# Patient Record
Sex: Female | Born: 1935 | Race: Black or African American | Hispanic: No | Smoking: Never smoker
Health system: Southern US, Community
[De-identification: ages and names within clinical notes are randomized; demographics above are authoritative.]

## PROBLEM LIST (undated history)

## (undated) DIAGNOSIS — M1990 Unspecified osteoarthritis, unspecified site: Secondary | ICD-10-CM

## (undated) DIAGNOSIS — I10 Essential (primary) hypertension: Secondary | ICD-10-CM

## (undated) DIAGNOSIS — I951 Orthostatic hypotension: Secondary | ICD-10-CM

## (undated) DIAGNOSIS — C50912 Malignant neoplasm of unspecified site of left female breast: Secondary | ICD-10-CM

## (undated) DIAGNOSIS — E538 Deficiency of other specified B group vitamins: Secondary | ICD-10-CM

## (undated) DIAGNOSIS — N183 Chronic kidney disease, stage 3 unspecified: Secondary | ICD-10-CM

## (undated) DIAGNOSIS — D510 Vitamin B12 deficiency anemia due to intrinsic factor deficiency: Secondary | ICD-10-CM

## (undated) DIAGNOSIS — K2940 Chronic atrophic gastritis without bleeding: Secondary | ICD-10-CM

## (undated) DIAGNOSIS — K219 Gastro-esophageal reflux disease without esophagitis: Secondary | ICD-10-CM

## (undated) DIAGNOSIS — K5730 Diverticulosis of large intestine without perforation or abscess without bleeding: Secondary | ICD-10-CM

## (undated) DIAGNOSIS — E739 Lactose intolerance, unspecified: Secondary | ICD-10-CM

## (undated) DIAGNOSIS — R51 Headache: Secondary | ICD-10-CM

## (undated) DIAGNOSIS — R519 Headache, unspecified: Secondary | ICD-10-CM

## (undated) HISTORY — DX: Chronic atrophic gastritis without bleeding: K29.40

## (undated) HISTORY — DX: Essential (primary) hypertension: I10

## (undated) HISTORY — DX: Vitamin B12 deficiency anemia due to intrinsic factor deficiency: D51.0

## (undated) HISTORY — DX: Unspecified osteoarthritis, unspecified site: M19.90

## (undated) HISTORY — DX: Lactose intolerance, unspecified: E73.9

## (undated) HISTORY — DX: Diverticulosis of large intestine without perforation or abscess without bleeding: K57.30

## (undated) HISTORY — PX: CATARACT EXTRACTION W/ INTRAOCULAR LENS  IMPLANT, BILATERAL: SHX1307

## (undated) HISTORY — DX: Orthostatic hypotension: I95.1

---

## 1987-06-26 DIAGNOSIS — C50912 Malignant neoplasm of unspecified site of left female breast: Secondary | ICD-10-CM

## 1987-06-26 HISTORY — PX: MASTECTOMY: SHX3

## 1987-06-26 HISTORY — DX: Malignant neoplasm of unspecified site of left female breast: C50.912

## 2004-05-16 ENCOUNTER — Ambulatory Visit: Payer: Self-pay | Admitting: Family Medicine

## 2005-05-24 ENCOUNTER — Ambulatory Visit: Payer: Self-pay | Admitting: Family Medicine

## 2005-07-02 ENCOUNTER — Ambulatory Visit: Payer: Self-pay | Admitting: Cardiology

## 2005-07-03 ENCOUNTER — Inpatient Hospital Stay (HOSPITAL_COMMUNITY): Admission: EM | Admit: 2005-07-03 | Discharge: 2005-07-05 | Payer: Self-pay | Admitting: Emergency Medicine

## 2005-07-03 ENCOUNTER — Ambulatory Visit: Payer: Self-pay | Admitting: Internal Medicine

## 2005-07-04 ENCOUNTER — Encounter: Payer: Self-pay | Admitting: Cardiology

## 2005-07-13 ENCOUNTER — Ambulatory Visit: Payer: Self-pay | Admitting: Family Medicine

## 2005-08-14 ENCOUNTER — Ambulatory Visit: Payer: Self-pay | Admitting: Family Medicine

## 2006-01-08 ENCOUNTER — Ambulatory Visit: Payer: Self-pay | Admitting: Family Medicine

## 2006-02-27 ENCOUNTER — Ambulatory Visit: Payer: Self-pay | Admitting: Family Medicine

## 2006-03-14 ENCOUNTER — Ambulatory Visit: Payer: Self-pay | Admitting: Family Medicine

## 2006-03-26 ENCOUNTER — Ambulatory Visit: Payer: Self-pay | Admitting: Family Medicine

## 2006-05-06 ENCOUNTER — Ambulatory Visit: Payer: Self-pay | Admitting: Family Medicine

## 2006-05-06 LAB — CONVERTED CEMR LAB
ALT: 14 units/L (ref 0–40)
AST: 25 units/L (ref 0–37)
BUN: 21 mg/dL (ref 6–23)
Basophils Absolute: 0 10*3/uL (ref 0.0–0.1)
Basophils Relative: 0.1 % (ref 0.0–1.0)
Creatinine, Ser: 1.2 mg/dL (ref 0.4–1.2)
Eosinophil percent: 0 % (ref 0.0–5.0)
Glucose, Bld: 95 mg/dL (ref 70–99)
HCT: 36.5 % (ref 36.0–46.0)
Hemoglobin: 11.9 g/dL — ABNORMAL LOW (ref 12.0–15.0)
Lymphocytes Relative: 25.8 % (ref 12.0–46.0)
MCHC: 32.6 g/dL (ref 30.0–36.0)
MCV: 96.2 fL (ref 78.0–100.0)
Monocytes Absolute: 0.7 10*3/uL (ref 0.2–0.7)
Monocytes Relative: 11.7 % — ABNORMAL HIGH (ref 3.0–11.0)
Neutro Abs: 3.5 10*3/uL (ref 1.4–7.7)
Neutrophils Relative %: 62.4 % (ref 43.0–77.0)
Platelets: 329 10*3/uL (ref 150–400)
Potassium: 3.8 meq/L (ref 3.5–5.1)
RBC: 3.79 M/uL — ABNORMAL LOW (ref 3.87–5.11)
RDW: 13.2 % (ref 11.5–14.6)
TSH: 4.25 microintl units/mL (ref 0.35–5.50)
WBC: 5.7 10*3/uL (ref 4.5–10.5)

## 2007-02-26 DIAGNOSIS — K5730 Diverticulosis of large intestine without perforation or abscess without bleeding: Secondary | ICD-10-CM | POA: Insufficient documentation

## 2007-03-18 ENCOUNTER — Ambulatory Visit: Payer: Self-pay | Admitting: Family Medicine

## 2007-05-12 ENCOUNTER — Telehealth: Payer: Self-pay | Admitting: Family Medicine

## 2007-05-12 ENCOUNTER — Ambulatory Visit: Payer: Self-pay | Admitting: Family Medicine

## 2007-05-12 DIAGNOSIS — M1990 Unspecified osteoarthritis, unspecified site: Secondary | ICD-10-CM | POA: Insufficient documentation

## 2007-05-12 LAB — CONVERTED CEMR LAB
ALT: 12 units/L (ref 0–35)
AST: 18 units/L (ref 0–37)
Albumin: 3.6 g/dL (ref 3.5–5.2)
Alkaline Phosphatase: 72 units/L (ref 39–117)
BUN: 19 mg/dL (ref 6–23)
Basophils Absolute: 0 10*3/uL (ref 0.0–0.1)
Basophils Relative: 0 % (ref 0.0–1.0)
Bilirubin Urine: NEGATIVE
Bilirubin, Direct: 0.1 mg/dL (ref 0.0–0.3)
CO2: 30 meq/L (ref 19–32)
Calcium: 9.6 mg/dL (ref 8.4–10.5)
Chloride: 106 meq/L (ref 96–112)
Creatinine, Ser: 1 mg/dL (ref 0.4–1.2)
Eosinophils Absolute: 0 10*3/uL (ref 0.0–0.6)
Eosinophils Relative: 0.1 % (ref 0.0–5.0)
GFR calc Af Amer: 68 mL/min
GFR calc non Af Amer: 57 mL/min
Glucose, Bld: 107 mg/dL — ABNORMAL HIGH (ref 70–99)
Glucose, Urine, Semiquant: NEGATIVE
HCT: 33.6 % — ABNORMAL LOW (ref 36.0–46.0)
Hemoglobin: 11.6 g/dL — ABNORMAL LOW (ref 12.0–15.0)
Lymphocytes Relative: 31.2 % (ref 12.0–46.0)
MCHC: 34.6 g/dL (ref 30.0–36.0)
MCV: 95 fL (ref 78.0–100.0)
Monocytes Absolute: 0.6 10*3/uL (ref 0.2–0.7)
Monocytes Relative: 10.1 % (ref 3.0–11.0)
Neutro Abs: 3.4 10*3/uL (ref 1.4–7.7)
Neutrophils Relative %: 58.6 % (ref 43.0–77.0)
Nitrite: POSITIVE
Platelets: 265 10*3/uL (ref 150–400)
Potassium: 4.6 meq/L (ref 3.5–5.1)
RBC: 3.54 M/uL — ABNORMAL LOW (ref 3.87–5.11)
RDW: 13.6 % (ref 11.5–14.6)
Sodium: 142 meq/L (ref 135–145)
Specific Gravity, Urine: 1.025
TSH: 4.3 microintl units/mL (ref 0.35–5.50)
Total Bilirubin: 0.8 mg/dL (ref 0.3–1.2)
Total Protein: 6.6 g/dL (ref 6.0–8.3)
Urobilinogen, UA: 0.2
WBC: 5.8 10*3/uL (ref 4.5–10.5)
pH: 5.5

## 2007-10-10 ENCOUNTER — Ambulatory Visit: Payer: Self-pay | Admitting: Family Medicine

## 2007-10-10 LAB — CONVERTED CEMR LAB
Bilirubin Urine: NEGATIVE
Glucose, Urine, Semiquant: NEGATIVE
Ketones, urine, test strip: NEGATIVE
Nitrite: POSITIVE
Protein, U semiquant: 30
Specific Gravity, Urine: 1.02
Urobilinogen, UA: NEGATIVE
pH: 6

## 2007-10-31 ENCOUNTER — Ambulatory Visit: Payer: Self-pay | Admitting: Family Medicine

## 2007-11-28 ENCOUNTER — Ambulatory Visit: Payer: Self-pay | Admitting: Family Medicine

## 2007-11-28 LAB — CONVERTED CEMR LAB
Blood in Urine, dipstick: NEGATIVE
Ketones, urine, test strip: NEGATIVE
Nitrite: NEGATIVE
Specific Gravity, Urine: 1.015
Urobilinogen, UA: 0.2
WBC Urine, dipstick: NEGATIVE
pH: 6

## 2008-04-22 ENCOUNTER — Encounter: Payer: Self-pay | Admitting: Family Medicine

## 2008-05-10 ENCOUNTER — Encounter: Payer: Self-pay | Admitting: Family Medicine

## 2008-05-11 ENCOUNTER — Ambulatory Visit: Payer: Self-pay | Admitting: Family Medicine

## 2008-05-14 ENCOUNTER — Ambulatory Visit: Payer: Self-pay | Admitting: Family Medicine

## 2008-05-14 LAB — CONVERTED CEMR LAB: Ferritin: 479.8 ng/mL — ABNORMAL HIGH (ref 10.0–291.0)

## 2008-05-15 ENCOUNTER — Encounter: Payer: Self-pay | Admitting: Family Medicine

## 2008-05-17 LAB — CONVERTED CEMR LAB
Saturation Ratios: 21 % (ref 20–55)
TIBC: 236 ug/dL — ABNORMAL LOW (ref 250–470)
UIBC: 187 ug/dL

## 2008-05-18 ENCOUNTER — Ambulatory Visit: Payer: Self-pay | Admitting: Family Medicine

## 2008-05-18 DIAGNOSIS — D518 Other vitamin B12 deficiency anemias: Secondary | ICD-10-CM | POA: Insufficient documentation

## 2008-05-24 ENCOUNTER — Telehealth: Payer: Self-pay | Admitting: *Deleted

## 2008-05-26 ENCOUNTER — Ambulatory Visit: Payer: Self-pay | Admitting: Family Medicine

## 2008-06-02 ENCOUNTER — Ambulatory Visit: Payer: Self-pay | Admitting: Family Medicine

## 2008-07-01 ENCOUNTER — Ambulatory Visit: Payer: Self-pay | Admitting: Family Medicine

## 2008-07-29 ENCOUNTER — Ambulatory Visit: Payer: Self-pay | Admitting: Family Medicine

## 2008-09-08 ENCOUNTER — Ambulatory Visit: Payer: Self-pay | Admitting: Family Medicine

## 2008-10-14 ENCOUNTER — Ambulatory Visit: Payer: Self-pay | Admitting: Family Medicine

## 2008-11-15 ENCOUNTER — Ambulatory Visit: Payer: Self-pay | Admitting: Family Medicine

## 2008-12-20 ENCOUNTER — Ambulatory Visit: Payer: Self-pay | Admitting: Family Medicine

## 2009-01-19 ENCOUNTER — Ambulatory Visit: Payer: Self-pay | Admitting: Family Medicine

## 2009-01-31 ENCOUNTER — Ambulatory Visit: Payer: Self-pay | Admitting: Family Medicine

## 2009-02-16 ENCOUNTER — Ambulatory Visit: Payer: Self-pay | Admitting: Family Medicine

## 2009-02-22 ENCOUNTER — Ambulatory Visit: Payer: Self-pay | Admitting: Family Medicine

## 2009-03-18 ENCOUNTER — Ambulatory Visit: Payer: Self-pay | Admitting: Family Medicine

## 2009-04-25 ENCOUNTER — Ambulatory Visit: Payer: Self-pay | Admitting: Family Medicine

## 2009-05-18 ENCOUNTER — Encounter: Payer: Self-pay | Admitting: *Deleted

## 2009-05-26 ENCOUNTER — Ambulatory Visit: Payer: Self-pay | Admitting: Family Medicine

## 2009-06-14 ENCOUNTER — Ambulatory Visit: Payer: Self-pay | Admitting: Family Medicine

## 2009-06-14 LAB — CONVERTED CEMR LAB
Bilirubin Urine: NEGATIVE
Nitrite: POSITIVE
Specific Gravity, Urine: 1.025

## 2009-06-15 LAB — CONVERTED CEMR LAB
ALT: 14 units/L (ref 0–35)
AST: 21 units/L (ref 0–37)
Alkaline Phosphatase: 64 units/L (ref 39–117)
Basophils Relative: 0.1 % (ref 0.0–3.0)
Bilirubin, Direct: 0.1 mg/dL (ref 0.0–0.3)
Chloride: 106 meq/L (ref 96–112)
Creatinine, Ser: 1.1 mg/dL (ref 0.4–1.2)
Eosinophils Relative: 0 % (ref 0.0–5.0)
GFR calc non Af Amer: 60.85 mL/min (ref >60)
Lymphocytes Relative: 31.7 % (ref 12.0–46.0)
MCV: 93.8 fL (ref 78.0–100.0)
Monocytes Absolute: 0.6 10*3/uL (ref 0.1–1.0)
Monocytes Relative: 10.7 % (ref 3.0–12.0)
Neutrophils Relative %: 57.5 % (ref 43.0–77.0)
Platelets: 231 10*3/uL (ref 150.0–400.0)
RBC: 3.67 M/uL — ABNORMAL LOW (ref 3.87–5.11)
Saturation Ratios: 28 % (ref 20.0–50.0)
Total Bilirubin: 1 mg/dL (ref 0.3–1.2)
Total Protein: 6.8 g/dL (ref 6.0–8.3)
Transferrin: 186.1 mg/dL — ABNORMAL LOW (ref 212.0–360.0)
WBC: 5.9 10*3/uL (ref 4.5–10.5)

## 2009-06-27 ENCOUNTER — Ambulatory Visit: Payer: Self-pay | Admitting: Family Medicine

## 2009-07-28 ENCOUNTER — Ambulatory Visit: Payer: Self-pay | Admitting: Family Medicine

## 2009-08-25 ENCOUNTER — Ambulatory Visit: Payer: Self-pay | Admitting: Family Medicine

## 2009-09-26 ENCOUNTER — Ambulatory Visit: Payer: Self-pay | Admitting: Family Medicine

## 2009-10-18 ENCOUNTER — Ambulatory Visit: Payer: Self-pay | Admitting: Family Medicine

## 2009-10-24 ENCOUNTER — Encounter: Payer: Self-pay | Admitting: Family Medicine

## 2009-10-24 ENCOUNTER — Encounter: Payer: Self-pay | Admitting: *Deleted

## 2009-11-16 ENCOUNTER — Encounter: Payer: Self-pay | Admitting: *Deleted

## 2009-11-16 LAB — HM MAMMOGRAPHY

## 2009-11-18 ENCOUNTER — Ambulatory Visit: Payer: Self-pay | Admitting: Family Medicine

## 2009-12-16 ENCOUNTER — Ambulatory Visit: Payer: Self-pay | Admitting: Family Medicine

## 2010-01-13 ENCOUNTER — Ambulatory Visit: Payer: Self-pay | Admitting: Family Medicine

## 2010-02-09 ENCOUNTER — Ambulatory Visit: Payer: Self-pay | Admitting: Family Medicine

## 2010-03-14 ENCOUNTER — Ambulatory Visit: Payer: Self-pay | Admitting: Family Medicine

## 2010-04-11 ENCOUNTER — Ambulatory Visit: Payer: Self-pay | Admitting: Family Medicine

## 2010-04-11 DIAGNOSIS — L501 Idiopathic urticaria: Secondary | ICD-10-CM | POA: Insufficient documentation

## 2010-04-11 LAB — CONVERTED CEMR LAB
Ketones, urine, test strip: NEGATIVE
Nitrite: POSITIVE
Protein, U semiquant: 30
Specific Gravity, Urine: 1.02
pH: 6

## 2010-05-12 ENCOUNTER — Ambulatory Visit: Payer: Self-pay | Admitting: Family Medicine

## 2010-06-09 ENCOUNTER — Ambulatory Visit: Payer: Self-pay | Admitting: Family Medicine

## 2010-07-11 ENCOUNTER — Encounter: Payer: Self-pay | Admitting: Family Medicine

## 2010-07-11 ENCOUNTER — Ambulatory Visit
Admission: RE | Admit: 2010-07-11 | Discharge: 2010-07-11 | Payer: Self-pay | Source: Home / Self Care | Attending: Family Medicine | Admitting: Family Medicine

## 2010-07-11 ENCOUNTER — Other Ambulatory Visit: Payer: Self-pay | Admitting: Family Medicine

## 2010-07-11 DIAGNOSIS — R1310 Dysphagia, unspecified: Secondary | ICD-10-CM | POA: Insufficient documentation

## 2010-07-11 LAB — CBC WITH DIFFERENTIAL/PLATELET
Basophils Absolute: 0 10*3/uL (ref 0.0–0.1)
Basophils Relative: 0.2 % (ref 0.0–3.0)
Eosinophils Absolute: 0 10*3/uL (ref 0.0–0.7)
Eosinophils Relative: 0 % (ref 0.0–5.0)
HCT: 34.6 % — ABNORMAL LOW (ref 36.0–46.0)
Hemoglobin: 11.9 g/dL — ABNORMAL LOW (ref 12.0–15.0)
Lymphocytes Relative: 30.4 % (ref 12.0–46.0)
Lymphs Abs: 1.5 10*3/uL (ref 0.7–4.0)
MCHC: 34.3 g/dL (ref 30.0–36.0)
MCV: 92 fl (ref 78.0–100.0)
Monocytes Absolute: 0.5 10*3/uL (ref 0.1–1.0)
Monocytes Relative: 10.1 % (ref 3.0–12.0)
Neutro Abs: 2.9 10*3/uL (ref 1.4–7.7)
Neutrophils Relative %: 59.3 % (ref 43.0–77.0)
Platelets: 258 10*3/uL (ref 150.0–400.0)
RBC: 3.76 Mil/uL — ABNORMAL LOW (ref 3.87–5.11)
RDW: 14.5 % (ref 11.5–14.6)
WBC: 4.9 10*3/uL (ref 4.5–10.5)

## 2010-07-11 LAB — BASIC METABOLIC PANEL
BUN: 21 mg/dL (ref 6–23)
CO2: 30 mEq/L (ref 19–32)
Calcium: 9.3 mg/dL (ref 8.4–10.5)
Chloride: 107 mEq/L (ref 96–112)
Creatinine, Ser: 1.2 mg/dL (ref 0.4–1.2)
GFR: 57.66 mL/min — ABNORMAL LOW (ref >60.00)
Glucose, Bld: 78 mg/dL (ref 70–99)
Potassium: 4.1 mEq/L (ref 3.5–5.1)
Sodium: 145 mEq/L (ref 135–145)

## 2010-07-11 LAB — CONVERTED CEMR LAB
Bilirubin Urine: NEGATIVE
Glucose, Urine, Semiquant: NEGATIVE
Urobilinogen, UA: 0.2
pH: 5

## 2010-07-11 LAB — HEPATIC FUNCTION PANEL
ALT: 15 U/L (ref 0–35)
AST: 20 U/L (ref 0–37)
Albumin: 3.6 g/dL (ref 3.5–5.2)
Alkaline Phosphatase: 77 U/L (ref 39–117)
Bilirubin, Direct: 0.1 mg/dL (ref 0.0–0.3)
Total Bilirubin: 0.9 mg/dL (ref 0.3–1.2)
Total Protein: 6.5 g/dL (ref 6.0–8.3)

## 2010-07-11 LAB — B12 AND FOLATE PANEL
Folate: >24.8 ng/mL (ref >5.9)
Vitamin B-12: >1500 pg/mL — ABNORMAL HIGH (ref 211–911)

## 2010-07-11 LAB — TSH: TSH: 4.91 u[IU]/mL (ref 0.35–5.50)

## 2010-07-12 ENCOUNTER — Encounter: Payer: Self-pay | Admitting: Gastroenterology

## 2010-07-12 ENCOUNTER — Telehealth: Payer: Self-pay | Admitting: Family Medicine

## 2010-07-17 ENCOUNTER — Ambulatory Visit
Admission: RE | Admit: 2010-07-17 | Discharge: 2010-07-17 | Payer: Self-pay | Source: Home / Self Care | Attending: Gastroenterology | Admitting: Gastroenterology

## 2010-07-17 ENCOUNTER — Encounter: Payer: Self-pay | Admitting: Gastroenterology

## 2010-07-17 DIAGNOSIS — H409 Unspecified glaucoma: Secondary | ICD-10-CM | POA: Insufficient documentation

## 2010-07-17 DIAGNOSIS — R1319 Other dysphagia: Secondary | ICD-10-CM | POA: Insufficient documentation

## 2010-07-17 DIAGNOSIS — K219 Gastro-esophageal reflux disease without esophagitis: Secondary | ICD-10-CM | POA: Insufficient documentation

## 2010-07-23 LAB — CONVERTED CEMR LAB
ALT: 12 units/L (ref 0–35)
Albumin: 3.5 g/dL (ref 3.5–5.2)
Basophils Absolute: 0 10*3/uL (ref 0.0–0.1)
Basophils Relative: 0.1 % (ref 0.0–3.0)
Bilirubin, Direct: 0.1 mg/dL (ref 0.0–0.3)
Calcium: 8.9 mg/dL (ref 8.4–10.5)
Creatinine, Ser: 1.2 mg/dL (ref 0.4–1.2)
Eosinophils Absolute: 0 10*3/uL (ref 0.0–0.7)
GFR calc Af Amer: 55 mL/min
GFR calc non Af Amer: 46 mL/min
Hemoglobin: 10.7 g/dL — ABNORMAL LOW (ref 12.0–15.0)
MCHC: 33.6 g/dL (ref 30.0–36.0)
MCV: 95.6 fL (ref 78.0–100.0)
Monocytes Absolute: 0.5 10*3/uL (ref 0.1–1.0)
Neutro Abs: 2.8 10*3/uL (ref 1.4–7.7)
Nitrite: NEGATIVE
RBC: 3.32 M/uL — ABNORMAL LOW (ref 3.87–5.11)
RDW: 14.7 % — ABNORMAL HIGH (ref 11.5–14.6)
Sodium: 144 meq/L (ref 135–145)
Specific Gravity, Urine: 1.025
Total Bilirubin: 1 mg/dL (ref 0.3–1.2)
Urobilinogen, UA: 0.2

## 2010-07-26 ENCOUNTER — Encounter (AMBULATORY_SURGERY_CENTER): Payer: Medicare Other | Admitting: Gastroenterology

## 2010-07-26 ENCOUNTER — Encounter: Payer: Self-pay | Admitting: Gastroenterology

## 2010-07-26 DIAGNOSIS — K219 Gastro-esophageal reflux disease without esophagitis: Secondary | ICD-10-CM

## 2010-07-26 DIAGNOSIS — K2940 Chronic atrophic gastritis without bleeding: Secondary | ICD-10-CM

## 2010-07-26 DIAGNOSIS — R1319 Other dysphagia: Secondary | ICD-10-CM

## 2010-07-27 NOTE — Progress Notes (Signed)
Summary: b12 rx  Phone Note Refill Request Message from:  Patient  Refills Requested: Medication #1:  CYANOCOBALAMIN 1000 MCG/ML SOLN 1 cc once a month    Prescriptions: CYANOCOBALAMIN 1000 MCG/ML SOLN (CYANOCOBALAMIN) 1 cc once a month  #30 cc x 3   Entered by:   Kern Reap CMA (AAMA)   Authorized by:   Roderick Pee MD   Signed by:   Kern Reap CMA (AAMA) on 07/12/2010   Method used:   Electronically to        [facility] [phone]* (retail)       [address]       [place], [place]  [address]       Ph: [phone]       Fax: [phone]   RxID:   [id number]

## 2010-07-27 NOTE — Assessment & Plan Note (Signed)
Summary: B-12 INJ/CJR  Nurse Visit   Allergies: No Known Drug Allergies  Medication Administration  Injection # 1:    Medication: Vit B12 1000 mcg    Diagnosis: ANEMIA, B12 DEFICIENCY (ICD-281.1)    Route: IM    Site: RUOQ gluteus    Exp Date: 07/27/2011    Lot #: [id number]    Mfr: American Regent    Patient tolerated injection without complications    Given by: Kern Reap CMA Duncan Dull) (January 13, 2010 12:14 PM)  Orders Added: 1)  Vit B12 1000 mcg [J3420] 2)  Admin of Therapeutic Inj  intramuscular or subcutaneous [[address]]

## 2010-07-27 NOTE — Assessment & Plan Note (Signed)
Summary: B-12INJ/PT WILL COME IN @11 :00AM/RCD  Nurse Visit   Allergies: No Known Drug Allergies  Medication Administration  Injection # 1:    Medication: Vit B12 1000 mcg    Diagnosis: ANEMIA, B12 DEFICIENCY (ICD-281.1)    Route: IM    Site: RUOQ gluteus    Exp Date: 0647    Lot #: 02/24/11    Mfr: American Regent    Patient tolerated injection without complications    Given by: Kern Reap CMA (AAMA) (Nov 18, 2009 12:30 PM)  Orders Added: 1)  Vit B12 1000 mcg [J3420] 2)  Admin of Therapeutic Inj  intramuscular or subcutaneous [[address]]

## 2010-07-27 NOTE — Assessment & Plan Note (Signed)
Summary: B-12//ALP pt will come in around 11am/njr  Nurse Visit   Allergies: No Known Drug Allergies  Medication Administration  Injection # 1:    Medication: Vit B12 1000 mcg    Diagnosis: ANEMIA, B12 DEFICIENCY (ICD-281.1)    Route: IM    Site: RUOQ gluteus    Exp Date: 02/24/2011    Lot #: [id number]    Mfr: American Regent    Patient tolerated injection without complications    Given by: Kern Reap CMA (AAMA) (September 26, 2009 12:05 PM)  Orders Added: 1)  Vit B12 1000 mcg [J3420] 2)  Admin of Therapeutic Inj  intramuscular or subcutaneous [82956]

## 2010-07-27 NOTE — Assessment & Plan Note (Signed)
Summary: EPISODES OF FOOD GETTING STUCK IN ESOPHAGUS/YF   History of Present Illness Visit Type: Initial Consult Primary GI MD: Elie Goody MD [facility] Primary Provider: Kelle Darting, MD Requesting Provider: Suzzanne Cloud, MD Chief Complaint: dysphagia,to solids and liquids x 1 year, more regular in the past 2-3 months History of Present Illness:   This is an 77 year old female here today with her daughter, who relates a one-year history of postprandial regurgitation, and a vague description of solid and liquid dysphasia that is associated with regurgitation. Her symptoms have worsened over the past 2-3 months. She relates she had heartburn in the past, but none recently. She takes ranitidine on an as-needed basis. She notes about 10 pound weight loss over the past year.   GI Review of Systems    Reports acid reflux, belching, dysphagia with liquids, dysphagia with solids, and  weight loss.   Weight loss of 10 pounds   Denies abdominal pain, bloating, chest pain, heartburn, loss of appetite, nausea, vomiting, vomiting blood, and  weight gain.      Reports diverticulosis.     Denies anal fissure, black tarry stools, change in bowel habit, constipation, diarrhea, fecal incontinence, heme positive stool, hemorrhoids, irritable bowel syndrome, jaundice, light color stool, liver problems, rectal bleeding, and  rectal pain.   Current Medications (verified): 1)  Multivitamins   Tabs (Multiple Vitamin) .... Once Daily 2)  Ranitidine Hcl 150 Mg  Caps (Ranitidine Hcl) .... Prn 3)  Travatan Z 0.004 % Soln (Travoprost) .... One Drop in Each Eye Once Daily 4)  Cyanocobalamin 1000 Mcg/ml Soln (Cyanocobalamin) .Marland Kitchen.. 1 Cc Once A Month 5)  Iron 325 (65 Fe) Mg Tabs (Ferrous Sulfate) .... Take One Tab Once Daily 6)  Calcarb 600 1500 Mg Tabs (Calcium Carbonate) .... Take 1 Tablet By Mouth Once Daily  Allergies (verified): No Known Drug Allergies  Past History:  Past Medical History: Diverticulosis,  colon Hypertension breast cancer, left total mastectomy 1989 Osteoarthritis Glaucoma  Past Surgical History: Reviewed history from 02/26/2007 and no changes required. Mastectomy CB x4  Social History: Retired Married Never Smoked Alcohol use-no Drug use-no Daily Caffeine Use  Review of Systems       The patient complains of allergy/sinus, arthritis/joint pain, back pain, sleeping problems, urination - excessive, and urination changes/pain.         The pertinent positives and negatives are noted as above and in the HPI. All other ROS were reviewed and were negative.  Vital Signs:  Patient profile:   77 year old female Height:      60 inches Weight:      120.13 pounds BMI:     23.55 Pulse rate:   68 / minute Pulse rhythm:   regular BP sitting:   160 / 82  (right arm) Cuff size:   regular  Vitals Entered By: June McMurray CMA Duncan Dull) (July 17, 2010 2:36 PM)  Physical Exam  General:  Well developed, well nourished, no acute distress. Head:  Normocephalic and atraumatic. Eyes:  PERRLA, no icterus. Ears:  Normal auditory acuity. Mouth:  No deformity or lesions, dentition normal. Lungs:  Clear throughout to auscultation. Heart:  Regular rate and rhythm; no murmurs, rubs,  or bruits. Abdomen:  Soft, nontender and nondistended. No masses, hepatosplenomegaly or hernias noted. Normal bowel sounds. Pulses:  Normal pulses noted. Extremities:  No clubbing, cyanosis, edema or deformities noted. Neurologic:  Alert and  oriented x4;  grossly normal neurologically. Cervical Nodes:  No significant cervical adenopathy. Inguinal Nodes:  No significant inguinal adenopathy. Psych:  Alert and cooperative. Normal mood and affect.  Impression & Recommendations:  Problem # 1:  GERD (ICD-530.81) GERD with significant regurgitation and weight loss. Rule out an UGI neoplasm, an esophageal motility disorder or an esophageal stricture. The risks, benefits and alternatives to endoscopy  with possible biopsy and possible dilation were discussed with the patient and they consent to proceed. The procedure will be scheduled electively. Begin omeprazole 20 mg q.a.m., along with standard antireflux measures and discontinued ranitidine. Orders: EGD SAV (EGD SAV)  Problem # 2:  OTHER DYSPHAGIA (ICD-787.29) See above. Vague symptoms could be related to regurgitation. Orders: EGD SAV (EGD SAV)  Problem # 3:  ANEMIA, B12 DEFICIENCY (ICD-281.1) Monthly B12 injections. She is also being treated with iron. Further followup with Dr. Tawanna Cooler.   Patient Instructions: 1)  Stop ranitidine and start omeprazole 20mg  one tablet by mouth once daily that has been sent to your pharmacy.  2)  Upper Endoscopy with Dilatation brochure given.  3)  Copy sent to : Kelle Darting, MD 4)  The medication list was reviewed and reconciled.  All changed / newly prescribed medications were explained.  A complete medication list was provided to the patient / caregiver.  Prescriptions: OMEPRAZOLE 20 MG CPDR (OMEPRAZOLE) one tablet by mouth once daily  #30 x 11   Entered by:   Christie Nottingham CMA (AAMA)   Authorized by:   Meryl Dare MD [facility]   Signed by:   Christie Nottingham CMA Duncan Dull) on 07/17/2010   Method used:   Electronically to        [facility] [phone]* (retail)       [address]       [place], [place]  [address]       Ph: [phone]       Fax: [phone]   RxID:   [id number]

## 2010-07-27 NOTE — Assessment & Plan Note (Signed)
Summary: b12//ccm  Nurse Visit   Allergies: No Known Drug Allergies  Medication Administration  Injection # 1:    Medication: Vit B12 1000 mcg    Diagnosis: ANEMIA, B12 DEFICIENCY (ICD-281.1)    Route: IM    Site: RUOQ gluteus    Exp Date: 07/27/2011    Lot #: [id number]    Mfr: American Regent    Patient tolerated injection without complications    Given by: Kern Reap CMA (AAMA) (February 09, 2010 10:21 AM)  Orders Added: 1)  Vit B12 1000 mcg [J3420] 2)  Admin of Therapeutic Inj  intramuscular or subcutaneous [[id number]]

## 2010-07-27 NOTE — Assessment & Plan Note (Signed)
Summary: b12 inj//ccm  Nurse Visit   Allergies: No Known Drug Allergies  Medication Administration  Injection # 1:    Medication: Vit B12 1000 mcg    Diagnosis: ANEMIA, B12 DEFICIENCY (ICD-281.1)    Route: IM    Site: RUOQ gluteus    Exp Date: 12/24/2011    Lot #: [id number]    Mfr: American Regent    Patient tolerated injection without complications    Given by: Kern Reap CMA (AAMA) (June 09, 2010 11:07 AM)  Orders Added: 1)  Vit B12 1000 mcg [J3420] 2)  Admin of Therapeutic Inj  intramuscular or subcutaneous [62130]

## 2010-07-27 NOTE — Assessment & Plan Note (Signed)
Summary: B-12 INJ/CJR  PT COMING IN AT 11:15AM  Nurse Visit   Allergies: No Known Drug Allergies  Medication Administration  Injection # 1:    Medication: Vit B12 1000 mcg    Diagnosis: ANEMIA, B12 DEFICIENCY (ICD-281.1)    Route: IM    Site: R deltoid    Exp Date: 01/24/2011    Lot #: [id number]    Mfr: American Regent    Patient tolerated injection without complications    Given by: Kern Reap CMA (AAMA) (July 28, 2009 11:11 AM)  Orders Added: 1)  Vit B12 1000 mcg [J3420] 2)  Admin of Therapeutic Inj  intramuscular or subcutaneous [46962]

## 2010-07-27 NOTE — Assessment & Plan Note (Signed)
Summary: CPX/PT FASTING/RCD/pt rescd from bump//ccm also b12 inj/njr   Vital Signs:  Patient profile:   77 year old female Height:      60.25 inches Weight:      120 pounds BMI:     23.33 Temp:     98.1 degrees F oral BP sitting:   124 / 80  (left arm) Cuff size:   regular  Vitals Entered By: Kern Reap CMA Duncan Dull) (July 11, 2010 9:40 AM) CC: wellness exam Is Patient Diabetic? No Pain Assessment Patient in pain? no        CC:  wellness exam.  History of Present Illness: Veronica Phillips is an 77 year old, married female, nonsmoker, who comes in today for Medicare wellness examination.  She's always been in excellent, health.  She's had no chronic health problems except for vitamin B12 deficiency, and some glaucoma.  Review of systems negative except for the last, year.  She's had episodes her she said difficulty swallowing food gets stuck in her lower esophagus, and she said her throw up to get rid of it.  Advised a soft diet, and we will get a GI consult.  Tetanus 2009, seasonal flu 2011, Pneumovax 2005 x 2, information given on shingles. Here for Medicare AWV:  1.   Risk factors based on Past M, S, F history:......reviewed no changes except for dysphasia 2.   Physical Activities: sedentary 3.   Depression/mood: good mood.  No depression 4.   Hearing: normal 5.   ADL's: functions independently 6.   Fall Risk: reviewed the none identified 7.   Home Safety: no guns in the house 8.   Height, weight, &visual acuity:height weight, normal.  Vision normal 9.   Counseling: a soft diet.  GI consult 10.   Labs ordered based on risk factors: done today 11.           Referral Coordination...GI consult 12.           Care Plan.........continue current medication 13.            Cognitive Assessment ........daughter has healthcare and financial power-of-attorney  Allergies (verified): No Known Drug Allergies  Past History:  Past medical, surgical, family and social histories  (including risk factors) reviewed, and no changes noted (except as noted below).  Past Medical History: Reviewed history from 05/12/2007 and no changes required. Diverticulosis, colon Hypertension breast cancer, left total mastectomy 1989 Osteoarthritis  Past Surgical History: Reviewed history from 02/26/2007 and no changes required. Mastectomy CB x4  Family History: Reviewed history from 02/26/2007 and no changes required. Family History Hypertension Family History Lung cancer Family History of Stroke M 1st degree relative <50 Family History of Cardiovascular disorder  Social History: Reviewed history from 02/26/2007 and no changes required. Retired Married Never Smoked Alcohol use-no Drug use-no  Review of Systems      See HPI  Physical Exam  General:  Well-developed,well-nourished,in no acute distress; alert,appropriate and cooperative throughout examination Head:  Normocephalic and atraumatic without obvious abnormalities. No apparent alopecia or balding. Eyes:  No corneal or conjunctival inflammation noted. EOMI. Perrla. Funduscopic exam benign, without hemorrhages, exudates or papilledema. Vision grossly normal. Ears:  External ear exam shows no significant lesions or deformities.  Otoscopic examination reveals clear canals, tympanic membranes are intact bilaterally without bulging, retraction, inflammation or discharge. Hearing is grossly normal bilaterally. Nose:  External nasal examination shows no deformity or inflammation. Nasal mucosa are pink and moist without lesions or exudates. Mouth:  Oral mucosa and oropharynx without lesions or  exudates.  Teeth in good repair. Neck:  No deformities, masses, or tenderness noted. Chest Wall:  No deformities, masses, or tenderness noted. Breasts:  right breast normal scar left from a mastectomy.  No recurrence Lungs:  Normal respiratory effort, chest expands symmetrically. Lungs are clear to auscultation, no crackles or  wheezes. Heart:  Normal rate and regular rhythm. S1 and S2 normal without gallop, murmur, click, rub or other extra sounds. Abdomen:  Bowel sounds positive,abdomen soft and non-tender without masses, organomegaly or hernias noted. Msk:  No deformity or scoliosis noted of thoracic or lumbar spine.   Pulses:  R and L carotid,radial,femoral,dorsalis pedis and posterior tibial pulses are full and equal bilaterally Extremities:  No clubbing, cyanosis, edema, or deformity noted with normal full range of motion of all joints.   Neurologic:  No cranial nerve deficits noted. Station and gait are normal. Plantar reflexes are down-going bilaterally. DTRs are symmetrical throughout. Sensory, motor and coordinative functions appear intact. Skin:  vitiligo Cervical Nodes:  No lymphadenopathy noted Axillary Nodes:  No palpable lymphadenopathy Inguinal Nodes:  No significant adenopathy Psych:  Cognition and judgment appear intact. Alert and cooperative with normal attention span and concentration. No apparent delusions, illusions, hallucinations   Impression & Recommendations:  Problem # 1:  ANEMIA, B12 DEFICIENCY (ICD-281.1) Assessment Improved  Her updated medication list for this problem includes:    Cyanocobalamin 1000 Mcg/ml Soln (Cyanocobalamin) .Marland Kitchen... 1 cc once a month    Iron 325 (65 Fe) Mg Tabs (Ferrous sulfate) .Marland Kitchen... Take one tab once daily  Orders: Prescription Created Electronically [phone]) Medicare -1st Annual Wellness Visit [phone]) Urinalysis-dipstick only (Medicare patient) [phone]) EKG w/ Interpretation (93000) Vit B12 1000 mcg (J3420) Admin of Therapeutic Inj  intramuscular or subcutaneous (62130)  Problem # 2:  HEALTH SCREENING (ICD-V70.0) Assessment: Unchanged  Orders: Venipuncture (86578) Prescription Created Electronically [phone]) Medicare -1st Annual Wellness Visit [phone]) Urinalysis-dipstick only (Medicare patient) (13244WN) EKG w/ Interpretation (93000) Specimen  Handling (02725) TLB-BMP (Basic Metabolic Panel-BMET) (80048-METABOL) TLB-CBC Platelet - w/Differential (85025-CBCD) TLB-Hepatic/Liver Function Pnl (80076-HEPATIC) TLB-TSH (Thyroid Stimulating Hormone) (84443-TSH) TLB-B12 + Folate Pnl (36644_03474-Q59/DGL)  Complete Medication List: 1)  Multivitamins Tabs (Multiple vitamin) .... Once daily 2)  Ranitidine Hcl 150 Mg Caps (Ranitidine hcl) .... Prn 3)  Travatan Z 0.004 % Soln (Travoprost) .... One drop in each eye once daily 4)  Cyanocobalamin 1000 Mcg/ml Soln (Cyanocobalamin) .Marland Kitchen.. 1 cc once a month 5)  Iron 325 (65 Fe) Mg Tabs (Ferrous sulfate) .... Take one tab once daily  Other Orders: Gastroenterology Referral (GI)  Patient Instructions: 1)  stay on a soft diet be sure to drink lots of water when you eat.  We will give to set up for a GI consult 2)  Please schedule a follow-up appointment in 1 year. Prescriptions: CYANOCOBALAMIN 1000 MCG/ML SOLN (CYANOCOBALAMIN) 1 cc once a month  #30 cc x 1   Entered and Authorized by:   Roderick Pee MD   Signed by:   Roderick Pee MD on 07/11/2010   Method used:   Electronically to        [facility]  [address] [phone]* (retail)       [address]       [place], [place]  [address]       Ph: [phone]       Fax: [phone]   RxID:   [id number]    Medication Administration  Injection # 1:  Medication: Vit B12 1000 mcg    Diagnosis: ANEMIA, B12 DEFICIENCY (ICD-281.1)    Route: IM    Site: RUOQ gluteus    Exp Date: 03/25/2012    Lot #: [id number]    Mfr: American Regent    Patient tolerated injection without complications    Given by: Kern Reap CMA (AAMA) (July 11, 2010 10:45 AM)  Orders Added: 1)  Venipuncture [04540] 2)  Prescription Created Electronically [G8553] 3)  Medicare -1st Annual Wellness Visit [G0438] 4)  Urinalysis-dipstick only (Medicare patient) [81003QW] 5)  EKG w/ Interpretation [93000] 6)  Vit B12 1000 mcg [J3420] 7)  Admin of  Therapeutic Inj  intramuscular or subcutaneous [96372] 8)  Specimen Handling [99000] 9)  Gastroenterology Referral [GI] 10)  TLB-BMP (Basic Metabolic Panel-BMET) [80048-METABOL] 11)  TLB-CBC Platelet - w/Differential [85025-CBCD] 12)  TLB-Hepatic/Liver Function Pnl [80076-HEPATIC] 13)  TLB-TSH (Thyroid Stimulating Hormone) [84443-TSH] 14)  TLB-B12 + Folate Pnl [82746_82607-B12/FOL]      Laboratory Results   Urine Tests    Routine Urinalysis   Color: yellow Appearance: Clear Glucose: negative   (Normal Range: Negative) Bilirubin: negative   (Normal Range: Negative) Ketone: 1+   (Normal Range: Negative) Spec. Gravity: 1.025   (Normal Range: 1.003-1.035) Blood: 2+   (Normal Range: Negative) pH: 5.0   (Normal Range: 5.0-8.0) Protein: 1+   (Normal Range: Negative) Urobilinogen: 0.2   (Normal Range: 0-1) Nitrite: negative   (Normal Range: Negative) Leukocyte Esterace: 1+   (Normal Range: Negative)    Comments: Rita Ohara  July 11, 2010 12:03 PM

## 2010-07-27 NOTE — Assessment & Plan Note (Signed)
Summary: B12 inj/mm/pt rescd//ccm  Nurse Visit   Review of Systems       Flu Vaccine Consent Questions     Do you have a history of severe allergic reactions to this vaccine? no    Any prior history of allergic reactions to egg and/or gelatin? no    Do you have a sensitivity to the preservative Thimersol? no    Do you have a past history of Guillan-Barre Syndrome? no    Do you currently have an acute febrile illness? no    Have you ever had a severe reaction to latex? no    Vaccine information given and explained to patient? yes    Are you currently pregnant? no    Lot Number:[id number]   Exp Date:12/23/2010   Site Given  Left Deltoid IM    Allergies: No Known Drug Allergies  Medication Administration  Injection # 1:    Medication: Vit B12 1000 mcg    Diagnosis: ANEMIA, B12 DEFICIENCY (ICD-281.1)    Route: IM    Site: RUOQ gluteus    Exp Date: 12/24/2011    Lot #: [id number]    Mfr: American Regent    Patient tolerated injection without complications    Given by: Kern Reap CMA (AAMA) (March 14, 2010 11:13 AM)  Orders Added: 1)  Flu Vaccine 33yrs + MEDICARE PATIENTS [Q2039] 2)  Administration Flu vaccine - MCR [G0008] 3)  Vit B12 1000 mcg [J3420] 4)  Admin of Therapeutic Inj  intramuscular or subcutaneous [[address]]

## 2010-07-27 NOTE — Letter (Signed)
Summary: EGD Instructions  [name]  [address] [place], [place] [address]   Phone: [phone]  Fax: [phone]       Veronica Phillips    16-Jun-1923    MRN: [id number]       Procedure Day Dorna Bloom: Wednesday February 1st, 2012     Arrival Time: 3:00pm     Procedure Time: 4:00pm     Location of Procedure:                    _ x _ [facility] (4th Floor)    PREPARATION FOR ENDOSCOPY   On 07/26/10 THE DAY OF THE PROCEDURE:  1.   No solid foods, milk or milk products are allowed after midnight the night before your procedure.  2.   Do not drink anything colored red or purple.  Avoid juices with pulp.  No orange juice.  3.  You may drink clear liquids until 2:00pm, which is 2 hours before your procedure.                                                                                                CLEAR LIQUIDS INCLUDE: Water Jello Ice Popsicles Tea (sugar ok, no milk/cream) Powdered fruit flavored drinks Coffee (sugar ok, no milk/cream) Gatorade Juice: apple, white grape, white cranberry  Lemonade Clear bullion, consomm, broth Carbonated beverages (any kind) Strained chicken noodle soup Hard Candy   MEDICATION INSTRUCTIONS  Unless otherwise instructed, you should take regular prescription medications with a small sip of water as early as possible the morning of your procedure.  STOP IRON 5 DAYS BEFORE PROCEDURE!       OTHER INSTRUCTIONS  You will need a responsible adult at least 75 years of age to accompany you and drive you home.   This person must remain in the waiting room during your procedure.  Wear loose fitting clothing that is easily removed.  Leave jewelry and other valuables at home.  However, you may wish to bring a book to read or an iPod/MP3 player to listen to music as you wait for your procedure to start.  Remove all body piercing jewelry and leave at home.  Total time from sign-in until discharge is approximately  2-3 hours.  You should go home directly after your procedure and rest.  You can resume normal activities the day after your procedure.  The day of your procedure you should not:   Drive   Make legal decisions   Operate machinery   Drink alcohol   Return to work  You will receive specific instructions about eating, activities and medications before you leave.    The above instructions have been reviewed and explained to me by   _______________________    I fully understand and can verbalize these instructions _____________________________ Date _________

## 2010-07-27 NOTE — Assessment & Plan Note (Signed)
Summary: B12 INJ//SLM  Nurse Visit   Allergies: No Known Drug Allergies  Medication Administration  Injection # 1:    Medication: Vit B12 1000 mcg    Diagnosis: ANEMIA, B12 DEFICIENCY (ICD-281.1)    Route: IM    Site: RUOQ gluteus    Exp Date: 01/24/2011    Lot #: [id number]    Mfr: American Regent    Patient tolerated injection without complications    Given by: Kern Reap CMA (AAMA) (June 27, 2009 11:44 AM)  Orders Added: 1)  Vit B12 1000 mcg [J3420] 2)  Admin of Therapeutic Inj  intramuscular or subcutaneous [84166]

## 2010-07-27 NOTE — Assessment & Plan Note (Signed)
Summary: b12 inj/njr  Nurse Visit   Allergies: No Known Drug Allergies  Medication Administration  Injection # 1:    Medication: Vit B12 1000 mcg    Diagnosis: ANEMIA, B12 DEFICIENCY (ICD-281.1)    Route: IM    Site: LUOQ gluteus    Exp Date: 02/2011    Lot #: [id number]    Mfr: American Regent    Patient tolerated injection without complications    Given by: Kern Reap CMA (AAMA) (August 25, 2009 11:20 AM)  Orders Added: 1)  Vit B12 1000 mcg [J3420] 2)  Admin of Therapeutic Inj  intramuscular or subcutaneous [96295]

## 2010-07-27 NOTE — Miscellaneous (Signed)
Summary: mammogram results  Clinical Lists Changes  Observations: Added new observation of MAMMOGRAM: abnormal right (05/13/2009 17:08)      Preventive Care Screening  Mammogram:    Date:  05/13/2009    Results:  abnormal right

## 2010-07-27 NOTE — Assessment & Plan Note (Signed)
Summary: B-12//ALP  Nurse Visit   Allergies: No Known Drug Allergies  Appended Document: B-12//ALP     Allergies: No Known Drug Allergies   Complete Medication List: 1)  Atarax  .... As needed 2)  Multivitamins Tabs (Multiple vitamin) .... Once daily 3)  Ranitidine Hcl 150 Mg Caps (Ranitidine hcl) .... Prn 4)  Travatan Z 0.004 % Soln (Travoprost) .... One drop in each eye once daily 5)  Cyanocobalamin 1000 Mcg/ml Soln (Cyanocobalamin) .Marland Kitchen.. 1 cc once a month 6)  Iron 325 (65 Fe) Mg Tabs (Ferrous sulfate) .... Take one tab once daily  Other Orders: Admin of Therapeutic Inj  intramuscular or subcutaneous (13244) Vit B12 1000 mcg (J3420)    Medication Administration  Injection # 1:    Medication: Vit B12 1000 mcg    Diagnosis: ANEMIA, B12 DEFICIENCY (ICD-281.1)    Route: IM    Site: LUOQ gluteus    Exp Date: 09/24/2011    Lot #: [id number]    Mfr: app pharm    Patient tolerated injection without complications    Given by: Kern Reap CMA Duncan Dull) (December 16, 2009 5:38 PM)  Orders Added: 1)  Admin of Therapeutic Inj  intramuscular or subcutaneous [96372] 2)  Vit B12 1000 mcg [J3420]

## 2010-07-27 NOTE — Assessment & Plan Note (Signed)
Summary: b-12inj/rcd  Nurse Visit   Allergies: No Known Drug Allergies  Medication Administration  Injection # 1:    Medication: Vit B12 1000 mcg    Diagnosis: ANEMIA, B12 DEFICIENCY (ICD-281.1)    Route: IM    Site: LUOQ gluteus    Exp Date: 12/24/2011    Lot #: [id number]    Mfr: American Regent    Patient tolerated injection without complications    Given by: Kern Reap CMA (AAMA) (May 12, 2010 11:28 AM)  Orders Added: 1)  Vit B12 1000 mcg [J3420] 2)  Admin of Therapeutic Inj  intramuscular or subcutaneous [[address]]

## 2010-07-27 NOTE — Letter (Signed)
Summary: New Patient letter  [facility]  [address] [place], [place] [address]   Phone: [phone]  Fax: [phone]       07/12/2010 MRN: [id number]  Veronica Phillips [address] [place], [place]  [address]  Dear Ms. Hovsepian,  Welcome to the Gastroenterology Division at [facility].    You are scheduled to see Dr.  Russella Dar on 07-17-10 at 2:30pm on the 3rd floor at [facility], [address]  We ask that you try to arrive at our office 15 minutes prior to your appointment time to allow for check-in.  We would like you to complete the enclosed self-administered evaluation form prior to your visit and bring it with you on the day of your appointment.  We will review it with you.  Also, please bring a complete list of all your medications or, if you prefer, bring the medication bottles and we will list them.  Please bring your insurance card so that we may make a copy of it.  If your insurance requires a referral to see a specialist, please bring your referral form from your primary care physician.  Co-payments are due at the time of your visit and may be paid by cash, check or credit card.     Your office visit will consist of a consult with your physician (includes a physical exam), any laboratory testing he/she may order, scheduling of any necessary diagnostic testing (e.g. x-ray, ultrasound, CT-scan), and scheduling of a procedure (e.g. Endoscopy, Colonoscopy) if required.  Please allow enough time on your schedule to allow for any/all of these possibilities.    If you cannot keep your appointment, please call [phone] to cancel or reschedule prior to your appointment date.  This allows [place] the opportunity to schedule an appointment for another patient in need of care.  If you do not cancel or reschedule by 5 p.m. the business day prior to your appointment date, you will be charged a $50.00 late cancellation/no-show fee.    Thank you for choosing [facility] for your medical needs.  We appreciate the opportunity to care for you.  Please visit [place] at our website  to learn more about our practice.                     Sincerely,                                                             The Gastroenterology Division

## 2010-07-27 NOTE — Assessment & Plan Note (Signed)
Summary: ?uti/b-12 inj/cjr   Vital Signs:  Patient profile:   77 year old female Height:      60.5 inches Weight:      119 pounds BMI:     22.94 Temp:     98.5 degrees F oral BP sitting:   102 / 72  (left arm) Cuff size:   regular  Vitals Entered By: Kern Reap CMA Duncan Dull) (April 11, 2010 12:15 PM) CC: uti, b12 injection Is Patient Diabetic? No   CC:  uti and b12 injection.  History of Present Illness: Veronica Phillips is 77 year old female, who comes in today with a 5 day history of frequency and dysuria.  She said no fever, chills, no back pain.  Last UTI was many years ago.  She also needs a refill on her Atarax for which she takes p.r.n. for hives, etiology unknown  Allergies: No Known Drug Allergies PMH-FH-SH reviewed for relevance  Review of Systems      See HPI  Physical Exam  General:  Well-developed,well-nourished,in no acute distress; alert,appropriate and cooperative throughout examination Abdomen:  Bowel sounds positive,abdomen soft and non-tender without masses, organomegaly or hernias noted.   Impression & Recommendations:  Problem # 1:  DYSURIA (ICD-788.1) Assessment New  Orders: Urinalysis-dipstick only (Medicare patient) ([address]UE)  Her updated medication list for this problem includes:    Septra Ds 800-160 Mg Tabs (Sulfamethoxazole-trimethoprim) .Marland Kitchen... 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 Once daily 2)  Ranitidine Hcl 150 Mg Caps (Ranitidine hcl) .... Prn 3)  Travatan Z 0.004 % Soln (Travoprost) .... One drop in each eye once daily 4)  Cyanocobalamin 1000 Mcg/ml Soln (Cyanocobalamin) .Marland Kitchen.. 1 cc once a month 5)  Iron 325 (65 Fe) Mg Tabs (Ferrous sulfate) .... Take one tab once daily 6)  Septra Ds 800-160 Mg Tabs (Sulfamethoxazole-trimethoprim) .... Take 1 tablet by mouth two times a day 7)  Hydroxyzine Hcl 25  Mg/ml Soln (Hydroxyzine hcl) .... Take 1 tablet by mouth three times a day as needed hives  Other Orders: Vit B12 1000 mcg (J3420) Admin of Therapeutic Inj  intramuscular or subcutaneous ([address])  Patient Instructions: 1)  begin Septra, one twice daily  until  bottle empty. 2)  Drink 20 ounces of water a day. 3)  4 ounces of cranberry juice twice daily. 4)  Return p.r.n. Prescriptions: SEPTRA DS 800-160 MG TABS (SULFAMETHOXAZOLE-TRIMETHOPRIM) Take 1 tablet by mouth two times a day  #20 x 1   Entered and Authorized by:   Roderick Pee MD   Signed by:   Roderick Pee MD on 04/11/2010   Method used:   Electronically to        [facility] [phone]* (retail)       [address]       [place], [place]  [address]       Ph: [phone]       Fax: [phone]   RxID:   [id number] HYDROXYZINE HCL 25 MG/ML SOLN (HYDROXYZINE HCL) Take 1 tablet by mouth three times a day as needed hives  #30 x 1   Entered and Authorized by:   Roderick Pee MD   Signed by:   Roderick Pee MD on 04/11/2010   Method used:   Electronically to        [facility] [phone]* (retail)  [address]       [place], [place]  [address]       Ph: [phone]       Fax: [phone]   RxID:   [id number] ATARAX as needed  #30 x 3   Entered and Authorized by:   Roderick Pee MD   Signed by:   Roderick Pee MD on 04/11/2010   Method used:   Print then Give to Patient   RxID:   [id number] SEPTRA DS 800-160 MG TABS (SULFAMETHOXAZOLE-TRIMETHOPRIM) Take 1 tablet by mouth two times a day  #20 x 1   Entered and Authorized by:   Roderick Pee MD   Signed by:   Roderick Pee MD on 04/11/2010   Method used:   Print then Give to Patient   RxID:   [id number]    Medication Administration  Injection # 1:    Medication: Vit B12 1000 mcg    Diagnosis: ANEMIA, B12 DEFICIENCY (ICD-281.1)    Route: IM    Site: RUOQ gluteus    Exp Date: 12/24/2011    Lot #: [id number]    Mfr: American  Regent    Patient tolerated injection without complications    Given by: Kern Reap CMA Duncan Dull) (April 11, 2010 12:27 PM)  Orders Added: 1)  Vit B12 1000 mcg [J3420] 2)  Admin of Therapeutic Inj  intramuscular or subcutaneous [96[address]] 3)  Urinalysis-dipstick only (Medicare patient) [81003QW] 4)  Est. Patient Level III [[address]]     Laboratory Results   Urine Tests  Date/Time Received: April 11, 2010   Routine Urinalysis   Color: yellow Appearance: Cloudy Glucose: negative   (Normal Range: Negative) Bilirubin: negative   (Normal Range: Negative) Ketone: negative   (Normal Range: Negative) Spec. Gravity: 1.020   (Normal Range: 1.003-1.035) Blood: large   (Normal Range: Negative) pH: 6.0   (Normal Range: 5.0-8.0) Protein: 30   (Normal Range: Negative) Urobilinogen: 0.2   (Normal Range: 0-1) Nitrite: positive   (Normal Range: Negative) Leukocyte Esterace: large   (Normal Range: Negative)    Comments: Kern Reap CMA Duncan Dull)  April 11, 2010 12:28 PM

## 2010-07-27 NOTE — Miscellaneous (Signed)
Summary: mammogram update  Clinical Lists Changes  Observations: Added new observation of MAMMO DUE: 11/2010 (11/16/2009 9:37) Added new observation of MAMMOGRAM: normal (11/15/2009 9:37)      Preventive Care Screening  Mammogram:    Date:  11/15/2009    Next Due:  11/2010    Results:  normal

## 2010-07-27 NOTE — Assessment & Plan Note (Signed)
Summary: b12 inj//ccm/RESCH/RCD  Nurse Visit   Allergies: No Known Drug Allergies  Medication Administration  Injection # 1:    Medication: Vit B12 1000 mcg    Diagnosis: ANEMIA, B12 DEFICIENCY (ICD-281.1)    Route: SQ    Site: LUOQ gluteus    Exp Date: 02/24/2011    Lot #: [id number]    Mfr: American Regent    Patient tolerated injection without complications    Given by: Kern Reap CMA Duncan Dull) (October 18, 2009 2:36 PM)  Orders Added: 1)  Vit B12 1000 mcg [J3420] 2)  Admin of Therapeutic Inj  intramuscular or subcutaneous [[address]]

## 2010-08-02 NOTE — Procedures (Signed)
Summary: Upper Endoscopy w/DIL  Patient: [name] Note: All result statuses are Final unless otherwise noted.  Tests: (1) Upper Endoscopy w/DIL (UED)  UED Upper Endoscopy w/DIL                             DONE (C)     [facility]     [address]     [place], [place]  [address]           ENDOSCOPY PROCEDURE REPORT     PATIENT:  Veronica Phillips, Veronica Phillips  MR#:  [id number]     BIRTHDATE:  01-12-1923, 87 yrs. old  GENDER:  female     ENDOSCOPIST:  Judie Petit T. Russella Dar, MD, [facility]           Referred by:  Eugenio Hoes Tawanna Cooler, M.D.     PROCEDURE DATE:  07/26/2010     PROCEDURE:  EGD with dilatation over guidewire     ASA CLASS:  Class II     INDICATIONS:  1) GERD  2) dysphagia     MEDICATIONS:  Fentanyl 50 mcg IV, Versed 5 mg IV     TOPICAL ANESTHETIC:  Exactacain Spray     DESCRIPTION OF PROCEDURE:   After the risks benefits and     alternatives of the procedure were thoroughly explained, informed     consent was obtained.  The [id number] endoscope was     introduced through the mouth and advanced to the second portion of     the duodenum, without limitations.  The instrument was slowly     withdrawn as the mucosa was carefully examined.     <<PROCEDUREIMAGES>>     Mild, diffuse atrophic gastritis was found. The esophagus and     gastroesophageal junction were completely normal in appearance.     Tortuous and spastic esophagus noted. The duodenal bulb was normal     in appearance, as was the postbulbar duodenum. Dilation was then     performed at the total esophagus for dysphagia without a     stricture:           1) Dilator:  Savary over guidewire  Size:  17 mm     Resistance:  none  Heme:  none           COMPLICATIONS:  None           ENDOSCOPIC IMPRESSION:     1) Atrophic gastritis           RECOMMENDATIONS:     1) post dilation instructions     2) suspected motility disorder and possible underlying GERD,     assess response to dilation     3) continue antireflux  measures and a daily acid suppressant           Shafer Swamy T. Russella Dar, MD, [facility]           n.     REVISED:  07/27/2010 09:26 AM     eSIGNED:   Venita Lick. Miesha Bachmann at 07/27/2010 09:26 AM           Tiburcio Bash, [id number]  Note: An exclamation mark (!) indicates a result that was not dispersed into the flowsheet. Document Creation Date: 07/27/2010 9:26 AM _______________________________________________________________________  (1) Order result status: Final Collection or observation date-time: 07/26/2010 14:56 Requested date-time:  Receipt date-time:  Reported date-time:  Referring Physician:   Ordering Physician: Claudette Head [phone]) Specimen Source:  Source:  Launa Grill Order Number: [id number] Lab site:

## 2010-08-11 ENCOUNTER — Ambulatory Visit (INDEPENDENT_AMBULATORY_CARE_PROVIDER_SITE_OTHER): Payer: Medicare Other | Admitting: Family Medicine

## 2010-08-11 DIAGNOSIS — E538 Deficiency of other specified B group vitamins: Secondary | ICD-10-CM

## 2010-08-11 DIAGNOSIS — H6120 Impacted cerumen, unspecified ear: Secondary | ICD-10-CM

## 2010-08-11 MED ORDER — CYANOCOBALAMIN 1000 MCG/ML IJ SOLN
1000.0000 ug | Freq: Once | INTRAMUSCULAR | Status: AC
  Administered 2010-08-11: 1000 ug via INTRAMUSCULAR

## 2010-09-06 ENCOUNTER — Encounter: Payer: Self-pay | Admitting: Family Medicine

## 2010-09-07 ENCOUNTER — Ambulatory Visit (INDEPENDENT_AMBULATORY_CARE_PROVIDER_SITE_OTHER): Payer: Medicare Other | Admitting: Family Medicine

## 2010-09-07 DIAGNOSIS — E538 Deficiency of other specified B group vitamins: Secondary | ICD-10-CM

## 2010-09-07 MED ORDER — CYANOCOBALAMIN 1000 MCG/ML IJ SOLN
1000.0000 ug | Freq: Once | INTRAMUSCULAR | Status: AC
  Administered 2010-09-07: 1000 ug via INTRAMUSCULAR

## 2010-10-05 ENCOUNTER — Ambulatory Visit (INDEPENDENT_AMBULATORY_CARE_PROVIDER_SITE_OTHER): Payer: Medicare Other | Admitting: Family Medicine

## 2010-10-05 DIAGNOSIS — D518 Other vitamin B12 deficiency anemias: Secondary | ICD-10-CM

## 2010-10-05 DIAGNOSIS — D519 Vitamin B12 deficiency anemia, unspecified: Secondary | ICD-10-CM

## 2010-10-05 MED ORDER — CYANOCOBALAMIN 1000 MCG/ML IJ SOLN
1000.0000 ug | Freq: Once | INTRAMUSCULAR | Status: AC
  Administered 2010-10-05: 1000 ug via INTRAMUSCULAR

## 2010-11-01 ENCOUNTER — Encounter: Payer: Self-pay | Admitting: Family Medicine

## 2010-11-02 ENCOUNTER — Ambulatory Visit (INDEPENDENT_AMBULATORY_CARE_PROVIDER_SITE_OTHER): Payer: Medicare Other | Admitting: Family Medicine

## 2010-11-02 DIAGNOSIS — E538 Deficiency of other specified B group vitamins: Secondary | ICD-10-CM

## 2010-11-02 MED ORDER — CYANOCOBALAMIN 1000 MCG/ML IJ SOLN
1000.0000 ug | Freq: Once | INTRAMUSCULAR | Status: AC
  Administered 2010-11-02: 1000 ug via INTRAMUSCULAR

## 2010-11-10 NOTE — H&P (Signed)
Veronica Phillips, Veronica Phillips                ACCOUNT NO.:  [id number]   MEDICAL RECORD NO.:  [id number]          PATIENT TYPE:  EMS   LOCATION:  MAJO                         FACILITY:  [facility]   PHYSICIAN:  Valetta Mole. Swords, M.D. [facility] OF BIRTH:  December 24, 1922   DATE OF ADMISSION:  07/02/2005  DATE OF DISCHARGE:                                HISTORY & PHYSICAL   CHIEF COMPLAINT:  Fast heart beat.   HISTORY OF PRESENT ILLNESS:  [name] is an 77 year old female who was in  her usual state of good health until approximately 10 p.m. yesterday  evening.  At that time she developed a fast heart rate associated with vague  chest tightness.  She describes the heart rate as a pounding in her chest  which was very fast.  It lasted approximately 45 minutes and resolved  spontaneously.  The chest tightness also lasted approximately 45 minutes.  She has had no other associated symptoms.  She knows of no exacerbating or  alleviating factors.  She denies any new medications.  There has been no  associated nausea, vomiting, diaphoresis.   PAST MEDICAL HISTORY:  1.  Breast cancer in 1989, status post chemotherapy.  2.  She recently was diagnosed with hypertension and placed on a blood      pressure medicine.   She takes over-the-counter vitamins.  She takes a blood pressure medicine,  does not know the name.  In the ER history, apparently she started  hydrochlorothiazide 25 mg p.o. daily.   ALLERGIES:  None.   SOCIAL HISTORY:  She lives with her husband.  She is a nonsmoker, rare  alcohol use.   FAMILY HISTORY:  Brother deceased with prostate cancer.  She has two sisters  with breast disease.  There is no early heart disease in her family.   REVIEW OF SYSTEMS:  She denies any rashes, abdominal complaints, fever,  chills, change of bowel movements or urinary habits or any other complaints  on a full review of systems.   PHYSICAL EXAMINATION:  VITAL SIGNS:  Temperature 97, heart rate 76, blood  pressure 167/85, respirations 20.  GENERAL:  She appears as a well-developed, well-nourished albino female in  no acute distress.  HEENT:  Atraumatic, normocephalic.  Extraocular muscles are intact.  NECK:  Supple without lymphadenopathy, thyromegaly, jugular venous  distention or carotid bruits.  CHEST:  Clear to auscultation without increased work of breathing.  CARDIAC:  S1 and S2 normal.  There is no gallop.  She has a 2/6 systolic  ejection murmur.  ABDOMEN:  Active bowel sounds, soft, nontender.  There is no  hepatosplenomegaly, no masses are palpated.  EXTREMITIES:  There is no clubbing, cyanosis, or edema.  NEUROLOGIC:  She is alert and oriented without any motor or sensory  deficits.  VASCULAR:  Peripheral pulses are intact and normal.   EKG demonstrates normal sinus rhythm and is a normal EKG.   LABORATORY DATA:  Sodium 138, potassium 4.2, chloride 104, BUN 18, glucose  105.  Hemoglobin 13.3.  Troponin I 0.09, myoglobin 97.3, CK-MB 1.1.   ASSESSMENT AND PLAN:  1.  Tachyarrhythmia.  2.  Chest discomfort.   I suspect she has an underlying supraventricular tachycardia.  She will be  hospitalized and monitored.  Given her chest tightness, she needs to rule  out for an MI.  Note her first troponin is elevated.  If her troponin  increases, I think she will need a cardiology consult.  If her troponin is  unchanged or decreases, I think she would be okay for an outpatient  Cardiolite.  Given her presumed supraventricular tachycardia, I think she  needs an echocardiogram, and we will check a TSH.      Bruce Rexene Edison Swords, M.D. [facility]  Electronically Signed     BHS/MEDQ  D:  07/03/2005  T:  07/03/2005  Job:  [id number]   cc:   Tinnie Gens A. Tawanna Cooler, M.D. [facility]  [address] [place]  [place] [address]

## 2010-11-10 NOTE — Discharge Summary (Signed)
Veronica Phillips, Veronica Phillips                ACCOUNT NO.:  [id number]   MEDICAL RECORD NO.:  [id number]          PATIENT TYPE:  INP   LOCATION:  4705                         FACILITY:  [facility]   PHYSICIAN:  Rene Paci, M.D. [facility]ATE OF BIRTH:  Feb 14, 1923   DATE OF ADMISSION:  07/02/2005  DATE OF DISCHARGE:  07/05/2005                                 DISCHARGE SUMMARY   DISCHARGE DIAGNOSIS:  1.  Tachycardia, questionable paroxysmal supraventricular tachycardia.  2.  SICU thyroid.  3.  Mild hypotension.  4.  Grade 1 diastolic dysfunction noted on echo, July 04, 2005.   HISTORY OF PRESENT ILLNESS:  The patient is an 77 year old African American  female who was admitted after developing a rapid heart rate associated with  a chest tightness which vague chest tightness which lasted approximately 45  minutes and resolved spontaneously.  The patient was admitted for further  evaluation.   PAST MEDICAL HISTORY:  1.  Breast cancer in 1989 status post chemotherapy.  2.  Recently diagnosed hypertension placed on blood pressure medications.  3.  Vitiligo.   HOSPITAL COURSE:  Problem 1:  Tachy arrhythmia, questionable paroxysmal supraventricular  tachycardia.  The patient was maintained on telemetry during the  hospitalization and was maintained in normal sinus rhythm with no further  symptoms.  Cardiac enzymes were performed which were negative x 3.  Chest x-  ray showed mild pulmonary vasculature and congestion.  A 2D echo was  performed which showed a normal left ventricular ejection fraction of 55-  65%, however, did note a grade 1 diastolic dysfunction as well as some  aneurysmal motion of the intra-atrial septum.  Should the patient have  recurrence of symptoms, consider outpatient Holter monitor.   Problem 2:  Hypotension.  The patient was maintained on her home dosing of  hydrochlorothiazide, beta blocker, and Metoprolol 50 mg p.o. b.i.d. was  initiated.  The patient was noted to have  asymptomatic hypotension with  systolic pressure in the 80s on July 04, 2005.  As a result,  hydrochlorothiazide was discontinued and the Metoprolol was decreased to 25  mg p.o. b.i.d.  The patient's blood pressure on this dose today is 128/70.   Problem 3:  SICU therapy.  The patient was noted to have a mildly elevated  TSH with a value of 6.154.  T4 and T3 were checked which were both within  normal limits.  She will need outpatient follow up.   DISCHARGE MEDICATIONS:  Metoprolol 25 mg p.o. b.i.d., the patient is  instructed to hold hydrochlorothiazide.   DISCHARGE LABORATORY DATA:  Hemoglobin 11.4, hematocrit 33.3, TSH 6.154.   FOLLOW UP:  The patient is instructed to follow up with Dr. Tawanna Cooler in 1-2  weeks and call for an appointment.      Melissa S. Peggyann Juba, NP      Rene Paci, M.D. [facility]  Electronically Signed    MSO/MEDQ  D:  07/05/2005  T:  07/05/2005  Job:  [id number]   cc:   Tinnie Gens A. Tawanna Cooler, M.D. [facility]  [address] [place]  [place] [address]

## 2010-12-11 ENCOUNTER — Ambulatory Visit (INDEPENDENT_AMBULATORY_CARE_PROVIDER_SITE_OTHER): Payer: Medicare Other | Admitting: Family Medicine

## 2010-12-11 DIAGNOSIS — D518 Other vitamin B12 deficiency anemias: Secondary | ICD-10-CM

## 2010-12-11 MED ORDER — CYANOCOBALAMIN 1000 MCG/ML IJ SOLN
1000.0000 ug | Freq: Once | INTRAMUSCULAR | Status: AC
  Administered 2010-12-11: 1000 ug via INTRAMUSCULAR

## 2011-01-03 ENCOUNTER — Encounter: Payer: Self-pay | Admitting: Family Medicine

## 2011-01-11 ENCOUNTER — Ambulatory Visit (INDEPENDENT_AMBULATORY_CARE_PROVIDER_SITE_OTHER): Payer: Medicare Other | Admitting: Family Medicine

## 2011-01-11 DIAGNOSIS — D518 Other vitamin B12 deficiency anemias: Secondary | ICD-10-CM

## 2011-01-11 MED ORDER — CYANOCOBALAMIN 1000 MCG/ML IJ SOLN
1000.0000 ug | Freq: Once | INTRAMUSCULAR | Status: AC
  Administered 2011-01-11: 1000 ug via INTRAMUSCULAR

## 2011-02-08 ENCOUNTER — Encounter: Payer: Self-pay | Admitting: Family Medicine

## 2011-02-08 ENCOUNTER — Ambulatory Visit (INDEPENDENT_AMBULATORY_CARE_PROVIDER_SITE_OTHER): Payer: Medicare Other | Admitting: Family Medicine

## 2011-02-08 DIAGNOSIS — R300 Dysuria: Secondary | ICD-10-CM

## 2011-02-08 DIAGNOSIS — D518 Other vitamin B12 deficiency anemias: Secondary | ICD-10-CM

## 2011-02-08 DIAGNOSIS — N3000 Acute cystitis without hematuria: Secondary | ICD-10-CM

## 2011-02-08 LAB — POCT URINALYSIS DIPSTICK
Ketones, UA: NEGATIVE
Protein, UA: 100
Spec Grav, UA: 1.03

## 2011-02-08 MED ORDER — SULFAMETHOXAZOLE-TRIMETHOPRIM 800-160 MG PO TABS: 1.0000 | ORAL_TABLET | Freq: Two times a day (BID) | ORAL | Status: AC

## 2011-02-08 MED ORDER — CYANOCOBALAMIN 1000 MCG/ML IJ SOLN
1000.0000 ug | Freq: Once | INTRAMUSCULAR | Status: AC
  Administered 2011-08-30: 1000 ug via INTRAMUSCULAR

## 2011-02-08 NOTE — Patient Instructions (Signed)
Take the Septra , one twice daily for 10 days.  Consider taking a cranberry tablet daily to prevent urinary tract infections.  Remember to drink 24 ounces of water daily

## 2011-02-08 NOTE — Progress Notes (Signed)
  Subjective:    Patient ID: Veronica Phillips, female    DOB: Oct 22, 1922, 77 y.o.   MRN: [id number]  HPI Veronica Phillips is a 77 year old, married female, nonsmoker, who comes in with a 4-day history of cystitis.  She started have frequency, burning, and blood in the urine 4 days ago.  No fever no chills.  Last urine tract infection was a year ago, which was treated with Septra, that it worked.  She had no side effects from medication   Review of Systems General and neurologic review of systems otherwise negative    Objective:   Physical Exam  Well-developed well-nourished, female, in no acute distress.  Abdominal exam negative.  Urinalysis shows large amount of blood, moderate white cells, positive nitrate      Assessment & Plan:  Acute cystitis, Septra, x 10 days.  Return p.r.n.

## 2011-02-28 ENCOUNTER — Ambulatory Visit (INDEPENDENT_AMBULATORY_CARE_PROVIDER_SITE_OTHER): Payer: Medicare Other | Admitting: Family Medicine

## 2011-02-28 ENCOUNTER — Encounter: Payer: Self-pay | Admitting: Family Medicine

## 2011-02-28 VITALS — BP 112/70 | Temp 98.3°F | Wt 119.0 lb

## 2011-02-28 DIAGNOSIS — Z888 Allergy status to other drugs, medicaments and biological substances status: Secondary | ICD-10-CM

## 2011-02-28 DIAGNOSIS — T7840XA Allergy, unspecified, initial encounter: Secondary | ICD-10-CM

## 2011-02-28 MED ORDER — METHYLPREDNISOLONE ACETATE 80 MG/ML IJ SUSP
80.0000 mg | Freq: Once | INTRAMUSCULAR | Status: AC
  Administered 2011-02-28: 80 mg via INTRAMUSCULAR

## 2011-02-28 NOTE — Progress Notes (Signed)
  Subjective:    Patient ID: Veronica Phillips, female    DOB: [birth year], 77 y.o.   MRN: [id number]  HPI Probable allergic reaction. Patient started a second course of Septra 2 days ago and yesterday evening noticed hives and some swelling of the upper and lower lids. No trouble breathing and no difficulty swallowing. She did not take any antihistamines. Has not taken any further doses of antibiotic today. No prior history of allergy to sulfa. Denies urinary symptoms at this time. No fever or chills.  Past Medical History  Diagnosis Date  . Hypertension   . Arthritis   . Cancer   . Diverticulosis of colon   . Glaucoma    Past Surgical History  Procedure Date  . Breast surgery 1989    mastectmy    reports that she has never smoked. She has never used smokeless tobacco. She reports that she does not drink alcohol or use illicit drugs. family history includes Heart disease in an unspecified family member; Hypertension in an unspecified family member; Lung cancer in an unspecified family member; and Stroke in an unspecified family member. Allergies  Allergen Reactions  . Septra (Bactrim) Swelling      Review of Systems  Constitutional: Negative for fever, chills and fatigue.  HENT: Negative for sore throat and trouble swallowing.   Respiratory: Negative for cough, choking, chest tightness, shortness of breath, wheezing and stridor.   Skin: Positive for rash.       Objective:   Physical Exam  Constitutional: She appears well-developed and well-nourished.  HENT:  Right Ear: External ear normal.  Left Ear: External ear normal.  Mouth/Throat: Oropharynx is clear and moist.       Patient has some obvious angioedema of the upper and lower lip. Oropharynx is clear. No evidence for any tongue edema  Neck: Neck supple.  Cardiovascular: Normal rate and regular rhythm.   Pulmonary/Chest: Effort normal and breath sounds normal. No respiratory distress. She has no wheezes. She has no  rales.  Musculoskeletal: She exhibits no edema.  Lymphadenopathy:    She has no cervical adenopathy.  Skin:       Patient has some scattered hives on her trunk and extremities          Assessment & Plan:  Probable allergic reaction to Septra. Discontinue Septra. Start Atarax and patient already has prescription. Depo-Medrol 80 mg IM. Consider over-the-counter Zantac or Pepcid and followup if not resolving over the next few days

## 2011-02-28 NOTE — Patient Instructions (Signed)
STOP septra and no more sulfa drugs in future. Start Atarax one every 8 hours for the next few days. Consider Zantac or Pepcid twice daily to help counter allergic reaction. Follow up immediately for any tongue swelling or any trouble breathing.

## 2011-03-12 ENCOUNTER — Ambulatory Visit (INDEPENDENT_AMBULATORY_CARE_PROVIDER_SITE_OTHER): Payer: Medicare Other | Admitting: Family Medicine

## 2011-03-12 DIAGNOSIS — Z23 Encounter for immunization: Secondary | ICD-10-CM

## 2011-03-12 DIAGNOSIS — D518 Other vitamin B12 deficiency anemias: Secondary | ICD-10-CM

## 2011-03-12 MED ORDER — CYANOCOBALAMIN 1000 MCG/ML IJ SOLN
1000.0000 ug | Freq: Once | INTRAMUSCULAR | Status: AC
  Administered 2011-03-12: 1000 ug via INTRAMUSCULAR

## 2011-04-11 ENCOUNTER — Ambulatory Visit (INDEPENDENT_AMBULATORY_CARE_PROVIDER_SITE_OTHER): Payer: Medicare Other | Admitting: Family Medicine

## 2011-04-11 DIAGNOSIS — D518 Other vitamin B12 deficiency anemias: Secondary | ICD-10-CM

## 2011-04-11 MED ORDER — CYANOCOBALAMIN 1000 MCG/ML IJ SOLN
1000.0000 ug | Freq: Once | INTRAMUSCULAR | Status: AC
  Administered 2011-04-11: 1000 ug via INTRAMUSCULAR

## 2011-05-09 ENCOUNTER — Ambulatory Visit: Payer: Medicare Other | Admitting: Family Medicine

## 2011-06-08 ENCOUNTER — Ambulatory Visit: Payer: Medicare Other | Admitting: *Deleted

## 2011-06-08 DIAGNOSIS — E538 Deficiency of other specified B group vitamins: Secondary | ICD-10-CM

## 2011-06-08 MED ORDER — CYANOCOBALAMIN 1000 MCG/ML IJ SOLN
1000.0000 ug | Freq: Once | INTRAMUSCULAR | Status: AC
  Administered 2011-06-08: 1000 ug via INTRAMUSCULAR

## 2011-07-06 ENCOUNTER — Ambulatory Visit (INDEPENDENT_AMBULATORY_CARE_PROVIDER_SITE_OTHER): Payer: Medicare Other | Admitting: *Deleted

## 2011-07-06 DIAGNOSIS — D518 Other vitamin B12 deficiency anemias: Secondary | ICD-10-CM

## 2011-07-06 DIAGNOSIS — D519 Vitamin B12 deficiency anemia, unspecified: Secondary | ICD-10-CM

## 2011-07-06 MED ORDER — CYANOCOBALAMIN 1000 MCG/ML IJ SOLN
1000.0000 ug | Freq: Once | INTRAMUSCULAR | Status: AC
  Administered 2011-07-06: 1000 ug via INTRAMUSCULAR

## 2011-08-03 ENCOUNTER — Telehealth: Payer: Self-pay | Admitting: *Deleted

## 2011-08-03 ENCOUNTER — Ambulatory Visit (INDEPENDENT_AMBULATORY_CARE_PROVIDER_SITE_OTHER): Payer: Medicare Other | Admitting: *Deleted

## 2011-08-03 DIAGNOSIS — E538 Deficiency of other specified B group vitamins: Secondary | ICD-10-CM

## 2011-08-03 MED ORDER — CYANOCOBALAMIN 1000 MCG/ML IJ SOLN
1000.0000 ug | Freq: Once | INTRAMUSCULAR | Status: AC
  Administered 2011-08-03: 1000 ug via INTRAMUSCULAR

## 2011-08-03 NOTE — Telephone Encounter (Signed)
Mr Henney is concerned because her husband is being treated for seizures and they believe he had a stroke.  He will be discharged from the hospital soon and they are not sure if they should continue the Keppra?

## 2011-08-06 NOTE — Telephone Encounter (Signed)
Left message on machine for daughter 

## 2011-08-06 NOTE — Telephone Encounter (Signed)
Fleet Contras please call,,,,,,,,,,, tell them to follow the advice of the neurologist on his medication

## 2011-08-30 ENCOUNTER — Ambulatory Visit (INDEPENDENT_AMBULATORY_CARE_PROVIDER_SITE_OTHER): Payer: Medicare Other | Admitting: Family Medicine

## 2011-08-30 DIAGNOSIS — E538 Deficiency of other specified B group vitamins: Secondary | ICD-10-CM

## 2011-09-03 ENCOUNTER — Ambulatory Visit: Payer: Medicare Other | Admitting: Family Medicine

## 2011-09-27 ENCOUNTER — Ambulatory Visit (INDEPENDENT_AMBULATORY_CARE_PROVIDER_SITE_OTHER): Payer: Medicare Other | Admitting: Family Medicine

## 2011-09-27 DIAGNOSIS — E538 Deficiency of other specified B group vitamins: Secondary | ICD-10-CM

## 2011-09-27 DIAGNOSIS — D518 Other vitamin B12 deficiency anemias: Secondary | ICD-10-CM

## 2011-09-27 MED ORDER — CYANOCOBALAMIN 1000 MCG/ML IJ SOLN
1000.0000 ug | Freq: Once | INTRAMUSCULAR | Status: AC
  Administered 2011-09-27: 1000 ug via INTRAMUSCULAR

## 2011-10-30 ENCOUNTER — Ambulatory Visit (INDEPENDENT_AMBULATORY_CARE_PROVIDER_SITE_OTHER): Payer: Medicare Other | Admitting: Family Medicine

## 2011-10-30 DIAGNOSIS — D518 Other vitamin B12 deficiency anemias: Secondary | ICD-10-CM

## 2011-10-30 MED ORDER — CYANOCOBALAMIN 1000 MCG/ML IJ SOLN
1000.0000 ug | Freq: Once | INTRAMUSCULAR | Status: AC
  Administered 2011-10-30: 1000 ug via INTRAMUSCULAR

## 2011-11-27 ENCOUNTER — Ambulatory Visit (INDEPENDENT_AMBULATORY_CARE_PROVIDER_SITE_OTHER): Payer: Medicare Other | Admitting: Family Medicine

## 2011-11-27 DIAGNOSIS — E538 Deficiency of other specified B group vitamins: Secondary | ICD-10-CM

## 2011-11-27 DIAGNOSIS — D518 Other vitamin B12 deficiency anemias: Secondary | ICD-10-CM

## 2011-11-27 MED ORDER — CYANOCOBALAMIN 1000 MCG/ML IJ SOLN
1000.0000 ug | Freq: Once | INTRAMUSCULAR | Status: AC
  Administered 2011-11-27: 1000 ug via INTRAMUSCULAR

## 2011-12-28 ENCOUNTER — Ambulatory Visit (INDEPENDENT_AMBULATORY_CARE_PROVIDER_SITE_OTHER): Payer: Medicare Other | Admitting: *Deleted

## 2011-12-28 DIAGNOSIS — E538 Deficiency of other specified B group vitamins: Secondary | ICD-10-CM

## 2011-12-28 DIAGNOSIS — D518 Other vitamin B12 deficiency anemias: Secondary | ICD-10-CM

## 2011-12-28 MED ORDER — CYANOCOBALAMIN 1000 MCG/ML IJ SOLN
1000.0000 ug | Freq: Once | INTRAMUSCULAR | Status: AC
  Administered 2011-12-28: 1000 ug via INTRAMUSCULAR

## 2012-01-25 ENCOUNTER — Ambulatory Visit: Payer: Medicare Other | Admitting: *Deleted

## 2012-01-25 DIAGNOSIS — D518 Other vitamin B12 deficiency anemias: Secondary | ICD-10-CM

## 2012-01-25 MED ORDER — CYANOCOBALAMIN 1000 MCG/ML IJ SOLN
1000.0000 ug | Freq: Once | INTRAMUSCULAR | Status: AC
  Administered 2012-01-25: 1000 ug via INTRAMUSCULAR

## 2012-02-26 ENCOUNTER — Ambulatory Visit: Payer: Medicare Other | Admitting: *Deleted

## 2012-02-26 ENCOUNTER — Ambulatory Visit: Payer: Medicare Other | Admitting: Family Medicine

## 2012-03-18 ENCOUNTER — Ambulatory Visit (INDEPENDENT_AMBULATORY_CARE_PROVIDER_SITE_OTHER): Payer: Medicare Other | Admitting: Family Medicine

## 2012-03-18 ENCOUNTER — Ambulatory Visit (INDEPENDENT_AMBULATORY_CARE_PROVIDER_SITE_OTHER): Payer: Medicare Other

## 2012-03-18 DIAGNOSIS — Z23 Encounter for immunization: Secondary | ICD-10-CM

## 2012-03-18 DIAGNOSIS — E538 Deficiency of other specified B group vitamins: Secondary | ICD-10-CM

## 2012-03-18 MED ORDER — CYANOCOBALAMIN 1000 MCG/ML IJ SOLN
1000.0000 ug | Freq: Once | INTRAMUSCULAR | Status: AC
  Administered 2012-03-18: 1000 ug via INTRAMUSCULAR

## 2012-03-27 ENCOUNTER — Ambulatory Visit: Payer: Medicare Other | Admitting: Family Medicine

## 2012-04-18 ENCOUNTER — Ambulatory Visit (INDEPENDENT_AMBULATORY_CARE_PROVIDER_SITE_OTHER): Payer: Medicare Other | Admitting: *Deleted

## 2012-04-18 DIAGNOSIS — E538 Deficiency of other specified B group vitamins: Secondary | ICD-10-CM

## 2012-04-18 MED ORDER — CYANOCOBALAMIN 1000 MCG/ML IJ SOLN: 1000.0000 ug | Freq: Once | INTRAMUSCULAR | Status: DC

## 2012-05-19 ENCOUNTER — Ambulatory Visit: Payer: Medicare Other | Admitting: Family Medicine

## 2012-05-19 ENCOUNTER — Encounter: Payer: Self-pay | Admitting: Family Medicine

## 2012-05-19 ENCOUNTER — Ambulatory Visit: Payer: Medicare Other | Admitting: *Deleted

## 2012-05-19 ENCOUNTER — Ambulatory Visit (INDEPENDENT_AMBULATORY_CARE_PROVIDER_SITE_OTHER): Payer: Medicare Other | Admitting: Family Medicine

## 2012-05-19 DIAGNOSIS — R609 Edema, unspecified: Secondary | ICD-10-CM | POA: Insufficient documentation

## 2012-05-19 DIAGNOSIS — R600 Localized edema: Secondary | ICD-10-CM | POA: Insufficient documentation

## 2012-05-19 DIAGNOSIS — E538 Deficiency of other specified B group vitamins: Secondary | ICD-10-CM

## 2012-05-19 MED ORDER — CYANOCOBALAMIN 1000 MCG/ML IJ SOLN
1000.0000 ug | Freq: Once | INTRAMUSCULAR | Status: AC
  Administered 2012-05-19: 1000 ug via INTRAMUSCULAR

## 2012-05-19 MED ORDER — HYDROCHLOROTHIAZIDE 12.5 MG PO TABS: 12.5000 mg | ORAL_TABLET | Freq: Every day | ORAL | Status: DC

## 2012-05-19 NOTE — Progress Notes (Signed)
  Subjective:    Patient ID: Veronica Phillips, female    DOB: 05/24/1923, 78 y.o.   MRN: [id number]  HPI Veronica Phillips is a 78 year old recently widowed female her husband Dayton Scrape died the 23rd who comes in today for evaluation of bilateral lower extremity edema  She's been eating a lot of food since her husband died which is very softly and is noticed the past week both legs have been a little puffy.  She also had some urinary tract symptoms with dysuria however she drink lots of cranberry juice and that went away. Well-developed   Review of Systems    general and vascular review of systems otherwise negative Objective:   Physical Exam  Well-developed well-nourished female in no acute distress peripheral pulses are normal chest one plus edema bilaterally no calf tenderness      Assessment & Plan:

## 2012-05-19 NOTE — Patient Instructions (Signed)
Take the diuretic one tablet daily in the morning  Avoid sodium  Return when necessary

## 2012-06-20 ENCOUNTER — Ambulatory Visit: Payer: Medicare Other | Admitting: *Deleted

## 2012-07-21 ENCOUNTER — Ambulatory Visit (INDEPENDENT_AMBULATORY_CARE_PROVIDER_SITE_OTHER): Payer: Medicare Other | Admitting: *Deleted

## 2012-07-21 DIAGNOSIS — E538 Deficiency of other specified B group vitamins: Secondary | ICD-10-CM

## 2012-07-21 MED ORDER — CYANOCOBALAMIN 1000 MCG/ML IJ SOLN
1000.0000 ug | Freq: Once | INTRAMUSCULAR | Status: AC
  Administered 2012-07-21: 1000 ug via INTRAMUSCULAR

## 2012-08-18 ENCOUNTER — Ambulatory Visit: Payer: Medicare Other | Admitting: Family Medicine

## 2012-08-18 ENCOUNTER — Ambulatory Visit (INDEPENDENT_AMBULATORY_CARE_PROVIDER_SITE_OTHER): Payer: Medicare Other | Admitting: *Deleted

## 2012-08-18 DIAGNOSIS — E538 Deficiency of other specified B group vitamins: Secondary | ICD-10-CM

## 2012-08-18 MED ORDER — CYANOCOBALAMIN 1000 MCG/ML IJ SOLN
1000.0000 ug | Freq: Once | INTRAMUSCULAR | Status: AC
  Administered 2012-08-18: 1000 ug via INTRAMUSCULAR

## 2012-09-15 ENCOUNTER — Ambulatory Visit (INDEPENDENT_AMBULATORY_CARE_PROVIDER_SITE_OTHER): Payer: Medicare Other | Admitting: *Deleted

## 2012-09-15 DIAGNOSIS — E538 Deficiency of other specified B group vitamins: Secondary | ICD-10-CM

## 2012-09-15 MED ORDER — CYANOCOBALAMIN 1000 MCG/ML IJ SOLN
1000.0000 ug | Freq: Once | INTRAMUSCULAR | Status: AC
  Administered 2012-09-15: 1000 ug via INTRAMUSCULAR

## 2012-09-26 ENCOUNTER — Other Ambulatory Visit: Payer: Self-pay | Admitting: *Deleted

## 2012-09-28 ENCOUNTER — Emergency Department (HOSPITAL_COMMUNITY): Payer: Medicare Other

## 2012-09-28 ENCOUNTER — Encounter (HOSPITAL_COMMUNITY): Payer: Self-pay | Admitting: Emergency Medicine

## 2012-09-28 ENCOUNTER — Observation Stay (HOSPITAL_COMMUNITY)
Admission: EM | Admit: 2012-09-28 | Discharge: 2012-09-29 | Disposition: A | Discharge status: Home / Self Care | Payer: Medicare Other | Attending: Family Medicine | Admitting: Family Medicine

## 2012-09-28 DIAGNOSIS — I10 Essential (primary) hypertension: Principal | ICD-10-CM

## 2012-09-28 DIAGNOSIS — N3000 Acute cystitis without hematuria: Secondary | ICD-10-CM

## 2012-09-28 DIAGNOSIS — L501 Idiopathic urticaria: Secondary | ICD-10-CM

## 2012-09-28 DIAGNOSIS — R079 Chest pain, unspecified: Secondary | ICD-10-CM

## 2012-09-28 DIAGNOSIS — H409 Unspecified glaucoma: Secondary | ICD-10-CM

## 2012-09-28 DIAGNOSIS — R1319 Other dysphagia: Secondary | ICD-10-CM

## 2012-09-28 DIAGNOSIS — I160 Hypertensive urgency: Secondary | ICD-10-CM | POA: Diagnosis present

## 2012-09-28 DIAGNOSIS — R609 Edema, unspecified: Secondary | ICD-10-CM

## 2012-09-28 DIAGNOSIS — R1310 Dysphagia, unspecified: Secondary | ICD-10-CM

## 2012-09-28 DIAGNOSIS — M1990 Unspecified osteoarthritis, unspecified site: Secondary | ICD-10-CM

## 2012-09-28 DIAGNOSIS — K219 Gastro-esophageal reflux disease without esophagitis: Secondary | ICD-10-CM

## 2012-09-28 DIAGNOSIS — K5730 Diverticulosis of large intestine without perforation or abscess without bleeding: Secondary | ICD-10-CM

## 2012-09-28 DIAGNOSIS — D518 Other vitamin B12 deficiency anemias: Secondary | ICD-10-CM

## 2012-09-28 LAB — CBC WITH DIFFERENTIAL/PLATELET
Basophils Relative: 0 % (ref 0–1)
Eosinophils Absolute: 0 10*3/uL (ref 0.0–0.7)
HCT: 33.8 % — ABNORMAL LOW (ref 36.0–46.0)
Hemoglobin: 11.4 g/dL — ABNORMAL LOW (ref 12.0–15.0)
Lymphs Abs: 1.8 10*3/uL (ref 0.7–4.0)
MCH: 29.9 pg (ref 26.0–34.0)
MCHC: 33.7 g/dL (ref 30.0–36.0)
MCV: 88.7 fL (ref 78.0–100.0)
Monocytes Absolute: 0.7 10*3/uL (ref 0.1–1.0)
Monocytes Relative: 14 % — ABNORMAL HIGH (ref 3–12)
RBC: 3.81 MIL/uL — ABNORMAL LOW (ref 3.87–5.11)

## 2012-09-28 LAB — PHOSPHORUS: Phosphorus: 3.4 mg/dL (ref 2.3–4.6)

## 2012-09-28 LAB — POCT I-STAT, CHEM 8
BUN: 17 mg/dL (ref 6–23)
Chloride: 106 mEq/L (ref 96–112)
Potassium: 4.1 mEq/L (ref 3.5–5.1)
Sodium: 145 mEq/L (ref 135–145)

## 2012-09-28 LAB — URINALYSIS, ROUTINE W REFLEX MICROSCOPIC
Glucose, UA: NEGATIVE mg/dL
Ketones, ur: NEGATIVE mg/dL
Specific Gravity, Urine: 1.008 (ref 1.005–1.030)
pH: 7.5 (ref 5.0–8.0)

## 2012-09-28 LAB — URINE MICROSCOPIC-ADD ON

## 2012-09-28 LAB — TROPONIN I: Troponin I: <0.3 ng/mL (ref <0.30)

## 2012-09-28 MED ORDER — PANTOPRAZOLE SODIUM 40 MG PO TBEC
40.0000 mg | DELAYED_RELEASE_TABLET | Freq: Every day | ORAL | Status: DC
  Administered 2012-09-28 – 2012-09-29 (×2): 40 mg via ORAL
  Filled 2012-09-28 (×2): qty 1

## 2012-09-28 MED ORDER — ACETAMINOPHEN 650 MG RE SUPP: 650.0000 mg | Freq: Four times a day (QID) | RECTAL | Status: DC | PRN

## 2012-09-28 MED ORDER — HYDROCHLOROTHIAZIDE 25 MG PO TABS
12.5000 mg | ORAL_TABLET | Freq: Once | ORAL | Status: DC
  Filled 2012-09-28: qty 1

## 2012-09-28 MED ORDER — SODIUM CHLORIDE 0.9 % IJ SOLN: 3.0000 mL | INTRAMUSCULAR | Status: DC | PRN

## 2012-09-28 MED ORDER — ACETAMINOPHEN 325 MG PO TABS: 650.0000 mg | ORAL_TABLET | Freq: Four times a day (QID) | ORAL | Status: DC | PRN

## 2012-09-28 MED ORDER — FERROUS GLUCONATE 324 (38 FE) MG PO TABS
325.0000 mg | ORAL_TABLET | Freq: Every day | ORAL | Status: DC
  Administered 2012-09-29: 324 mg via ORAL
  Filled 2012-09-28 (×2): qty 1

## 2012-09-28 MED ORDER — ONDANSETRON HCL 4 MG PO TABS: 4.0000 mg | ORAL_TABLET | Freq: Four times a day (QID) | ORAL | Status: DC | PRN

## 2012-09-28 MED ORDER — IOHEXOL 300 MG/ML  SOLN
80.0000 mL | Freq: Once | INTRAMUSCULAR | Status: AC | PRN
  Administered 2012-09-28: 80 mL via INTRAVENOUS

## 2012-09-28 MED ORDER — AMLODIPINE BESYLATE 5 MG PO TABS
5.0000 mg | ORAL_TABLET | Freq: Once | ORAL | Status: DC
  Filled 2012-09-28: qty 1

## 2012-09-28 MED ORDER — SODIUM CHLORIDE 0.9 % IV SOLN: 250.0000 mL | INTRAVENOUS | Status: DC | PRN

## 2012-09-28 MED ORDER — SODIUM CHLORIDE 0.9 % IJ SOLN: 3.0000 mL | Freq: Two times a day (BID) | INTRAMUSCULAR | Status: DC

## 2012-09-28 MED ORDER — TRAVOPROST 0.004 % OP SOLN: 1.0000 [drp] | Freq: Every day | OPHTHALMIC | Status: DC

## 2012-09-28 MED ORDER — TRAVOPROST (BAK FREE) 0.004 % OP SOLN
1.0000 [drp] | Freq: Every day | OPHTHALMIC | Status: DC
  Administered 2012-09-28: 1 [drp] via OPHTHALMIC
  Filled 2012-09-28: qty 2.5

## 2012-09-28 MED ORDER — SODIUM CHLORIDE 0.9 % IJ SOLN
3.0000 mL | Freq: Two times a day (BID) | INTRAMUSCULAR | Status: DC
  Administered 2012-09-28 – 2012-09-29 (×3): 3 mL via INTRAVENOUS

## 2012-09-28 MED ORDER — ONDANSETRON HCL 4 MG/2ML IJ SOLN: 4.0000 mg | Freq: Four times a day (QID) | INTRAMUSCULAR | Status: DC | PRN

## 2012-09-28 MED ORDER — LABETALOL HCL 5 MG/ML IV SOLN
20.0000 mg | Freq: Once | INTRAVENOUS | Status: AC
  Administered 2012-09-28: 20 mg via INTRAVENOUS
  Filled 2012-09-28: qty 4

## 2012-09-28 NOTE — ED Notes (Signed)
Informed pt that a urine sample was needed. Pt states went to the bath room just a minute ago.

## 2012-09-28 NOTE — ED Provider Notes (Signed)
History     CSN: [id number]  Arrival date & time 09/28/12  0059   First MD Initiated Contact with Patient 09/28/12 0135      Chief Complaint  Patient presents with  . Hypertension    (Consider location/radiation/quality/duration/timing/severity/associated sxs/prior treatment) HPI Comments: Patient states, that tonight.  She had a slight headache, and noticed, her legs were swollen.  She checked her blood pressure, which she does intermittently and it was 235/110.  In the past.  She has taken hydrochlorothiazide for several weeks, with good resolution of the swelling, and decrease in her blood pressure.  She does have an appointment with her primary care physician on Monday.  She denies any weakness in extremities, visual changes, but does have a dull headache, and very mild pretibial edema for the past several, days that is  improved in the morning.  Denies any chest pain or shortness of breath, but does state that her GERD symptoms have been worse over the last week  Patient is a 79 y.o. female presenting with hypertension. The history is provided by the patient.  Hypertension This is a recurrent problem. The current episode started today. The problem occurs intermittently. Associated symptoms include headaches. Pertinent negatives include no chest pain, chills, coughing, fever, nausea, neck pain, numbness or weakness.    Past Medical History  Diagnosis Date  . Hypertension   . Arthritis   . Cancer   . Diverticulosis of colon   . Glaucoma(365)     Past Surgical History  Procedure Laterality Date  . Breast surgery  1989    mastectmy    Family History  Problem Relation Age of Onset  . Hypertension      family hx  . Lung cancer      family hx  . Stroke      family hx  . Heart disease      family hx    History  Substance Use Topics  . Smoking status: Never Smoker   . Smokeless tobacco: Never Used  . Alcohol Use: No    OB History   Grav Para Term Preterm Abortions  TAB SAB Ect Mult Living                  Review of Systems  Constitutional: Negative for fever and chills.  HENT: Negative for mouth sores and neck pain.   Eyes: Negative for pain and visual disturbance.  Respiratory: Negative for cough and shortness of breath.   Cardiovascular: Positive for leg swelling. Negative for chest pain and palpitations.  Gastrointestinal: Negative for nausea.  Neurological: Positive for headaches. Negative for dizziness, speech difficulty, weakness and numbness.  All other systems reviewed and are negative.    Allergies  Septra  Home Medications   Current Outpatient Rx  Name  Route  Sig  Dispense  Refill  . Calcium Carbonate (CALCARB 600) 1500 MG TABS   Oral   Take by mouth daily.           . cyanocobalamin (,VITAMIN B-12,) 1000 MCG/ML injection   Intramuscular   Inject 1,000 mcg into the muscle every 30 (thirty) days.           . ferrous gluconate (FERGON) 325 MG tablet   Oral   Take 325 mg by mouth daily with breakfast.           . Multiple Vitamin (MULTIVITAMIN) tablet   Oral   Take 1 tablet by mouth daily.           Marland Kitchen  ranitidine (ZANTAC) 150 MG capsule   Oral   Take 150 mg by mouth daily.          . travoprost, benzalkonium, (TRAVATAN) 0.004 % ophthalmic solution   Both Eyes   Place 1 drop into both eyes at bedtime.             BP 180/81  Pulse 81  Temp(Src) 98.2 F (36.8 C) (Oral)  Resp 20  SpO2 100%  Physical Exam  Constitutional: She is oriented to person, place, and time. She appears well-developed and well-nourished.  HENT:  Head: Normocephalic.  Eyes: Conjunctivae are normal. Pupils are equal, round, and reactive to light.  Neck: Normal range of motion.  Cardiovascular: Normal rate and regular rhythm.   Pulmonary/Chest: Effort normal and breath sounds normal. She has no rales. She exhibits no tenderness.  Abdominal: Soft. She exhibits no distension. There is no tenderness.  Musculoskeletal: She exhibits  no edema and no tenderness.  Neurological: She is alert and oriented to person, place, and time.  Skin: Skin is warm. No rash noted. No erythema.    ED Course  Procedures (including critical care time)  Labs Reviewed  CBC WITH DIFFERENTIAL - Abnormal; Notable for the following:    RBC 3.81 (*)    Hemoglobin 11.4 (*)    HCT 33.8 (*)    Monocytes Relative 14 (*)    All other components within normal limits  URINALYSIS, ROUTINE W REFLEX MICROSCOPIC - Abnormal; Notable for the following:    Color, Urine STRAW (*)    Hgb urine dipstick SMALL (*)    Leukocytes, UA LARGE (*)    All other components within normal limits  URINE MICROSCOPIC-ADD ON - Abnormal; Notable for the following:    Squamous Epithelial / LPF FEW (*)    Bacteria, UA FEW (*)    All other components within normal limits  POCT I-STAT, CHEM 8 - Abnormal; Notable for the following:    Creatinine, Ser 1.20 (*)    Hemoglobin 11.9 (*)    HCT 35.0 (*)    All other components within normal limits  URINE CULTURE  TROPONIN I   Dg Chest 2 View  09/28/2012  *RADIOLOGY REPORT*  Clinical Data: Hypertension and headache.  CHEST - 2 VIEW  Comparison: 07/03/2005  Findings: Postoperative change of the left mastectomy and surgical clips in the left axilla.  Since the previous study, there appears to be interval enlargement of the right hilum with volume loss and increased density in the right apex.  Suggest CT examination to exclude metastatic disease.  Mild cardiac enlargement with normal pulmonary vascularity.  No blunting of costophrenic angles.  No pneumothorax.  IMPRESSION: Increased prominence of right hilum with volume loss and increased density in the right apex.  CT recommended to exclude metastasis or obstructing lesion.   Original Report Authenticated By: Burman Nieves, M.D.    Ct Head Wo Contrast  09/28/2012  *RADIOLOGY REPORT*  Clinical Data: Headache and hypertension.  CT HEAD WITHOUT CONTRAST  Technique:  Contiguous axial  images were obtained from the base of the skull through the vertex without contrast.  Comparison: None.  Findings: Diffuse cerebral atrophy.  Mild ventricular dilatation consistent with central atrophy.  Patchy low attenuation changes in the deep white matter consistent with small vessel ischemia.  No mass effect or midline shift.  No abnormal extra-axial fluid collections.  Gray-white matter junctions are distinct.  Basal cisterns are not effaced.  No evidence of acute intracranial hemorrhage.  Vascular  calcifications.  Visualized paranasal sinuses and mastoid air cells are not opacified.  No depressed skull fractures.  IMPRESSION: No acute intracranial abnormalities.  Mild chronic-appearing atrophy and small vessel ischemic changes.   Original Report Authenticated By: Burman Nieves, M.D.    Ct Chest W Contrast  09/28/2012  *RADIOLOGY REPORT*  Clinical Data: Hypertension.  History of breast cancer with mastectomy.  Abnormal chest x-ray.  CT CHEST WITH CONTRAST  Technique:  Multidetector CT imaging of the chest was performed following the standard protocol during bolus administration of intravenous contrast.  Contrast: 80mL OMNIPAQUE IOHEXOL 300 MG/ML  SOLN  Comparison: Chest 09/28/2012  Findings: There is marked distension and dilatation of the esophagus which is filled with fluid, air, and ingested material. This is consistent with achalasia, dysmotility, or outlet obstruction.  The distal esophagus is narrowed and this could represent inflammatory or neoplastic stricture.  The dilated esophagus accounts for abnormalities demonstrated on chest x-ray.  Mild cardiac enlargement.  Normal caliber thoracic aorta with calcification.  Prominent central pulmonary vascularity suggest pulmonary arterial hypertension accounts for hilar changes on the chest radiograph.  No significant lymphadenopathy in the chest. Postoperative changes with left mastectomy and surgical clips in the left axilla.  Scattered fibrosis in the  lungs.  Dependent atelectasis in the bases.  No pleural effusion.  No pneumothorax. No significant airspace disease or consolidation.  Degenerative changes in the thoracic spine.  No vertebral compression deformities.  No destructive bone lesions.  Visualized portions of the upper abdominal organs demonstrate no gross abnormality.  IMPRESSION:  1.  Distended fluid and gas filled esophagus accounts for chest x- ray abnormality on the right.  Distal narrowing.  This could represent  achalasia or distal obstruction. 2.  Pulmonary arterial hypertension accounts for right hilar changes on chest x-ray. 3.  Scattered fibrosis and dependent atelectasis in the lungs.  No evidence of active pulmonary disease.   Original Report Authenticated By: Burman Nieves, M.D.      1. Hypertensive urgency   2. Chest pain     ED ECG REPORT   Date: 09/28/2012  EKG Time: 6:27 AM  Rate: 84  Rhythm: normal sinus rhythm,  ST depression in lateral leads  Axis: normal  Intervals:none  ST&T Change normal  Narrative Interpretation: changed             MDM   Patient's blood pressure decreased slightly with IV labetalol.  She was then switched to by mouth Norvasc and hydrochlorothiazide.  She has been admitted with hypertensive urgency.  EKG changes renal insufficiency        Arman Filter, NP 09/28/12 [phone]

## 2012-09-28 NOTE — ED Notes (Signed)
Pt reports varying BP's. Unsure of what her normal BP is at home, but states that "I normally run low." Pt denies HA at this time, but reports ongoing sinus pressure. Denies palpations, chest pain, weakness, dizziness, blurred vision, N/V.

## 2012-09-28 NOTE — ED Notes (Signed)
Admitting md at bedside, states to cancel bp meds that r ordered

## 2012-09-28 NOTE — H&P (Addendum)
[facility] History and Physical  BETTYE SITTON ZOX:[id number] DOB: [birth year] DOA: 09/28/2012  Referring physician: Dr. Dierdre Highman PCP: Evette Georges, MD  Specialists: none  Chief Complaint:   HPI: Veronica Phillips is a 79 y.o. female  With h/o HTN reportedly off of antihypertensive medication for 1 year, glaucoma, and arthritis. Presented to the ED after she found her blood pressure to be elevated at home per reports at 235/110.  She checked her blood pressure yesterday evening and given elevated BP she came to the ED for further evaluation.  The problem was persistent and nothing patient knew of made it better or worse. She states that she had some mild headache she describes as if it was sinus type discomfort.  She denies any blurred vision or chest discomfort. Otherwise had no other complaints and denied any abdominal pain or focal neurological deficits.  In the ED was given IV labetalol x 1 at ~ 3AM and currently patient is asymptomatic with last recorded blood pressure of 123/70.  We were consulted for admission evaluation and recommendations.  Review of Systems: 10 point review of systems reviewed and negative unless otherwise mentioned above.  Past Medical History  Diagnosis Date  . Hypertension   . Arthritis   . Cancer   . Diverticulosis of colon   . Glaucoma(365)    Past Surgical History  Procedure Laterality Date  . Breast surgery  1989    mastectmy   Social History:  reports that she has never smoked. She has never used smokeless tobacco. She reports that she does not drink alcohol or use illicit drugs.  Can patient participate in ADLs? yes  Allergies  Allergen Reactions  . Septra (Bactrim) Swelling    Family History  Problem Relation Age of Onset  . Hypertension      family hx  . Lung cancer      family hx  . Stroke      family hx  . Heart disease      family hx    Prior to Admission medications   Medication Sig Start Date End Date Taking?  Authorizing Provider  Calcium Carbonate (CALCARB 600) 1500 MG TABS Take by mouth daily.     Yes Historical Provider, MD  cyanocobalamin (,VITAMIN B-12,) 1000 MCG/ML injection Inject 1,000 mcg into the muscle every 30 (thirty) days.     Yes Historical Provider, MD  ferrous gluconate (FERGON) 325 MG tablet Take 325 mg by mouth daily with breakfast.     Yes Historical Provider, MD  Multiple Vitamin (MULTIVITAMIN) tablet Take 1 tablet by mouth daily.     Yes Historical Provider, MD  ranitidine (ZANTAC) 150 MG capsule Take 150 mg by mouth daily.    Yes Historical Provider, MD  travoprost, benzalkonium, (TRAVATAN) 0.004 % ophthalmic solution Place 1 drop into both eyes at bedtime.     Yes Historical Provider, MD   Physical Exam: Filed Vitals:   09/28/12 0515 09/28/12 0645 09/28/12 0700 09/28/12 0730  BP: 146/67 156/69 155/66 123/70  Pulse: 80  75 74  Temp:      TempSrc:      Resp:      SpO2: 97%  96% 96%     General:  Pt in NAD, Alert and Awake  Eyes: EOMI, non icteric  ENT: normal exterior appearance, no masses on visual examination, MMM  Neck: supple, no goiter  Cardiovascular: RRR, No MRG  Respiratory: CTA BL, speaking in full sentences, no wheezes  Abdomen: soft, NT, ND  Skin: warm and dry  Musculoskeletal: no cyanosis or clubbing  Psychiatric: mood and affect appropriate  Neurologic: answers questions appropriately, moves all extremities evenly  Labs on Admission:  Basic Metabolic Panel:  Recent Labs Lab 09/28/12 0203  NA 145  K 4.1  CL 106  GLUCOSE 98  BUN 17  CREATININE 1.20*   Liver Function Tests: No results found for this basename: AST, ALT, ALKPHOS, BILITOT, PROT, ALBUMIN,  in the last 168 hours No results found for this basename: LIPASE, AMYLASE,  in the last 168 hours No results found for this basename: AMMONIA,  in the last 168 hours CBC:  Recent Labs Lab 09/28/12 0203 09/28/12 0206  WBC  --  5.1  NEUTROABS  --  2.6  HGB 11.9* 11.4*  HCT  35.0* 33.8*  MCV  --  88.7  PLT  --  227   Cardiac Enzymes:  Recent Labs Lab 09/28/12 0206  TROPONINI <0.30    BNP (last 3 results) No results found for this basename: PROBNP,  in the last 8760 hours CBG: No results found for this basename: GLUCAP,  in the last 168 hours  Radiological Exams on Admission: Dg Chest 2 View  09/28/2012  *RADIOLOGY REPORT*  Clinical Data: Hypertension and headache.  CHEST - 2 VIEW  Comparison: 07/03/2005  Findings: Postoperative change of the left mastectomy and surgical clips in the left axilla.  Since the previous study, there appears to be interval enlargement of the right hilum with volume loss and increased density in the right apex.  Suggest CT examination to exclude metastatic disease.  Mild cardiac enlargement with normal pulmonary vascularity.  No blunting of costophrenic angles.  No pneumothorax.  IMPRESSION: Increased prominence of right hilum with volume loss and increased density in the right apex.  CT recommended to exclude metastasis or obstructing lesion.   Original Report Authenticated By: Burman Nieves, M.D.    Ct Head Wo Contrast  09/28/2012  *RADIOLOGY REPORT*  Clinical Data: Headache and hypertension.  CT HEAD WITHOUT CONTRAST  Technique:  Contiguous axial images were obtained from the base of the skull through the vertex without contrast.  Comparison: None.  Findings: Diffuse cerebral atrophy.  Mild ventricular dilatation consistent with central atrophy.  Patchy low attenuation changes in the deep white matter consistent with small vessel ischemia.  No mass effect or midline shift.  No abnormal extra-axial fluid collections.  Gray-white matter junctions are distinct.  Basal cisterns are not effaced.  No evidence of acute intracranial hemorrhage.  Vascular calcifications.  Visualized paranasal sinuses and mastoid air cells are not opacified.  No depressed skull fractures.  IMPRESSION: No acute intracranial abnormalities.  Mild chronic-appearing  atrophy and small vessel ischemic changes.   Original Report Authenticated By: Burman Nieves, M.D.    Ct Chest W Contrast  09/28/2012  *RADIOLOGY REPORT*  Clinical Data: Hypertension.  History of breast cancer with mastectomy.  Abnormal chest x-ray.  CT CHEST WITH CONTRAST  Technique:  Multidetector CT imaging of the chest was performed following the standard protocol during bolus administration of intravenous contrast.  Contrast: 80mL OMNIPAQUE IOHEXOL 300 MG/ML  SOLN  Comparison: Chest 09/28/2012  Findings: There is marked distension and dilatation of the esophagus which is filled with fluid, air, and ingested material. This is consistent with achalasia, dysmotility, or outlet obstruction.  The distal esophagus is narrowed and this could represent inflammatory or neoplastic stricture.  The dilated esophagus accounts for abnormalities demonstrated on chest x-ray.  Mild cardiac enlargement.  Normal caliber  thoracic aorta with calcification.  Prominent central pulmonary vascularity suggest pulmonary arterial hypertension accounts for hilar changes on the chest radiograph.  No significant lymphadenopathy in the chest. Postoperative changes with left mastectomy and surgical clips in the left axilla.  Scattered fibrosis in the lungs.  Dependent atelectasis in the bases.  No pleural effusion.  No pneumothorax. No significant airspace disease or consolidation.  Degenerative changes in the thoracic spine.  No vertebral compression deformities.  No destructive bone lesions.  Visualized portions of the upper abdominal organs demonstrate no gross abnormality.  IMPRESSION:  1.  Distended fluid and gas filled esophagus accounts for chest x- ray abnormality on the right.  Distal narrowing.  This could represent  achalasia or distal obstruction. 2.  Pulmonary arterial hypertension accounts for right hilar changes on chest x-ray. 3.  Scattered fibrosis and dependent atelectasis in the lungs.  No evidence of active pulmonary  disease.   Original Report Authenticated By: Burman Nieves, M.D.     EKG: Independently reviewed. Sinus Rhythm non specific ST wave changes  Assessment/Plan Active Problems:  1.   HTN urgency - At this point blood pressure is 123/70 and even though hctz and norvasc were ordered this blood pressure is such after rest and one time dose of labetalol IV.   - I will not continue hctz and norvasc given normal blood pressure and that patient less than 24 hours ago had blood pressure of 216/100 recorded in the ED.  We will monitor and if blood pressure rises above 140/90 will at that point add antihypertensive medication. - EKG now and prn chest pain (no chest pain on admission or even on presentation per patient) - TSH - Telemetry monitoring Addendum: - Patient had CT of head which was interpreted as no acute intracranial abnormalities.  2. GERD - protonix, stable - Please review CT results above.  Will plan on having speech therapy evaluate. Patient reportedly tolerating po intake. As such would plan on patient f/u with GI as outpatient for further evaluation and recommendations.  3. Anemia - stable continue home regimen Iron supplementation - thought to be 2ary to B 12 deficiency which patient is getting B 12 replacement as outpatient  4. Glaucoma - stable, continue home regimen  5. DVT prophylaxis - scd's   Code Status: full Family Communication: no family at bedside.  Disposition Plan: monitor for 24 hours blood pressure and cardiac enzymes. If blood pressures stable and cardiac enzymes negative d/c next am.  Time spent: > 60 minutes  Penny Pia [facility] Pager [phone]  If 7PM-7AM, please contact night-coverage [email or web address] Password [email or web address] 09/28/2012, 8:02 AM

## 2012-09-28 NOTE — ED Provider Notes (Signed)
Medical screening examination/treatment/procedure(s) were conducted as a shared visit with non-physician practitioner(s) and myself.  I personally evaluated the patient during the encounter. Hypertensive urgency with some epigastric discomfort, workup for possible ACS. Heart regular rate and rhythm, lungs clear bilaterally, no focal deficits on neurologic exam speech clear and equal grips. Plan medical admission  Sunnie Nielsen, MD 09/28/12 [phone]

## 2012-09-28 NOTE — ED Notes (Signed)
PT. REPORTS ELEVATE BLOOD PRESSURE AT HOME THIS EVENING 235/110 WITH SLIGHT HEADACHE , PT. STATES SHE IS NOT TAKING ANTIHYPERTENSIVE MEDICATION FOR 1 YEAR.

## 2012-09-28 NOTE — Progress Notes (Signed)
Utilization Review Completed.   Tarik Teixeira, RN, BSN Nurse Case Manager  [phone]  

## 2012-09-29 ENCOUNTER — Ambulatory Visit: Payer: Medicare Other | Admitting: Family Medicine

## 2012-09-29 ENCOUNTER — Telehealth: Payer: Self-pay | Admitting: *Deleted

## 2012-09-29 LAB — BASIC METABOLIC PANEL
BUN: 12 mg/dL (ref 6–23)
CO2: 29 mEq/L (ref 19–32)
Chloride: 106 mEq/L (ref 96–112)
Creatinine, Ser: 0.95 mg/dL (ref 0.50–1.10)
GFR calc Af Amer: 61 mL/min — ABNORMAL LOW (ref >90)
Glucose, Bld: 84 mg/dL (ref 70–99)
Potassium: 4 mEq/L (ref 3.5–5.1)

## 2012-09-29 LAB — CBC
HCT: 31.1 % — ABNORMAL LOW (ref 36.0–46.0)
Hemoglobin: 10.4 g/dL — ABNORMAL LOW (ref 12.0–15.0)
MCV: 88.9 fL (ref 78.0–100.0)
RBC: 3.5 MIL/uL — ABNORMAL LOW (ref 3.87–5.11)
WBC: 4.8 10*3/uL (ref 4.0–10.5)

## 2012-09-29 MED ORDER — AMLODIPINE BESYLATE 5 MG PO TABS
5.0000 mg | ORAL_TABLET | Freq: Every day | ORAL | Status: DC
  Administered 2012-09-29: 5 mg via ORAL
  Filled 2012-09-29: qty 1

## 2012-09-29 MED ORDER — AMLODIPINE BESYLATE 5 MG PO TABS: 5.0000 mg | ORAL_TABLET | Freq: Every day | ORAL | Status: DC

## 2012-09-29 MED ORDER — FERROUS GLUCONATE 324 (38 FE) MG PO TABS
324.0000 mg | ORAL_TABLET | Freq: Every day | ORAL | Status: DC
  Filled 2012-09-29 (×2): qty 1

## 2012-09-29 NOTE — Progress Notes (Signed)
Nutrition Brief Note  Patient identified on the Malnutrition Screening Tool (MST) Report.  Per weight records, patient has not had any recent weight loss; progressive over last several years.  Wt Readings from Last 10 Encounters:  09/29/12 111 lb 11.2 oz (50.667 kg)  02/28/11 119 lb (53.978 kg)  07/17/10 120 lb 2.1 oz (54.491 kg)  07/11/10 120 lb (54.432 kg)  04/11/10 119 lb (53.978 kg)  06/14/09 126 lb (57.153 kg)  02/22/09 124 lb (56.246 kg)  01/31/09 123 lb (55.792 kg)  05/11/08 129 lb (58.514 kg)  11/28/07 130 lb (58.968 kg)    Body mass index is 21.82 kg/(m^2). Patient meets criteria for Normal based on current BMI.   Current diet order is Heart Healthy, patient is consuming approximately 70% of meals at this time. Labs and medications reviewed.   No nutrition interventions warranted at this time. If nutrition issues arise, please consult RD.   Maureen Chatters, RD, LDN Pager #: [phone] After-Hours Pager #: [phone]

## 2012-09-29 NOTE — Evaluation (Signed)
Clinical/Bedside Swallow Evaluation Patient Details  Name: Veronica Phillips MRN: [id number] Date of Birth: [birth year]  Today's Date: 09/29/2012 Time: 1016-1030 SLP Time Calculation (min): 14 min  Past Medical History:  Past Medical History  Diagnosis Date  . Hypertension   . Arthritis   . Cancer   . Diverticulosis of colon   . Glaucoma(365)    Past Surgical History:  Past Surgical History  Procedure Laterality Date  . Breast surgery  1989    mastectmy   HPI:  Pt is an 79 year old female admitted for HTN urgency. She is tolerating PO, but CT of chest showed Distended fluid and gas filled esophagus accounts for chest x- ray abnormality on the right. Distal narrowing. This could represent achalasia or distal obstruction. D/C already planned, MD requested SLP eval and referral to GI. Chest is otherwise clear.     Assessment / Plan / Recommendation Clinical Impression  Discussed CT findings with pt, she acknowledges history of globus, says she saw GI MD 2 years ago who dx "weak esophagus" per pt. She states she continues to have episodes of globus, but drinks warm liquids and moves until sensation passes. She also reports excessive phlegm and sometimes having to regurgitate food. With SLP observation pt able to consume solids and liquids functionally with no evidence of aspiration and no complaint of globus today. SLP offered compensatory strategies for esophageal dysphagia to pt and family. No SLP f/u required. Pt may need f/u with GI if symptoms worsen subjectively.     Aspiration Risk  Mild    Diet Recommendation Regular;Thin liquid   Liquid Administration via: Cup;Straw Medication Administration: Whole meds with liquid Supervision: Patient able to self feed Compensations: Follow solids with liquid Postural Changes and/or Swallow Maneuvers: Seated upright 90 degrees;Upright 30-60 min after meal    Other  Recommendations Recommended Consults: Consider GI evaluation   Follow Up  Recommendations  None    Frequency and Duration        Pertinent Vitals/Pain NA    SLP Swallow Goals     Swallow Study Prior Functional Status       General HPI: Pt is an 79 year old female admitted for HTN urgency. She is tolerating PO, but CT of chest showed Distended fluid and gas filled esophagus accounts for chest x- ray abnormality on the right. Distal narrowing. This could represent achalasia or distal obstruction. D/C already planned, MD requested SLP eval and referral to GI. Chest is otherwise clear.   Type of Study: Bedside swallow evaluation Previous Swallow Assessment: Pt reports assessment with GI, Office visit note states pt scheduled for upper GI in 2012, could not find result in chart.  Diet Prior to this Study: Regular;Thin liquids History of Recent Intubation: No Behavior/Cognition: Alert;Cooperative;Pleasant mood Oral Cavity - Dentition: Adequate natural dentition Self-Feeding Abilities: Able to feed self Patient Positioning: Upright in bed Baseline Vocal Quality: Clear Volitional Cough: Strong Volitional Swallow: Able to elicit    Oral/Motor/Sensory Function Overall Oral Motor/Sensory Function: Appears within functional limits for tasks assessed   Ice Chips     Thin Liquid Thin Liquid: Within functional limits Presentation: Cup;Straw;Self Fed    Nectar Thick     Honey Thick     Puree Puree: Not tested   Solid   GO    Solid: Within functional limits Presentation: Self Fed      Harlon Ditty, MA CCC-SLP [phone]  Claudine Mouton 09/29/2012,11:08 AM

## 2012-09-29 NOTE — Progress Notes (Signed)
Pt provided with dc instructions and education. Pt verbalized understanding. Pt educated on new medication norvasc and has no questions. IV removed with tip intact. Heart monitor cleaned and returned to front. Levonne Spiller, RN

## 2012-09-29 NOTE — Discharge Summary (Signed)
Physician Discharge Summary  Veronica Phillips YNW:[id number] DOB: 22-Oct-1922 DOA: 09/28/2012  PCP: Evette Georges, MD  Admit date: 09/28/2012 Discharge date: 09/29/2012  Time spent: > 35 minutes  Recommendations for Outpatient Follow-up:  1. Please be sure to follow up with blood pressures and adjust medication if warranted. 2. Consider GI follow up given CT results as listed below.  Discharge Diagnoses:  Principal Problem:   Hypertensive urgency Active Problems:   ANEMIA, B12 DEFICIENCY   GLAUCOMA   GERD   Discharge Condition: stable  Diet recommendation: heart healthy  Filed Weights   09/29/12 0700  Weight: 50.667 kg (111 lb 11.2 oz)    History of present illness:  From original HPI: With h/o HTN reportedly off of antihypertensive medication for 1 year, glaucoma, and arthritis. Presented to the ED after she found her blood pressure to be elevated at home per reports at 235/110. She checked her blood pressure yesterday evening and given elevated BP she came to the ED for further evaluation. The problem was persistent and nothing patient knew of made it better or worse. She states that she had some mild headache she describes as if it was sinus type discomfort. She denies any blurred vision or chest discomfort. Otherwise had no other complaints and denied any abdominal pain or focal neurological deficits.   Hospital Course:  1. HTN urgency - blood pressure has ranged from 136/64 to 155/66.  As such will start patient on amlodipine.  Recommend pcp follow up in 1-2 weeks or sooner should any new concerns arise. - EKG on repeat normal sinus rhythm with no ST elevation or depressions. - TSH  WNL - Telemetry monitoring while in house with no reported red flags. - Patient had CT of head which was interpreted as no acute intracranial abnormalities.   2. GERD  - stable continue home regimen - Please review CT results above. Patient reportedly tolerating po intake. As such would plan  on patient f/u with GI as outpatient for further evaluation and recommendations.   3. Anemia  - stable continue home regimen Iron supplementation  - thought to be 2ary to B 12 deficiency which patient is getting B 12 replacement as outpatient   4. Glaucoma  - stable, continue home regimen   Procedures:  CE's x 3 and EKG's  Consultations:  none  Discharge Exam: Filed Vitals:   09/28/12 1334 09/28/12 2100 09/29/12 0500 09/29/12 0700  BP: 151/78 122/78 136/64   Pulse: 75 83 69   Temp: 98.5 F (36.9 C) 98.4 F (36.9 C) 98.6 F (37 C)   TempSrc: Oral Oral Oral   Resp: 20 18 18    Height:    5' (1.524 m)  Weight:    50.667 kg (111 lb 11.2 oz)  SpO2: 98% 99% 97%     General: Pt in NAD, Alert and Awake Cardiovascular: RRR, No MRG Respiratory: CTA BL, no wheezes  Discharge Instructions  Discharge Orders   Future Appointments Provider Department Dept Phone   10/13/2012 4:30 PM Trenton Gammon, CMA [facility] [phone]   Future Orders Complete By Expires     Call MD for:  extreme fatigue  As directed     Call MD for:  severe uncontrolled pain  As directed     Call MD for:  temperature >100.4  As directed     Diet - low sodium heart healthy  As directed     Discharge instructions  As directed     Comments:  Please be sure to follow up with your primary care physician in 1-2 weeks or sooner should any new concerns arise.    Increase activity slowly  As directed         Medication List    TAKE these medications       amLODipine 5 MG tablet  Commonly known as:  NORVASC  Take 1 tablet (5 mg total) by mouth daily.     BABY ASPIRIN PO  Take 81 mg by mouth 1 day or 1 dose.     CALCARB 600 1500 MG Tabs  Generic drug:  Calcium Carbonate  Take by mouth daily.     Cranberry 200 MG Caps  Take 20 mg by mouth 1 day or 1 dose.     cyanocobalamin 1000 MCG/ML injection  Commonly known as:  (VITAMIN B-12)  Inject 1,000 mcg into the muscle every  30 (thirty) days.     ferrous gluconate 325 MG tablet  Commonly known as:  FERGON  Take 325 mg by mouth daily with breakfast.     multivitamin tablet  Take 1 tablet by mouth daily.     ranitidine 150 MG capsule  Commonly known as:  ZANTAC  Take 150 mg by mouth daily.     travoprost (benzalkonium) 0.004 % ophthalmic solution  Commonly known as:  TRAVATAN  Place 1 drop into both eyes at bedtime.     vitamin E 400 UNIT capsule  Take 400 Units by mouth daily.          The results of significant diagnostics from this hospitalization (including imaging, microbiology, ancillary and laboratory) are listed below for reference.    Significant Diagnostic Studies: Dg Chest 2 View  09/28/2012  *RADIOLOGY REPORT*  Clinical Data: Hypertension and headache.  CHEST - 2 VIEW  Comparison: 07/03/2005  Findings: Postoperative change of the left mastectomy and surgical clips in the left axilla.  Since the previous study, there appears to be interval enlargement of the right hilum with volume loss and increased density in the right apex.  Suggest CT examination to exclude metastatic disease.  Mild cardiac enlargement with normal pulmonary vascularity.  No blunting of costophrenic angles.  No pneumothorax.  IMPRESSION: Increased prominence of right hilum with volume loss and increased density in the right apex.  CT recommended to exclude metastasis or obstructing lesion.   Original Report Authenticated By: Burman Nieves, M.D.    Ct Head Wo Contrast  09/28/2012  *RADIOLOGY REPORT*  Clinical Data: Headache and hypertension.  CT HEAD WITHOUT CONTRAST  Technique:  Contiguous axial images were obtained from the base of the skull through the vertex without contrast.  Comparison: None.  Findings: Diffuse cerebral atrophy.  Mild ventricular dilatation consistent with central atrophy.  Patchy low attenuation changes in the deep white matter consistent with small vessel ischemia.  No mass effect or midline shift.  No  abnormal extra-axial fluid collections.  Gray-white matter junctions are distinct.  Basal cisterns are not effaced.  No evidence of acute intracranial hemorrhage.  Vascular calcifications.  Visualized paranasal sinuses and mastoid air cells are not opacified.  No depressed skull fractures.  IMPRESSION: No acute intracranial abnormalities.  Mild chronic-appearing atrophy and small vessel ischemic changes.   Original Report Authenticated By: Burman Nieves, M.D.    Ct Chest W Contrast  09/28/2012  *RADIOLOGY REPORT*  Clinical Data: Hypertension.  History of breast cancer with mastectomy.  Abnormal chest x-ray.  CT CHEST WITH CONTRAST  Technique:  Multidetector CT imaging of  the chest was performed following the standard protocol during bolus administration of intravenous contrast.  Contrast: 80mL OMNIPAQUE IOHEXOL 300 MG/ML  SOLN  Comparison: Chest 09/28/2012  Findings: There is marked distension and dilatation of the esophagus which is filled with fluid, air, and ingested material. This is consistent with achalasia, dysmotility, or outlet obstruction.  The distal esophagus is narrowed and this could represent inflammatory or neoplastic stricture.  The dilated esophagus accounts for abnormalities demonstrated on chest x-ray.  Mild cardiac enlargement.  Normal caliber thoracic aorta with calcification.  Prominent central pulmonary vascularity suggest pulmonary arterial hypertension accounts for hilar changes on the chest radiograph.  No significant lymphadenopathy in the chest. Postoperative changes with left mastectomy and surgical clips in the left axilla.  Scattered fibrosis in the lungs.  Dependent atelectasis in the bases.  No pleural effusion.  No pneumothorax. No significant airspace disease or consolidation.  Degenerative changes in the thoracic spine.  No vertebral compression deformities.  No destructive bone lesions.  Visualized portions of the upper abdominal organs demonstrate no gross abnormality.   IMPRESSION:  1.  Distended fluid and gas filled esophagus accounts for chest x- ray abnormality on the right.  Distal narrowing.  This could represent  achalasia or distal obstruction. 2.  Pulmonary arterial hypertension accounts for right hilar changes on chest x-ray. 3.  Scattered fibrosis and dependent atelectasis in the lungs.  No evidence of active pulmonary disease.   Original Report Authenticated By: Burman Nieves, M.D.     Microbiology: No results found for this or any previous visit (from the past 240 hour(s)).   Labs: Basic Metabolic Panel:  Recent Labs Lab 09/28/12 0203 09/28/12 1357 09/29/12 0555  NA 145  --  142  K 4.1  --  4.0  CL 106  --  106  CO2  --   --  29  GLUCOSE 98  --  84  BUN 17  --  12  CREATININE 1.20*  --  0.95  CALCIUM  --   --  8.7  MG  --  1.8  --   PHOS  --  3.4  --    Liver Function Tests: No results found for this basename: AST, ALT, ALKPHOS, BILITOT, PROT, ALBUMIN,  in the last 168 hours No results found for this basename: LIPASE, AMYLASE,  in the last 168 hours No results found for this basename: AMMONIA,  in the last 168 hours CBC:  Recent Labs Lab 09/28/12 0203 09/28/12 0206 09/29/12 0555  WBC  --  5.1 4.8  NEUTROABS  --  2.6  --   HGB 11.9* 11.4* 10.4*  HCT 35.0* 33.8* 31.1*  MCV  --  88.7 88.9  PLT  --  227 214   Cardiac Enzymes:  Recent Labs Lab 09/28/12 0206 09/28/12 0814 09/28/12 1357 09/28/12 2004  TROPONINI <0.30 <0.30 <0.30 <0.30   BNP: BNP (last 3 results) No results found for this basename: PROBNP,  in the last 8760 hours CBG: No results found for this basename: GLUCAP,  in the last 168 hours     Signed:  Penny Pia  [facility] 09/29/2012, 10:03 AM

## 2012-09-30 LAB — URINE CULTURE

## 2012-09-30 NOTE — Telephone Encounter (Signed)
Transitional care management   Admit date:09/28/2012 Discharge date"09/29/2012  Discharge diagnoses: Principal problem:   HTN urgency Active problems:  anemia,b12 deficiency  glaucoma  gerd  Discharge condition : stable  Talked with pt and she states she is feeling better.  Had been off her hypertensive meds for  about 1 year and found blood pressure to be elevated at home.  States it was 235/110. Went to ED for evaluation. She was started on norvasc 5 mg qd while in hospital.  Had not take bp the day I called her.  Pt is knowledgeable of her condition and was dicussed with pt the importance of taking norvasc daily.  She appears to understand.Marland Kitchen Appetite is good and she is compliant with her medication.    Pt has an f/u appointment with Dr. Tawanna Cooler for 10/06/2012 at 3:30pm

## 2012-09-30 NOTE — Telephone Encounter (Signed)
Pt called and rescheduled appointment for 4-17

## 2012-10-01 NOTE — Progress Notes (Signed)
09/29/12 1000  SLP G-Codes **NOT FOR INPATIENT CLASS**  Functional Assessment Tool Used clinical judgement  Functional Limitations Swallowing  Swallow Current Status (W0981) CI  Swallow Goal Status (X9147) CI  Swallow Discharge Status (W2956) CI  SLP Evaluations  $ SLP Speech Visit 1 Procedure  SLP Evaluations  $BSS Swallow 1 Procedure  $Swallowing Treatment 1 Procedure

## 2012-10-02 ENCOUNTER — Ambulatory Visit: Payer: Medicare Other | Admitting: Family Medicine

## 2012-10-06 ENCOUNTER — Ambulatory Visit: Payer: Medicare Other | Admitting: Family Medicine

## 2012-10-09 ENCOUNTER — Ambulatory Visit (INDEPENDENT_AMBULATORY_CARE_PROVIDER_SITE_OTHER): Payer: Medicare Other | Admitting: Family Medicine

## 2012-10-09 ENCOUNTER — Encounter: Payer: Self-pay | Admitting: Family Medicine

## 2012-10-09 ENCOUNTER — Ambulatory Visit: Payer: Medicare Other | Admitting: Family Medicine

## 2012-10-09 VITALS — BP 112/64 | Temp 97.9°F | Wt 117.0 lb

## 2012-10-09 DIAGNOSIS — E538 Deficiency of other specified B group vitamins: Secondary | ICD-10-CM

## 2012-10-09 DIAGNOSIS — I160 Hypertensive urgency: Secondary | ICD-10-CM

## 2012-10-09 DIAGNOSIS — R1319 Other dysphagia: Secondary | ICD-10-CM

## 2012-10-09 MED ORDER — AMLODIPINE BESYLATE 2.5 MG PO TABS: 2.5000 mg | ORAL_TABLET | Freq: Every day | ORAL | Status: DC

## 2012-10-09 MED ORDER — CYANOCOBALAMIN 1000 MCG/ML IJ SOLN
1000.0000 ug | Freq: Once | INTRAMUSCULAR | Status: AC
  Administered 2012-10-09: 1000 ug via INTRAMUSCULAR

## 2012-10-09 NOTE — Progress Notes (Signed)
  Subjective:    Patient ID: Veronica Phillips, female    DOB: Feb 22, 1923, 79 y.o.   MRN: [id number]  HPI Veronica Phillips is a 79 year old recently widowed female nonsmoker who comes in today accompanied by her family after recent hospitalization for hypertension  We had stopped her antihypertensive medications about a year ago. She's been monitoring her blood pressure at home and they've all been normal. She took an occasional diuretic for some occasional peripheral edema but stopped that in December.  She presented to the emergency room on April 6 because she checked her blood pressure at home and it was 235/110. She was admitted overnight for observation and blood pressure control. Chest x-rays head CT and CT of the chest are within normal limits except for some evidence of distal esophageal narrowing. She is has difficulty swallowing. She's had this problem in the past and has been seen in GI.  Currently she is on Norvasc 5 mg daily however her blood pressures down to 112/64. At age 78 that's too low.  We also discussed long-term issues. The 2 daughters here today are going to see if mom will go back and forth between their homes so she will have somebody with her 24 7   Review of Systems Review of systems otherwise negative    Objective:   Physical Exam  Well-developed thin female no acute distress BP right arm sitting position 112/64 pulse 70 and regular      Assessment & Plan:  Hypertension,,,,,,,, BP too low decrease Norvasc to 2.5 mg daily  Dysphasia with evidence of distal esophageal narrowing soft diet GI consult ASAP

## 2012-10-09 NOTE — Patient Instructions (Addendum)
Decrease the Norvasc to 2.5 mg daily starting Saturday  No blood pressure medication tomorrow  Soft diet  Increase the Zantac to one twice daily  Call GI to set up an evaluation ASAP  BP checked at home daily in the morning  Return to see me in 2 weeks

## 2012-10-13 ENCOUNTER — Ambulatory Visit: Payer: Medicare Other | Admitting: *Deleted

## 2012-10-16 ENCOUNTER — Ambulatory Visit (INDEPENDENT_AMBULATORY_CARE_PROVIDER_SITE_OTHER): Payer: Medicare Other | Admitting: Gastroenterology

## 2012-10-16 ENCOUNTER — Encounter: Payer: Self-pay | Admitting: Gastroenterology

## 2012-10-16 VITALS — BP 90/50 | HR 56 | Ht 61.0 in | Wt 113.0 lb

## 2012-10-16 DIAGNOSIS — R1319 Other dysphagia: Secondary | ICD-10-CM

## 2012-10-16 DIAGNOSIS — R933 Abnormal findings on diagnostic imaging of other parts of digestive tract: Secondary | ICD-10-CM

## 2012-10-16 NOTE — Patient Instructions (Addendum)
You have been scheduled for a Barium Esophogram at [facility] (1st floor of the hospital) on 10/20/12 at 10:30am. Please arrive 15 minutes prior to your appointment for registration. Make certain not to have anything to eat or drink 6 hours prior to your test. If you need to reschedule for any reason, please contact radiology at [phone] to do so. __________________________________________________________________ A barium swallow is an examination that concentrates on views of the esophagus. This tends to be a double contrast exam (barium and two liquids which, when combined, create a gas to distend the wall of the oesophagus) or single contrast (non-ionic iodine based). The study is usually tailored to your symptoms so a good history is essential. Attention is paid during the study to the form, structure and configuration of the esophagus, looking for functional disorders (such as aspiration, dysphagia, achalasia, motility and reflux) EXAMINATION You may be asked to change into a gown, depending on the type of swallow being performed. A radiologist and radiographer will perform the procedure. The radiologist will advise you of the type of contrast selected for your procedure and direct you during the exam. You will be asked to stand, sit or lie in several different positions and to hold a small amount of fluid in your mouth before being asked to swallow while the imaging is performed .In some instances you may be asked to swallow barium coated marshmallows to assess the motility of a solid food bolus. The exam can be recorded as a digital or video fluoroscopy procedure. POST PROCEDURE It will take 1-2 days for the barium to pass through your system. To facilitate this, it is important, unless otherwise directed, to increase your fluids for the next 24-48hrs and to resume your normal diet.  This test typically takes about 30 minutes to  perform. __________________________________________________________________________________   Veronica Phillips have been scheduled for an esophageal manometry at [facility] on 10/27/12 at 8:00am. Please arrive 30 minutes prior to your procedure for registration. You will need to go to outpatient registration (1st floor of the hospital) first. Make certain to bring your insurance cards as well as a complete list of medications.  Please remember the following:  1) Nothing to eat or drink after 12:00 midnight on the night before your test.  2) Hold all diabetic medications/insulin the morning of the test. You may eat and take your medications after the test.  3) For 3 days prior to your test do not take: Dexilant, Prevacid, Nexium, Protonix, Aciphex, Zegerid, Pantoprazole, Prilosec or omeprazole.  4) For 2 days prior to your test, do not take: Reglan, Tagamet, Zantac, Axid or Pepcid.  5) You MAY use an antacid such as Rolaids or Tums up to 12 hours prior to your test.  It will take at least 2 weeks to receive the results of this test from your physician. ------------------------------------------ ABOUT ESOPHAGEAL MANOMETRY Esophageal manometry (muh-NOM-uh-tree) is a test that gauges how well your esophagus works. Your esophagus is the long, muscular tube that connects your throat to your stomach. Esophageal manometry measures the rhythmic muscle contractions (peristalsis) that occur in your esophagus when you swallow. Esophageal manometry also measures the coordination and force exerted by the muscles of your esophagus.  During esophageal manometry, a thin, flexible tube (catheter) that contains sensors is passed through your nose, down your esophagus and into your stomach. Esophageal manometry can be helpful in diagnosing some mostly uncommon disorders that affect your esophagus.  Why it's done Esophageal manometry is used to evaluate  the movement (motility) of food through the esophagus and into  the stomach. The test measures how well the circular bands of muscle (sphincters) at the top and bottom of your esophagus open and close, as well as the pressure, strength and pattern of the wave of esophageal muscle contractions that moves food along.  What you can expect Esophageal manometry is an outpatient procedure done without sedation. Most people tolerate it well. You may be asked to change into a hospital gown before the test starts.  During esophageal manometry  While you are sitting up, a member of your health care team sprays your throat with a numbing medication or puts numbing gel in your nose or both.  A catheter is guided through your nose into your esophagus. The catheter may be sheathed in a water-filled sleeve. It doesn't interfere with your breathing. However, your eyes may water, and you may gag. You may have a slight nosebleed from irritation.  After the catheter is in place, you may be asked to lie on your back on an exam table, or you may be asked to remain seated.  You then swallow small sips of water. As you do, a computer connected to the catheter records the pressure, strength and pattern of your esophageal muscle contractions.  During the test, you'll be asked to breathe slowly and smoothly, remain as still as possible, and swallow only when you're asked to do so.  A member of your health care team may move the catheter down into your stomach while the catheter continues its measurements.  The catheter then is slowly withdrawn. The test usually lasts 20 to 30 minutes.  After esophageal manometry  When your esophageal manometry is complete, you may return to your normal activities  This test typically takes 30-45 minutes to complete. ________________________________________________________________________________  Thank you for choosing me and [facility].  Venita Lick. Pleas Koch., MD., Clementeen Graham

## 2012-10-16 NOTE — Progress Notes (Signed)
History of Present Illness: This is an 79 year old female accompanied by her daughter. She provides a very limited history and few details but it appears she has liquid and solid food dysphagia that is steadily worsening over the past few years. She underwent upper endoscopy in 2012 which showed a tortuous and spastic esophagus. No stricture was noted and an empiric dilation was performed. She notes about a 15-20 pound weight loss over the past several years. A recent CT scan of the chest showed a fluid/gas filled and dilated esophagus with a possible distal esophageal narrowing. The CT scan was performed for abnormal chest x-ray.  Current Medications, Allergies, Past Medical History, Past Surgical History, Family History and Social History were reviewed in [employer] record.  Physical Exam: General: Well developed , well nourished, frail, elderly, female no acute distress Head: Normocephalic and atraumatic Eyes:  sclerae anicteric, EOMI Ears: Normal auditory acuity Mouth: No deformity or lesions Lungs: Clear throughout to auscultation Heart: Regular rate and rhythm; no murmurs, rubs or bruits Abdomen: Soft, non tender and non distended. No masses, hepatosplenomegaly or hernias noted. Normal Bowel sounds Musculoskeletal: Symmetrical with no gross deformities  Pulses:  Normal pulses noted Extremities: No clubbing, cyanosis, edema or deformities noted Neurological: Alert oriented x 4, grossly nonfocal Psychological:  Alert and cooperative. Normal mood and affect  Assessment and Recommendations:  1. Dysphagia. Abnormal CT of the esophagus. Weight loss. Rule out an esophageal motility disorder or an esophageal stricture. Given her endoscopy findings from 2 years and her progressive liquid and solid dysphagia a motility disorder is most likely. R/O achalasia. Schedule a barium esophagram and an esophageal manometry.  2. Hypertension with recent adjustments in  antihypertensive regimen. Blood pressure today is 90/50. Advised her to contact Dr. Nelida Meuse office today for further advice.

## 2012-10-20 ENCOUNTER — Ambulatory Visit (HOSPITAL_COMMUNITY)
Admission: RE | Admit: 2012-10-20 | Discharge: 2012-10-20 | Disposition: A | Discharge status: Home / Self Care | Payer: Medicare Other | Source: Ambulatory Visit | Attending: Gastroenterology | Admitting: Gastroenterology

## 2012-10-20 DIAGNOSIS — R1310 Dysphagia, unspecified: Secondary | ICD-10-CM | POA: Insufficient documentation

## 2012-10-20 DIAGNOSIS — R1319 Other dysphagia: Secondary | ICD-10-CM

## 2012-10-20 DIAGNOSIS — R9389 Abnormal findings on diagnostic imaging of other specified body structures: Secondary | ICD-10-CM | POA: Insufficient documentation

## 2012-10-20 DIAGNOSIS — K224 Dyskinesia of esophagus: Secondary | ICD-10-CM | POA: Insufficient documentation

## 2012-10-20 DIAGNOSIS — K220 Achalasia of cardia: Secondary | ICD-10-CM | POA: Insufficient documentation

## 2012-10-20 DIAGNOSIS — R933 Abnormal findings on diagnostic imaging of other parts of digestive tract: Secondary | ICD-10-CM

## 2012-10-23 ENCOUNTER — Ambulatory Visit: Payer: Medicare Other | Admitting: Family Medicine

## 2012-10-27 ENCOUNTER — Encounter (HOSPITAL_COMMUNITY)
Admission: RE | Disposition: A | Discharge status: Home / Self Care | Payer: Self-pay | Source: Ambulatory Visit | Attending: Gastroenterology

## 2012-10-27 ENCOUNTER — Encounter (HOSPITAL_COMMUNITY): Payer: Self-pay

## 2012-10-27 ENCOUNTER — Ambulatory Visit (HOSPITAL_COMMUNITY)
Admission: RE | Admit: 2012-10-27 | Discharge: 2012-10-27 | Disposition: A | Discharge status: Home / Self Care | Payer: Medicare Other | Source: Ambulatory Visit | Attending: Gastroenterology | Admitting: Gastroenterology

## 2012-10-27 DIAGNOSIS — Z538 Procedure and treatment not carried out for other reasons: Secondary | ICD-10-CM | POA: Insufficient documentation

## 2012-10-27 DIAGNOSIS — R1319 Other dysphagia: Secondary | ICD-10-CM

## 2012-10-27 DIAGNOSIS — K219 Gastro-esophageal reflux disease without esophagitis: Secondary | ICD-10-CM

## 2012-10-27 DIAGNOSIS — R1310 Dysphagia, unspecified: Secondary | ICD-10-CM | POA: Insufficient documentation

## 2012-10-27 DIAGNOSIS — R933 Abnormal findings on diagnostic imaging of other parts of digestive tract: Secondary | ICD-10-CM | POA: Insufficient documentation

## 2012-10-27 HISTORY — PX: ESOPHAGEAL MANOMETRY: SHX5429

## 2012-10-27 SURGERY — MANOMETRY, ESOPHAGUS

## 2012-10-27 MED ORDER — LIDOCAINE VISCOUS 2 % MT SOLN
OROMUCOSAL | Status: AC
  Filled 2012-10-27: qty 15

## 2012-10-28 ENCOUNTER — Encounter (HOSPITAL_COMMUNITY): Payer: Self-pay | Admitting: Gastroenterology

## 2012-10-28 DIAGNOSIS — R1319 Other dysphagia: Secondary | ICD-10-CM

## 2012-10-28 DIAGNOSIS — I10 Essential (primary) hypertension: Secondary | ICD-10-CM

## 2012-11-06 ENCOUNTER — Encounter: Payer: Self-pay | Admitting: Family Medicine

## 2012-11-06 ENCOUNTER — Ambulatory Visit: Payer: Medicare Other | Admitting: *Deleted

## 2012-11-06 ENCOUNTER — Ambulatory Visit (INDEPENDENT_AMBULATORY_CARE_PROVIDER_SITE_OTHER): Payer: Medicare Other | Admitting: Family Medicine

## 2012-11-06 VITALS — BP 104/70 | Temp 97.7°F | Wt 114.0 lb

## 2012-11-06 DIAGNOSIS — R1310 Dysphagia, unspecified: Secondary | ICD-10-CM

## 2012-11-06 DIAGNOSIS — E538 Deficiency of other specified B group vitamins: Secondary | ICD-10-CM

## 2012-11-06 MED ORDER — CYANOCOBALAMIN 1000 MCG/ML IJ SOLN
1000.0000 ug | Freq: Once | INTRAMUSCULAR | Status: AC
  Administered 2012-11-06: 1000 ug via INTRAMUSCULAR

## 2012-11-06 NOTE — Progress Notes (Signed)
  Subjective:    Patient ID: Veronica Phillips, female    DOB: [birth year], 79 y.o.   MRN: [id number]  HPI Veronica Phillips is a 79 year old female widowed nonsmoker who comes in today for followup of hypertension and a motility disorder  We stopped her Norvasc 2 mg because she was having episodes of lightheadedness. Her blood pressure now is in the 104/70 range but she is not lightheaded any more  She had an upper endoscopy this winter by Dr. Judie Petit. He felt like she most likely had a motility disorder. He gave her some recommendations but she's not exactly sure what they were.   Review of Systems    review of systems negative Objective:   Physical Exam  Well-developed and nourished female no acute distress BP right arm sitting position 104/70      Assessment & Plan:  Normal blood pressure without medication  Esophageal dysmotility,,,,,,,,,, outlined treatment program as prescribed by Dr. Russella Dar

## 2012-11-06 NOTE — Patient Instructions (Signed)
Do not restart the Norvasc,,,,,,,,,, your blood pressure is normal without medication  Stop all the over-the-counter medications except a baby aspirin  Continue vitamin B12 shots 1 cc monthly  The entire reflux measures are,,,,,,,,, nothing to eat or drink for 3 hours prior to bedtime, Zantac 150 mg daily, soft diet, drink lots of water

## 2012-12-01 ENCOUNTER — Encounter (HOSPITAL_COMMUNITY)
Admission: RE | Disposition: A | Discharge status: Home / Self Care | Payer: Self-pay | Source: Ambulatory Visit | Attending: Gastroenterology

## 2012-12-01 ENCOUNTER — Ambulatory Visit (HOSPITAL_COMMUNITY)
Admission: RE | Admit: 2012-12-01 | Discharge: 2012-12-01 | Disposition: A | Discharge status: Home / Self Care | Payer: Medicare Other | Source: Ambulatory Visit | Attending: Gastroenterology | Admitting: Gastroenterology

## 2012-12-01 SURGERY — CANCELLED PROCEDURE

## 2012-12-01 MED ORDER — LIDOCAINE VISCOUS 2 % MT SOLN
OROMUCOSAL | Status: AC
  Filled 2012-12-01: qty 15

## 2012-12-08 ENCOUNTER — Ambulatory Visit (INDEPENDENT_AMBULATORY_CARE_PROVIDER_SITE_OTHER): Payer: Medicare Other | Admitting: *Deleted

## 2012-12-08 DIAGNOSIS — E538 Deficiency of other specified B group vitamins: Secondary | ICD-10-CM

## 2012-12-08 MED ORDER — CYANOCOBALAMIN 1000 MCG/ML IJ SOLN
1000.0000 ug | Freq: Once | INTRAMUSCULAR | Status: AC
  Administered 2012-12-08: 1000 ug via INTRAMUSCULAR

## 2012-12-22 ENCOUNTER — Encounter (HOSPITAL_COMMUNITY): Payer: Self-pay

## 2012-12-22 NOTE — Progress Notes (Signed)
Esophageal Manometry attempted on 12/01/12 for the second time. Unable to pass probe through the hiatal hernia. Two RNs attempted. Study terminated and Dr Russella Dar notified of unsuccessful study.  Riki Rusk, RN

## 2013-01-08 ENCOUNTER — Ambulatory Visit (INDEPENDENT_AMBULATORY_CARE_PROVIDER_SITE_OTHER): Payer: Medicare Other | Admitting: *Deleted

## 2013-01-08 DIAGNOSIS — E538 Deficiency of other specified B group vitamins: Secondary | ICD-10-CM

## 2013-01-08 MED ORDER — CYANOCOBALAMIN 1000 MCG/ML IJ SOLN
1000.0000 ug | Freq: Once | INTRAMUSCULAR | Status: AC
  Administered 2013-01-08: 1000 ug via INTRAMUSCULAR

## 2013-01-23 ENCOUNTER — Telehealth: Payer: Self-pay | Admitting: Gastroenterology

## 2013-01-23 NOTE — Telephone Encounter (Signed)
Phone is busy I will continue to try and reach the patient  

## 2013-01-23 NOTE — Telephone Encounter (Signed)
Patient notified that there were no results, as they were not able pass the catheter into her esophagus.

## 2013-02-05 ENCOUNTER — Ambulatory Visit (INDEPENDENT_AMBULATORY_CARE_PROVIDER_SITE_OTHER): Payer: Medicare Other | Admitting: *Deleted

## 2013-02-05 DIAGNOSIS — E538 Deficiency of other specified B group vitamins: Secondary | ICD-10-CM

## 2013-02-05 MED ORDER — CYANOCOBALAMIN 1000 MCG/ML IJ SOLN
1000.0000 ug | Freq: Once | INTRAMUSCULAR | Status: AC
  Administered 2013-02-05: 1000 ug via INTRAMUSCULAR

## 2013-03-12 ENCOUNTER — Ambulatory Visit (INDEPENDENT_AMBULATORY_CARE_PROVIDER_SITE_OTHER): Payer: Medicare Other | Admitting: *Deleted

## 2013-03-12 DIAGNOSIS — Z23 Encounter for immunization: Secondary | ICD-10-CM

## 2013-03-12 DIAGNOSIS — E538 Deficiency of other specified B group vitamins: Secondary | ICD-10-CM

## 2013-03-12 MED ORDER — CYANOCOBALAMIN 1000 MCG/ML IJ SOLN
1000.0000 ug | Freq: Once | INTRAMUSCULAR | Status: AC
  Administered 2013-03-12: 1000 ug via INTRAMUSCULAR

## 2013-03-13 ENCOUNTER — Ambulatory Visit: Payer: Medicare Other | Admitting: *Deleted

## 2013-04-09 ENCOUNTER — Ambulatory Visit (INDEPENDENT_AMBULATORY_CARE_PROVIDER_SITE_OTHER): Payer: Medicare Other | Admitting: *Deleted

## 2013-04-09 DIAGNOSIS — E538 Deficiency of other specified B group vitamins: Secondary | ICD-10-CM

## 2013-04-09 MED ORDER — CYANOCOBALAMIN 1000 MCG/ML IJ SOLN
1000.0000 ug | Freq: Once | INTRAMUSCULAR | Status: AC
  Administered 2013-04-09: 1000 ug via INTRAMUSCULAR

## 2013-04-09 MED ORDER — CYANOCOBALAMIN 1000 MCG/ML IJ SOLN: 1000.0000 ug | INTRAMUSCULAR | Status: DC

## 2013-05-11 ENCOUNTER — Ambulatory Visit (INDEPENDENT_AMBULATORY_CARE_PROVIDER_SITE_OTHER): Payer: Medicare Other | Admitting: *Deleted

## 2013-05-11 DIAGNOSIS — E538 Deficiency of other specified B group vitamins: Secondary | ICD-10-CM

## 2013-05-11 MED ORDER — CYANOCOBALAMIN 1000 MCG/ML IJ SOLN
1000.0000 ug | Freq: Once | INTRAMUSCULAR | Status: AC
  Administered 2013-05-11: 1000 ug via INTRAMUSCULAR

## 2013-06-09 ENCOUNTER — Ambulatory Visit (INDEPENDENT_AMBULATORY_CARE_PROVIDER_SITE_OTHER): Payer: Medicare Other | Admitting: *Deleted

## 2013-06-09 DIAGNOSIS — E538 Deficiency of other specified B group vitamins: Secondary | ICD-10-CM

## 2013-06-09 MED ORDER — CYANOCOBALAMIN 1000 MCG/ML IJ SOLN
1000.0000 ug | Freq: Once | INTRAMUSCULAR | Status: AC
  Administered 2013-06-09: 1000 ug via INTRAMUSCULAR

## 2013-07-06 ENCOUNTER — Telehealth: Payer: Self-pay | Admitting: Gastroenterology

## 2013-07-06 ENCOUNTER — Telehealth: Payer: Self-pay | Admitting: Family Medicine

## 2013-07-06 NOTE — Telephone Encounter (Signed)
Spoke with pharmacy and they do not carry the 10 ml vial of B 12.  If they do get it again they will inform the patient.  Patient is aware

## 2013-07-06 NOTE — Telephone Encounter (Signed)
Pt requesting to speak with Apolonio Schneiders regarding information she was suppose to get regarding B12.

## 2013-07-06 NOTE — Telephone Encounter (Signed)
Patient with a history of ?? Achalasia.  She was not able to tolerate mano earlier this year.  She is having frequent vomiting.  She will come in tomorrow and see Alonza Bogus, PA at 10:30

## 2013-07-07 ENCOUNTER — Ambulatory Visit (INDEPENDENT_AMBULATORY_CARE_PROVIDER_SITE_OTHER): Payer: Medicare Other | Admitting: Gastroenterology

## 2013-07-07 ENCOUNTER — Encounter: Payer: Self-pay | Admitting: Gastroenterology

## 2013-07-07 VITALS — BP 110/68 | HR 68 | Ht 61.0 in | Wt 110.0 lb

## 2013-07-07 DIAGNOSIS — R1319 Other dysphagia: Secondary | ICD-10-CM

## 2013-07-07 DIAGNOSIS — K220 Achalasia of cardia: Secondary | ICD-10-CM | POA: Insufficient documentation

## 2013-07-07 NOTE — Progress Notes (Signed)
07/07/2013 Veronica Phillips [id number] 05/22/1924   History of Present Illness:  This is an 80 year old female accompanied by her daughter.  She is known to Dr. Fuller Plan for previous issues with solid and liquid dysphagia.  She underwent upper endoscopy in 07/2010, which showed a tortuous and spastic esophagus. No stricture was noted and an empiric dilation was performed.  She returned in 09/2012 with similar, but worsening complaints.  A CT scan of the chest around that time showed a fluid/gas filled and dilated esophagus with a possible distal esophageal narrowing.  Dr. Fuller Plan ordered a barium esophagram, showed marked esophageal dysmotility with achalasia and a 12.5 mm barium tablet would not pass through the area of tapering.  She was scheduled for esophageal manometry to make definitive diagnosis, but the patient could not tolerate that exam.  The issue was never followed up or addressed further after that.  She returns today with her daughter with similar complaints, but worsening particularly over the last few days to a week.     Current Medications, Allergies, Past Medical History, Past Surgical History, Family History and Social History were reviewed in [employer] record.   Physical Exam: BP 110/68  Pulse 68  Ht 5\' 1"  (1.549 m)  Wt 110 lb (49.896 kg)  BMI 20.80 kg/m2 General:  Thin elderly female in no acute distress Head: Normocephalic and atraumatic Eyes:  Sclerae anicteric, conjunctiva pink  Ears: Normal auditory acuity Lungs: Clear throughout to auscultation Heart: Regular rate and rhythm Abdomen: Soft, non-distended.  Normal bowel sounds.  Non-tender. Musculoskeletal: Symmetrical with no gross deformities  Extremities: No edema  Neurological: Alert oriented x 4, grossly non-focal Psychological:  Alert and cooperative. Normal mood and affect  Assessment and Recommendations: -Dysphagia:  Symptoms and radiologic findings consistent with achalasia.  Patient  could not tolerate manometry earlier this year and now symptoms worsening.  Will schedule EGD with Botox injection later this week to see if this gives her relief of her symptoms.  The risks, benefits, and alternatives were discussed with the patient and she consents to proceed.  Will drink Ensure drinks with a liquid diet as tolerated in the interim.

## 2013-07-07 NOTE — Patient Instructions (Signed)
We scheduled the Endoscopy with Dr. Fuller Plan at [facility] for Thursday 07-09-2013. Directions provided.  When eating food, eat small bites and chew well before swallowing.

## 2013-07-07 NOTE — Progress Notes (Signed)
Reviewed and agree with management plan.  Malcolm T. Stark, MD FACG 

## 2013-07-09 ENCOUNTER — Encounter (HOSPITAL_COMMUNITY): Payer: Self-pay

## 2013-07-09 ENCOUNTER — Ambulatory Visit (HOSPITAL_COMMUNITY)
Admission: RE | Admit: 2013-07-09 | Discharge: 2013-07-09 | Disposition: A | Discharge status: Home / Self Care | Payer: Medicare Other | Source: Ambulatory Visit | Attending: Gastroenterology | Admitting: Gastroenterology

## 2013-07-09 ENCOUNTER — Ambulatory Visit: Payer: Medicare Other | Admitting: *Deleted

## 2013-07-09 ENCOUNTER — Encounter (HOSPITAL_COMMUNITY)
Admission: RE | Disposition: A | Discharge status: Home / Self Care | Payer: Self-pay | Source: Ambulatory Visit | Attending: Gastroenterology

## 2013-07-09 DIAGNOSIS — K319 Disease of stomach and duodenum, unspecified: Secondary | ICD-10-CM | POA: Insufficient documentation

## 2013-07-09 DIAGNOSIS — Q409 Congenital malformation of upper alimentary tract, unspecified: Secondary | ICD-10-CM | POA: Insufficient documentation

## 2013-07-09 DIAGNOSIS — K2940 Chronic atrophic gastritis without bleeding: Secondary | ICD-10-CM | POA: Insufficient documentation

## 2013-07-09 DIAGNOSIS — R1310 Dysphagia, unspecified: Secondary | ICD-10-CM | POA: Insufficient documentation

## 2013-07-09 DIAGNOSIS — T18108A Unspecified foreign body in esophagus causing other injury, initial encounter: Secondary | ICD-10-CM | POA: Insufficient documentation

## 2013-07-09 DIAGNOSIS — R1319 Other dysphagia: Secondary | ICD-10-CM

## 2013-07-09 DIAGNOSIS — D131 Benign neoplasm of stomach: Secondary | ICD-10-CM

## 2013-07-09 DIAGNOSIS — K220 Achalasia of cardia: Secondary | ICD-10-CM

## 2013-07-09 DIAGNOSIS — K224 Dyskinesia of esophagus: Secondary | ICD-10-CM | POA: Insufficient documentation

## 2013-07-09 DIAGNOSIS — IMO0002 Reserved for concepts with insufficient information to code with codable children: Secondary | ICD-10-CM | POA: Insufficient documentation

## 2013-07-09 HISTORY — PX: ESOPHAGOGASTRODUODENOSCOPY: SHX5428

## 2013-07-09 HISTORY — PX: BOTOX INJECTION: SHX5754

## 2013-07-09 SURGERY — EGD (ESOPHAGOGASTRODUODENOSCOPY)
Anesthesia: Moderate Sedation

## 2013-07-09 MED ORDER — MIDAZOLAM HCL 10 MG/2ML IJ SOLN
INTRAMUSCULAR | Status: DC | PRN
  Administered 2013-07-09: 2 mg via INTRAVENOUS
  Administered 2013-07-09 (×2): 1 mg via INTRAVENOUS

## 2013-07-09 MED ORDER — SODIUM CHLORIDE 0.9 % IJ SOLN
INTRAMUSCULAR | Status: AC
  Filled 2013-07-09: qty 10

## 2013-07-09 MED ORDER — MIDAZOLAM HCL 10 MG/2ML IJ SOLN
INTRAMUSCULAR | Status: AC
  Filled 2013-07-09: qty 4

## 2013-07-09 MED ORDER — SODIUM CHLORIDE 0.9 % IJ SOLN
100.0000 [IU] | Freq: Once | INTRAMUSCULAR | Status: AC
  Administered 2013-07-09: 100 [IU] via SUBMUCOSAL
  Filled 2013-07-09: qty 100

## 2013-07-09 MED ORDER — SODIUM CHLORIDE 0.9 % IV SOLN
INTRAVENOUS | Status: DC
  Administered 2013-07-09: 500 mL via INTRAVENOUS

## 2013-07-09 MED ORDER — FENTANYL CITRATE 0.05 MG/ML IJ SOLN
INTRAMUSCULAR | Status: DC | PRN
  Administered 2013-07-09 (×2): 25 ug via INTRAVENOUS

## 2013-07-09 MED ORDER — FENTANYL CITRATE 0.05 MG/ML IJ SOLN
INTRAMUSCULAR | Status: AC
  Filled 2013-07-09: qty 2

## 2013-07-09 MED ORDER — BUTAMBEN-TETRACAINE-BENZOCAINE 2-2-14 % EX AERO
INHALATION_SPRAY | CUTANEOUS | Status: DC | PRN
  Administered 2013-07-09: 2 via TOPICAL

## 2013-07-09 NOTE — Discharge Instructions (Signed)
Gastrointestinal Endoscopy °Care After °Refer to this sheet in the next few weeks. These instructions provide you with information on caring for yourself after your procedure. Your caregiver may also give you more specific instructions. Your treatment has been planned according to current medical practices, but problems sometimes occur. Call your caregiver if you have any problems or questions after your procedure. °HOME CARE INSTRUCTIONS °· If you were given medicine to help you relax (sedative), do not drive, operate machinery, or sign important documents for 24 hours. °· Avoid alcohol and hot or warm beverages for the first 24 hours after the procedure. °· Only take over-the-counter or prescription medicines for pain, discomfort, or fever as directed by your caregiver. You may resume taking your normal medicines unless your caregiver tells you otherwise. Ask your caregiver when you may resume taking medicines that may cause bleeding, such as aspirin, clopidogrel, or warfarin. °· You may return to your normal diet and activities on the day after your procedure, or as directed by your caregiver. Walking may help to reduce any bloated feeling in your abdomen. °· Drink enough fluids to keep your urine clear or pale yellow. °· You may gargle with salt water if you have a sore throat. °SEEK IMMEDIATE MEDICAL CARE IF: °· You have severe nausea or vomiting. °· You have severe abdominal pain, abdominal cramps that last longer than 6 hours, or abdominal swelling (distention). °· You have severe shoulder or back pain. °· You have trouble swallowing. °· You have shortness of breath, your breathing is shallow, or you are breathing faster than normal. °· You have a fever or a rapid heartbeat. °· You vomit blood or material that looks like coffee grounds. °· You have bloody, black, or tarry stools. °MAKE SURE YOU: °· Understand these instructions. °· Will watch your condition. °· Will get help right away if you are not doing  well or get worse. °Document Released: 01/24/2004 Document Revised: 12/11/2011 Document Reviewed: 09/11/2011 °ExitCare® Patient Information ©2014 [employer]. ° °

## 2013-07-09 NOTE — Interval H&P Note (Signed)
History and Physical Interval Note:  07/09/2013 8:15 AM  Veronica Phillips  has presented today for surgery, with the diagnosis of dysphagia 787.20 achalasia 530.0  The various methods of treatment have been discussed with the patient and family. After consideration of risks, benefits and other options for treatment, the patient has consented to  Procedure(s): ESOPHAGOGASTRODUODENOSCOPY (EGD) (N/A) BOTOX INJECTION (N/A) as a surgical intervention .  The patient's history has been reviewed, patient examined, no change in status, stable for surgery.  I have reviewed the patient's chart and labs.  Questions were answered to the patient's satisfaction.     Pricilla Riffle. Fuller Plan MD

## 2013-07-09 NOTE — Op Note (Signed)
[facility] [place], [address]   ENDOSCOPY PROCEDURE REPORT  PATIENT: Veronica, Phillips  MR#: [id number] BIRTHDATE: August 27, 1922 , 11  yrs. old GENDER: Female ENDOSCOPIST: Ladene Artist, MD, [facility] PROCEDURE DATE:  07/09/2013 PROCEDURE:  EGD w/ directed submucosal injection(s), any substance and EGD w/ biopsy ASA CLASS:     Class III INDICATIONS:  Dysphagia.   Suspected achalasia. MEDICATIONS: medications were titrated to patient response per physician's verbal order, Fentanyl 50 mcg IV, and Versed 4 mg IV TOPICAL ANESTHETIC: Cetacaine Spray DESCRIPTION OF PROCEDURE: After the risks benefits and alternatives of the procedure were thoroughly explained, informed consent was obtained.  The    endoscope was introduced through the mouth and advanced to the second portion of the duodenum. Without limitations.  The instrument was slowly withdrawn as the mucosa was fully examined.  ESOPHAGUS: The esophagus was tortuous and the proximal esophagus was dilated.  Retained solids in esophagus.  Tight LES.  25units of BoTox injected into 4 quadrants of LES without difficulty. STOMACH: Atrophic gastritis  was found in the gastric body and gastric fundus. Retained solids in the stomach obscuring part of the body and fundus.  Small nodule, 4 mm, was found in the cardia. A biopsy was performed.   The stomach otherwise appeared normal. DUODENUM: The duodenal mucosa showed no abnormalities in the bulb and second portion of the duodenum.  Retroflexed views revealed no abnormalities except the noddule.   The scope was then withdrawn from the patient and the procedure completed.  COMPLICATIONS: There were no complications. ENDOSCOPIC IMPRESSION: 1.   Esophagus was tortuous, retained solids in esophagus, tight LES; 25units of BoTox injected into 4 quadrants of LES without difficulty. 2.   Atrophic gastritis in the gastric body and gastric fundus 3.   Nodule was found in  the cardia; biopsy 4.   Retained gastric solids  RECOMMENDATIONS: 1.  Begin full liquids today and advance diet tomorrow as tolerated 2.  Please call my office if your swallowing has not improved in 1 month 3.  Await pathology   eSigned:  Ladene Artist, MD, [facility] 07/09/2013 8:44 AM

## 2013-07-09 NOTE — H&P (View-Only) (Signed)
07/07/2013 Veronica Phillips [id number] 05/22/1924   History of Present Illness:  This is an 80-year-old female accompanied by her daughter.  She is known to Dr. Stark for previous issues with solid and liquid dysphagia.  She underwent upper endoscopy in 07/2010, which showed a tortuous and spastic esophagus. No stricture was noted and an empiric dilation was performed.  She returned in 09/2012 with similar, but worsening complaints.  A CT scan of the chest around that time showed a fluid/gas filled and dilated esophagus with a possible distal esophageal narrowing.  Dr. Stark ordered a barium esophagram, showed marked esophageal dysmotility with achalasia and a 12.5 mm barium tablet would not pass through the area of tapering.  She was scheduled for esophageal manometry to make definitive diagnosis, but the patient could not tolerate that exam.  The issue was never followed up or addressed further after that.  She returns today with her daughter with similar complaints, but worsening particularly over the last few days to a week.     Current Medications, Allergies, Past Medical History, Past Surgical History, Family History and Social History were reviewed in [facility] electronic medical record.   Physical Exam: BP 110/68  Pulse 68  Ht 5' 1" (1.549 m)  Wt 110 lb (49.896 kg)  BMI 20.80 kg/m2 General:  Thin elderly female in no acute distress Head: Normocephalic and atraumatic Eyes:  Sclerae anicteric, conjunctiva pink  Ears: Normal auditory acuity Lungs: Clear throughout to auscultation Heart: Regular rate and rhythm Abdomen: Soft, non-distended.  Normal bowel sounds.  Non-tender. Musculoskeletal: Symmetrical with no gross deformities  Extremities: No edema  Neurological: Alert oriented x 4, grossly non-focal Psychological:  Alert and cooperative. Normal mood and affect  Assessment and Recommendations: -Dysphagia:  Symptoms and radiologic findings consistent with achalasia.  Patient  could not tolerate manometry earlier this year and now symptoms worsening.  Will schedule EGD with Botox injection later this week to see if this gives her relief of her symptoms.  The risks, benefits, and alternatives were discussed with the patient and she consents to proceed.  Will drink Ensure drinks with a liquid diet as tolerated in the interim.     

## 2013-07-10 ENCOUNTER — Encounter (HOSPITAL_COMMUNITY): Payer: Self-pay | Admitting: Gastroenterology

## 2013-07-13 ENCOUNTER — Encounter: Payer: Self-pay | Admitting: Gastroenterology

## 2013-07-16 ENCOUNTER — Ambulatory Visit: Payer: Medicare Other | Admitting: *Deleted

## 2013-07-17 ENCOUNTER — Ambulatory Visit (INDEPENDENT_AMBULATORY_CARE_PROVIDER_SITE_OTHER): Payer: Medicare Other | Admitting: *Deleted

## 2013-07-17 ENCOUNTER — Telehealth: Payer: Self-pay | Admitting: Gastroenterology

## 2013-07-17 DIAGNOSIS — D518 Other vitamin B12 deficiency anemias: Secondary | ICD-10-CM

## 2013-07-17 MED ORDER — CYANOCOBALAMIN 1000 MCG/ML IJ SOLN
1000.0000 ug | Freq: Once | INTRAMUSCULAR | Status: AC
  Administered 2013-07-17: 1000 ug via INTRAMUSCULAR

## 2013-07-17 NOTE — Telephone Encounter (Signed)
All questions answered They will call back to schedule office visit for 2 months

## 2013-08-17 ENCOUNTER — Ambulatory Visit (INDEPENDENT_AMBULATORY_CARE_PROVIDER_SITE_OTHER): Payer: Medicare Other | Admitting: *Deleted

## 2013-08-17 DIAGNOSIS — E538 Deficiency of other specified B group vitamins: Secondary | ICD-10-CM

## 2013-08-17 MED ORDER — CYANOCOBALAMIN 1000 MCG/ML IJ SOLN
1000.0000 ug | Freq: Once | INTRAMUSCULAR | Status: AC
  Administered 2013-08-17: 1000 ug via INTRAMUSCULAR

## 2013-09-14 ENCOUNTER — Ambulatory Visit (INDEPENDENT_AMBULATORY_CARE_PROVIDER_SITE_OTHER): Payer: Medicare Other | Admitting: *Deleted

## 2013-09-14 DIAGNOSIS — E538 Deficiency of other specified B group vitamins: Secondary | ICD-10-CM

## 2013-09-14 MED ORDER — CYANOCOBALAMIN 1000 MCG/ML IJ SOLN
1000.0000 ug | Freq: Once | INTRAMUSCULAR | Status: AC
  Administered 2013-09-14: 1000 ug via INTRAMUSCULAR

## 2013-09-28 ENCOUNTER — Telehealth: Payer: Self-pay | Admitting: Gastroenterology

## 2013-09-28 DIAGNOSIS — R1319 Other dysphagia: Secondary | ICD-10-CM

## 2013-09-28 DIAGNOSIS — K220 Achalasia of cardia: Secondary | ICD-10-CM

## 2013-09-28 NOTE — Telephone Encounter (Signed)
Can set up EGD with BoTox directly as long as she is not on anticoagulants.

## 2013-09-28 NOTE — Telephone Encounter (Signed)
Patient with dysphagia.  She had botox injection in January 2015.  Ok to set up EGD with botox or office visit?

## 2013-09-29 ENCOUNTER — Other Ambulatory Visit: Payer: Self-pay

## 2013-09-29 DIAGNOSIS — R1319 Other dysphagia: Secondary | ICD-10-CM

## 2013-09-29 DIAGNOSIS — K220 Achalasia of cardia: Secondary | ICD-10-CM

## 2013-09-29 NOTE — Telephone Encounter (Signed)
The listed phone number does not accept incoming calls.  No answer on the home number.  Will attempt to reach the patient

## 2013-09-29 NOTE — Telephone Encounter (Signed)
Patient is not on any blood thinners.  She is scheduled for EGD with botox injection for 10/07/13 1045 at [facility].  She is advised to be NPO after midnight and arrive at [facility] admitting at 9:45. She verbalized understanding of all instructions

## 2013-10-07 ENCOUNTER — Ambulatory Visit (HOSPITAL_COMMUNITY)
Admission: RE | Admit: 2013-10-07 | Discharge: 2013-10-07 | Disposition: A | Discharge status: Home / Self Care | Payer: Medicare Other | Source: Ambulatory Visit | Attending: Gastroenterology | Admitting: Gastroenterology

## 2013-10-07 ENCOUNTER — Encounter (HOSPITAL_COMMUNITY): Payer: Self-pay | Admitting: *Deleted

## 2013-10-07 ENCOUNTER — Encounter (HOSPITAL_COMMUNITY)
Admission: RE | Disposition: A | Discharge status: Home / Self Care | Payer: Self-pay | Source: Ambulatory Visit | Attending: Gastroenterology

## 2013-10-07 DIAGNOSIS — R1319 Other dysphagia: Secondary | ICD-10-CM

## 2013-10-07 DIAGNOSIS — K319 Disease of stomach and duodenum, unspecified: Secondary | ICD-10-CM | POA: Insufficient documentation

## 2013-10-07 DIAGNOSIS — D131 Benign neoplasm of stomach: Secondary | ICD-10-CM

## 2013-10-07 DIAGNOSIS — K220 Achalasia of cardia: Secondary | ICD-10-CM

## 2013-10-07 DIAGNOSIS — R1310 Dysphagia, unspecified: Secondary | ICD-10-CM | POA: Insufficient documentation

## 2013-10-07 DIAGNOSIS — K2940 Chronic atrophic gastritis without bleeding: Secondary | ICD-10-CM | POA: Insufficient documentation

## 2013-10-07 HISTORY — DX: Gastro-esophageal reflux disease without esophagitis: K21.9

## 2013-10-07 HISTORY — PX: ESOPHAGOGASTRODUODENOSCOPY: SHX5428

## 2013-10-07 SURGERY — EGD (ESOPHAGOGASTRODUODENOSCOPY)
Anesthesia: Moderate Sedation

## 2013-10-07 MED ORDER — BUTAMBEN-TETRACAINE-BENZOCAINE 2-2-14 % EX AERO
INHALATION_SPRAY | CUTANEOUS | Status: DC | PRN
  Administered 2013-10-07: 2 via TOPICAL

## 2013-10-07 MED ORDER — FENTANYL CITRATE 0.05 MG/ML IJ SOLN
INTRAMUSCULAR | Status: DC | PRN
  Administered 2013-10-07 (×2): 25 ug via INTRAVENOUS

## 2013-10-07 MED ORDER — MIDAZOLAM HCL 10 MG/2ML IJ SOLN
INTRAMUSCULAR | Status: AC
  Filled 2013-10-07: qty 2

## 2013-10-07 MED ORDER — SODIUM CHLORIDE 0.9 % IV SOLN
INTRAVENOUS | Status: DC
Start: 2013-10-07 — End: 2013-10-07
  Administered 2013-10-07: 500 mL via INTRAVENOUS

## 2013-10-07 MED ORDER — SODIUM CHLORIDE 0.9 % IJ SOLN
INTRAMUSCULAR | Status: AC
  Filled 2013-10-07: qty 10

## 2013-10-07 MED ORDER — ONABOTULINUMTOXINA 100 UNITS IJ SOLR
100.0000 [IU] | Freq: Once | INTRAMUSCULAR | Status: AC
  Administered 2013-10-07: 100 [IU] via INTRAMUSCULAR
  Filled 2013-10-07: qty 100

## 2013-10-07 MED ORDER — DIPHENHYDRAMINE HCL 50 MG/ML IJ SOLN
INTRAMUSCULAR | Status: AC
  Filled 2013-10-07: qty 1

## 2013-10-07 MED ORDER — FENTANYL CITRATE 0.05 MG/ML IJ SOLN
INTRAMUSCULAR | Status: AC
  Filled 2013-10-07: qty 2

## 2013-10-07 MED ORDER — MIDAZOLAM HCL 10 MG/2ML IJ SOLN
INTRAMUSCULAR | Status: DC | PRN
  Administered 2013-10-07 (×2): 2 mg via INTRAVENOUS
  Administered 2013-10-07: 1 mg via INTRAVENOUS

## 2013-10-07 NOTE — H&P (Signed)
History of Present Illness: This is an 80 year old female accompanied by her daughter. She is known to have previous issues with solid and liquid dysphagia. She underwent upper endoscopy in 07/2010, which showed a tortuous and spastic esophagus. No stricture was noted and an empiric dilation was performed. She returned in 09/2012 with similar, but worsening complaints. A CT scan of the chest around that time showed a fluid/gas filled and dilated esophagus with a possible distal esophageal narrowing. Barium esophagram, showed marked esophageal dysmotility with achalasia and a 12.5 mm barium tablet would not pass through the area of tapering. She was scheduled for esophageal manometry to make definitive diagnosis, but the patient could not tolerate that exam. She underwent EGD with BoTox in 06/2013 with some improvement in symptoms but has persistent dysphagia.  Current Medications, Allergies, Past Medical History, Past Surgical History, Family History and Social History were reviewed in [employer] record.   Physical Exam:  General: Thin elderly female in no acute distress  Head: Normocephalic and atraumatic  Eyes: Sclerae anicteric, conjunctiva pink  Ears: Normal auditory acuity  Lungs: Clear throughout to auscultation  Heart: Regular rate and rhythm  Abdomen: Soft, non-distended. Normal bowel sounds. Non-tender.  Musculoskeletal: Symmetrical with no gross deformities  Extremities: No edema  Neurological: Alert oriented x 4, grossly non-focal  Psychological: Alert and cooperative. Normal mood and affect   Assessment and Recommendations:  -Dysphagia: Symptoms and radiologic findings consistent with achalasia. Patient could not tolerate manometry earlier this year and now symptoms worsening. EGD with Botox injection with some improvement but symptoms persist. For repeat EGD with BoTox. The risks, benefits, and alternatives were discussed with the patient and she consents to  proceed. Advised Ensure drinks with a liquid diet when she cannot tolerate solids.

## 2013-10-07 NOTE — Interval H&P Note (Signed)
History and Physical Interval Note:  10/07/2013 10:09 AM  Veronica Phillips  has presented today for surgery, with the diagnosis of achalasia and dysphagia  The various methods of treatment have been discussed with the patient and family. After consideration of risks, benefits and other options for treatment, the patient has consented to  Procedure(s) with comments: ESOPHAGOGASTRODUODENOSCOPY (EGD) (N/A) - wtih botox as a surgical intervention .  The patient's history has been reviewed, patient examined, no change in status, stable for surgery.  I have reviewed the patient's chart and labs.  Questions were answered to the patient's satisfaction.     Ladene Artist MD

## 2013-10-07 NOTE — Op Note (Signed)
[facility] [place], [address]   ENDOSCOPY PROCEDURE REPORT PATIENT: Veronica Phillips, Veronica Phillips  MR#: [id number] BIRTHDATE: 25-May-1923 , 35  yrs. old GENDER: Female ENDOSCOPIST: Ladene Artist, MD, [facility] PROCEDURE DATE:  10/07/2013 PROCEDURE:  EGD w/ biopsy and EGD w/ directed submucosal injection of BoTox ASA CLASS:     Class III INDICATIONS:  Dysphagia.   Therapeutic procedure-BoTox injection for achalasia. MEDICATIONS: These medications were titrated to patient response per physician's verbal order, Fentanyl 50 mcg IV, and Versed 5 mg IV TOPICAL ANESTHETIC: Cetacaine Spray DESCRIPTION OF PROCEDURE: After the risks benefits and alternatives of the procedure were thoroughly explained, informed consent was obtained.  The Pentax Gastroscope [id number] endoscope was introduced through the mouth and advanced to the second portion of the duodenum. Limited by Food and fluid retained in esophagus and stomach..  The instrument was slowly withdrawn as the mucosa was fully examined.  Dilated and tortuous proximal and mid esophagus with retained liquids and solids.  The LES appeared unremarkable, not tight. Dilated and tortuous proximal and mid esophagus with retained liquids and solids.  The LES appeared unremarkable, not tight. ESOPHAGUS: An injection was given to deliver drugs. STOMACH: Atrophic gastritis was found in the gastric body, gastric fundus, and cardia. Retained liquids and solids.  Small nodule, 4 mm,  was found in the cardia.  A polypectomy was performed using cold forceps.  The resection was complete and the polyp tissue was completely retrieved. DUODENUM: The duodenal mucosa showed no abnormalities in the bulb and second portion of the duodenum.  Retroflexed views revealed gastric cardia nodule as described, otherwise negative.  The scope was then withdrawn from the patient and the procedure completed.  COMPLICATIONS: There were no  complications. ENDOSCOPIC IMPRESSION: 1.   Dilated and tortuous proximal and mid esophagus with retained liquids and solids. 2.   LES BoTox injection 3.   Atrophic gastritis with retained gastric liquids and solids 4.   Nodule in the cardia; removed  RECOMMENDATIONS: 1.  Await pathology results 2.  Assess response to 2nd BoTox  eSigned:  Ladene Artist, MD, [facility] 10/07/2013 11:50 AM

## 2013-10-08 ENCOUNTER — Encounter (HOSPITAL_COMMUNITY): Payer: Self-pay | Admitting: Gastroenterology

## 2013-10-08 ENCOUNTER — Encounter: Payer: Self-pay | Admitting: Gastroenterology

## 2013-10-15 ENCOUNTER — Ambulatory Visit (INDEPENDENT_AMBULATORY_CARE_PROVIDER_SITE_OTHER): Payer: Medicare Other | Admitting: *Deleted

## 2013-10-15 DIAGNOSIS — E538 Deficiency of other specified B group vitamins: Secondary | ICD-10-CM

## 2013-10-15 MED ORDER — CYANOCOBALAMIN 1000 MCG/ML IJ SOLN
1000.0000 ug | Freq: Once | INTRAMUSCULAR | Status: AC
  Administered 2013-10-15: 1000 ug via INTRAMUSCULAR

## 2013-11-13 ENCOUNTER — Ambulatory Visit (INDEPENDENT_AMBULATORY_CARE_PROVIDER_SITE_OTHER): Payer: Medicare Other | Admitting: *Deleted

## 2013-11-13 DIAGNOSIS — E538 Deficiency of other specified B group vitamins: Secondary | ICD-10-CM

## 2013-11-13 MED ORDER — CYANOCOBALAMIN 1000 MCG/ML IJ SOLN
1000.0000 ug | Freq: Once | INTRAMUSCULAR | Status: AC
  Administered 2013-11-13: 1000 ug via INTRAMUSCULAR

## 2013-12-14 ENCOUNTER — Ambulatory Visit (INDEPENDENT_AMBULATORY_CARE_PROVIDER_SITE_OTHER): Payer: Medicare Other | Admitting: *Deleted

## 2013-12-14 DIAGNOSIS — E538 Deficiency of other specified B group vitamins: Secondary | ICD-10-CM

## 2013-12-14 MED ORDER — CYANOCOBALAMIN 1000 MCG/ML IJ SOLN
1000.0000 ug | Freq: Once | INTRAMUSCULAR | Status: AC
  Administered 2013-12-14: 1000 ug via INTRAMUSCULAR

## 2014-01-12 ENCOUNTER — Ambulatory Visit (INDEPENDENT_AMBULATORY_CARE_PROVIDER_SITE_OTHER): Payer: Medicare Other | Admitting: *Deleted

## 2014-01-12 DIAGNOSIS — D518 Other vitamin B12 deficiency anemias: Secondary | ICD-10-CM

## 2014-01-12 MED ORDER — CYANOCOBALAMIN 1000 MCG/ML IJ SOLN
1000.0000 ug | Freq: Once | INTRAMUSCULAR | Status: AC
  Administered 2014-01-12: 1000 ug via INTRAMUSCULAR

## 2014-02-12 ENCOUNTER — Encounter: Payer: Self-pay | Admitting: Family Medicine

## 2014-02-12 ENCOUNTER — Ambulatory Visit: Payer: Medicare Other | Admitting: *Deleted

## 2014-02-12 ENCOUNTER — Ambulatory Visit (INDEPENDENT_AMBULATORY_CARE_PROVIDER_SITE_OTHER): Payer: Medicare Other | Admitting: Family Medicine

## 2014-02-12 ENCOUNTER — Telehealth: Payer: Self-pay | Admitting: *Deleted

## 2014-02-12 VITALS — BP 118/66 | HR 72 | Wt 98.0 lb

## 2014-02-12 DIAGNOSIS — R42 Dizziness and giddiness: Secondary | ICD-10-CM

## 2014-02-12 DIAGNOSIS — R634 Abnormal weight loss: Secondary | ICD-10-CM

## 2014-02-12 DIAGNOSIS — R630 Anorexia: Secondary | ICD-10-CM

## 2014-02-12 DIAGNOSIS — E538 Deficiency of other specified B group vitamins: Secondary | ICD-10-CM

## 2014-02-12 MED ORDER — CYANOCOBALAMIN 1000 MCG/ML IJ SOLN
1000.0000 ug | Freq: Once | INTRAMUSCULAR | Status: AC
  Administered 2014-02-12: 1000 ug via INTRAMUSCULAR

## 2014-02-12 MED ORDER — MIRTAZAPINE 15 MG PO TBDP: 15.0000 mg | ORAL_TABLET | Freq: Every day | ORAL | Status: DC

## 2014-02-12 NOTE — Progress Notes (Signed)
Subjective:    Patient ID: Veronica Phillips, female    DOB: [birth year], 80 y.o.   MRN: [id number]  Dizziness Associated symptoms include fatigue. Pertinent negatives include no abdominal pain, chest pain, chills, coughing, fever, nausea or vomiting.  Hypertension Pertinent negatives include no chest pain, palpitations or shortness of breath.   Patient seen as a work in with nonspecific symptoms of dizziness, lightheadedness, fatigue, decreased appetite. Poor historian. Apparently symptoms have been for several months. Her weight is down to 98 pounds from 110 pounds last January. Her chronic problems are history of achalasia, B12 deficiency, GERD, glaucoma. She gets Botox injections for her achalasia. She's had some chronic difficulties swallowing for some time.  Previously treated for hypertension with amlodipine but currently not taking any regular prescription medications.  She denies any recent cough, dyspnea, chest pain, abdominal pain, stool changes. Her daughter thinks she has had some gradual weight loss since loss of husband. She denies any active depression symptoms. Does have difficulty sleeping but sometimes takes frequent naps.  She has occasional orthostatic symptoms and no syncope. Good fluid intake.  Past Medical History  Diagnosis Date  . Hypertension   . Arthritis   . Breast cancer     left  . Diverticulosis of colon   . Glaucoma   . Atrophic gastritis   . GERD (gastroesophageal reflux disease)    Past Surgical History  Procedure Laterality Date  . Breast surgery  1989    mastectomy of left breast  . Esophageal manometry N/A 10/27/2012    Procedure: ESOPHAGEAL MANOMETRY (EM);  Surgeon: Ladene Artist, MD;  Location: [facility];  Service: Endoscopy;  Laterality: N/A;  . Esophagogastroduodenoscopy N/A 07/09/2013    Procedure: ESOPHAGOGASTRODUODENOSCOPY (EGD);  Surgeon: Ladene Artist, MD;  Location: [facility];  Service: Endoscopy;  Laterality: N/A;  . Botox  injection N/A 07/09/2013    Procedure: BOTOX INJECTION;  Surgeon: Ladene Artist, MD;  Location: [facility];  Service: Endoscopy;  Laterality: N/A;  . Esophagogastroduodenoscopy N/A 10/07/2013    Procedure: ESOPHAGOGASTRODUODENOSCOPY (EGD);  Surgeon: Ladene Artist, MD;  Location: [facility];  Service: Endoscopy;  Laterality: N/A;  wtih botox    reports that she has never smoked. She has never used smokeless tobacco. She reports that she does not drink alcohol or use illicit drugs. family history includes Heart disease in an other family member; Hypertension in an other family member; Lung cancer in an other family member; Stroke in an other family member. Allergies  Allergen Reactions  . Septra [Bactrim] Swelling      Review of Systems  Constitutional: Positive for appetite change, fatigue and unexpected weight change. Negative for fever and chills.  Respiratory: Negative for cough, shortness of breath and wheezing.   Cardiovascular: Negative for chest pain, palpitations and leg swelling.  Gastrointestinal: Negative for nausea, vomiting, abdominal pain, diarrhea, constipation and blood in stool.  Genitourinary: Negative for dysuria.  Neurological: Positive for dizziness and light-headedness. Negative for syncope.  Psychiatric/Behavioral: Negative for confusion.       Objective:   Physical Exam  Constitutional: She is oriented to person, place, and time. She appears well-developed and well-nourished.  HENT:  Right Ear: External ear normal.  Left Ear: External ear normal.  Mouth/Throat: Oropharynx is clear and moist.  Neck: Neck supple. No thyromegaly present.  Cardiovascular: Normal rate and regular rhythm.   Pulmonary/Chest: Effort normal and breath sounds normal. No respiratory distress. She has no wheezes. She has no rales.  Abdominal: Soft.  Bowel sounds are normal. She exhibits no distension and no mass. There is no tenderness. There is no rebound and no guarding.    Musculoskeletal: She exhibits no edema.  Lymphadenopathy:    She has no cervical adenopathy.  Neurological: She is alert and oriented to person, place, and time. No cranial nerve deficit.          Assessment & Plan:  Patient has some nonspecific symptoms of lightheadedness, fatigue, loss of appetite and some weight loss. She does have very low blood pressure today with sitting 100/60 and standing difficult to auscultate but around 82/60. Obtain labs with CBC, hepatic, basic metabolic panel, TSH, sedimentation rate. Increase fluid intake. She is currently off antihypertensive medications  Start Remeron 15 mg SolTab one each bedtime. Followup with primary in 3 weeks to reassess.

## 2014-02-12 NOTE — Telephone Encounter (Signed)
Patient request referral  

## 2014-02-12 NOTE — Progress Notes (Signed)
Pre visit review using our clinic review tool, if applicable. No additional management support is needed unless otherwise documented below in the visit note. 

## 2014-02-13 LAB — BASIC METABOLIC PANEL
BUN: 20 mg/dL (ref 6–23)
CO2: 26 mEq/L (ref 19–32)
Calcium: 9.2 mg/dL (ref 8.4–10.5)
Chloride: 105 mEq/L (ref 96–112)
Creatinine, Ser: 1.7 mg/dL — ABNORMAL HIGH (ref 0.4–1.2)
GFR: 35.45 mL/min — AB (ref >60.00)
GLUCOSE: 89 mg/dL (ref 70–99)
POTASSIUM: 3.9 meq/L (ref 3.5–5.1)
SODIUM: 141 meq/L (ref 135–145)

## 2014-02-13 LAB — CBC WITH DIFFERENTIAL/PLATELET
Basophils Absolute: 0 10*3/uL (ref 0.0–0.1)
Basophils Relative: 0.2 % (ref 0.0–3.0)
EOS PCT: 0 % (ref 0.0–5.0)
Eosinophils Absolute: 0 10*3/uL (ref 0.0–0.7)
HCT: 32.5 % — ABNORMAL LOW (ref 36.0–46.0)
HEMOGLOBIN: 10.8 g/dL — AB (ref 12.0–15.0)
LYMPHS ABS: 1.5 10*3/uL (ref 0.7–4.0)
Lymphocytes Relative: 28.8 % (ref 12.0–46.0)
MCHC: 33.3 g/dL (ref 30.0–36.0)
MCV: 91.9 fl (ref 78.0–100.0)
MONOS PCT: 9.1 % (ref 3.0–12.0)
Monocytes Absolute: 0.5 10*3/uL (ref 0.1–1.0)
NEUTROS ABS: 3.2 10*3/uL (ref 1.4–7.7)
Neutrophils Relative %: 61.9 % (ref 43.0–77.0)
Platelets: 232 10*3/uL (ref 150.0–400.0)
RBC: 3.54 Mil/uL — ABNORMAL LOW (ref 3.87–5.11)
RDW: 14.6 % (ref 11.5–15.5)
WBC: 5.2 10*3/uL (ref 4.0–10.5)

## 2014-02-13 LAB — HEPATIC FUNCTION PANEL
ALBUMIN: 3.7 g/dL (ref 3.5–5.2)
ALK PHOS: 44 U/L (ref 39–117)
ALT: 9 U/L (ref 0–35)
AST: 28 U/L (ref 0–37)
Bilirubin, Direct: 0.2 mg/dL (ref 0.0–0.3)
Total Bilirubin: 1.1 mg/dL (ref 0.2–1.2)
Total Protein: 6.4 g/dL (ref 6.0–8.3)

## 2014-02-13 LAB — SEDIMENTATION RATE: Sed Rate: 10 mm/hr (ref 0–22)

## 2014-02-13 LAB — TSH: TSH: 2.75 u[IU]/mL (ref 0.35–4.50)

## 2014-02-15 ENCOUNTER — Other Ambulatory Visit: Payer: Self-pay | Admitting: Family Medicine

## 2014-02-15 DIAGNOSIS — R7989 Other specified abnormal findings of blood chemistry: Secondary | ICD-10-CM

## 2014-03-04 ENCOUNTER — Encounter: Payer: Self-pay | Admitting: Family Medicine

## 2014-03-04 ENCOUNTER — Ambulatory Visit (INDEPENDENT_AMBULATORY_CARE_PROVIDER_SITE_OTHER): Payer: Medicare Other | Admitting: Family Medicine

## 2014-03-04 VITALS — BP 120/80 | Temp 98.0°F | Wt 98.8 lb

## 2014-03-04 DIAGNOSIS — K220 Achalasia of cardia: Secondary | ICD-10-CM

## 2014-03-04 DIAGNOSIS — R609 Edema, unspecified: Secondary | ICD-10-CM

## 2014-03-04 DIAGNOSIS — Z23 Encounter for immunization: Secondary | ICD-10-CM

## 2014-03-04 DIAGNOSIS — R600 Localized edema: Secondary | ICD-10-CM

## 2014-03-04 DIAGNOSIS — D518 Other vitamin B12 deficiency anemias: Secondary | ICD-10-CM

## 2014-03-04 MED ORDER — MIRTAZAPINE 15 MG PO TBDP: 15.0000 mg | ORAL_TABLET | Freq: Every day | ORAL | Status: DC

## 2014-03-04 NOTE — Patient Instructions (Signed)
Remeron........ one daily at bedtime  Stop the aspirin  Return in 2 months for followup

## 2014-03-04 NOTE — Progress Notes (Signed)
   Subjective:    Patient ID: Veronica Phillips, female    DOB: [birth year], 80 y.o.   MRN: [id number]  HPI Tamaria is a 80 year old female widowed who comes into the office today accompanied by her daughter  She was here a couple weeks ago because she felt lightheaded. Exam at that time was negative. Her blood pressures were in the 110-21 range systolic. She's off all of her antihypertensive medicines because her blood pressure dropped too low.  She also has difficulty swallowing. She's had multiple treatments to her esophagus because of achalasia. She's been on a soft diet. She is [place] some extra salt. BP today 120/80  Also on Remeron which seems to help stimulate her appetite somewhat   Review of Systems    review of systems otherwise negative Objective:   Physical Exam  Well-developed and nourished female no acute distress vital signs stable she's afebrile BP right arm sitting position 120/80      Assessment & Plan:   Achalasia......... continue soft diet  Lightheaded......... add salt  Decreased appetite....... continue Remeron daily.

## 2014-03-12 ENCOUNTER — Ambulatory Visit (INDEPENDENT_AMBULATORY_CARE_PROVIDER_SITE_OTHER): Payer: Medicare Other | Admitting: *Deleted

## 2014-03-12 DIAGNOSIS — D518 Other vitamin B12 deficiency anemias: Secondary | ICD-10-CM

## 2014-03-12 MED ORDER — CYANOCOBALAMIN 1000 MCG/ML IJ SOLN
1000.0000 ug | Freq: Once | INTRAMUSCULAR | Status: AC
  Administered 2014-03-12: 1000 ug via INTRAMUSCULAR

## 2014-04-12 ENCOUNTER — Ambulatory Visit (INDEPENDENT_AMBULATORY_CARE_PROVIDER_SITE_OTHER): Payer: Medicare Other | Admitting: *Deleted

## 2014-04-12 ENCOUNTER — Other Ambulatory Visit: Payer: Self-pay | Admitting: Family Medicine

## 2014-04-12 DIAGNOSIS — E538 Deficiency of other specified B group vitamins: Secondary | ICD-10-CM

## 2014-04-12 MED ORDER — VITAMIN B-12 100 MCG PO TABS
1000.0000 ug | ORAL_TABLET | Freq: Every day | ORAL | Status: DC
  Administered 2014-04-12: 1000 ug via ORAL

## 2014-04-12 MED ORDER — CYANOCOBALAMIN 1000 MCG/ML IJ SOLN: INTRAMUSCULAR | Status: DC

## 2014-05-04 ENCOUNTER — Encounter: Payer: Self-pay | Admitting: Family Medicine

## 2014-05-04 ENCOUNTER — Ambulatory Visit (INDEPENDENT_AMBULATORY_CARE_PROVIDER_SITE_OTHER): Payer: Medicare Other | Admitting: Family Medicine

## 2014-05-04 VITALS — BP 120/80 | Temp 98.0°F | Wt 102.5 lb

## 2014-05-04 DIAGNOSIS — K220 Achalasia of cardia: Secondary | ICD-10-CM

## 2014-05-04 MED ORDER — MIRTAZAPINE 15 MG PO TBDP: 15.0000 mg | ORAL_TABLET | Freq: Every day | ORAL | Status: DC

## 2014-05-04 NOTE — Patient Instructions (Signed)
Continue your current medication and diet  Call GI for reevaluation when necessary

## 2014-05-04 NOTE — Progress Notes (Signed)
   Subjective:    Patient ID: Veronica Phillips, female    DOB: 02-11-1923, 80 y.o.   MRN: [id number]  HPI Veronica Phillips is a 80 year old female widowed nonsmoker who comes in today accompanied by her daughter from [place] who is retired from [employer] base for evaluation of weight loss She has a history of achalasia and has had Botox 2 by Dr. Fuller Plan. In September she weighed 99 pounds. We altered her diet try to get her to consume more fat. Weight is up to 102-1/2 pounds. BP 120/80 she says she is not lightheaded when she stands up.  We started on Remeron 15 mg and that is seen to help.   Review of Systems    review of systems otherwise negative Objective:   Physical Exam  Well-developed well-nourished female no acute distress vital signs stable she's afebrile      Assessment & Plan:  Achalasia......... Weight improve with change in diet and Remeron 15 mg daily.............. Continue above treatment options.......... GI consult when necessary

## 2014-05-04 NOTE — Progress Notes (Signed)
Pre visit review using our clinic review tool, if applicable. No additional management support is needed unless otherwise documented below in the visit note. 

## 2014-05-13 ENCOUNTER — Ambulatory Visit (INDEPENDENT_AMBULATORY_CARE_PROVIDER_SITE_OTHER): Payer: Medicare Other | Admitting: *Deleted

## 2014-05-13 DIAGNOSIS — D519 Vitamin B12 deficiency anemia, unspecified: Secondary | ICD-10-CM

## 2014-05-13 MED ORDER — CYANOCOBALAMIN 1000 MCG/ML IJ SOLN
1000.0000 ug | Freq: Once | INTRAMUSCULAR | Status: AC
  Administered 2014-05-13: 1000 ug via INTRAMUSCULAR

## 2014-06-11 ENCOUNTER — Ambulatory Visit (INDEPENDENT_AMBULATORY_CARE_PROVIDER_SITE_OTHER): Payer: Medicare Other | Admitting: *Deleted

## 2014-06-11 DIAGNOSIS — E538 Deficiency of other specified B group vitamins: Secondary | ICD-10-CM

## 2014-06-11 MED ORDER — CYANOCOBALAMIN 1000 MCG/ML IJ SOLN
1000.0000 ug | Freq: Once | INTRAMUSCULAR | Status: AC
  Administered 2014-06-11: 1000 ug via INTRAMUSCULAR

## 2014-06-14 ENCOUNTER — Ambulatory Visit (INDEPENDENT_AMBULATORY_CARE_PROVIDER_SITE_OTHER): Payer: Medicare Other | Admitting: Physician Assistant

## 2014-06-14 ENCOUNTER — Telehealth: Payer: Self-pay | Admitting: Gastroenterology

## 2014-06-14 ENCOUNTER — Encounter: Payer: Self-pay | Admitting: Physician Assistant

## 2014-06-14 VITALS — BP 110/60 | HR 76 | Ht 60.25 in | Wt 101.6 lb

## 2014-06-14 DIAGNOSIS — R1314 Dysphagia, pharyngoesophageal phase: Secondary | ICD-10-CM

## 2014-06-14 DIAGNOSIS — K220 Achalasia of cardia: Secondary | ICD-10-CM

## 2014-06-14 MED ORDER — LANSOPRAZOLE 30 MG PO TBDP: 30.0000 mg | ORAL_TABLET | Freq: Every day | ORAL | Status: DC

## 2014-06-14 NOTE — Patient Instructions (Addendum)
You have been scheduled for an endoscopy at the hospital with Dr. Fuller Plan. Please follow written instructions given to you at your visit today. If you use inhalers (even only as needed), please bring them with you on the day of your procedure.  It is important to eat slowly, taking small bites of food, chewing well and consuming adequate amounts of fluid in between bites to avoid food impaction.  We have sent the following medications to your pharmacy for you to pick up at your convenience: Prevacid Solutab 30 mg

## 2014-06-14 NOTE — Progress Notes (Signed)
Patient ID: Veronica Phillips, female   DOB: 12/31/1922, 80 y.o.   MRN: [id number]     History of Present Illness:    Veronica Phillips is a delightful 80 year old African-American female who is here today accompanied by her daughter. She is known to Dr. Fuller Plan for issues with solid and liquid dysphagia. She had an upper endoscopy in February 2012 that showed a torturous and spastic esophagus. No stricture was noted and an empiric dilation was performed. She returned in April 2014 with similar complaints. A CT scan of the chest around that time showed a fluid/gas filled and dilated esophagus with possible distal esophageal narrowing. She then had a barium esophagram which showed marked esophageal dysmotility with achalasia and a 12.5 mm barium tablet would not pass through the area of tapering. She was then scheduled for esophageal manometry to make a definitive diagnosis, but the patient could not tolerate the exam. She underwent an EGD with directed submucosal injection of Botox on 10/07/2013. She was noted to have dilated and tortuous proximal and mid esophagus with retained liquids and solids. She had atrophic gastritis with retained gastric liquids and solids and a nodule in the cardia which was removed she had an LES Botox injection. Pathology revealed atrophic gastritis. She did fairly well for several months but a couple of weeks ago again began to experience difficulty swallowing solids and liquids. Her skin solid dysphagia has progressed where she is sustaining herself on protein shakes that her daughter makes for her. She must drink her liquid shakes very slowly where she will have some regurgitation. She has no chest pain or abdominal pain.   Past Medical History  Diagnosis Date  . Hypertension   . Arthritis   . Breast cancer     left  . Diverticulosis of colon   . Glaucoma   . Atrophic gastritis   . GERD (gastroesophageal reflux disease)     Past Surgical History  Procedure Laterality Date  . Breast  surgery  1989    mastectomy of left breast  . Esophageal manometry N/A 10/27/2012    Procedure: ESOPHAGEAL MANOMETRY (EM);  Surgeon: Ladene Artist, MD;  Location: [facility];  Service: Endoscopy;  Laterality: N/A;  . Esophagogastroduodenoscopy N/A 07/09/2013    Procedure: ESOPHAGOGASTRODUODENOSCOPY (EGD);  Surgeon: Ladene Artist, MD;  Location: [facility];  Service: Endoscopy;  Laterality: N/A;  . Botox injection N/A 07/09/2013    Procedure: BOTOX INJECTION;  Surgeon: Ladene Artist, MD;  Location: [facility];  Service: Endoscopy;  Laterality: N/A;  . Esophagogastroduodenoscopy N/A 10/07/2013    Procedure: ESOPHAGOGASTRODUODENOSCOPY (EGD);  Surgeon: Ladene Artist, MD;  Location: [facility];  Service: Endoscopy;  Laterality: N/A;  wtih botox   Family History  Problem Relation Age of Onset  . Hypertension      family hx  . Lung cancer      family hx  . Stroke      family hx  . Heart disease      family hx   History  Substance Use Topics  . Smoking status: Never Smoker   . Smokeless tobacco: Never Used  . Alcohol Use: No   Current Outpatient Prescriptions  Medication Sig Dispense Refill  . BABY ASPIRIN PO Take 81 mg by mouth 1 day or 1 dose.    . Calcium Carbonate (CALCARB 600) 1500 MG TABS Take by mouth daily.      . Cranberry 200 MG CAPS Take 20 mg by mouth 1 day or 1 dose.    Marland Kitchen  cyanocobalamin (,VITAMIN B-12,) 1000 MCG/ML injection INJECT 1 ML ( 1,000 MCG TOTAL) INTO THE  MUSCLE EVERY 30 DAYS 30 mL 6  . ferrous gluconate (FERGON) 325 MG tablet Take 325 mg by mouth daily with breakfast.      . mirtazapine (REMERON SOLTAB) 15 MG disintegrating tablet Take 1 tablet (15 mg total) by mouth at bedtime. 100 tablet 3  . Multiple Vitamin (MULTIVITAMIN) tablet Take 1 tablet by mouth daily.      . multivitamin-lutein (OCUVITE-LUTEIN) CAPS capsule Take 1 capsule by mouth daily.    . ranitidine (ZANTAC) 150 MG capsule Take 150 mg by mouth daily.     . travoprost, benzalkonium,  (TRAVATAN) 0.004 % ophthalmic solution Place 1 drop into both eyes at bedtime.      . vitamin E 400 UNIT capsule Take 400 Units by mouth daily.    . lansoprazole (PREVACID SOLUTAB) 30 MG disintegrating tablet Take 1 tablet (30 mg total) by mouth daily. 30 tablet 3   No current facility-administered medications for this visit.   Allergies  Allergen Reactions  . Septra [Bactrim] Swelling      Review of Systems: Gen: Denies any fever, chills, sweats, anorexia, fatigue, weakness, malaise, weight loss, and sleep disorder CV: Denies chest pain, angina, palpitations, syncope, orthopnea, PND, peripheral edema, and claudication. Resp: Denies dyspnea at rest, dyspnea with exercise, cough, sputum, wheezing, coughing up blood, and pleurisy. GI: Denies vomiting blood, jaundice, and fecal incontinence.   Has dysphagia to solids and liquids. GU : Denies urinary burning, blood in urine, urinary frequency, urinary hesitancy, nocturnal urination, and urinary incontinence. MS: Denies joint pain, limitation of movement, and swelling, stiffness, low back pain, extremity pain. Denies muscle weakness, cramps, atrophy.  Derm: Denies rash, itching, dry skin, hives, moles, warts, or unhealing ulcers.  Psych: Denies depression, anxiety, memory loss, suicidal ideation, hallucinations, paranoia, and confusion. Heme: Denies bruising, bleeding, and enlarged lymph nodes. Neuro:  Denies any headaches, dizziness, paresthesia Endo:  Denies any problems with DM, thyroid, adrenal   Studies:      Show images for DG Esophagus     Study Result     *RADIOLOGY REPORT*  Clinical Data:Dilated esophagus on a recent chest CT. Dysphagia with a sensation of food getting stuck in the esophagus  ESOPHAGUS/BARIUM SWALLOW/TABLET STUDY  Fluoroscopy Time: 4 minutes and 0 seconds  Comparison: Chest CT dated 09/28/2012.  Findings: The patient swallowed barium without difficulty. Normal primary esophageal motility  with markedly impaired secondary peristalsis. Tertiary peristaltic activity in the distal half of the esophagus. No hypopharyngeal abnormality.  The esophagus is significantly dilated beginning at the level of the cricopharyngeus muscle and extending to the level of the distal esophagus. There is gradual tapering of the esophagus distally. This smoothly tapers to the marked narrowing at the gastroesophageal junction without an obstructing mass or mucosal irregularity. There are tiny esophageal outpouchings in the region of distal tapering.  The patient swallowed a 12.5 mm in diameter barium tablet without difficulty. This passed normally through the esophagus to the level of the marked distal tapering and would not pass through that region with multiple swallows of water and barium. No gastroesophageal reflux was seen during examination. No masses or ulcerations demonstrated.  IMPRESSION: Marked esophageal dysmotility with achalasia. A 12.5 mm in diameter barium tablet would not pass through the area of the distal tapering.   Original Report Authenticated By: Claudie Revering, M.D.   PROCEDURES: EGD 10/07/13: Dilated and tortuous proximal and mid esophagus with retained liquids and  solids. Atrophic gastritis with retained gastric liquids and solids. Nodule in the cardia removed. LES Botox injection. EGD 07/09/13: Esophagus was tortuous, retained solids and esophagus, tight LES; 25 units of Botox injected into 4 quadrants of LES without difficulty. Atrophic gastritis in the gastric body and gastric fundus. Nodules found in the cardia biopsied. Retained gastric solids.   Physical Exam: General: Pleasant, thin, elderly female in no acute distress Head: Normocephalic and atraumatic Eyes:  sclerae anicteric, conjunctiva pink  Ears: Normal auditory acuity Lungs: Clear throughout to auscultation Heart: Regular rate and rhythm Abdomen: Soft, non distended, non-tender. No masses,  no hepatomegaly. Normal bowel sounds Musculoskeletal: Symmetrical with no gross deformities  Extremities: No edema  Neurological: Alert oriented x 4, grossly nonfocal Psychological:  Alert and cooperative. Normal mood and affect  Assessment and Recommendations: 80 year old female with known history of achalasia now with recurrent dysphagia that is progressing in severity. She has been unable to tolerate manometry. Plan is to schedule patient for EGD with Botox injection as this has provided her with relief of her symptoms in the past. The patient has been reviewed with Dr. Ardis Hughs in Dr. Lynne Leader absence.The risks, benefits, and alternatives to endoscopy with possible biopsy and possible dilation were discussed with the patient and they consent to proceed. In the meantime, patient will be given a trial of Prevacid 15 mg solutabs to use once daily.     Hanny Elsberry, Deloris Ping 06/14/2014,

## 2014-06-14 NOTE — Telephone Encounter (Signed)
Patient will come in today at 2:15 to see Cecille Rubin Hvozdovic, PA

## 2014-06-15 ENCOUNTER — Encounter (HOSPITAL_COMMUNITY): Payer: Self-pay | Admitting: *Deleted

## 2014-06-15 NOTE — Progress Notes (Signed)
i agree with the above note, plan.  At her very advanced age the options are limited, this would be her third botox injection (the first two seemed to help)

## 2014-06-28 ENCOUNTER — Ambulatory Visit (HOSPITAL_COMMUNITY): Payer: Medicare Other | Admitting: Anesthesiology

## 2014-06-28 ENCOUNTER — Ambulatory Visit (HOSPITAL_COMMUNITY)
Admission: RE | Admit: 2014-06-28 | Discharge: 2014-06-28 | Disposition: A | Discharge status: Home / Self Care | Payer: Medicare Other | Source: Ambulatory Visit | Attending: Gastroenterology | Admitting: Gastroenterology

## 2014-06-28 ENCOUNTER — Encounter (HOSPITAL_COMMUNITY)
Admission: RE | Disposition: A | Discharge status: Home / Self Care | Payer: Self-pay | Source: Ambulatory Visit | Attending: Gastroenterology

## 2014-06-28 ENCOUNTER — Encounter (HOSPITAL_COMMUNITY): Payer: Self-pay | Admitting: Anesthesiology

## 2014-06-28 DIAGNOSIS — M1990 Unspecified osteoarthritis, unspecified site: Secondary | ICD-10-CM | POA: Insufficient documentation

## 2014-06-28 DIAGNOSIS — I10 Essential (primary) hypertension: Secondary | ICD-10-CM | POA: Insufficient documentation

## 2014-06-28 DIAGNOSIS — K2940 Chronic atrophic gastritis without bleeding: Secondary | ICD-10-CM | POA: Diagnosis not present

## 2014-06-28 DIAGNOSIS — R1314 Dysphagia, pharyngoesophageal phase: Secondary | ICD-10-CM | POA: Diagnosis not present

## 2014-06-28 DIAGNOSIS — K5730 Diverticulosis of large intestine without perforation or abscess without bleeding: Secondary | ICD-10-CM | POA: Diagnosis not present

## 2014-06-28 DIAGNOSIS — Z888 Allergy status to other drugs, medicaments and biological substances status: Secondary | ICD-10-CM | POA: Insufficient documentation

## 2014-06-28 DIAGNOSIS — H409 Unspecified glaucoma: Secondary | ICD-10-CM | POA: Diagnosis not present

## 2014-06-28 DIAGNOSIS — R1310 Dysphagia, unspecified: Secondary | ICD-10-CM | POA: Insufficient documentation

## 2014-06-28 DIAGNOSIS — K219 Gastro-esophageal reflux disease without esophagitis: Secondary | ICD-10-CM | POA: Diagnosis not present

## 2014-06-28 DIAGNOSIS — K220 Achalasia of cardia: Secondary | ICD-10-CM | POA: Diagnosis not present

## 2014-06-28 DIAGNOSIS — D518 Other vitamin B12 deficiency anemias: Secondary | ICD-10-CM | POA: Diagnosis not present

## 2014-06-28 HISTORY — PX: BOTOX INJECTION: SHX5754

## 2014-06-28 HISTORY — PX: BALLOON DILATION: SHX5330

## 2014-06-28 HISTORY — PX: ESOPHAGOGASTRODUODENOSCOPY (EGD) WITH PROPOFOL: SHX5813

## 2014-06-28 SURGERY — ESOPHAGOGASTRODUODENOSCOPY (EGD) WITH PROPOFOL
Anesthesia: Monitor Anesthesia Care

## 2014-06-28 MED ORDER — LACTATED RINGERS IV SOLN
INTRAVENOUS | Status: DC
  Administered 2014-06-28: 1000 mL via INTRAVENOUS

## 2014-06-28 MED ORDER — LIDOCAINE HCL (CARDIAC) 20 MG/ML IV SOLN
INTRAVENOUS | Status: AC
  Filled 2014-06-28: qty 5

## 2014-06-28 MED ORDER — PROPOFOL INFUSION 10 MG/ML OPTIME
INTRAVENOUS | Status: DC | PRN
  Administered 2014-06-28: 120 ug/kg/min via INTRAVENOUS

## 2014-06-28 MED ORDER — PROPOFOL 10 MG/ML IV BOLUS
INTRAVENOUS | Status: AC
  Filled 2014-06-28: qty 20

## 2014-06-28 MED ORDER — SODIUM CHLORIDE 0.9 % IJ SOLN
INTRAMUSCULAR | Status: AC
  Filled 2014-06-28: qty 10

## 2014-06-28 MED ORDER — SODIUM CHLORIDE 0.9 % IV SOLN: INTRAVENOUS | Status: DC

## 2014-06-28 MED ORDER — PROPOFOL 10 MG/ML IV BOLUS
INTRAVENOUS | Status: DC | PRN
  Administered 2014-06-28: 40 mg via INTRAVENOUS

## 2014-06-28 MED ORDER — LIDOCAINE HCL (CARDIAC) 20 MG/ML IV SOLN
INTRAVENOUS | Status: DC | PRN
  Administered 2014-06-28: 50 mg via INTRAVENOUS

## 2014-06-28 MED ORDER — SODIUM CHLORIDE 0.9 % IJ SOLN
100.0000 [IU] | Freq: Once | INTRAMUSCULAR | Status: AC
  Administered 2014-06-28: 100 [IU] via SUBMUCOSAL
  Filled 2014-06-28: qty 100

## 2014-06-28 SURGICAL SUPPLY — 14 items

## 2014-06-28 NOTE — Transfer of Care (Signed)
Immediate Anesthesia Transfer of Care Note  Patient: Veronica Phillips  Procedure(s) Performed: Procedure(s): ESOPHAGOGASTRODUODENOSCOPY (EGD) WITH PROPOFOL (N/A) BOTOX INJECTION (N/A) BALLOON DILATION (N/A)  Patient Location: PACU  Anesthesia Type:MAC  Level of Consciousness: awake, alert  and oriented  Airway & Oxygen Therapy: Patient Spontanous Breathing and Patient connected to nasal cannula oxygen  Post-op Assessment: Report given to PACU RN and Post -op Vital signs reviewed and stable  Post vital signs: Reviewed and stable  Complications: No apparent anesthesia complications

## 2014-06-28 NOTE — Op Note (Signed)
[place], [id number]   ENDOSCOPY PROCEDURE REPORT  PATIENT: Veronica Phillips, Veronica Phillips  MR#: [id number] BIRTHDATE: 1923-02-16 , 16  yrs. old GENDER: female ENDOSCOPIST: Ladene Artist, MD, [facility] REFERRED BY: PROCEDURE DATE:  06/28/2014 PROCEDURE:  EGD w/ directed submucosal injection(s), any substance  ASA CLASS:     Class III INDICATIONS:  dysphagia and achalasia. MEDICATIONS: Monitored anesthesia care and Per Anesthesia TOPICAL ANESTHETIC: none DESCRIPTION OF PROCEDURE: After the risks benefits and alternatives of the procedure were thoroughly explained, informed consent was obtained.  The Pentax Gastroscope [id number] endoscope was introduced through the mouth and advanced to the second portion of the duodenum , Without limitations.  The instrument was slowly withdrawn as the mucosa was fully examined.    ESOPHAGUS: Dilated, tortuous esophagus with some fluid, secretions and food.  Most was suctioned.   The mucosa of the esophagus appeared normal. 1 cc of BoTox injection into 4 quadrants at LES without difficulty. STOMACH: Atrophic gastritis (inflammation) was found. The stomach was otherwise normal. DUODENUM: The duodenal mucosa showed no abnormalities in the bulb and 2nd part of the duodenum.  Retroflexed views revealed no abnormalities.     The scope was then withdrawn from the patient and the procedure completed.  COMPLICATIONS: There were no immediate complications.  ENDOSCOPIC IMPRESSION: 1.   Dilated, tortuous esophagus c/w achalasia; BoTox injection at LES performed 2.   Atrophic gastritis  RECOMMENDATIONS: 1. Anti-reflux regimen  and continue PPI 2. Assess response to therapy. Consider balloon dilation is dysphagia recurs or persists.  eSigned:  Ladene Artist, MD, [facility] 06/28/2014 11:32 AM

## 2014-06-28 NOTE — Anesthesia Preprocedure Evaluation (Signed)
Anesthesia Evaluation  Patient identified by MRN, date of birth, ID band Patient awake    Reviewed: Allergy & Precautions, H&P , NPO status , Patient's Chart, lab work & pertinent test results  Airway Mallampati: II  TM Distance: >3 FB Neck ROM: Full    Dental no notable dental hx.    Pulmonary neg pulmonary ROS,  breath sounds clear to auscultation  Pulmonary exam normal       Cardiovascular hypertension, negative cardio ROS  Rhythm:Regular Rate:Normal     Neuro/Psych negative neurological ROS  negative psych ROS   GI/Hepatic Neg liver ROS, GERD-  Medicated,  Endo/Other  negative endocrine ROS  Renal/GU negative Renal ROS  negative genitourinary   Musculoskeletal  (+) Arthritis -,   Abdominal   Peds negative pediatric ROS (+)  Hematology  (+) anemia ,   Anesthesia Other Findings   Reproductive/Obstetrics negative OB ROS                             Anesthesia Physical Anesthesia Plan  ASA: II  Anesthesia Plan: MAC   Post-op Pain Management:    Induction: Intravenous  Airway Management Planned:   Additional Equipment:   Intra-op Plan:   Post-operative Plan:   Informed Consent: I have reviewed the patients History and Physical, chart, labs and discussed the procedure including the risks, benefits and alternatives for the proposed anesthesia with the patient or authorized representative who has indicated his/her understanding and acceptance.   Dental advisory given  Plan Discussed with: CRNA  Anesthesia Plan Comments:         Anesthesia Quick Evaluation

## 2014-06-28 NOTE — Interval H&P Note (Signed)
History and Physical Interval Note:  06/28/2014 9:38 AM  Veronica Phillips  has presented today for surgery, with the diagnosis of DYSPHAGIA  ACHALASIA  The various methods of treatment have been discussed with the patient and family. After consideration of risks, benefits and other options for treatment, the patient has consented to  Procedure(s): ESOPHAGOGASTRODUODENOSCOPY (EGD) WITH PROPOFOL (N/A) BOTOX INJECTION (N/A) BALLOON DILATION (N/A) as a surgical intervention .  The patient's history has been reviewed, patient examined, no change in status, stable for surgery.  I have reviewed the patient's chart and labs.  Questions were answered to the patient's satisfaction.     Pricilla Riffle. Fuller Plan MD

## 2014-06-28 NOTE — Anesthesia Postprocedure Evaluation (Signed)
  Anesthesia Post-op Note  Patient: Veronica Phillips  Procedure(s) Performed: Procedure(s) (LRB): ESOPHAGOGASTRODUODENOSCOPY (EGD) WITH PROPOFOL (N/A) BOTOX INJECTION (N/A) BALLOON DILATION (N/A)  Patient Location: PACU  Anesthesia Type: MAC  Level of Consciousness: awake and alert   Airway and Oxygen Therapy: Patient Spontanous Breathing  Post-op Pain: mild  Post-op Assessment: Post-op Vital signs reviewed, Patient's Cardiovascular Status Stable, Respiratory Function Stable, Patent Airway and No signs of Nausea or vomiting  Last Vitals:  Filed Vitals:   06/28/14 1131  BP: 119/45  Pulse: 69  Temp:   Resp:     Post-op Vital Signs: stable   Complications: No apparent anesthesia complications

## 2014-06-28 NOTE — Discharge Instructions (Signed)
Esophagogastroduodenoscopy °Care After °Refer to this sheet in the next few weeks. These instructions provide you with information on caring for yourself after your procedure. Your caregiver may also give you more specific instructions. Your treatment has been planned according to current medical practices, but problems sometimes occur. Call your caregiver if you have any problems or questions after your procedure.  °HOME CARE INSTRUCTIONS °· Do not eat or drink anything until the numbing medicine (local anesthetic) has worn off and your gag reflex has returned. You will know that the local anesthetic has worn off when you can swallow comfortably. °· Do not drive for 12 hours after the procedure or as directed by your caregiver. °· Only take medicines as directed by your caregiver. °SEEK MEDICAL CARE IF:  °· You cannot stop coughing. °· You are not urinating at all or less than usual. °SEEK IMMEDIATE MEDICAL CARE IF: °· You have difficulty swallowing. °· You cannot eat or drink. °· You have worsening throat or chest pain. °· You have dizziness, lightheadedness, or you faint. °· You have nausea or vomiting. °· You have chills. °· You have a fever. °· You have severe abdominal pain. °· You have black, tarry, or bloody stools. °Document Released: 05/28/2012 Document Reviewed: 05/28/2012 °[employer]® Patient Information ©2015 [employer]. This information is not intended to replace advice given to you by your health care provider. Make sure you discuss any questions you have with your health care provider. ° °

## 2014-06-28 NOTE — H&P (View-Only) (Signed)
Patient ID: Veronica Phillips, female   DOB: [birth year], 81 y.o.   MRN: [id number]     History of Present Illness:    Veronica Phillips is a delightful 81 year old African-American female who is here today accompanied by her daughter. She is known to Dr. Fuller Plan for issues with solid and liquid dysphagia. She had an upper endoscopy in February 2012 that showed a torturous and spastic esophagus. No stricture was noted and an empiric dilation was performed. She returned in April 2014 with similar complaints. A CT scan of the chest around that time showed a fluid/gas filled and dilated esophagus with possible distal esophageal narrowing. She then had a barium esophagram which showed marked esophageal dysmotility with achalasia and a 12.5 mm barium tablet would not pass through the area of tapering. She was then scheduled for esophageal manometry to make a definitive diagnosis, but the patient could not tolerate the exam. She underwent an EGD with directed submucosal injection of Botox on 10/07/2013. She was noted to have dilated and tortuous proximal and mid esophagus with retained liquids and solids. She had atrophic gastritis with retained gastric liquids and solids and a nodule in the cardia which was removed she had an LES Botox injection. Pathology revealed atrophic gastritis. She did fairly well for several months but a couple of weeks ago again began to experience difficulty swallowing solids and liquids. Her skin solid dysphagia has progressed where she is sustaining herself on protein shakes that her daughter makes for her. She must drink her liquid shakes very slowly where she will have some regurgitation. She has no chest pain or abdominal pain.   Past Medical History  Diagnosis Date  . Hypertension   . Arthritis   . Breast cancer     left  . Diverticulosis of colon   . Glaucoma   . Atrophic gastritis   . GERD (gastroesophageal reflux disease)     Past Surgical History  Procedure Laterality Date  . Breast  surgery  1989    mastectomy of left breast  . Esophageal manometry N/A 10/27/2012    Procedure: ESOPHAGEAL MANOMETRY (EM);  Surgeon: Ladene Artist, MD;  Location: [facility];  Service: Endoscopy;  Laterality: N/A;  . Esophagogastroduodenoscopy N/A 07/09/2013    Procedure: ESOPHAGOGASTRODUODENOSCOPY (EGD);  Surgeon: Ladene Artist, MD;  Location: [facility];  Service: Endoscopy;  Laterality: N/A;  . Botox injection N/A 07/09/2013    Procedure: BOTOX INJECTION;  Surgeon: Ladene Artist, MD;  Location: [facility];  Service: Endoscopy;  Laterality: N/A;  . Esophagogastroduodenoscopy N/A 10/07/2013    Procedure: ESOPHAGOGASTRODUODENOSCOPY (EGD);  Surgeon: Ladene Artist, MD;  Location: [facility];  Service: Endoscopy;  Laterality: N/A;  wtih botox   Family History  Problem Relation Age of Onset  . Hypertension      family hx  . Lung cancer      family hx  . Stroke      family hx  . Heart disease      family hx   History  Substance Use Topics  . Smoking status: Never Smoker   . Smokeless tobacco: Never Used  . Alcohol Use: No   Current Outpatient Prescriptions  Medication Sig Dispense Refill  . BABY ASPIRIN PO Take 81 mg by mouth 1 day or 1 dose.    . Calcium Carbonate (CALCARB 600) 1500 MG TABS Take by mouth daily.      . Cranberry 200 MG CAPS Take 20 mg by mouth 1 day or 1 dose.    Marland Kitchen  cyanocobalamin (,VITAMIN B-12,) 1000 MCG/ML injection INJECT 1 ML ( 1,000 MCG TOTAL) INTO THE  MUSCLE EVERY 30 DAYS 30 mL 6  . ferrous gluconate (FERGON) 325 MG tablet Take 325 mg by mouth daily with breakfast.      . mirtazapine (REMERON SOLTAB) 15 MG disintegrating tablet Take 1 tablet (15 mg total) by mouth at bedtime. 100 tablet 3  . Multiple Vitamin (MULTIVITAMIN) tablet Take 1 tablet by mouth daily.      . multivitamin-lutein (OCUVITE-LUTEIN) CAPS capsule Take 1 capsule by mouth daily.    . ranitidine (ZANTAC) 150 MG capsule Take 150 mg by mouth daily.     . travoprost, benzalkonium,  (TRAVATAN) 0.004 % ophthalmic solution Place 1 drop into both eyes at bedtime.      . vitamin E 400 UNIT capsule Take 400 Units by mouth daily.    . lansoprazole (PREVACID SOLUTAB) 30 MG disintegrating tablet Take 1 tablet (30 mg total) by mouth daily. 30 tablet 3   No current facility-administered medications for this visit.   Allergies  Allergen Reactions  . Septra [Bactrim] Swelling      Review of Systems: Gen: Denies any fever, chills, sweats, anorexia, fatigue, weakness, malaise, weight loss, and sleep disorder CV: Denies chest pain, angina, palpitations, syncope, orthopnea, PND, peripheral edema, and claudication. Resp: Denies dyspnea at rest, dyspnea with exercise, cough, sputum, wheezing, coughing up blood, and pleurisy. GI: Denies vomiting blood, jaundice, and fecal incontinence.   Has dysphagia to solids and liquids. GU : Denies urinary burning, blood in urine, urinary frequency, urinary hesitancy, nocturnal urination, and urinary incontinence. MS: Denies joint pain, limitation of movement, and swelling, stiffness, low back pain, extremity pain. Denies muscle weakness, cramps, atrophy.  Derm: Denies rash, itching, dry skin, hives, moles, warts, or unhealing ulcers.  Psych: Denies depression, anxiety, memory loss, suicidal ideation, hallucinations, paranoia, and confusion. Heme: Denies bruising, bleeding, and enlarged lymph nodes. Neuro:  Denies any headaches, dizziness, paresthesia Endo:  Denies any problems with DM, thyroid, adrenal   Studies:      Show images for DG Esophagus     Study Result     *RADIOLOGY REPORT*  Clinical Data:Dilated esophagus on a recent chest CT. Dysphagia with a sensation of food getting stuck in the esophagus  ESOPHAGUS/BARIUM SWALLOW/TABLET STUDY  Fluoroscopy Time: 4 minutes and 0 seconds  Comparison: Chest CT dated 09/28/2012.  Findings: The patient swallowed barium without difficulty. Normal primary esophageal motility  with markedly impaired secondary peristalsis. Tertiary peristaltic activity in the distal half of the esophagus. No hypopharyngeal abnormality.  The esophagus is significantly dilated beginning at the level of the cricopharyngeus muscle and extending to the level of the distal esophagus. There is gradual tapering of the esophagus distally. This smoothly tapers to the marked narrowing at the gastroesophageal junction without an obstructing mass or mucosal irregularity. There are tiny esophageal outpouchings in the region of distal tapering.  The patient swallowed a 12.5 mm in diameter barium tablet without difficulty. This passed normally through the esophagus to the level of the marked distal tapering and would not pass through that region with multiple swallows of water and barium. No gastroesophageal reflux was seen during examination. No masses or ulcerations demonstrated.  IMPRESSION: Marked esophageal dysmotility with achalasia. A 12.5 mm in diameter barium tablet would not pass through the area of the distal tapering.   Original Report Authenticated By: Claudie Revering, M.D.   PROCEDURES: EGD 10/07/13: Dilated and tortuous proximal and mid esophagus with retained liquids and  solids. Atrophic gastritis with retained gastric liquids and solids. Nodule in the cardia removed. LES Botox injection. EGD 07/09/13: Esophagus was tortuous, retained solids and esophagus, tight LES; 25 units of Botox injected into 4 quadrants of LES without difficulty. Atrophic gastritis in the gastric body and gastric fundus. Nodules found in the cardia biopsied. Retained gastric solids.   Physical Exam: General: Pleasant, thin, elderly female in no acute distress Head: Normocephalic and atraumatic Eyes:  sclerae anicteric, conjunctiva pink  Ears: Normal auditory acuity Lungs: Clear throughout to auscultation Heart: Regular rate and rhythm Abdomen: Soft, non distended, non-tender. No masses,  no hepatomegaly. Normal bowel sounds Musculoskeletal: Symmetrical with no gross deformities  Extremities: No edema  Neurological: Alert oriented x 4, grossly nonfocal Psychological:  Alert and cooperative. Normal mood and affect  Assessment and Recommendations: 81 year old female with known history of achalasia now with recurrent dysphagia that is progressing in severity. She has been unable to tolerate manometry. Plan is to schedule patient for EGD with Botox injection as this has provided her with relief of her symptoms in the past. The patient has been reviewed with Dr. Ardis Hughs in Dr. Lynne Leader absence.The risks, benefits, and alternatives to endoscopy with possible biopsy and possible dilation were discussed with the patient and they consent to proceed. In the meantime, patient will be given a trial of Prevacid 15 mg solutabs to use once daily.     Ariely Riddell, Deloris Ping 06/14/2014,

## 2014-06-29 ENCOUNTER — Encounter (HOSPITAL_COMMUNITY): Payer: Self-pay | Admitting: Gastroenterology

## 2014-07-05 ENCOUNTER — Telehealth: Payer: Self-pay | Admitting: *Deleted

## 2014-07-05 MED ORDER — LANSOPRAZOLE 30 MG PO CPDR: DELAYED_RELEASE_CAPSULE | ORAL | Status: DC

## 2014-07-05 NOTE — Telephone Encounter (Signed)
See note from 07-05-2014.

## 2014-07-05 NOTE — Telephone Encounter (Signed)
LM for Jack Quarto on cell phone, asked her to please call me to advise she got this message.  I advised we sent a prescription to [facility], Prevacid 30 mg capsules, take 1 daily.

## 2014-07-13 ENCOUNTER — Ambulatory Visit: Payer: Medicare Other

## 2014-07-13 ENCOUNTER — Ambulatory Visit (INDEPENDENT_AMBULATORY_CARE_PROVIDER_SITE_OTHER): Payer: Medicare Other | Admitting: *Deleted

## 2014-07-13 DIAGNOSIS — E538 Deficiency of other specified B group vitamins: Secondary | ICD-10-CM

## 2014-07-13 MED ORDER — CYANOCOBALAMIN 1000 MCG/ML IJ SOLN
1000.0000 ug | Freq: Once | INTRAMUSCULAR | Status: AC
  Administered 2014-07-13: 1000 ug via INTRAMUSCULAR

## 2014-08-13 ENCOUNTER — Ambulatory Visit (INDEPENDENT_AMBULATORY_CARE_PROVIDER_SITE_OTHER): Payer: Medicare Other | Admitting: *Deleted

## 2014-08-13 DIAGNOSIS — E538 Deficiency of other specified B group vitamins: Secondary | ICD-10-CM

## 2014-08-16 MED ORDER — CYANOCOBALAMIN 1000 MCG/ML IJ SOLN
1000.0000 ug | Freq: Once | INTRAMUSCULAR | Status: AC
  Administered 2014-08-13: 1000 ug via INTRAMUSCULAR

## 2014-09-13 ENCOUNTER — Ambulatory Visit (INDEPENDENT_AMBULATORY_CARE_PROVIDER_SITE_OTHER): Payer: Medicare Other | Admitting: *Deleted

## 2014-09-13 DIAGNOSIS — D518 Other vitamin B12 deficiency anemias: Secondary | ICD-10-CM

## 2014-09-13 MED ORDER — CYANOCOBALAMIN 1000 MCG/ML IJ SOLN
1000.0000 ug | Freq: Once | INTRAMUSCULAR | Status: AC
  Administered 2014-09-13: 1000 ug via INTRAMUSCULAR

## 2014-10-11 ENCOUNTER — Ambulatory Visit (INDEPENDENT_AMBULATORY_CARE_PROVIDER_SITE_OTHER): Payer: Medicare Other | Admitting: *Deleted

## 2014-10-11 DIAGNOSIS — E538 Deficiency of other specified B group vitamins: Secondary | ICD-10-CM | POA: Diagnosis not present

## 2014-10-11 MED ORDER — CYANOCOBALAMIN 1000 MCG/ML IJ SOLN
1000.0000 ug | Freq: Once | INTRAMUSCULAR | Status: AC
  Administered 2014-10-11: 1000 ug via INTRAMUSCULAR

## 2014-11-05 ENCOUNTER — Encounter (HOSPITAL_COMMUNITY): Payer: Self-pay | Admitting: Family Medicine

## 2014-11-05 ENCOUNTER — Emergency Department (HOSPITAL_COMMUNITY): Payer: Medicare Other

## 2014-11-05 ENCOUNTER — Observation Stay (HOSPITAL_COMMUNITY)
Admission: EM | Admit: 2014-11-05 | Discharge: 2014-11-06 | Disposition: A | Discharge status: Home / Self Care | Payer: Medicare Other | Attending: Internal Medicine | Admitting: Internal Medicine

## 2014-11-05 ENCOUNTER — Telehealth: Payer: Self-pay | Admitting: Family Medicine

## 2014-11-05 DIAGNOSIS — K220 Achalasia of cardia: Secondary | ICD-10-CM | POA: Diagnosis not present

## 2014-11-05 DIAGNOSIS — K5730 Diverticulosis of large intestine without perforation or abscess without bleeding: Secondary | ICD-10-CM | POA: Diagnosis not present

## 2014-11-05 DIAGNOSIS — Z853 Personal history of malignant neoplasm of breast: Secondary | ICD-10-CM | POA: Diagnosis not present

## 2014-11-05 DIAGNOSIS — I10 Essential (primary) hypertension: Secondary | ICD-10-CM

## 2014-11-05 DIAGNOSIS — K219 Gastro-esophageal reflux disease without esophagitis: Secondary | ICD-10-CM | POA: Diagnosis not present

## 2014-11-05 DIAGNOSIS — K2940 Chronic atrophic gastritis without bleeding: Secondary | ICD-10-CM | POA: Diagnosis not present

## 2014-11-05 DIAGNOSIS — Z79899 Other long term (current) drug therapy: Secondary | ICD-10-CM | POA: Diagnosis not present

## 2014-11-05 DIAGNOSIS — H409 Unspecified glaucoma: Secondary | ICD-10-CM | POA: Diagnosis not present

## 2014-11-05 DIAGNOSIS — R079 Chest pain, unspecified: Principal | ICD-10-CM | POA: Insufficient documentation

## 2014-11-05 DIAGNOSIS — M1990 Unspecified osteoarthritis, unspecified site: Secondary | ICD-10-CM | POA: Diagnosis not present

## 2014-11-05 DIAGNOSIS — R072 Precordial pain: Secondary | ICD-10-CM | POA: Diagnosis not present

## 2014-11-05 HISTORY — DX: Deficiency of other specified B group vitamins: E53.8

## 2014-11-05 LAB — FERRITIN: Ferritin: 722 ng/mL — ABNORMAL HIGH (ref 11–307)

## 2014-11-05 LAB — BASIC METABOLIC PANEL
ANION GAP: 8 (ref 5–15)
BUN: 16 mg/dL (ref 6–20)
CO2: 26 mmol/L (ref 22–32)
CREATININE: 1.17 mg/dL — AB (ref 0.44–1.00)
Calcium: 9.1 mg/dL (ref 8.9–10.3)
Chloride: 108 mmol/L (ref 101–111)
GFR calc Af Amer: 46 mL/min — ABNORMAL LOW (ref >60)
GFR, EST NON AFRICAN AMERICAN: 40 mL/min — AB (ref >60)
Glucose, Bld: 96 mg/dL (ref 65–99)
Potassium: 3.4 mmol/L — ABNORMAL LOW (ref 3.5–5.1)
Sodium: 142 mmol/L (ref 135–145)

## 2014-11-05 LAB — CBC
HCT: 34.5 % — ABNORMAL LOW (ref 36.0–46.0)
HEMOGLOBIN: 11.3 g/dL — AB (ref 12.0–15.0)
MCH: 29.7 pg (ref 26.0–34.0)
MCHC: 32.8 g/dL (ref 30.0–36.0)
MCV: 90.6 fL (ref 78.0–100.0)
PLATELETS: 241 10*3/uL (ref 150–400)
RBC: 3.81 MIL/uL — ABNORMAL LOW (ref 3.87–5.11)
RDW: 14.4 % (ref 11.5–15.5)
WBC: 3.8 10*3/uL — AB (ref 4.0–10.5)

## 2014-11-05 LAB — IRON AND TIBC
IRON: 67 ug/dL (ref 28–170)
Saturation Ratios: 28 % (ref 10.4–31.8)
TIBC: 239 ug/dL — AB (ref 250–450)
UIBC: 172 ug/dL

## 2014-11-05 LAB — TSH: TSH: 3.435 u[IU]/mL (ref 0.350–4.500)

## 2014-11-05 LAB — I-STAT TROPONIN, ED: Troponin i, poc: 0 ng/mL (ref 0.00–0.08)

## 2014-11-05 LAB — TROPONIN I

## 2014-11-05 LAB — MAGNESIUM: MAGNESIUM: 1.5 mg/dL — AB (ref 1.7–2.4)

## 2014-11-05 MED ORDER — MIRTAZAPINE 15 MG PO TBDP
15.0000 mg | ORAL_TABLET | Freq: Every day | ORAL | Status: DC
  Administered 2014-11-05: 15 mg via ORAL
  Filled 2014-11-05 (×2): qty 1

## 2014-11-05 MED ORDER — ACETAMINOPHEN 650 MG RE SUPP: 650.0000 mg | Freq: Four times a day (QID) | RECTAL | Status: DC | PRN

## 2014-11-05 MED ORDER — VITAMIN E 180 MG (400 UNIT) PO CAPS
400.0000 [IU] | ORAL_CAPSULE | Freq: Every day | ORAL | Status: DC
  Administered 2014-11-05 – 2014-11-06 (×2): 400 [IU] via ORAL
  Filled 2014-11-05 (×2): qty 1

## 2014-11-05 MED ORDER — SODIUM CHLORIDE 0.9 % IJ SOLN
3.0000 mL | Freq: Two times a day (BID) | INTRAMUSCULAR | Status: DC
  Administered 2014-11-06: 3 mL via INTRAVENOUS

## 2014-11-05 MED ORDER — ACETAMINOPHEN 325 MG PO TABS
650.0000 mg | ORAL_TABLET | Freq: Four times a day (QID) | ORAL | Status: DC | PRN
Start: 2014-11-05 — End: 2014-11-06

## 2014-11-05 MED ORDER — ONDANSETRON HCL 4 MG PO TABS: 4.0000 mg | ORAL_TABLET | Freq: Four times a day (QID) | ORAL | Status: DC | PRN

## 2014-11-05 MED ORDER — CRANBERRY 200 MG PO CAPS: 20.0000 mg | ORAL_CAPSULE | Freq: Every day | ORAL | Status: DC

## 2014-11-05 MED ORDER — LANSOPRAZOLE 30 MG PO TBDP: 30.0000 mg | ORAL_TABLET | Freq: Every day | ORAL | Status: DC

## 2014-11-05 MED ORDER — POTASSIUM CHLORIDE CRYS ER 20 MEQ PO TBCR
20.0000 meq | EXTENDED_RELEASE_TABLET | Freq: Once | ORAL | Status: AC
  Administered 2014-11-05: 20 meq via ORAL
  Filled 2014-11-05: qty 1

## 2014-11-05 MED ORDER — TRAVOPROST 0.004 % OP SOLN: 1.0000 [drp] | Freq: Every day | OPHTHALMIC | Status: DC

## 2014-11-05 MED ORDER — AMLODIPINE BESYLATE 5 MG PO TABS
5.0000 mg | ORAL_TABLET | Freq: Every day | ORAL | Status: DC
Start: 2014-11-05 — End: 2014-11-06
  Administered 2014-11-05 – 2014-11-06 (×2): 5 mg via ORAL
  Filled 2014-11-05 (×2): qty 1

## 2014-11-05 MED ORDER — PANTOPRAZOLE SODIUM 40 MG PO TBEC
40.0000 mg | DELAYED_RELEASE_TABLET | Freq: Every day | ORAL | Status: DC
  Administered 2014-11-05 – 2014-11-06 (×2): 40 mg via ORAL
  Filled 2014-11-05 (×2): qty 1

## 2014-11-05 MED ORDER — LATANOPROST 0.005 % OP SOLN
1.0000 [drp] | Freq: Every day | OPHTHALMIC | Status: DC
  Administered 2014-11-05: 1 [drp] via OPHTHALMIC
  Filled 2014-11-05: qty 2.5

## 2014-11-05 MED ORDER — ENSURE ENLIVE PO LIQD
237.0000 mL | Freq: Two times a day (BID) | ORAL | Status: DC
  Administered 2014-11-06: 237 mL via ORAL

## 2014-11-05 MED ORDER — NITROGLYCERIN 2 % TD OINT
0.5000 [in_us] | TOPICAL_OINTMENT | Freq: Once | TRANSDERMAL | Status: AC
  Administered 2014-11-05: 0.5 [in_us] via TOPICAL
  Filled 2014-11-05: qty 1

## 2014-11-05 MED ORDER — FERROUS GLUCONATE 324 (38 FE) MG PO TABS
325.0000 mg | ORAL_TABLET | Freq: Every day | ORAL | Status: DC
  Administered 2014-11-06: 325 mg via ORAL
  Filled 2014-11-05 (×2): qty 1

## 2014-11-05 MED ORDER — ONE-DAILY MULTI VITAMINS PO TABS
1.0000 | ORAL_TABLET | Freq: Every day | ORAL | Status: DC
  Administered 2014-11-05 – 2014-11-06 (×2): 1 via ORAL
  Filled 2014-11-05 (×2): qty 1

## 2014-11-05 MED ORDER — ASPIRIN EC 325 MG PO TBEC
325.0000 mg | DELAYED_RELEASE_TABLET | Freq: Every day | ORAL | Status: DC
  Administered 2014-11-06: 325 mg via ORAL
  Filled 2014-11-05: qty 1

## 2014-11-05 MED ORDER — ENOXAPARIN SODIUM 40 MG/0.4ML ~~LOC~~ SOLN
40.0000 mg | SUBCUTANEOUS | Status: DC
  Administered 2014-11-05: 40 mg via SUBCUTANEOUS
  Filled 2014-11-05: qty 0.4

## 2014-11-05 MED ORDER — ASPIRIN 81 MG PO CHEW
324.0000 mg | CHEWABLE_TABLET | Freq: Once | ORAL | Status: AC
  Administered 2014-11-05: 324 mg via ORAL
  Filled 2014-11-05: qty 4

## 2014-11-05 MED ORDER — ONDANSETRON HCL 4 MG/2ML IJ SOLN: 4.0000 mg | Freq: Four times a day (QID) | INTRAMUSCULAR | Status: DC | PRN

## 2014-11-05 NOTE — Telephone Encounter (Signed)
[facility] - Client [place] [facility]  Patient Name: Veronica Phillips  DOB: 12/10/1922    Initial Comment Caller states is having chest pains.    Nurse Assessment  Nurse: Mechele Dawley, RN, Amy Date/Time Eilene Ghazi Time): 11/05/2014 12:44:19 PM  Confirm and document reason for call. If symptomatic, describe symptoms. ---CALLER STATED HER MOTHER STARTED HAVING CHEST PAINS LAST NIGHT. WHEN IT FIRST STARTED SHE THOUGHT IT WAS ACID REFLUX. SHE TOOK HER PAIN PILL DUE TO IT HURTING SO. SHE STATES IT EASED IT. IT IS HURTING IN THE MIDDLE. INTERMITTENTLY. DULL ACHE. IT WAS LAST A GOOD WHILE WHEN ITS THERE. SHE STATES IT EASED UP WITH THE PAIN PILL.  Has the patient traveled out of the country within the last 30 days? ---Not Applicable  Does the patient require triage? ---Yes  Related visit to physician within the last 2 weeks? ---No  Does the PT have any chronic conditions? (i.e. diabetes, asthma, etc.) ---Yes  List chronic conditions. ---REFLUX, RECENTLY OFF BP MEDS,     Guidelines    Guideline Title Affirmed Question Affirmed Notes  Chest Pain Patient sounds very sick or weak to the triager    Final Disposition User   Go to ED Now (or PCP triage) Mechele Dawley, Therapist, sports, Amy

## 2014-11-05 NOTE — H&P (Signed)
History and Physical  Veronica Phillips TGG:[id number] DOB: 1922/09/07 DOA: 11/05/2014   PCP: Joycelyn Man, MD  Referring Physician: ED/ Dr. Noemi Chapel  Chief Complaint: chest pain  HPI:  81 year old female with a history of hypertension, Achalasia, breast cancer, glaucoma, GERD presents with one-day history of chest discomfort that began on the evening of 11/04/2014 around 9 PM. The patient states that she went to bed around 3 AM and took some Tylenol prior to going to bed. She stated that the pain was sharp in nature extending bilaterally across her chest. She denied any shortness of breath, nausea, vomiting, diaphoresis, but she experienced some lightheadedness. She felt that the Tylenol eased her chest discomfort somewhat. When she woke up this morning her chest discomfort was still present but improved. However, the patient came to the emergency department at the insistence of her daughter. Presently she is chest pain-free. She denies any fevers, chills, nausea, vomiting, diarrhea, abdominal pain, dysuria, hematuria, dizziness presently. The patient denies any other over-the-counter medications other than which is listed on her MAR. She was told that she had hypertension in the past, but was told by her primary care physician to stop her antihypertensive medications 2 years ago because she was feeling dizzy and her blood pressure was running "low". She has not followed up with her primary care physician for nearly 2 years.  In the emergency department, the patient was noted to have a blood pressure up to 192/82, but she was afebrile with oxygen saturations 99%. The patient was given topical nitroglycerin with improvement of her blood pressure 173/100. EKG showed sinus rhythm with less than 1 mm ST depression in V5 and V6. Chest x-ray was negative. Assessment/Plan: Atypical chest pain -HEART score =4 -cycle troponins -consult cardiology -continue ASA -echo -telemetry -am lipid  panel Uncontrolled hypertension -Start the patient on amlodipine -Check TSH -am lipid panel Anemia -check iron studies as pt currently taking Fergon -Hemoglobin is stable at baseline of 11 Achalasia -Patient had esophageal dilatation on 11/27/2014 performed by Dr. Fuller Plan GERD -Continue PPI Hypokalemia -Replete -Check magnesium        Past Medical History  Diagnosis Date  . Hypertension   . Arthritis   . Breast cancer     left  . Diverticulosis of colon   . Glaucoma   . Atrophic gastritis   . GERD (gastroesophageal reflux disease)    Past Surgical History  Procedure Laterality Date  . Breast surgery  1989    mastectomy of left breast  . Esophageal manometry N/A 10/27/2012    Procedure: ESOPHAGEAL MANOMETRY (EM);  Surgeon: Ladene Artist, MD;  Location: [facility];  Service: Endoscopy;  Laterality: N/A;  . Esophagogastroduodenoscopy N/A 07/09/2013    Procedure: ESOPHAGOGASTRODUODENOSCOPY (EGD);  Surgeon: Ladene Artist, MD;  Location: [facility];  Service: Endoscopy;  Laterality: N/A;  . Botox injection N/A 07/09/2013    Procedure: BOTOX INJECTION;  Surgeon: Ladene Artist, MD;  Location: [facility];  Service: Endoscopy;  Laterality: N/A;  . Esophagogastroduodenoscopy N/A 10/07/2013    Procedure: ESOPHAGOGASTRODUODENOSCOPY (EGD);  Surgeon: Ladene Artist, MD;  Location: [facility];  Service: Endoscopy;  Laterality: N/A;  wtih botox  . Esophagogastroduodenoscopy (egd) with propofol N/A 06/28/2014    Procedure: ESOPHAGOGASTRODUODENOSCOPY (EGD) WITH PROPOFOL;  Surgeon: Ladene Artist, MD;  Location: [facility];  Service: Endoscopy;  Laterality: N/A;  . Botox injection N/A 06/28/2014    Procedure: BOTOX INJECTION;  Surgeon: Ladene Artist, MD;  Location:  [facility];  Service: Endoscopy;  Laterality: N/A;  . Balloon dilation N/A 06/28/2014    Procedure: BALLOON DILATION;  Surgeon: Ladene Artist, MD;  Location: [facility];  Service: Endoscopy;  Laterality: N/A;    Social History:  reports that she has never smoked. She has never used smokeless tobacco. She reports that she does not drink alcohol or use illicit drugs.   Family History  Problem Relation Age of Onset  . Hypertension      family hx  . Lung cancer      family hx  . Stroke      family hx  . Heart disease      family hx     Allergies  Allergen Reactions  . Septra [Bactrim] Swelling      Prior to Admission medications   Medication Sig Start Date End Date Taking? Authorizing Provider  Cranberry 200 MG CAPS Take 20 mg by mouth daily.    Yes Historical Provider, MD  cyanocobalamin (,VITAMIN B-12,) 1000 MCG/ML injection INJECT 1 ML ( 1,000 MCG TOTAL) INTO THE  MUSCLE EVERY 30 DAYS 04/12/14  Yes Dorena Cookey, MD  ferrous gluconate (FERGON) 325 MG tablet Take 325 mg by mouth daily with breakfast.     Yes Historical Provider, MD  lansoprazole (PREVACID SOLUTAB) 30 MG disintegrating tablet Take 1 tablet (30 mg total) by mouth daily. 06/14/14  Yes Lori P Hvozdovic, PA-C  mirtazapine (REMERON SOLTAB) 15 MG disintegrating tablet Take 1 tablet (15 mg total) by mouth at bedtime. 05/04/14  Yes Dorena Cookey, MD  Multiple Vitamin (MULTIVITAMIN) tablet Take 1 tablet by mouth daily.     Yes Historical Provider, MD  multivitamin-lutein (OCUVITE-LUTEIN) CAPS capsule Take 1 capsule by mouth daily.   Yes Historical Provider, MD  travoprost, benzalkonium, (TRAVATAN) 0.004 % ophthalmic solution Place 1 drop into both eyes at bedtime.     Yes Historical Provider, MD  vitamin E 400 UNIT capsule Take 400 Units by mouth daily.   Yes Historical Provider, MD  lansoprazole (PREVACID) 30 MG capsule 1 capsule before breakfast. Patient not taking: Reported on 11/05/2014 07/05/14   Lori P Hvozdovic, PA-C    Review of Systems:  Constitutional:  No weight loss, night sweats, Fevers, chills, fatigue.  Head&Eyes: No headache.  No vision loss.  No eye pain or scotoma ENT:  No Difficulty  swallowing,Tooth/dental problems,Sore throat,  No ear ache, post nasal drip,  Cardio-vascular:  No Orthopnea, PND, swelling in lower extremities,  dizziness, palpitations  GI:  No  abdominal pain, nausea, vomiting, diarrhea, loss of appetite, hematochezia, melena, heartburn, indigestion, Resp:  No shortness of breath with exertion or at rest. No cough. No coughing up of blood .No wheezing.No chest wall deformity  Skin:  no rash or lesions.  GU:  no dysuria, change in color of urine, no urgency or frequency. No flank pain.  Musculoskeletal:  No joint pain or swelling. No decreased range of motion. No back pain.  Psych:  No change in mood or affect. No depression or anxiety. Neurologic: No headache, no dysesthesia, no focal weakness, no vision loss. No syncope  Physical Exam: Filed Vitals:   11/05/14 1545 11/05/14 1600 11/05/14 1615 11/05/14 1630  BP: 192/84 184/87 162/95 179/88  Pulse: 67 68 72 70  Temp:      Resp: 17 19 17 22   SpO2: 98% 99% 99% 97%   General:  A&O x 3, NAD, nontoxic, pleasant/cooperative Head/Eye: No conjunctival hemorrhage, no icterus, [place]/AT, No nystagmus ENT:  No icterus,  No thrush, edentulous, no pharyngeal exudate Neck:  No masses, no lymphadenpathy, no bruits CV:  RRR, no rub, no gallop, no S3 Lung:  CTAB, good air movement, no wheeze, no rhonchi Abdomen: soft/NT, +BS, nondistended, no peritoneal signs Ext: No cyanosis, No rashes, No petechiae, No lymphangitis, No edema   Labs on Admission:  Basic Metabolic Panel:  Recent Labs Lab 11/05/14 1441  NA 142  K 3.4*  CL 108  CO2 26  GLUCOSE 96  BUN 16  CREATININE 1.17*  CALCIUM 9.1   Liver Function Tests: No results for input(s): AST, ALT, ALKPHOS, BILITOT, PROT, ALBUMIN in the last 168 hours. No results for input(s): LIPASE, AMYLASE in the last 168 hours. No results for input(s): AMMONIA in the last 168 hours. CBC:  Recent Labs Lab 11/05/14 1441  WBC 3.8*  HGB 11.3*  HCT 34.5*  MCV  90.6  PLT 241   Cardiac Enzymes: No results for input(s): CKTOTAL, CKMB, CKMBINDEX, TROPONINI in the last 168 hours. BNP: Invalid input(s): POCBNP CBG: No results for input(s): GLUCAP in the last 168 hours.  Radiological Exams on Admission: Dg Chest 2 View  11/05/2014   CLINICAL DATA:  Generalized chest pain since last pm w/ slight light headedness. No cough or congestion. No fever. No n/v. No headache. No htn or diab, although patient states she has been on htn meds in the past. Non smoker. No hx mi or stroke.  EXAM: CHEST  2 VIEW  COMPARISON:  09/28/2012  FINDINGS: Cardiac silhouette is top-normal in size. Aorta is mildly tortuous. No mediastinal or hilar masses.  Lungs are hyperexpanded but clear.  No pleural effusion or pneumothorax.  There stable changes from a left mastectomy.  Bony thorax is demineralized but grossly intact.  IMPRESSION: No acute cardiopulmonary disease.   Electronically Signed   By: Lajean Manes M.D.   On: 11/05/2014 15:12    EKG: Independently reviewed. Sinus rhythm with <24mm ST depression V5-6    Time spent:55 minutes Code Status:   FULL Family Communication:  No Family at bedside   Reyne Falconi, DO  [facility] Pager [phone]  If 7PM-7AM, please contact night-coverage [email or web address] Password [facility] 11/05/2014, 4:36 PM

## 2014-11-05 NOTE — Telephone Encounter (Signed)
Left message on machine for patient to return our call. Left message on machine for daughter to return our call.

## 2014-11-05 NOTE — ED Notes (Signed)
Per pt sts episode chest pain last night across her chest. sts she took a pain pill and it eased off. Denies pain currently. Denies any other problems or symptoms.

## 2014-11-05 NOTE — ED Provider Notes (Signed)
CSN: [id number]     Arrival date & time 11/05/14  1354 History   First MD Initiated Contact with Patient 11/05/14 1410     Chief Complaint  Patient presents with  . Chest Pain     (Consider location/radiation/quality/duration/timing/severity/associated sxs/prior Treatment) HPI Comments: The patient is an 81 year old female, she has no significant cardiac history but does have a history of breast cancer, hypertension (no longer treated with antihypertensives) and arthritis. She reports that in February she lost a daughter and another daughter had breast cancer surgery, she has been feeling increasingly stressed but has not had any chest discomfort until last night when she developed a tightness or a heaviness that spread across her chest. It has been present most of the evening, it does, and go intermittently, at this time it is very mild but still present. She denies any swelling of the legs, fever, chills, cough, shortness of breath, nausea or diaphoresis. She has not had any medications prior to arrival for the symptoms, she has not been evaluated with cardiac testing in the past.  Patient is a 81 y.o. female presenting with chest pain. The history is provided by the patient.  Chest Pain   Past Medical History  Diagnosis Date  . Hypertension   . Arthritis   . Breast cancer     left  . Diverticulosis of colon   . Glaucoma   . Atrophic gastritis   . GERD (gastroesophageal reflux disease)    Past Surgical History  Procedure Laterality Date  . Breast surgery  1989    mastectomy of left breast  . Esophageal manometry N/A 10/27/2012    Procedure: ESOPHAGEAL MANOMETRY (EM);  Surgeon: Ladene Artist, MD;  Location: [facility];  Service: Endoscopy;  Laterality: N/A;  . Esophagogastroduodenoscopy N/A 07/09/2013    Procedure: ESOPHAGOGASTRODUODENOSCOPY (EGD);  Surgeon: Ladene Artist, MD;  Location: [facility];  Service: Endoscopy;  Laterality: N/A;  . Botox injection N/A 07/09/2013   Procedure: BOTOX INJECTION;  Surgeon: Ladene Artist, MD;  Location: [facility];  Service: Endoscopy;  Laterality: N/A;  . Esophagogastroduodenoscopy N/A 10/07/2013    Procedure: ESOPHAGOGASTRODUODENOSCOPY (EGD);  Surgeon: Ladene Artist, MD;  Location: [facility];  Service: Endoscopy;  Laterality: N/A;  wtih botox  . Esophagogastroduodenoscopy (egd) with propofol N/A 06/28/2014    Procedure: ESOPHAGOGASTRODUODENOSCOPY (EGD) WITH PROPOFOL;  Surgeon: Ladene Artist, MD;  Location: [facility];  Service: Endoscopy;  Laterality: N/A;  . Botox injection N/A 06/28/2014    Procedure: BOTOX INJECTION;  Surgeon: Ladene Artist, MD;  Location: [facility];  Service: Endoscopy;  Laterality: N/A;  . Balloon dilation N/A 06/28/2014    Procedure: BALLOON DILATION;  Surgeon: Ladene Artist, MD;  Location: [facility];  Service: Endoscopy;  Laterality: N/A;   Family History  Problem Relation Age of Onset  . Hypertension      family hx  . Lung cancer      family hx  . Stroke      family hx  . Heart disease      family hx   History  Substance Use Topics  . Smoking status: Never Smoker   . Smokeless tobacco: Never Used  . Alcohol Use: No   OB History    No data available     Review of Systems  Cardiovascular: Positive for chest pain.  All other systems reviewed and are negative.     Allergies  Septra  Home Medications   Prior to Admission medications  Medication Sig Start Date End Date Taking? Authorizing Provider  Cranberry 200 MG CAPS Take 20 mg by mouth daily.    Yes Historical Provider, MD  cyanocobalamin (,VITAMIN B-12,) 1000 MCG/ML injection INJECT 1 ML ( 1,000 MCG TOTAL) INTO THE  MUSCLE EVERY 30 DAYS 04/12/14  Yes Dorena Cookey, MD  ferrous gluconate (FERGON) 325 MG tablet Take 325 mg by mouth daily with breakfast.     Yes Historical Provider, MD  lansoprazole (PREVACID SOLUTAB) 30 MG disintegrating tablet Take 1 tablet (30 mg total) by mouth daily. 06/14/14  Yes Lori P  Hvozdovic, PA-C  mirtazapine (REMERON SOLTAB) 15 MG disintegrating tablet Take 1 tablet (15 mg total) by mouth at bedtime. 05/04/14  Yes Dorena Cookey, MD  Multiple Vitamin (MULTIVITAMIN) tablet Take 1 tablet by mouth daily.     Yes Historical Provider, MD  multivitamin-lutein (OCUVITE-LUTEIN) CAPS capsule Take 1 capsule by mouth daily.   Yes Historical Provider, MD  travoprost, benzalkonium, (TRAVATAN) 0.004 % ophthalmic solution Place 1 drop into both eyes at bedtime.     Yes Historical Provider, MD  vitamin E 400 UNIT capsule Take 400 Units by mouth daily.   Yes Historical Provider, MD  lansoprazole (PREVACID) 30 MG capsule 1 capsule before breakfast. Patient not taking: Reported on 11/05/2014 07/05/14   Lori P Hvozdovic, PA-C   BP 173/100 mmHg  Pulse 70  Temp(Src) 97.6 F (36.4 C)  Resp 15  SpO2 99% Physical Exam  Constitutional: She appears well-developed and well-nourished. No distress.  HENT:  Head: Normocephalic and atraumatic.  Mouth/Throat: Oropharynx is clear and moist. No oropharyngeal exudate.  Eyes: Conjunctivae and EOM are normal. Pupils are equal, round, and reactive to light. Right eye exhibits no discharge. Left eye exhibits no discharge. No scleral icterus.  Neck: Normal range of motion. Neck supple. No JVD present. No thyromegaly present.  Cardiovascular: Normal rate, regular rhythm, normal heart sounds and intact distal pulses.  Exam reveals no gallop and no friction rub.   No murmur heard. Pulmonary/Chest: Effort normal and breath sounds normal. No respiratory distress. She has no wheezes. She has no rales.  Abdominal: Soft. Bowel sounds are normal. She exhibits no distension and no mass. There is no tenderness.  Very thin, Aorta palpated, no obvious masses  Musculoskeletal: Normal range of motion. She exhibits no edema or tenderness.  Lymphadenopathy:    She has no cervical adenopathy.  Neurological: She is alert. Coordination normal.  Skin: Skin is warm and dry.  No rash noted. No erythema.  Psychiatric: She has a normal mood and affect. Her behavior is normal.  Nursing note and vitals reviewed.   ED Course  Procedures (including critical care time) Labs Review Labs Reviewed  CBC - Abnormal; Notable for the following:    WBC 3.8 (*)    RBC 3.81 (*)    Hemoglobin 11.3 (*)    HCT 34.5 (*)    All other components within normal limits  BASIC METABOLIC PANEL - Abnormal; Notable for the following:    Potassium 3.4 (*)    Creatinine, Ser 1.17 (*)    GFR calc non Af Amer 40 (*)    GFR calc Af Amer 46 (*)    All other components within normal limits  I-STAT TROPOININ, ED    Imaging Review Dg Chest 2 View  11/05/2014   CLINICAL DATA:  Generalized chest pain since last pm w/ slight light headedness. No cough or congestion. No fever. No n/v. No headache. No htn or  diab, although patient states she has been on htn meds in the past. Non smoker. No hx mi or stroke.  EXAM: CHEST  2 VIEW  COMPARISON:  09/28/2012  FINDINGS: Cardiac silhouette is top-normal in size. Aorta is mildly tortuous. No mediastinal or hilar masses.  Lungs are hyperexpanded but clear.  No pleural effusion or pneumothorax.  There stable changes from a left mastectomy.  Bony thorax is demineralized but grossly intact.  IMPRESSION: No acute cardiopulmonary disease.   Electronically Signed   By: Lajean Manes M.D.   On: 11/05/2014 15:12     EKG Interpretation   Date/Time:  Friday Nov 05 2014 14:02:43 EDT Ventricular Rate:  79 PR Interval:  164 QRS Duration: 80 QT Interval:  382 QTC Calculation: 438 R Axis:   67 Text Interpretation:  Normal sinus rhythm Cannot rule out Anterior infarct  , age undetermined Abnormal ECG Since last tracing ST abnormalities now  present in lateral precordial leads. Abnormal ekg Confirmed by Sabra Heck  MD,  Alayne Estrella ([id number]) on 11/05/2014 2:12:49 PM      MDM   Final diagnoses:  Chest pain, unspecified chest pain type    The patient has normal heart and  lung sounds, her EKG shows new slight ST abnormalities which were not seen on prior EKGs, her vital signs are unremarkable, she has initial blood pressure of 140/74, her second check was much higher in the 190's, will follow on reevaluation. We'll also evaluate with labs and a chest x-ray. Aspirin, Nitropaste  D/w Dr. Carles Collet who will admit  Ongoing moderate Htn  Meds given in ED:  Medications  aspirin chewable tablet 324 mg (324 mg Oral Given 11/05/14 1513)  nitroGLYCERIN (NITROGLYN) 2 % ointment 0.5 inch (0.5 inches Topical Given 11/05/14 1513)    New Prescriptions   No medications on file      Noemi Chapel, MD 11/05/14 1606

## 2014-11-05 NOTE — Consult Note (Signed)
CARDIOLOGY CONSULT NOTE   Patient ID: Veronica Phillips MRN: [id number], DOB/AGE: Jul 09, 1922   Admit date: 11/05/2014 Date of Consult: 11/05/2014   Primary Physician: Joycelyn Man, MD Primary Cardiologist: new  Pt. Profile  81 year old Caucasian female with past medical history of hypertension, GERD, history of breast cancer s/p surgery in 09/30/87 present with prolonged chest pain while watching TV at night  Problem List  Past Medical History  Diagnosis Date  . Hypertension   . Arthritis   . Breast cancer     left  . Diverticulosis of colon   . Glaucoma   . Atrophic gastritis   . GERD (gastroesophageal reflux disease)     Past Surgical History  Procedure Laterality Date  . Breast surgery  September 30, 1987    mastectomy of left breast  . Esophageal manometry N/A 10/27/2012    Procedure: ESOPHAGEAL MANOMETRY (EM);  Surgeon: Ladene Artist, MD;  Location: [facility];  Service: Endoscopy;  Laterality: N/A;  . Esophagogastroduodenoscopy N/A 07/09/2013    Procedure: ESOPHAGOGASTRODUODENOSCOPY (EGD);  Surgeon: Ladene Artist, MD;  Location: [facility];  Service: Endoscopy;  Laterality: N/A;  . Botox injection N/A 07/09/2013    Procedure: BOTOX INJECTION;  Surgeon: Ladene Artist, MD;  Location: [facility];  Service: Endoscopy;  Laterality: N/A;  . Esophagogastroduodenoscopy N/A 10/07/2013    Procedure: ESOPHAGOGASTRODUODENOSCOPY (EGD);  Surgeon: Ladene Artist, MD;  Location: [facility];  Service: Endoscopy;  Laterality: N/A;  wtih botox  . Esophagogastroduodenoscopy (egd) with propofol N/A 06/28/2014    Procedure: ESOPHAGOGASTRODUODENOSCOPY (EGD) WITH PROPOFOL;  Surgeon: Ladene Artist, MD;  Location: [facility];  Service: Endoscopy;  Laterality: N/A;  . Botox injection N/A 06/28/2014    Procedure: BOTOX INJECTION;  Surgeon: Ladene Artist, MD;  Location: [facility];  Service: Endoscopy;  Laterality: N/A;  . Balloon dilation N/A 06/28/2014    Procedure: BALLOON DILATION;  Surgeon:  Ladene Artist, MD;  Location: [facility];  Service: Endoscopy;  Laterality: N/A;     Allergies  Allergies  Allergen Reactions  . Septra [Bactrim] Swelling    HPI   The patient is a 81 year old  female with past medical history of hypertension, GERD, history of breast cancer s/p surgery in 09/30/1987. According to the patient, she has not had any recurrent breast cancer since that time. She admits to history of chest discomfort in the setting of uncontrolled hypertension in the past however has not noticed any significant chest discomfort prior to yesterday. She was taken off of her blood pressure medication over 2 years ago due to dizziness. She has been living alone since her husband passed away in 2011/09/30. One of her daughter recently passed away in Aug 30, 2022.   She was in her usual state of health until the night of 11/04/2014 when she had gradual onset of sharp bilateral chest discomfort while watching TV. She denies any radiation or associated symptoms such as shortness of breath, dizziness, or nausea. The chest pain lasted for several hours and eventually resolved sometime this morning after several doses of pain medication. Despite prolonged chest pain, on arrival, her initial troponin was negative. Significant left or finding include creatinine of 1.17, potassium 3.4, hemoglobin 11.3. EKG was negative except for downsloping of ST segment in V5 and V6. This was not present during the previous EKG in 09/29/2012. Previous CT of the chest in Sep 29, 2012 was reviewed, did not appreciate significant coronary calcification. Chest x-ray has been negative. Cardiology has been consulted for chest pain.  No current facility-administered medications on file prior to encounter.   Current Outpatient Prescriptions on File Prior to Encounter  Medication Sig Dispense Refill  . Cranberry 200 MG CAPS Take 20 mg by mouth daily.     . cyanocobalamin (,VITAMIN B-12,) 1000 MCG/ML injection INJECT 1 ML ( 1,000 MCG TOTAL) INTO  THE  MUSCLE EVERY 30 DAYS 30 mL 6  . ferrous gluconate (FERGON) 325 MG tablet Take 325 mg by mouth daily with breakfast.      . lansoprazole (PREVACID SOLUTAB) 30 MG disintegrating tablet Take 1 tablet (30 mg total) by mouth daily. 30 tablet 3  . mirtazapine (REMERON SOLTAB) 15 MG disintegrating tablet Take 1 tablet (15 mg total) by mouth at bedtime. 100 tablet 3  . Multiple Vitamin (MULTIVITAMIN) tablet Take 1 tablet by mouth daily.      . multivitamin-lutein (OCUVITE-LUTEIN) CAPS capsule Take 1 capsule by mouth daily.    . travoprost, benzalkonium, (TRAVATAN) 0.004 % ophthalmic solution Place 1 drop into both eyes at bedtime.      . vitamin E 400 UNIT capsule Take 400 Units by mouth daily.    . lansoprazole (PREVACID) 30 MG capsule 1 capsule before breakfast. (Patient not taking: Reported on 11/05/2014) 30 capsule 2    Family History Family History  Problem Relation Age of Onset  . Hypertension      family hx  . Lung cancer      family hx  . Stroke      family hx  . Heart disease      family hx     Social History History   Social History  . Marital Status: Widowed    Spouse Name: N/A  . Number of Children: N/A  . Years of Education: N/A   Occupational History  . Not on file.   Social History Main Topics  . Smoking status: Never Smoker   . Smokeless tobacco: Never Used  . Alcohol Use: No  . Drug Use: No  . Sexual Activity: Not on file   Other Topics Concern  . Not on file   Social History Narrative     Review of Systems  General:  No chills, fever, night sweats or weight changes.  Cardiovascular:  No dyspnea on exertion, edema, orthopnea, palpitations, paroxysmal nocturnal dyspnea. +chest pain Dermatological: No rash, lesions/masses Respiratory: No cough, dyspnea Urologic: No hematuria, dysuria Abdominal:   No nausea, vomiting, diarrhea, bright red blood per rectum, melena, or hematemesis Neurologic:  No visual changes, wkns, changes in mental status. All  other systems reviewed and are otherwise negative except as noted above.  Physical Exam  Blood pressure 179/88, pulse 70, temperature 97.6 F (36.4 C), resp. rate 22, SpO2 97 %.  General: Pleasant, NAD Psych: Normal affect. Neuro: Alert and oriented X 3. Moves all extremities spontaneously. HEENT: Normal  Neck: Supple without bruits or JVD. Lungs:  Resp regular and unlabored, CTA. Heart: RRR no s3, s4, or murmurs. Abdomen: Soft, non-tender, non-distended, BS + x 4.  Extremities: No clubbing, cyanosis or edema. DP/PT/Radials 2+ and equal bilaterally.  Labs  No results for input(s): CKTOTAL, CKMB, TROPONINI in the last 72 hours. Lab Results  Component Value Date   WBC 3.8* 11/05/2014   HGB 11.3* 11/05/2014   HCT 34.5* 11/05/2014   MCV 90.6 11/05/2014   PLT 241 11/05/2014    Recent Labs Lab 11/05/14 1441  NA 142  K 3.4*  CL 108  CO2 26  BUN 16  CREATININE 1.17*  CALCIUM  9.1  GLUCOSE 96   No results found for: CHOL, HDL, LDLCALC, TRIG No results found for: DDIMER  Radiology/Studies  Dg Chest 2 View  11/05/2014   CLINICAL DATA:  Generalized chest pain since last pm w/ slight light headedness. No cough or congestion. No fever. No n/v. No headache. No htn or diab, although patient states she has been on htn meds in the past. Non smoker. No hx mi or stroke.  EXAM: CHEST  2 VIEW  COMPARISON:  09/28/2012  FINDINGS: Cardiac silhouette is top-normal in size. Aorta is mildly tortuous. No mediastinal or hilar masses.  Lungs are hyperexpanded but clear.  No pleural effusion or pneumothorax.  There stable changes from a left mastectomy.  Bony thorax is demineralized but grossly intact.  IMPRESSION: No acute cardiopulmonary disease.   Electronically Signed   By: Lajean Manes M.D.   On: 11/05/2014 15:12    ECG  Normal sinus rhythm with downsloping of V5 and V6 and EKG.  ASSESSMENT AND PLAN  1. Atypical chest pain  - prolonged, negative enzyme  - EKG does shows down slopping of  lateral leads, will review with MD  - will trend enzyme overnight, if negative may consider discharge and followup as outpatient (if has sx as outpatient, will need stress test)  - maybe related to her uncontrolled HTN  2. Uncontrolled HTN  - need better control  - previous antihypertensive med stopped due to dizziness  - IM started on amlodipine  3. COPD 4. H/o breast CA   Signed, Almyra Deforest, PA-C 11/05/2014, 5:07 PM  I have examined the patient and reviewed assessment and plan and discussed with patient.  Agree with above as stated.  Patient with atypical chest pain.  She had a prolonged episode but still her troponin is negative. Prior to coming to the hospital, her most strenuous activity is walking in the grocery store. She has not had chest pain with this activity. She has occasional fatigue. She will sit down for a few minutes and then restart her activity without problems.   Rule out for MI overnight.  If her enzymes remain negative and her chest pain does not return, she likely can be discharged in the morning. If she has recurrent symptoms after discharge, could plan for an outpatient stress test. Otherwise, would just treat her hypertension. Ischemia workup may not be necessary given that her symptoms were atypical and her blood pressure is elevated. She is also quite frail. Conservative management may be reasonable.  Vasilisa Vore S.

## 2014-11-05 NOTE — Telephone Encounter (Signed)
Patient has checked into ED

## 2014-11-05 NOTE — ED Notes (Signed)
Admitting MD at bedside.

## 2014-11-06 ENCOUNTER — Observation Stay (HOSPITAL_COMMUNITY): Payer: Medicare Other

## 2014-11-06 DIAGNOSIS — R079 Chest pain, unspecified: Secondary | ICD-10-CM | POA: Diagnosis not present

## 2014-11-06 DIAGNOSIS — I10 Essential (primary) hypertension: Secondary | ICD-10-CM | POA: Diagnosis not present

## 2014-11-06 DIAGNOSIS — K220 Achalasia of cardia: Secondary | ICD-10-CM | POA: Diagnosis not present

## 2014-11-06 DIAGNOSIS — R072 Precordial pain: Secondary | ICD-10-CM | POA: Diagnosis not present

## 2014-11-06 LAB — TROPONIN I: Troponin I: <0.03 ng/mL (ref <0.031)

## 2014-11-06 LAB — BASIC METABOLIC PANEL
Anion gap: 7 (ref 5–15)
BUN: 16 mg/dL (ref 6–20)
CHLORIDE: 108 mmol/L (ref 101–111)
CO2: 27 mmol/L (ref 22–32)
Calcium: 8.9 mg/dL (ref 8.9–10.3)
Creatinine, Ser: 0.96 mg/dL (ref 0.44–1.00)
GFR calc Af Amer: 59 mL/min — ABNORMAL LOW (ref >60)
GFR calc non Af Amer: 51 mL/min — ABNORMAL LOW (ref >60)
GLUCOSE: 81 mg/dL (ref 65–99)
POTASSIUM: 3.9 mmol/L (ref 3.5–5.1)
Sodium: 142 mmol/L (ref 135–145)

## 2014-11-06 LAB — LIPID PANEL
CHOLESTEROL: 175 mg/dL (ref 0–200)
HDL: 62 mg/dL (ref >40)
LDL Cholesterol: 102 mg/dL — ABNORMAL HIGH (ref 0–99)
Total CHOL/HDL Ratio: 2.8 RATIO
Triglycerides: 55 mg/dL (ref <150)
VLDL: 11 mg/dL (ref 0–40)

## 2014-11-06 MED ORDER — ISOSORBIDE MONONITRATE ER 30 MG PO TB24: 15.0000 mg | ORAL_TABLET | Freq: Every day | ORAL | Status: DC

## 2014-11-06 MED ORDER — BOOST / RESOURCE BREEZE PO LIQD
1.0000 | Freq: Two times a day (BID) | ORAL | Status: DC
  Administered 2014-11-06: 1 via ORAL

## 2014-11-06 MED ORDER — ISOSORBIDE MONONITRATE ER 30 MG PO TB24
15.0000 mg | ORAL_TABLET | Freq: Every day | ORAL | Status: DC
  Administered 2014-11-06: 15 mg via ORAL
  Filled 2014-11-06: qty 1

## 2014-11-06 MED ORDER — AMLODIPINE BESYLATE 5 MG PO TABS: 5.0000 mg | ORAL_TABLET | Freq: Every day | ORAL | Status: DC

## 2014-11-06 NOTE — Progress Notes (Addendum)
Initial Nutrition Assessment DOCUMENTATION CODES: Not applicable  INTERVENTION: Resource Breeze po BID, each supplement provides 250 kcal and 9 grams of protein  Recommend MVI  Snacks per pt request  NUTRITION DIAGNOSIS: Increased Protein Needs related to LBM maintenance in elderly as evidenced by age of 35  GOAL: Pt to meet >/= 90% of their estimated nutrition needs   MONITOR: Oral intake, snack/supp preference, Labs, weight  REASON FOR ASSESSMENT: Malnutrition Screening Tool   ASSESSMENT: 81 year old female- PMHx of hypertension, GERD, breast cancer s/p surgery 1989 presents with prolonged chest pain  Spoke with pt. She stated that she has had a slight decrease in appetite but no changes in her weight. She eats 2 meals a day and 1 ensure clear. Also takes: mvi, Vit E, Vit C, Iron, Iron, and cranberry at home. She denies n/v/d, has some constipation, but this is a chronic issue  She also has some chronic swallowing issues she said. She has had botox injections 3x to help her.  She has to chew slowly and take small bites. She reports no issues with liquids  Nutrition Focused Physical Exam: Pt is thin with little muscle, but this is not new and unrelated to admission  Height: Ht Readings from Last 1 Encounters:  06/28/14 5' 0.25" (1.53 m)   Weight: Wt Readings from Last 1 Encounters:  11/06/14 101 lb 1.6 oz (45.859 kg)   Ideal Body Weight:  46 kg Wt Readings from Last 10 Encounters:  11/06/14 101 lb 1.6 oz (45.859 kg)  06/28/14 100 lb (45.36 kg)  06/14/14 101 lb 9.6 oz (46.085 kg)  05/04/14 102 lb 8 oz (46.494 kg)  03/04/14 98 lb 12.8 oz (44.815 kg)  02/12/14 98 lb (44.453 kg)  10/07/13 112 lb (50.803 kg)  07/07/13 110 lb (49.896 kg)  11/06/12 114 lb (51.71 kg)  10/16/12 113 lb (51.256 kg)  Weight stable past 6 months  BMI:  Body mass index is 19.59 kg/(m^2).  Estimated Nutritional Needs: Kcal:  1400-1600 (30-35 Kcal/kg) Protein:  46-55 g (1-1.2 g/kg) Fluid:   1.4-1.6 liters  Skin:  WDL  Diet Order:  Diet Heart Room service appropriate?: Yes; Fluid consistency:: Thin  EDUCATION NEEDS: No Education needs identified at this time  No intake or output data in the 24 hours ending 11/06/14 1136  Last BM: Unknown  Burtis Junes RD, LDN Nutrition Pager: [phone] 11/06/2014 11:36 AM

## 2014-11-06 NOTE — Progress Notes (Signed)
  Echocardiogram 2D Echocardiogram has been performed.  Veronica Phillips 11/06/2014, 11:56 AM

## 2014-11-06 NOTE — Progress Notes (Signed)
Patient Name: Veronica Phillips Date of Encounter: 11/06/2014     Active Problems:   Glaucoma   Achalasia   Precordial chest pain   Uncontrolled hypertension    SUBJECTIVE  The patient is resting comfortably this morning.  Denies chest pain or shortness of breath.  Telemetry shows normal sinus rhythm.  Cardiac enzymes are negative 3.  CURRENT MEDS . amLODipine  5 mg Oral Daily  . aspirin EC  325 mg Oral Daily  . enoxaparin (LOVENOX) injection  40 mg Subcutaneous Q24H  . feeding supplement (ENSURE ENLIVE)  237 mL Oral BID BM  . ferrous gluconate  325 mg Oral Q breakfast  . latanoprost  1 drop Both Eyes QHS  . mirtazapine  15 mg Oral QHS  . multivitamin  1 tablet Oral Daily  . pantoprazole  40 mg Oral Daily  . sodium chloride  3 mL Intravenous Q12H  . vitamin E  400 Units Oral Daily    OBJECTIVE  Filed Vitals:   11/05/14 1630 11/05/14 1718 11/05/14 2000 11/06/14 0500  BP: 179/88 166/95 158/89 148/72  Pulse: 70 73 68 62  Temp:  98.5 F (36.9 C) 98 F (36.7 C) 98.6 F (37 C)  TempSrc:  Oral Oral Oral  Resp: 22  18 16   Weight:    101 lb 1.6 oz (45.859 kg)  SpO2: 97% 100% 99% 96%   No intake or output data in the 24 hours ending 11/06/14 0951 Filed Weights   11/06/14 0500  Weight: 101 lb 1.6 oz (45.859 kg)    PHYSICAL EXAM  General: Pleasant, NAD. Neuro: Alert and oriented X 3. Moves all extremities spontaneously. Psych: Normal affect. HEENT:  Normal  Neck: Supple without bruits or JVD. Lungs:  Resp regular and unlabored, CTA. Heart: RRR no s3, s4, or murmurs. Abdomen: Soft, non-tender, non-distended, BS + x 4.  Extremities: No clubbing, cyanosis or edema. DP/PT/Radials 2+ and equal bilaterally.  Accessory Clinical Findings  CBC  Recent Labs  11/05/14 1441  WBC 3.8*  HGB 11.3*  HCT 34.5*  MCV 90.6  PLT 725   Basic Metabolic Panel  Recent Labs  11/05/14 1441 11/05/14 1839 11/06/14 0540  NA 142  --  142  K 3.4*  --  3.9  CL 108  --  108    CO2 26  --  27  GLUCOSE 96  --  81  BUN 16  --  16  CREATININE 1.17*  --  0.96  CALCIUM 9.1  --  8.9  MG  --  1.5*  --    Liver Function Tests No results for input(s): AST, ALT, ALKPHOS, BILITOT, PROT, ALBUMIN in the last 72 hours. No results for input(s): LIPASE, AMYLASE in the last 72 hours. Cardiac Enzymes  Recent Labs  11/05/14 1839 11/05/14 2331 11/06/14 0540  TROPONINI <0.03 <0.03 <0.03   BNP Invalid input(s): POCBNP D-Dimer No results for input(s): DDIMER in the last 72 hours. Hemoglobin A1C No results for input(s): HGBA1C in the last 72 hours. Fasting Lipid Panel  Recent Labs  11/06/14 0540  CHOL 175  HDL 62  LDLCALC 102*  TRIG 55  CHOLHDL 2.8   Thyroid Function Tests  Recent Labs  11/05/14 1839  TSH 3.435    TELE  Normal sinus rhythm  ECG  05-Nov-2014 14:02:43 [facility] ROUTINE RECORD Normal sinus rhythm Cannot rule out Anterior infarct , age undetermined Abnormal ECG Since last tracing ST abnormalities now present in lateral precordial leads. Abnormal  ekg Confirmed by MILLER MD, Crowell ([id number]) on 11/05/2014 2:12:49 PM 35mm/s 60mm/mV 100Hz  8.0 SP2 12SL 241 HD CID: [id number] Referred by: Confirmed By: Noemi Chapel MD  Radiology/Studies  Dg Chest 2 View  11/05/2014   CLINICAL DATA:  Generalized chest pain since last pm w/ slight light headedness. No cough or congestion. No fever. No n/v. No headache. No htn or diab, although patient states she has been on htn meds in the past. Non smoker. No hx mi or stroke.  EXAM: CHEST  2 VIEW  COMPARISON:  09/28/2012  FINDINGS: Cardiac silhouette is top-normal in size. Aorta is mildly tortuous. No mediastinal or hilar masses.  Lungs are hyperexpanded but clear.  No pleural effusion or pneumothorax.  There stable changes from a left mastectomy.  Bony thorax is demineralized but grossly intact.  IMPRESSION: No acute cardiopulmonary disease.   Electronically Signed   By: Lajean Manes M.D.   On:  11/05/2014 15:12    ASSESSMENT AND PLAN  1. Atypical chest pain She has ruled out for myocardial infarction.  She's had no further chest pain.  At this point would pursue conservative management.  She is presently on nitroglycerin ointment.  Will switch to isosorbide mononitrate.  No further in-hospital cardiology testing indicated.  Consider outpatient Myoview stress test if she has further problems after discharge   2. Uncontrolled HTN -She is showing a good initial response to amlodipine  3. COPD 4. H/o breast CA  Plan: Okay for discharge today from cardiology standpoint.  Medical follow-up with Dr. Sherren Mocha.  Cardiology follow-up with Dr. Irish Lack.  Signed, Darlin Coco MD

## 2014-11-06 NOTE — Discharge Summary (Addendum)
Physician Discharge Summary  Veronica Phillips XBD:[id number] DOB: [birth year] DOA: 11/05/2014  PCP: Joycelyn Man, MD  Admit date: 11/05/2014 Discharge date: 11/06/2014  Time spent: 35 minutes  Recommendations for Outpatient Follow-up:  1. Follow up on blood pressures, she was found to be hypertensive and started on Norvasc and Imdur during this hospitalization.  2. Please follow-up on transthoracic echocardiogram performed on 11/06/2014. Results are pending at the time of this dictation   Discharge Diagnoses:  Active Problems:   Glaucoma   Achalasia   Precordial chest pain   Uncontrolled hypertension   Discharge Condition: Stable  Diet recommendation: Heart Healthy  Filed Weights   11/06/14 0500  Weight: 45.859 kg (101 lb 1.6 oz)    History of present illness:  81 year old female with a history of hypertension, Achalasia, breast cancer, glaucoma, GERD presents with one-day history of chest discomfort that began on the evening of 11/04/2014 around 9 PM. The patient states that she went to bed around 3 AM and took some Tylenol prior to going to bed. She stated that the pain was sharp in nature extending bilaterally across her chest. She denied any shortness of breath, nausea, vomiting, diaphoresis, but she experienced some lightheadedness. She felt that the Tylenol eased her chest discomfort somewhat. When she woke up this morning her chest discomfort was still present but improved. However, the patient came to the emergency department at the insistence of her daughter. Presently she is chest pain-free. She denies any fevers, chills, nausea, vomiting, diarrhea, abdominal pain, dysuria, hematuria, dizziness presently. The patient denies any other over-the-counter medications other than which is listed on her MAR. She was told that she had hypertension in the past, but was told by her primary care physician to stop her antihypertensive medications 2 years ago because she was feeling dizzy  and her blood pressure was running "low". She has not followed up with her primary care physician for nearly 2 years.  Hospital Course:  Patient is a pleasant 81 year old female with a past medical history of achalasia, breast cancer, reflux disease who presented to the emergency department on 11/05/2014 with complaints of chest pain. She was found to be hypertensive having a blood pressure of 192/82. EKG did not show ischemic changes, troponin was negative. She was placed in overnight observation where troponins were cycled and remained negative 3 sets. She was seen and evaluated by cardiology, having a transthoracic echocardiogram performed on 11/06/2014. Chest pain symptoms resolved. I suspect chest pain could be related to achalasia versus gastroesophageal reflux disease. Cardiology did not recommend further cardiac workup during this hospitalization and stress test could be considered in the outpatient setting. Other issues addressed during this hospitalization include hypertension. As noted above she was found to be hypertensive for which she was started on Norvasc and Imdur. Patient's blood pressures coming down, had a blood pressure 148/72 on day of discharge. Please follow-up on blood pressures. Given resolution of symptoms she was discharged back to her home in stable condition on 11/06/2014.  Procedures:  Transthoracic echocardiogram -Please follow-up on results  Consultations:  Cardiology  Discharge Exam: Filed Vitals:   11/06/14 0500  BP: 148/72  Pulse: 62  Temp: 98.6 F (37 C)  Resp: 16    General: Patient is in no acute distress, awake and alert, denies chest pain or shortness of breath. Cardiovascular: Regular rate and rhythm normal S1-S2 Respiratory: Normal respiratory effort, lungs are clear to auscultation bilaterally Abdomen: Soft nontender nondistended positive bowel sounds  Discharge Instructions  Discharge Instructions    Call MD for:  difficulty breathing,  headache or visual disturbances    Complete by:  As directed      Call MD for:  extreme fatigue    Complete by:  As directed      Call MD for:  hives    Complete by:  As directed      Call MD for:  persistant dizziness or light-headedness    Complete by:  As directed      Call MD for:  persistant nausea and vomiting    Complete by:  As directed      Call MD for:  redness, tenderness, or signs of infection (pain, swelling, redness, odor or green/yellow discharge around incision site)    Complete by:  As directed      Call MD for:  severe uncontrolled pain    Complete by:  As directed      Call MD for:  temperature >100.4    Complete by:  As directed      Diet - low sodium heart healthy    Complete by:  As directed      Increase activity slowly    Complete by:  As directed           Current Discharge Medication List    START taking these medications   Details  amLODipine (NORVASC) 5 MG tablet Take 1 tablet (5 mg total) by mouth daily. Qty: 30 tablet, Refills: 2    isosorbide mononitrate (IMDUR) 30 MG 24 hr tablet Take 0.5 tablets (15 mg total) by mouth daily. Qty: 30 tablet, Refills: 3      CONTINUE these medications which have NOT CHANGED   Details  Cranberry 200 MG CAPS Take 20 mg by mouth daily.     cyanocobalamin (,VITAMIN B-12,) 1000 MCG/ML injection INJECT 1 ML ( 1,000 MCG TOTAL) INTO THE  MUSCLE EVERY 30 DAYS Qty: 30 mL, Refills: 6    ferrous gluconate (FERGON) 325 MG tablet Take 325 mg by mouth daily with breakfast.      lansoprazole (PREVACID SOLUTAB) 30 MG disintegrating tablet Take 1 tablet (30 mg total) by mouth daily. Qty: 30 tablet, Refills: 3    mirtazapine (REMERON SOLTAB) 15 MG disintegrating tablet Take 1 tablet (15 mg total) by mouth at bedtime. Qty: 100 tablet, Refills: 3   Associated Diagnoses: Achalasia    Multiple Vitamin (MULTIVITAMIN) tablet Take 1 tablet by mouth daily.      multivitamin-lutein (OCUVITE-LUTEIN) CAPS capsule Take 1 capsule by  mouth daily.    travoprost, benzalkonium, (TRAVATAN) 0.004 % ophthalmic solution Place 1 drop into both eyes at bedtime.      vitamin E 400 UNIT capsule Take 400 Units by mouth daily.      STOP taking these medications     lansoprazole (PREVACID) 30 MG capsule        Allergies  Allergen Reactions  . Septra [Bactrim] Swelling   Follow-up Information    Follow up with TODD,JEFFREY ALLEN, MD In 2 weeks.   Specialty:  Family Medicine   Contact information:   [place] [address] [phone]        The results of significant diagnostics from this hospitalization (including imaging, microbiology, ancillary and laboratory) are listed below for reference.    Significant Diagnostic Studies: Dg Chest 2 View  11/05/2014   CLINICAL DATA:  Generalized chest pain since last pm w/ slight light headedness. No cough or congestion. No fever. No  n/v. No headache. No htn or diab, although patient states she has been on htn meds in the past. Non smoker. No hx mi or stroke.  EXAM: CHEST  2 VIEW  COMPARISON:  09/28/2012  FINDINGS: Cardiac silhouette is top-normal in size. Aorta is mildly tortuous. No mediastinal or hilar masses.  Lungs are hyperexpanded but clear.  No pleural effusion or pneumothorax.  There stable changes from a left mastectomy.  Bony thorax is demineralized but grossly intact.  IMPRESSION: No acute cardiopulmonary disease.   Electronically Signed   By: Lajean Manes M.D.   On: 11/05/2014 15:12    Microbiology: No results found for this or any previous visit (from the past 240 hour(s)).   Labs: Basic Metabolic Panel:  Recent Labs Lab 11/05/14 1441 11/05/14 1839 11/06/14 0540  NA 142  --  142  K 3.4*  --  3.9  CL 108  --  108  CO2 26  --  27  GLUCOSE 96  --  81  BUN 16  --  16  CREATININE 1.17*  --  0.96  CALCIUM 9.1  --  8.9  MG  --  1.5*  --    Liver Function Tests: No results for input(s): AST, ALT, ALKPHOS, BILITOT, PROT, ALBUMIN in the  last 168 hours. No results for input(s): LIPASE, AMYLASE in the last 168 hours. No results for input(s): AMMONIA in the last 168 hours. CBC:  Recent Labs Lab 11/05/14 1441  WBC 3.8*  HGB 11.3*  HCT 34.5*  MCV 90.6  PLT 241   Cardiac Enzymes:  Recent Labs Lab 11/05/14 1839 11/05/14 2331 11/06/14 0540  TROPONINI <0.03 <0.03 <0.03   BNP: BNP (last 3 results) No results for input(s): BNP in the last 8760 hours.  ProBNP (last 3 results) No results for input(s): PROBNP in the last 8760 hours.  CBG: No results for input(s): GLUCAP in the last 168 hours.     SignedKelvin Cellar  [facility] 11/06/2014, 2:21 PM

## 2014-11-11 ENCOUNTER — Ambulatory Visit (INDEPENDENT_AMBULATORY_CARE_PROVIDER_SITE_OTHER): Payer: Medicare Other | Admitting: Family Medicine

## 2014-11-11 ENCOUNTER — Encounter: Payer: Self-pay | Admitting: Family Medicine

## 2014-11-11 VITALS — BP 110/70 | HR 70 | Temp 98.0°F | Wt 102.0 lb

## 2014-11-11 DIAGNOSIS — R1310 Dysphagia, unspecified: Secondary | ICD-10-CM

## 2014-11-11 NOTE — Progress Notes (Signed)
Pre visit review using our clinic review tool, if applicable. No additional management support is needed unless otherwise documented below in the visit note. 

## 2014-11-11 NOTE — Patient Instructions (Signed)
Return in June as outlined to get established with Beaulah Dinning our new adult nurse practitioner  Rachel's extension is [phone]

## 2014-11-11 NOTE — Progress Notes (Signed)
   Subjective:    Patient ID: KRUTI HORACEK, female    DOB: [birth year], 81 y.o.   MRN: [id number]  HPI Stuti is an 81 year old widowed female nonsmoker who comes in today company by her daughter. Jennafer still lives alone.  Sokhna's husband died last year and then the daughter died suddenly at home and then the dog died. She became upset and about 10:00 in the evening felt pressure in her chest. She did not having difficulty swallowing. She went the emergency room and was admitted for observation. Cardiac studies were normal including an EF of 75%. She was sent home on Imdur and Norvasc however she took 1 dose of Norvasc and dropped her pressure to the floor and therefore is not taken any more Norvasc.   Review of Systems Review of systems otherwise negative    Objective:   Physical Exam  Well-developed well-nourished female no acute distress vital signs stable she's afebrile cardiopulmonary exam normal BP 110/70 pulse 70 and regular      Assessment & Plan:  I think the event she had was stress-related. She obviously does not need any antihypertensive medication

## 2014-12-07 ENCOUNTER — Encounter: Payer: Self-pay | Admitting: Adult Health

## 2014-12-07 ENCOUNTER — Ambulatory Visit (INDEPENDENT_AMBULATORY_CARE_PROVIDER_SITE_OTHER): Payer: Medicare Other | Admitting: Adult Health

## 2014-12-07 VITALS — BP 112/70 | Temp 98.2°F | Ht 60.25 in | Wt 100.8 lb

## 2014-12-07 DIAGNOSIS — Z7689 Persons encountering health services in other specified circumstances: Secondary | ICD-10-CM

## 2014-12-07 DIAGNOSIS — L600 Ingrowing nail: Secondary | ICD-10-CM | POA: Diagnosis not present

## 2014-12-07 DIAGNOSIS — D518 Other vitamin B12 deficiency anemias: Secondary | ICD-10-CM

## 2014-12-07 DIAGNOSIS — Z7189 Other specified counseling: Secondary | ICD-10-CM | POA: Diagnosis not present

## 2014-12-07 DIAGNOSIS — B351 Tinea unguium: Secondary | ICD-10-CM | POA: Diagnosis not present

## 2014-12-07 NOTE — Progress Notes (Signed)
HPI:  Veronica Phillips is here to establish care. She is a pleasant 29 years young African-American female who presents with her daughter Last PCP and physical:" Has been a few years" Likely 4 years ago.   Has the following chronic problems that require follow up and concerns today:  Thick toenails.  -Patient states that she is unable to trim her toenails anymore because they are curling, they are thick and her toe nail clippers will not cut through her nails anymore. She is also concerned that she has an ingrown toe nail on her left great toe.   Vitamin B12 deficiency Has not been taking vitamin B 12 injections for the last month, she quit taking them because of the pain. She is trying to supplement with the sublingual B12.  Takes vitamin B12 due her history of pernicious anemia.    ROS negative for unless reported above: fevers, chills,feeling poorly, unintentional weight loss, hearing or vision loss, chest pain, palpitations, leg claudication, struggling to breath,Not feeling congested in the chest, no orthopenia, no cough,no wheezing, normal appetite, no soft tissue swelling, no hemoptysis, melena, hematochezia, hematuria, falls, loc, si, or thoughts of self harm.  Immunizations: Unknown if had Prevanar 13.  Diet:Does not follow a diet Exercise:Does not exercise Colonoscopy:Has not had one in ten years.  Dexa:N/A Pap Smear:N/A Mammogram:Nothing since having breast cancer  Past Medical History  Diagnosis Date  . Hypertension   . Arthritis   . Breast cancer     left  . Diverticulosis of colon   . Glaucoma   . Atrophic gastritis   . GERD (gastroesophageal reflux disease)   . Vitamin B 12 deficiency     Past Surgical History  Procedure Laterality Date  . Breast surgery  1989    mastectomy of left breast  . Esophageal manometry N/A 10/27/2012    Procedure: ESOPHAGEAL MANOMETRY (EM);  Surgeon: Ladene Artist, MD;  Location: [facility];  Service: Endoscopy;  Laterality: N/A;   . Esophagogastroduodenoscopy N/A 07/09/2013    Procedure: ESOPHAGOGASTRODUODENOSCOPY (EGD);  Surgeon: Ladene Artist, MD;  Location: [facility];  Service: Endoscopy;  Laterality: N/A;  . Botox injection N/A 07/09/2013    Procedure: BOTOX INJECTION;  Surgeon: Ladene Artist, MD;  Location: [facility];  Service: Endoscopy;  Laterality: N/A;  . Esophagogastroduodenoscopy N/A 10/07/2013    Procedure: ESOPHAGOGASTRODUODENOSCOPY (EGD);  Surgeon: Ladene Artist, MD;  Location: [facility];  Service: Endoscopy;  Laterality: N/A;  wtih botox  . Esophagogastroduodenoscopy (egd) with propofol N/A 06/28/2014    Procedure: ESOPHAGOGASTRODUODENOSCOPY (EGD) WITH PROPOFOL;  Surgeon: Ladene Artist, MD;  Location: [facility];  Service: Endoscopy;  Laterality: N/A;  . Botox injection N/A 06/28/2014    Procedure: BOTOX INJECTION;  Surgeon: Ladene Artist, MD;  Location: [facility];  Service: Endoscopy;  Laterality: N/A;  . Balloon dilation N/A 06/28/2014    Procedure: BALLOON DILATION;  Surgeon: Ladene Artist, MD;  Location: [facility];  Service: Endoscopy;  Laterality: N/A;    Family History  Problem Relation Age of Onset  . Hypertension Mother     family hx  . Lung cancer Father     family hx  . Stroke      family hx  . Heart disease Sister     family hx    History   Social History  . Marital Status: Widowed    Spouse Name: N/A  . Number of Children: N/A  . Years of Education: N/A   Social  History Main Topics  . Smoking status: Never Smoker   . Smokeless tobacco: Never Used  . Alcohol Use: No  . Drug Use: No  . Sexual Activity: Not on file   Other Topics Concern  . None   Social History Narrative     Current outpatient prescriptions:  .  Cranberry 200 MG CAPS, Take 20 mg by mouth daily. , Disp: , Rfl:  .  Cyanocobalamin 2500 MCG SUBL, Place under the tongue., Disp: , Rfl:  .  lansoprazole (PREVACID SOLUTAB) 30 MG disintegrating tablet, Take 1 tablet (30 mg total) by mouth  daily., Disp: 30 tablet, Rfl: 3 .  Multiple Vitamin (MULTIVITAMIN) tablet, Take 1 tablet by mouth daily.  , Disp: , Rfl:  .  multivitamin-lutein (OCUVITE-LUTEIN) CAPS capsule, Take 1 capsule by mouth daily., Disp: , Rfl:  .  vitamin E 400 UNIT capsule, Take 400 Units by mouth daily., Disp: , Rfl:  .  amLODipine (NORVASC) 5 MG tablet, Take 1 tablet (5 mg total) by mouth daily. (Patient not taking: Reported on 11/11/2014), Disp: 30 tablet, Rfl: 2 .  ferrous gluconate (FERGON) 325 MG tablet, Take 325 mg by mouth daily with breakfast.  , Disp: , Rfl:  .  isosorbide mononitrate (IMDUR) 30 MG 24 hr tablet, Take 0.5 tablets (15 mg total) by mouth daily. (Patient not taking: Reported on 11/11/2014), Disp: 30 tablet, Rfl: 3 .  latanoprost (XALATAN) 0.005 % ophthalmic solution, , Disp: , Rfl: 0 .  mirtazapine (REMERON SOLTAB) 15 MG disintegrating tablet, Take 1 tablet (15 mg total) by mouth at bedtime. (Patient not taking: Reported on 12/07/2014), Disp: 100 tablet, Rfl: 3  EXAM:  Filed Vitals:   12/07/14 1404  BP: 112/70  Temp: 98.2 F (36.8 C)    Body mass index is 19.53 kg/(m^2).  GENERAL: vitals reviewed and listed above, alert, oriented, appears well hydrated and in no acute distress. She speaks softly. She has a thin body structure.  HEENT: atraumatic, conjunttiva clear, no obvious abnormalities on inspection of external nose and ears.  NECK: Neck is soft and supple without masses, no adenopathy or thyromegaly, trachea midline, no JVD. Normal range of motion.   LUNGS: clear to auscultation bilaterally, no wheezes, rales or rhonchi, good air movement  CV: Regular rate and rhythm, normal S1/S2, no audible murmurs, gallops, or rubs. No carotid bruit and no peripheral edema.   MS: moves all extremities without noticeable abnormality. No edema noted  Abd: soft/nontender/nondistended/normal bowel sounds   Skin: warm and dry, no rash   Extremities: No clubbing, cyanosis, or edema. Capillary  refill is WNL. Pulses intact bilaterally in upper and lower extremities.   Neuro: CN II-XII intact, sensation and reflexes normal throughout, 5/5 muscle strength in bilateral upper and lower extremities. Normal finger to nose. Normal rapid alternating movements.   PSYCH: pleasant and cooperative, no obvious depression or anxiety  ASSESSMENT AND PLAN:  Discussed the following assessment and plan:  1. Encounter to establish care -Follow up with me in one month for complete physical -Follow up sooner if needed.  -Take Remeron as prescribed, for appetite and insomnia   2. IGTN (ingrowing toe nail) - Ambulatory referral to Podiatry  3. ANEMIA, B12 DEFICIENCY -We'll check vitamin B12 level during next visit to make sure that she is receiving proper supplementation with sublingual B12.  4. Onychomycosis - Ambulatory referral to Podiatry   No diagnosis found. -We reviewed the PMH, PSH, FH, SH, Meds and Allergies. -We provided refills for any medications we  will prescribe as needed. -We addressed current concerns per orders and patient instructions. -We have asked for records for pertinent exams, studies, vaccines and notes from previous providers. -We have advised patient to follow up per instructions below.   -Patient advised to return or notify a provider immediately if symptoms worsen or persist or new concerns arise.  There are no Patient Instructions on file for this visit.   [employer]

## 2014-12-07 NOTE — Patient Instructions (Signed)
Someone will call you to make an appointment for podiatry.   Please take your Remeron every night.   Follow up with me in one month for a complete physical.

## 2014-12-22 ENCOUNTER — Encounter: Payer: Self-pay | Admitting: Podiatry

## 2014-12-22 ENCOUNTER — Ambulatory Visit (INDEPENDENT_AMBULATORY_CARE_PROVIDER_SITE_OTHER): Payer: Medicare Other | Admitting: Podiatry

## 2014-12-22 DIAGNOSIS — M79676 Pain in unspecified toe(s): Secondary | ICD-10-CM | POA: Diagnosis not present

## 2014-12-22 DIAGNOSIS — B351 Tinea unguium: Secondary | ICD-10-CM

## 2014-12-22 NOTE — Progress Notes (Signed)
   Subjective:    Patient ID: Veronica Phillips, female    DOB: 09-Sep-1922, 81 y.o.   MRN: [id number]  HPI  N- SORE L-LT FOOT GREAT TOENAIL D-1 YEAR O-SLOWLY C-WORSE A-PRESSURE T-TRIPLE OINTMENT AND SOAK WITH EPSON SALT.  PT REQUESTING FOR TOENAILS DEBRIDEMENT.  Review of Systems  Constitutional: Positive for fatigue and unexpected weight change.  HENT: Positive for sinus pressure.   Eyes: Positive for itching.  Musculoskeletal: Positive for back pain and gait problem.  Hematological: Bruises/bleeds easily.  All other systems reviewed and are negative.      Objective:   Physical Exam  Patient's daughter present in treatment room today Patient appears to be able to answer questions with some help from her daughter  Vascular: DP and PT pulses 2/4 bilateral Capillary reflex immediate bilaterally  Neurological: Ankle reflex equal and reactive bilaterally Vibratory sensation intact bilaterally Sensation to 10 g monofilament wire intact 5/5 bilaterally  Dermatological: No skin lesions noted bilaterally The toenails are extremely incurvated, elongated, brittle 6-10 The medial border left hallux toenail has some fibrous granular tissue without any active drainage  Musculoskeletal: HAV deformities bilaterally Hammertoe deformity second bilaterally There is no restriction ankle, subtalar, midtarsal joints bilaterally        Assessment & Plan:   Assessment: Satisfactory neurovascular status Symptomatic onychomycoses 6-10 Onychia left hallux  Plan: Review the results of patient and daughter today and recommended debridement All 10 toenails are debrided without any bleeding. The medial border of the left hallux toenail has a granular base from the incurvated deformed nail. At this time because the infection is very localized I made patient daughter where the thought the debridement and application of topical antibiotic ointment was adequate. If she had any sudden  increase in pain, swelling, redness localized left hallux contact office for prescription of oral antibiotics.  Reappoint at patient's request

## 2014-12-22 NOTE — Patient Instructions (Signed)
Apply topical anti-biotic ointment as Triple Antibiotic ointment daily to the left great toenail, cover with a Band-Aid until healed

## 2015-01-06 ENCOUNTER — Ambulatory Visit (INDEPENDENT_AMBULATORY_CARE_PROVIDER_SITE_OTHER): Payer: Medicare Other | Admitting: Adult Health

## 2015-01-06 ENCOUNTER — Encounter: Payer: Self-pay | Admitting: Adult Health

## 2015-01-06 VITALS — Temp 98.3°F | Ht 60.5 in | Wt 105.3 lb

## 2015-01-06 DIAGNOSIS — D518 Other vitamin B12 deficiency anemias: Secondary | ICD-10-CM

## 2015-01-06 DIAGNOSIS — Z23 Encounter for immunization: Secondary | ICD-10-CM | POA: Diagnosis not present

## 2015-01-06 DIAGNOSIS — Z0000 Encounter for general adult medical examination without abnormal findings: Secondary | ICD-10-CM | POA: Diagnosis not present

## 2015-01-06 DIAGNOSIS — I10 Essential (primary) hypertension: Secondary | ICD-10-CM | POA: Diagnosis not present

## 2015-01-06 LAB — CBC WITH DIFFERENTIAL/PLATELET
BASOS PCT: 0.2 % (ref 0.0–3.0)
Basophils Absolute: 0 10*3/uL (ref 0.0–0.1)
EOS PCT: 0 % (ref 0.0–5.0)
Eosinophils Absolute: 0 10*3/uL (ref 0.0–0.7)
HCT: 32.8 % — ABNORMAL LOW (ref 36.0–46.0)
HEMOGLOBIN: 10.8 g/dL — AB (ref 12.0–15.0)
LYMPHS ABS: 1.6 10*3/uL (ref 0.7–4.0)
Lymphocytes Relative: 30.7 % (ref 12.0–46.0)
MCHC: 32.8 g/dL (ref 30.0–36.0)
MCV: 92.1 fl (ref 78.0–100.0)
MONOS PCT: 10.6 % (ref 3.0–12.0)
Monocytes Absolute: 0.6 10*3/uL (ref 0.1–1.0)
NEUTROS ABS: 3.1 10*3/uL (ref 1.4–7.7)
NEUTROS PCT: 58.5 % (ref 43.0–77.0)
Platelets: 236 10*3/uL (ref 150.0–400.0)
RBC: 3.56 Mil/uL — ABNORMAL LOW (ref 3.87–5.11)
RDW: 15.6 % — ABNORMAL HIGH (ref 11.5–15.5)
WBC: 5.3 10*3/uL (ref 4.0–10.5)

## 2015-01-06 LAB — POCT URINALYSIS DIPSTICK
Bilirubin, UA: NEGATIVE
Glucose, UA: NEGATIVE
Nitrite, UA: POSITIVE
PH UA: 5
Spec Grav, UA: 1.02
UROBILINOGEN UA: 0.2

## 2015-01-06 LAB — HEPATIC FUNCTION PANEL
ALBUMIN: 3.6 g/dL (ref 3.5–5.2)
ALT: 15 U/L (ref 0–35)
AST: 18 U/L (ref 0–37)
Alkaline Phosphatase: 50 U/L (ref 39–117)
BILIRUBIN DIRECT: 0.1 mg/dL (ref 0.0–0.3)
Total Bilirubin: 0.7 mg/dL (ref 0.2–1.2)
Total Protein: 6.6 g/dL (ref 6.0–8.3)

## 2015-01-06 LAB — FOLATE: Folate: >24.8 ng/mL (ref >5.9)

## 2015-01-06 LAB — BASIC METABOLIC PANEL
BUN: 24 mg/dL — ABNORMAL HIGH (ref 6–23)
CALCIUM: 9.2 mg/dL (ref 8.4–10.5)
CHLORIDE: 109 meq/L (ref 96–112)
CO2: 31 mEq/L (ref 19–32)
Creatinine, Ser: 1.12 mg/dL (ref 0.40–1.20)
GFR: 58.82 mL/min — ABNORMAL LOW (ref >60.00)
Glucose, Bld: 96 mg/dL (ref 70–99)
Potassium: 4.4 mEq/L (ref 3.5–5.1)
SODIUM: 146 meq/L — AB (ref 135–145)

## 2015-01-06 LAB — VITAMIN B12: Vitamin B-12: 1411 pg/mL — ABNORMAL HIGH (ref 211–911)

## 2015-01-06 NOTE — Progress Notes (Signed)
Pre visit review using our clinic review tool, if applicable. No additional management support is needed unless otherwise documented below in the visit note. 

## 2015-01-06 NOTE — Progress Notes (Signed)
Veronica Phillips, AGNP  Subjective:  Patient presents today for their annual wellness visit. She  has a past medical history of Hypertension; Arthritis; Breast cancer (1989); Diverticulosis of colon; Glaucoma; Atrophic gastritis; GERD (gastroesophageal reflux disease); Vitamin B 12 deficiency; Pernicious anemia; and Lactose intolerance. She lives at home by herself. One daughter lives close.   She has been in the hospital once this year. This was in May 2016 for chest pain and elevated blood pressure. She was admitted and observed over night  She has gained five pounds since last visit after starting Remeron.   Has no complaints during exam today  She follows up with her eye doctor. Has not had a Bone Density ( will inform us if she would like one) and no longer has mammograms.   She has stopped taking her blood pressure medications because it was making her dizzy and her blood pressures were low.   Does not have a health care power of attorney or living will. Has no guns in the home  Preventive Screening-Counseling & Management  Smoking Status: Never Smoker Second Hand Smoking status: No smokers in home  Risk Factors Regular exercise: Does not exercise Diet: Does not follow diet.  Fall Risk: None   Cardiac risk factors:  advanced age (older than 30 for men, 65 for women) Yes Hyperlipidemia No No diabetes. No Family History: Yes  Depression Screen None. PHQ2 0   Activities of Daily Living Independent ADLs and IADLs - She does not drive anymore  Hearing Difficulties: patient declines  Cognitive Testing No reported trouble.   Normal 3 word recall  List the Names of Other Physician/Practitioners you currently use: 1.Dr. Fuller Plan - [place]ey 2. Optho - [place] 01/05/2015  Immunization History  Administered Date(s) Administered  . Influenza Split 03/12/2011, 03/18/2012  . Influenza Whole 06/25/2005, 05/12/2007, 03/18/2009, 03/14/2010  . Influenza,inj,Quad  PF,36+ Mos 03/12/2013, 03/04/2014  . Pneumococcal Polysaccharide-23 06/26/2003  . Td 06/25/1997, 05/11/2008   Required Immunizations needed today Prevnar 13- Updated  Screening tests- up to date Health Maintenance Due  Topic Date Due  . COLONOSCOPY  05/23/1975  . ZOSTAVAX  05/22/1985  . DEXA SCAN  05/22/1990  . PNA vac Low Risk Adult (2 of 2 - PCV13) 06/25/2004  . MAMMOGRAM  03/12/2014    ROS- No pertinent positives discovered in course of AWV  The following were reviewed and entered/updated in epic: Past Medical History  Diagnosis Date  . Hypertension   . Arthritis   . Breast cancer 1989    left  . Diverticulosis of colon   . Glaucoma   . Atrophic gastritis   . GERD (gastroesophageal reflux disease)   . Vitamin B 12 deficiency   . Pernicious anemia   . Lactose intolerance    Patient Active Problem List   Diagnosis Date Noted  . Precordial chest pain 11/05/2014  . Dysphagia, pharyngoesophageal phase   . 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 Achalasia 07/07/2013  . Peripheral edema 05/19/2012  . Glaucoma 07/17/2010  . GERD 07/17/2010  . OTHER DYSPHAGIA 07/17/2010  . PROBLEMS WITH SWALLOWING AND MASTICATION 07/11/2010  . IDIOPATHIC URTICARIA 04/11/2010  . ANEMIA, B12 DEFICIENCY 05/18/2008  . OSTEOARTHRITIS 05/12/2007  . DIVERTICULOSIS, COLON 02/26/2007   Past Surgical History  Procedure Laterality Date  . Breast surgery  1989    mastectomy of left breast  . Esophageal manometry N/A 10/27/2012    Procedure: ESOPHAGEAL MANOMETRY (EM);  Surgeon: Ladene Artist, MD;  Location: [facility];  Service:  Endoscopy;  Laterality: N/A;  . Esophagogastroduodenoscopy N/A 07/09/2013    Procedure: ESOPHAGOGASTRODUODENOSCOPY (EGD);  Surgeon: Ladene Artist, MD;  Location: [facility];  Service: Endoscopy;  Laterality: N/A;  . Botox injection N/A 07/09/2013    Procedure: BOTOX INJECTION;  Surgeon: Ladene Artist, MD;  Location: [facility];  Service: Endoscopy;  Laterality:  N/A;  . Esophagogastroduodenoscopy N/A 10/07/2013    Procedure: ESOPHAGOGASTRODUODENOSCOPY (EGD);  Surgeon: Ladene Artist, MD;  Location: [facility];  Service: Endoscopy;  Laterality: N/A;  wtih botox  . Esophagogastroduodenoscopy (egd) with propofol N/A 06/28/2014    Procedure: ESOPHAGOGASTRODUODENOSCOPY (EGD) WITH PROPOFOL;  Surgeon: Ladene Artist, MD;  Location: [facility];  Service: Endoscopy;  Laterality: N/A;  . Botox injection N/A 06/28/2014    Procedure: BOTOX INJECTION;  Surgeon: Ladene Artist, MD;  Location: [facility];  Service: Endoscopy;  Laterality: N/A;  . Balloon dilation N/A 06/28/2014    Procedure: BALLOON DILATION;  Surgeon: Ladene Artist, MD;  Location: [facility];  Service: Endoscopy;  Laterality: N/A;    Family History  Problem Relation Age of Onset  . Hypertension Mother     family hx  . Lung cancer Father     family hx  . Stroke      family hx  . Heart disease Sister     family hx    Medications- reviewed and updated Current Outpatient Prescriptions  Medication Sig Dispense Refill  . Cranberry 200 MG CAPS Take 20 mg by mouth daily.     . Cyanocobalamin 2500 MCG SUBL Place under the tongue.    . ferrous gluconate (FERGON) 325 MG tablet Take 325 mg by mouth daily with breakfast.      . lansoprazole (PREVACID SOLUTAB) 30 MG disintegrating tablet Take 1 tablet (30 mg total) by mouth daily. 30 tablet 3  . latanoprost (XALATAN) 0.005 % ophthalmic solution   0  . mirtazapine (REMERON SOLTAB) 15 MG disintegrating tablet Take 1 tablet (15 mg total) by mouth at bedtime. 100 tablet 3  . Multiple Vitamin (MULTIVITAMIN) tablet Take 1 tablet by mouth daily.      . multivitamin-lutein (OCUVITE-LUTEIN) CAPS capsule Take 1 capsule by mouth daily.    . vitamin E 400 UNIT capsule Take 400 Units by mouth daily.    Marland Kitchen amLODipine (NORVASC) 5 MG tablet Take 1 tablet (5 mg total) by mouth daily. (Patient not taking: Reported on 01/06/2015) 30 tablet 2  . isosorbide  mononitrate (IMDUR) 30 MG 24 hr tablet Take 0.5 tablets (15 mg total) by mouth daily. (Patient not taking: Reported on 01/06/2015) 30 tablet 3   No current facility-administered medications for this visit.    Allergies-reviewed and updated Allergies  Allergen Reactions  . Septra [Bactrim] Swelling    History   Social History  . Marital Status: Widowed    Spouse Name: N/A  . Number of Children: N/A  . Years of Education: N/A   Social History Main Topics  . Smoking status: Never Smoker   . Smokeless tobacco: Never Used  . Alcohol Use: No  . Drug Use: No  . Sexual Activity: Not on file   Other Topics Concern  . 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 Oropharynx  normal Neck: no thyromegaly CV: RRR no murmurs rubs or gallops Lungs: CTAB no crackles, wheeze, rhonchi Abdomen: soft/nontender/nondistended/normal bowel sounds. No rebound or guarding.  Ext: no edema Skin: warm, dry Neuro: grossly normal, moves all extremities, PERRLA  Assessment/Plan:  1. Medicare annual wellness visit, subsequent - Follow up in six months  - Follow up as needed - Continue to eat high calorie protein rich foods.   2. ANEMIA, B12 DEFICIENCY - Vitamin B12 - Folate - CBC with Differential/Platelet - Oral supplementation. Will follow up and possible increase PO B12 3. Essential hypertension - Continue to monitor BP at home. Restart medications if needed - Hepatic function panel - POCT urinalysis dipstick - CBC with Differential/Platelet - Basic metabolic panel  4. Need for prophylactic vaccination against Streptococcus pneumoniae (pneumococcus)  No problem-specific assessment & plan notes found for this encounter.  Return precautions advised.       These are the  goals we discussed: Goals    . Gain weight     High calorie, high protein foods       This is a list of the screening recommended for you and due dates:  Health Maintenance  Topic Date Due  . Colon Cancer Screening  05/23/1975  . Shingles Vaccine  05/22/1985  . DEXA scan (bone density measurement)  05/22/1990  . Pneumonia vaccines (2 of 2 - PCV13) 06/25/2004  . Mammogram  03/12/2014  . Flu Shot  01/24/2015  . Tetanus Vaccine  05/11/2018

## 2015-01-06 NOTE — Patient Instructions (Signed)
  Veronica Phillips , Thank you for taking time to come for your Medicare Wellness Visit. I appreciate your ongoing commitment to your health goals. Please review the following plan we discussed and let me know if I can assist you in the future.   These are the goals we discussed: Goals    . Gain weight     High calorie, high protein foods       This is a list of the screening recommended for you and due dates:  Health Maintenance  Topic Date Due  . Colon Cancer Screening  05/23/1975  . Shingles Vaccine  05/22/1985  . DEXA scan (bone density measurement)  05/22/1990  . Pneumonia vaccines (2 of 2 - PCV13) 06/25/2004  . Mammogram  03/12/2014  . Flu Shot  01/24/2015  . Tetanus Vaccine  05/11/2018

## 2015-01-13 ENCOUNTER — Telehealth: Payer: Self-pay

## 2015-01-13 NOTE — Telephone Encounter (Signed)
Called and spoke with pt's daughter [name] about lab results.  She is aware.

## 2015-03-11 ENCOUNTER — Other Ambulatory Visit: Payer: Self-pay | Admitting: Family Medicine

## 2015-09-05 DIAGNOSIS — H2512 Age-related nuclear cataract, left eye: Secondary | ICD-10-CM | POA: Diagnosis not present

## 2015-10-13 DIAGNOSIS — H2511 Age-related nuclear cataract, right eye: Secondary | ICD-10-CM | POA: Diagnosis not present

## 2015-10-24 DIAGNOSIS — H2511 Age-related nuclear cataract, right eye: Secondary | ICD-10-CM | POA: Diagnosis not present

## 2015-12-08 ENCOUNTER — Ambulatory Visit (INDEPENDENT_AMBULATORY_CARE_PROVIDER_SITE_OTHER): Payer: Medicare Other | Admitting: Family Medicine

## 2015-12-08 ENCOUNTER — Encounter: Payer: Self-pay | Admitting: Family Medicine

## 2015-12-08 VITALS — BP 132/68 | HR 69 | Temp 98.2°F | Ht 60.5 in | Wt 105.0 lb

## 2015-12-08 DIAGNOSIS — R42 Dizziness and giddiness: Secondary | ICD-10-CM

## 2015-12-08 NOTE — Patient Instructions (Signed)

## 2015-12-08 NOTE — Progress Notes (Signed)
Subjective:    Patient ID: Veronica Phillips, female    DOB: 1923/02/08, 82 y.o.   MRN: [id number]  HPI  Veronica Phillips is a 82 year old female who presents today with her daughter with "spells of dizziness" that occur approximately one time a day that have been present for 4 years. She reports that this symptom of dizziness has worsened over the past year and has recently been triggered with sinus symptoms. She denies N/V, fever, chills, night sweats, chest pain, palpitations, SOB, headaches, nosebleeds, numbness, tingling, and syncopal episodes. She reports sinus pressure/pain, post nasal drip, and rhinitis that have been present for 2 weeks which are now improving. She cannot verify if symptoms occur when rising from a chair or rolling over in bed. No treatments have been tried at home. She has a past medical history of HTN, arthritis, breast cancer (1989), diverticulosis of colon, glaucoma, atrophic gastritis, GERD, vitamin B 12 deficiency, pernicious anemia, and lactose intolerance.    Prior history notes that she had stopped taking her BP medication due to dizziness and Low BP in 12/2014. BP today is WNL. Medication list reviewed with patient and daughter. Patient is no longer taking isosorbide mononitrate or amlodipine.  Orthostatic BPs: No dizziness or near syncopal episodes present during exam and while obtaining BPs. Lying: 144/78 Sitting: 140/70 Standing: 132/68  BP obtained by CMA when rooming the patient is noted as 98/60. This BP is questionable due to cuff size used to obtain measurement.  Review of Systems  Constitutional: Negative for fever, chills and fatigue.  HENT: Positive for postnasal drip, sinus pressure and sneezing.   Respiratory: Negative for cough and shortness of breath.   Cardiovascular: Negative for chest pain, palpitations and leg swelling.  Gastrointestinal: Negative for nausea, vomiting, abdominal pain, constipation and blood in stool.  Genitourinary: Negative for  dysuria, urgency, frequency and hematuria.  Neurological: Positive for dizziness. Negative for speech difficulty, weakness, light-headedness, numbness and headaches.  Hematological: Does not bruise/bleed easily.  Psychiatric/Behavioral:       Denies depressed or anxious mood   Past Medical History  Diagnosis Date  . Hypertension   . Arthritis   . Breast cancer ([place]) 1989    left  . Diverticulosis of colon   . Glaucoma   . Atrophic gastritis   . GERD (gastroesophageal reflux disease)   . Vitamin B 12 deficiency   . Pernicious anemia   . Lactose intolerance      Social History   Social History  . Marital Status: Widowed    Spouse Name: N/A  . Number of Children: N/A  . Years of Education: N/A   Occupational History  . Not on file.   Social History Main Topics  . Smoking status: Never Smoker   . Smokeless tobacco: Never Used  . Alcohol Use: No  . Drug Use: No  . Sexual Activity: Not on file   Other Topics Concern  . 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 Breast surgery  1989    mastectomy of left breast  . Esophageal manometry N/A 10/27/2012    Procedure: ESOPHAGEAL MANOMETRY (EM);  Surgeon: Ladene Artist, MD;  Location: [facility];  Service: Endoscopy;  Laterality: N/A;  . Esophagogastroduodenoscopy N/A 07/09/2013    Procedure: ESOPHAGOGASTRODUODENOSCOPY (EGD);  Surgeon: Ladene Artist, MD;  Location: [facility];  Service:  Endoscopy;  Laterality: N/A;  . Botox injection N/A 07/09/2013    Procedure: BOTOX INJECTION;  Surgeon: Ladene Artist, MD;  Location: [facility];  Service: Endoscopy;  Laterality: N/A;  . Esophagogastroduodenoscopy N/A 10/07/2013    Procedure: ESOPHAGOGASTRODUODENOSCOPY (EGD);  Surgeon: Ladene Artist, MD;  Location: [facility];  Service: Endoscopy;  Laterality: N/A;  wtih botox  . Esophagogastroduodenoscopy (egd) with propofol N/A 06/28/2014      Procedure: ESOPHAGOGASTRODUODENOSCOPY (EGD) WITH PROPOFOL;  Surgeon: Ladene Artist, MD;  Location: [facility];  Service: Endoscopy;  Laterality: N/A;  . Botox injection N/A 06/28/2014    Procedure: BOTOX INJECTION;  Surgeon: Ladene Artist, MD;  Location: [facility];  Service: Endoscopy;  Laterality: N/A;  . Balloon dilation N/A 06/28/2014    Procedure: BALLOON DILATION;  Surgeon: Ladene Artist, MD;  Location: [facility];  Service: Endoscopy;  Laterality: N/A;    Family History  Problem Relation Age of Onset  . Hypertension Mother     family hx  . Lung cancer Father     family hx  . Stroke      family hx  . Heart disease Sister     family hx    Allergies  Allergen Reactions  . Septra [Bactrim] Swelling    Current Outpatient Prescriptions on File Prior to Visit  Medication Sig Dispense Refill  . Cranberry 200 MG CAPS Take 20 mg by mouth daily.     . Cyanocobalamin 2500 MCG SUBL Place under the tongue.    . ferrous gluconate (FERGON) 325 MG tablet Take 325 mg by mouth daily with breakfast.      . lansoprazole (PREVACID SOLUTAB) 30 MG disintegrating tablet Take 1 tablet (30 mg total) by mouth daily. 30 tablet 3  . latanoprost (XALATAN) 0.005 % ophthalmic solution   0  . mirtazapine (REMERON SOLTAB) 15 MG disintegrating tablet Take 1 tablet (15 mg total) by mouth at bedtime. 100 tablet 3  . Multiple Vitamin (MULTIVITAMIN) tablet Take 1 tablet by mouth daily.      . multivitamin-lutein (OCUVITE-LUTEIN) CAPS capsule Take 1 capsule by mouth daily.    . vitamin E 400 UNIT capsule Take 400 Units by mouth daily.    Marland Kitchen amLODipine (NORVASC) 5 MG tablet Take 1 tablet (5 mg total) by mouth daily. (Patient not taking: Reported on 01/06/2015) 30 tablet 2  . isosorbide mononitrate (IMDUR) 30 MG 24 hr tablet Take 0.5 tablets (15 mg total) by mouth daily. (Patient not taking: Reported on 01/06/2015) 30 tablet 3   No current facility-administered medications on file prior to visit.    BP  98/60 mmHg  Pulse 69  Temp(Src) 98.2 F (36.8 C) (Oral)  Ht 5' 0.5" (1.537 m)  Wt 105 lb (47.628 kg)  BMI 20.16 kg/m2  SpO2 95%       Objective:   Physical Exam  Constitutional: She is oriented to person, place, and time.  Thin, elderly female who is well nourished.  HENT:  Right Ear: Tympanic membrane normal.  Left Ear: Tympanic membrane normal.  Neck: Neck supple.  Cardiovascular: Normal rate and regular rhythm.   Pulmonary/Chest: Effort normal and breath sounds normal. She has no wheezes.  Abdominal: Soft. Bowel sounds are normal. There is no tenderness.  Lymphadenopathy:    She has no cervical adenopathy.  Neurological: She is alert and oriented to person, place, and time. She has normal strength. No sensory deficit. Coordination and gait normal.  Reflex Scores:      Patellar reflexes  are 2+ on the right side and 2+ on the left side. Patient quickly moved from a sitting to standing position when proceeding to exam table without symptom of dizziness noted  Skin: Skin is warm and dry. No rash noted.  Psychiatric: She has a normal mood and affect. Her behavior is normal. Judgment and thought content normal.      Assessment & Plan:  1. Dizziness and giddiness No dizziness present today during exam. Suspect symptoms may be related to orthostatic BP changes which may be exacerbated by recent sinus pressure changes. Discussed in length the importance of maintaining adequate fluid intake, eating small frequent meals, rising slowly from a sitting or lying position, and wearing compression stockings. Also provided exercises related to sitting on the side of the bed and moving feet back and forth between the heel/toe several times prior to getting out of bed. Further discussed the risk of falls that can occur and daughter stated that the patient is now living with her in her home. Written instructions related to signs/symptoms of orthostatic BP changes provided to patient. Lab work will  be obtained today for evaluation due to history of anemia.  Advised patient and daughter to follow up with her PCP next week if symptoms occur again with implementation of supportive measures discussed above. Recommended that patient returns also for an annual medicare wellness visit that is due in July.  Patient and daughter voiced understanding and agreed with plan.   - CBC with Differential/Platelet - Basic metabolic panel - Hepatic function panel  Delano Metz, FNP-C

## 2015-12-09 LAB — CBC WITH DIFFERENTIAL/PLATELET
BASOS PCT: 0.3 % (ref 0.0–3.0)
Basophils Absolute: 0 10*3/uL (ref 0.0–0.1)
EOS ABS: 0 10*3/uL (ref 0.0–0.7)
EOS PCT: 0 % (ref 0.0–5.0)
HCT: 31.8 % — ABNORMAL LOW (ref 36.0–46.0)
Hemoglobin: 10.6 g/dL — ABNORMAL LOW (ref 12.0–15.0)
Lymphocytes Relative: 32.1 % (ref 12.0–46.0)
Lymphs Abs: 1.5 10*3/uL (ref 0.7–4.0)
MCHC: 33.4 g/dL (ref 30.0–36.0)
MCV: 91 fl (ref 78.0–100.0)
MONO ABS: 0.5 10*3/uL (ref 0.1–1.0)
Monocytes Relative: 10.2 % (ref 3.0–12.0)
NEUTROS ABS: 2.6 10*3/uL (ref 1.4–7.7)
Neutrophils Relative %: 57.4 % (ref 43.0–77.0)
Platelets: 231 10*3/uL (ref 150.0–400.0)
RBC: 3.5 Mil/uL — ABNORMAL LOW (ref 3.87–5.11)
RDW: 15.1 % (ref 11.5–15.5)
WBC: 4.6 10*3/uL (ref 4.0–10.5)

## 2015-12-09 LAB — BASIC METABOLIC PANEL
BUN: 23 mg/dL (ref 6–23)
CHLORIDE: 108 meq/L (ref 96–112)
CO2: 29 mEq/L (ref 19–32)
Calcium: 8.9 mg/dL (ref 8.4–10.5)
Creatinine, Ser: 1.22 mg/dL — ABNORMAL HIGH (ref 0.40–1.20)
GFR: 53.19 mL/min — AB (ref >60.00)
GLUCOSE: 90 mg/dL (ref 70–99)
POTASSIUM: 4.3 meq/L (ref 3.5–5.1)
Sodium: 143 mEq/L (ref 135–145)

## 2015-12-09 LAB — HEPATIC FUNCTION PANEL
ALBUMIN: 3.5 g/dL (ref 3.5–5.2)
ALT: 10 U/L (ref 0–35)
AST: 15 U/L (ref 0–37)
Alkaline Phosphatase: 58 U/L (ref 39–117)
Bilirubin, Direct: 0.1 mg/dL (ref 0.0–0.3)
Total Bilirubin: 0.6 mg/dL (ref 0.2–1.2)
Total Protein: 6.1 g/dL (ref 6.0–8.3)

## 2015-12-29 ENCOUNTER — Emergency Department (HOSPITAL_COMMUNITY): Payer: Medicare Other

## 2015-12-29 ENCOUNTER — Encounter (HOSPITAL_COMMUNITY): Payer: Self-pay | Admitting: Nurse Practitioner

## 2015-12-29 ENCOUNTER — Observation Stay (HOSPITAL_COMMUNITY)
Admission: EM | Admit: 2015-12-29 | Discharge: 2015-12-31 | Disposition: A | Discharge status: Home / Self Care | Payer: Medicare Other | Attending: Internal Medicine | Admitting: Internal Medicine

## 2015-12-29 DIAGNOSIS — R1314 Dysphagia, pharyngoesophageal phase: Secondary | ICD-10-CM | POA: Insufficient documentation

## 2015-12-29 DIAGNOSIS — R072 Precordial pain: Secondary | ICD-10-CM | POA: Diagnosis not present

## 2015-12-29 DIAGNOSIS — K219 Gastro-esophageal reflux disease without esophagitis: Secondary | ICD-10-CM | POA: Diagnosis not present

## 2015-12-29 DIAGNOSIS — Z853 Personal history of malignant neoplasm of breast: Secondary | ICD-10-CM | POA: Diagnosis not present

## 2015-12-29 DIAGNOSIS — Y92000 Kitchen of unspecified non-institutional (private) residence as  the place of occurrence of the external cause: Secondary | ICD-10-CM | POA: Diagnosis not present

## 2015-12-29 DIAGNOSIS — H409 Unspecified glaucoma: Secondary | ICD-10-CM | POA: Diagnosis not present

## 2015-12-29 DIAGNOSIS — D510 Vitamin B12 deficiency anemia due to intrinsic factor deficiency: Secondary | ICD-10-CM | POA: Insufficient documentation

## 2015-12-29 DIAGNOSIS — K220 Achalasia of cardia: Secondary | ICD-10-CM | POA: Diagnosis not present

## 2015-12-29 DIAGNOSIS — M542 Cervicalgia: Secondary | ICD-10-CM | POA: Insufficient documentation

## 2015-12-29 DIAGNOSIS — Z881 Allergy status to other antibiotic agents status: Secondary | ICD-10-CM | POA: Insufficient documentation

## 2015-12-29 DIAGNOSIS — N183 Chronic kidney disease, stage 3 unspecified: Secondary | ICD-10-CM | POA: Diagnosis present

## 2015-12-29 DIAGNOSIS — D649 Anemia, unspecified: Secondary | ICD-10-CM | POA: Insufficient documentation

## 2015-12-29 DIAGNOSIS — S199XXA Unspecified injury of neck, initial encounter: Secondary | ICD-10-CM | POA: Diagnosis not present

## 2015-12-29 DIAGNOSIS — I951 Orthostatic hypotension: Secondary | ICD-10-CM | POA: Diagnosis not present

## 2015-12-29 DIAGNOSIS — Z9012 Acquired absence of left breast and nipple: Secondary | ICD-10-CM | POA: Insufficient documentation

## 2015-12-29 DIAGNOSIS — Z887 Allergy status to serum and vaccine status: Secondary | ICD-10-CM | POA: Diagnosis not present

## 2015-12-29 DIAGNOSIS — S0003XA Contusion of scalp, initial encounter: Secondary | ICD-10-CM | POA: Diagnosis not present

## 2015-12-29 DIAGNOSIS — S299XXA Unspecified injury of thorax, initial encounter: Secondary | ICD-10-CM | POA: Diagnosis not present

## 2015-12-29 DIAGNOSIS — E860 Dehydration: Secondary | ICD-10-CM | POA: Insufficient documentation

## 2015-12-29 DIAGNOSIS — E739 Lactose intolerance, unspecified: Secondary | ICD-10-CM | POA: Insufficient documentation

## 2015-12-29 DIAGNOSIS — I129 Hypertensive chronic kidney disease with stage 1 through stage 4 chronic kidney disease, or unspecified chronic kidney disease: Secondary | ICD-10-CM | POA: Diagnosis not present

## 2015-12-29 DIAGNOSIS — S3992XA Unspecified injury of lower back, initial encounter: Secondary | ICD-10-CM | POA: Diagnosis not present

## 2015-12-29 DIAGNOSIS — M419 Scoliosis, unspecified: Secondary | ICD-10-CM | POA: Insufficient documentation

## 2015-12-29 DIAGNOSIS — R55 Syncope and collapse: Secondary | ICD-10-CM | POA: Diagnosis present

## 2015-12-29 DIAGNOSIS — M4802 Spinal stenosis, cervical region: Secondary | ICD-10-CM | POA: Diagnosis not present

## 2015-12-29 DIAGNOSIS — W19XXXA Unspecified fall, initial encounter: Secondary | ICD-10-CM | POA: Insufficient documentation

## 2015-12-29 DIAGNOSIS — S069X9A Unspecified intracranial injury with loss of consciousness of unspecified duration, initial encounter: Secondary | ICD-10-CM | POA: Diagnosis not present

## 2015-12-29 DIAGNOSIS — I700 Atherosclerosis of aorta: Secondary | ICD-10-CM | POA: Insufficient documentation

## 2015-12-29 DIAGNOSIS — M4316 Spondylolisthesis, lumbar region: Secondary | ICD-10-CM | POA: Diagnosis not present

## 2015-12-29 DIAGNOSIS — K2940 Chronic atrophic gastritis without bleeding: Secondary | ICD-10-CM | POA: Diagnosis not present

## 2015-12-29 DIAGNOSIS — D518 Other vitamin B12 deficiency anemias: Secondary | ICD-10-CM | POA: Diagnosis present

## 2015-12-29 DIAGNOSIS — N390 Urinary tract infection, site not specified: Secondary | ICD-10-CM | POA: Diagnosis not present

## 2015-12-29 DIAGNOSIS — M5136 Other intervertebral disc degeneration, lumbar region: Secondary | ICD-10-CM | POA: Insufficient documentation

## 2015-12-29 DIAGNOSIS — M545 Low back pain: Secondary | ICD-10-CM | POA: Diagnosis not present

## 2015-12-29 DIAGNOSIS — M1990 Unspecified osteoarthritis, unspecified site: Secondary | ICD-10-CM | POA: Diagnosis not present

## 2015-12-29 DIAGNOSIS — R42 Dizziness and giddiness: Secondary | ICD-10-CM

## 2015-12-29 DIAGNOSIS — M5134 Other intervertebral disc degeneration, thoracic region: Secondary | ICD-10-CM | POA: Insufficient documentation

## 2015-12-29 DIAGNOSIS — M546 Pain in thoracic spine: Secondary | ICD-10-CM | POA: Diagnosis not present

## 2015-12-29 DIAGNOSIS — Z8719 Personal history of other diseases of the digestive system: Secondary | ICD-10-CM | POA: Diagnosis not present

## 2015-12-29 DIAGNOSIS — F329 Major depressive disorder, single episode, unspecified: Secondary | ICD-10-CM | POA: Diagnosis not present

## 2015-12-29 HISTORY — DX: Headache, unspecified: R51.9

## 2015-12-29 HISTORY — DX: Malignant neoplasm of unspecified site of left female breast: C50.912

## 2015-12-29 HISTORY — DX: Headache: R51

## 2015-12-29 LAB — CBC
HEMATOCRIT: 35.8 % — AB (ref 36.0–46.0)
HEMOGLOBIN: 11.3 g/dL — AB (ref 12.0–15.0)
MCH: 29.5 pg (ref 26.0–34.0)
MCHC: 31.6 g/dL (ref 30.0–36.0)
MCV: 93.5 fL (ref 78.0–100.0)
Platelets: 193 10*3/uL (ref 150–400)
RBC: 3.83 MIL/uL — AB (ref 3.87–5.11)
RDW: 14.9 % (ref 11.5–15.5)
WBC: 10.4 10*3/uL (ref 4.0–10.5)

## 2015-12-29 LAB — BASIC METABOLIC PANEL
ANION GAP: 6 (ref 5–15)
BUN: 19 mg/dL (ref 6–20)
CO2: 25 mmol/L (ref 22–32)
Calcium: 9 mg/dL (ref 8.9–10.3)
Chloride: 111 mmol/L (ref 101–111)
Creatinine, Ser: 1.16 mg/dL — ABNORMAL HIGH (ref 0.44–1.00)
GFR, EST AFRICAN AMERICAN: 47 mL/min — AB (ref >60)
GFR, EST NON AFRICAN AMERICAN: 40 mL/min — AB (ref >60)
GLUCOSE: 97 mg/dL (ref 65–99)
POTASSIUM: 4 mmol/L (ref 3.5–5.1)
Sodium: 142 mmol/L (ref 135–145)

## 2015-12-29 LAB — URINALYSIS, ROUTINE W REFLEX MICROSCOPIC
Bilirubin Urine: NEGATIVE
Glucose, UA: NEGATIVE mg/dL
Ketones, ur: NEGATIVE mg/dL
NITRITE: NEGATIVE
PH: 6 (ref 5.0–8.0)
Protein, ur: NEGATIVE mg/dL
SPECIFIC GRAVITY, URINE: 1.016 (ref 1.005–1.030)

## 2015-12-29 LAB — URINE MICROSCOPIC-ADD ON

## 2015-12-29 LAB — I-STAT TROPONIN, ED: TROPONIN I, POC: 0.02 ng/mL (ref 0.00–0.08)

## 2015-12-29 MED ORDER — VITAMIN B-12 100 MCG PO TABS
100.0000 ug | ORAL_TABLET | Freq: Every day | ORAL | Status: DC
Start: 2015-12-30 — End: 2015-12-31
  Administered 2015-12-30 – 2015-12-31 (×2): 100 ug via ORAL
  Filled 2015-12-29 (×2): qty 1

## 2015-12-29 MED ORDER — LATANOPROST 0.005 % OP SOLN
1.0000 [drp] | Freq: Every day | OPHTHALMIC | Status: DC
  Administered 2015-12-29 – 2015-12-30 (×2): 1 [drp] via OPHTHALMIC
  Filled 2015-12-29: qty 2.5

## 2015-12-29 MED ORDER — VITAMIN C 250 MG PO TABS
250.0000 mg | ORAL_TABLET | Freq: Every day | ORAL | Status: DC
  Administered 2015-12-30 – 2015-12-31 (×2): 250 mg via ORAL
  Filled 2015-12-29 (×2): qty 1

## 2015-12-29 MED ORDER — DEXTROSE 5 % IV SOLN
1.0000 g | Freq: Once | INTRAVENOUS | Status: AC
  Administered 2015-12-29: 1 g via INTRAVENOUS
  Filled 2015-12-29: qty 10

## 2015-12-29 MED ORDER — FERROUS GLUCONATE 324 (38 FE) MG PO TABS
325.0000 mg | ORAL_TABLET | Freq: Every day | ORAL | Status: DC
  Administered 2015-12-30 – 2015-12-31 (×2): 325 mg via ORAL
  Filled 2015-12-29 (×2): qty 1

## 2015-12-29 MED ORDER — ACETAMINOPHEN 500 MG PO TABS
500.0000 mg | ORAL_TABLET | Freq: Four times a day (QID) | ORAL | Status: DC | PRN
  Administered 2015-12-31: 500 mg via ORAL
  Filled 2015-12-29: qty 1

## 2015-12-29 MED ORDER — SODIUM CHLORIDE 0.9% FLUSH
3.0000 mL | Freq: Two times a day (BID) | INTRAVENOUS | Status: DC
  Administered 2015-12-29 – 2015-12-30 (×2): 3 mL via INTRAVENOUS

## 2015-12-29 MED ORDER — DEXTROSE 5 % IV SOLN
1.0000 g | INTRAVENOUS | Status: DC
  Administered 2015-12-30: 1 g via INTRAVENOUS
  Filled 2015-12-29 (×2): qty 10

## 2015-12-29 MED ORDER — LUTEIN 6 MG PO CAPS: 1.0000 | ORAL_CAPSULE | Freq: Every morning | ORAL | Status: DC

## 2015-12-29 MED ORDER — MIRTAZAPINE 15 MG PO TBDP
15.0000 mg | ORAL_TABLET | Freq: Every day | ORAL | Status: DC
  Administered 2015-12-29 – 2015-12-30 (×2): 15 mg via ORAL
  Filled 2015-12-29 (×2): qty 1

## 2015-12-29 MED ORDER — HYDRALAZINE HCL 20 MG/ML IJ SOLN
5.0000 mg | INTRAMUSCULAR | Status: DC | PRN
  Administered 2015-12-31: 5 mg via INTRAVENOUS
  Filled 2015-12-29: qty 1

## 2015-12-29 MED ORDER — SODIUM CHLORIDE 0.9 % IV SOLN
INTRAVENOUS | Status: DC
  Administered 2015-12-29: 21:00:00 via INTRAVENOUS
  Administered 2015-12-31: 1000 mL via INTRAVENOUS

## 2015-12-29 MED ORDER — DORZOLAMIDE HCL 2 % OP SOLN
1.0000 [drp] | Freq: Two times a day (BID) | OPHTHALMIC | Status: DC
  Administered 2015-12-29 – 2015-12-31 (×4): 1 [drp] via OPHTHALMIC
  Filled 2015-12-29: qty 10

## 2015-12-29 MED ORDER — TIMOLOL MALEATE 0.5 % OP SOLN
1.0000 [drp] | Freq: Every day | OPHTHALMIC | Status: DC
  Administered 2015-12-29 – 2015-12-31 (×3): 1 [drp] via OPHTHALMIC
  Filled 2015-12-29: qty 5

## 2015-12-29 MED ORDER — CRANBERRY 250 MG PO TABS: 1.0000 | ORAL_TABLET | Freq: Every morning | ORAL | Status: DC

## 2015-12-29 MED ORDER — ONDANSETRON HCL 4 MG/2ML IJ SOLN: 4.0000 mg | Freq: Four times a day (QID) | INTRAMUSCULAR | Status: DC | PRN

## 2015-12-29 MED ORDER — SODIUM CHLORIDE 0.9 % IV BOLUS (SEPSIS)
500.0000 mL | Freq: Once | INTRAVENOUS | Status: AC
  Administered 2015-12-29: 500 mL via INTRAVENOUS

## 2015-12-29 MED ORDER — VITAMIN E 180 MG (400 UNIT) PO CAPS
400.0000 [IU] | ORAL_CAPSULE | Freq: Every day | ORAL | Status: DC
  Administered 2015-12-30 – 2015-12-31 (×2): 400 [IU] via ORAL
  Filled 2015-12-29 (×2): qty 1

## 2015-12-29 MED ORDER — ONE-DAILY MULTI VITAMINS PO TABS: 1.0000 | ORAL_TABLET | Freq: Every day | ORAL | Status: DC

## 2015-12-29 MED ORDER — ONDANSETRON HCL 4 MG PO TABS: 4.0000 mg | ORAL_TABLET | Freq: Four times a day (QID) | ORAL | Status: DC | PRN

## 2015-12-29 MED ORDER — ADULT MULTIVITAMIN W/MINERALS CH
1.0000 | ORAL_TABLET | Freq: Every day | ORAL | Status: DC
  Administered 2015-12-30 – 2015-12-31 (×2): 1 via ORAL
  Filled 2015-12-29 (×2): qty 1

## 2015-12-29 MED ORDER — ALUM & MAG HYDROXIDE-SIMETH 200-200-20 MG/5ML PO SUSP: 15.0000 mL | Freq: Four times a day (QID) | ORAL | Status: DC | PRN

## 2015-12-29 MED ORDER — PROSIGHT PO TABS
1.0000 | ORAL_TABLET | Freq: Every day | ORAL | Status: DC
  Administered 2015-12-30 – 2015-12-31 (×2): 1 via ORAL
  Filled 2015-12-29 (×2): qty 1

## 2015-12-29 NOTE — ED Notes (Signed)
She was walking through house today and became dizzy and fell. She reports recent increased dizzy episodes and states she almost passed out today. She c/o bilateral shoulder, upper back, posterior head pain since. Hematoma to posterior head. She is alert, breathing easily

## 2015-12-29 NOTE — ED Provider Notes (Signed)
CSN: [id number]     Arrival date & time 12/29/15  1313 History   First MD Initiated Contact with Patient 12/29/15 1502     Chief Complaint  Patient presents with  . Fall  . Dizziness    Veronica Phillips is a 82 y.o. female who presents to the emergency department with her daughter after she fell today. Patient reports she was standing in her kitchen when she became lightheaded and fell on her windowsill on her posterior head. Family reports possible loss of consciousness for less than a second. Daughter reports she's been acting normally since the fall. Patient complains of posterior head pain, substernal chest pain with inspiration, and upper back pain with movement. She has a history of orthostatic hypotension. She has not eaten or drank anything today. She denies Fevers, coughing, shortness of breath, numbness, weakness, tingling, changes to her vision, neck pain, abdominal pain, nausea, vomiting, diarrhea, rashes.   Patient is a 82 y.o. female presenting with fall and dizziness. The history is provided by the patient and a relative. No language interpreter was used.  Fall Associated symptoms include chest pain. Pertinent negatives include no abdominal pain, chills, congestion, coughing, fever, headaches, nausea, neck pain, numbness, rash, sore throat, vomiting or weakness.  Dizziness Associated symptoms: chest pain   Associated symptoms: no diarrhea, no headaches, no nausea, no palpitations, no shortness of breath, no vomiting and no weakness     Past Medical History  Diagnosis Date  . Hypertension   . Arthritis   . Breast cancer ([place]) 1989    left  . Diverticulosis of colon   . Glaucoma   . Atrophic gastritis   . GERD (gastroesophageal reflux disease)   . Vitamin B 12 deficiency   . Pernicious anemia   . Lactose intolerance    Past Surgical History  Procedure Laterality Date  . Breast surgery  1989    mastectomy of left breast  . Esophageal manometry N/A 10/27/2012    Procedure:  ESOPHAGEAL MANOMETRY (EM);  Surgeon: Ladene Artist, MD;  Location: [facility];  Service: Endoscopy;  Laterality: N/A;  . Esophagogastroduodenoscopy N/A 07/09/2013    Procedure: ESOPHAGOGASTRODUODENOSCOPY (EGD);  Surgeon: Ladene Artist, MD;  Location: [facility];  Service: Endoscopy;  Laterality: N/A;  . Botox injection N/A 07/09/2013    Procedure: BOTOX INJECTION;  Surgeon: Ladene Artist, MD;  Location: [facility];  Service: Endoscopy;  Laterality: N/A;  . Esophagogastroduodenoscopy N/A 10/07/2013    Procedure: ESOPHAGOGASTRODUODENOSCOPY (EGD);  Surgeon: Ladene Artist, MD;  Location: [facility];  Service: Endoscopy;  Laterality: N/A;  wtih botox  . Esophagogastroduodenoscopy (egd) with propofol N/A 06/28/2014    Procedure: ESOPHAGOGASTRODUODENOSCOPY (EGD) WITH PROPOFOL;  Surgeon: Ladene Artist, MD;  Location: [facility];  Service: Endoscopy;  Laterality: N/A;  . Botox injection N/A 06/28/2014    Procedure: BOTOX INJECTION;  Surgeon: Ladene Artist, MD;  Location: [facility];  Service: Endoscopy;  Laterality: N/A;  . Balloon dilation N/A 06/28/2014    Procedure: BALLOON DILATION;  Surgeon: Ladene Artist, MD;  Location: [facility];  Service: Endoscopy;  Laterality: N/A;   Family History  Problem Relation Age of Onset  . Hypertension Mother     family hx  . Lung cancer Father     family hx  . Stroke      family hx  . Heart disease Sister     family hx   Social History  Substance Use Topics  . Smoking status: Never Smoker   .  Smokeless tobacco: Never Used  . Alcohol Use: No   OB History    No data available     Review of Systems  Constitutional: Negative for fever and chills.  HENT: Negative for congestion and sore throat.   Eyes: Negative for visual disturbance.  Respiratory: Negative for cough, shortness of breath and wheezing.   Cardiovascular: Positive for chest pain. Negative for palpitations.  Gastrointestinal: Negative for nausea, vomiting, abdominal pain  and diarrhea.  Genitourinary: Negative for dysuria.  Musculoskeletal: Positive for back pain. Negative for neck pain and neck stiffness.  Skin: Negative for rash and wound.  Neurological: Positive for dizziness, syncope (questionable brief LOC ) and light-headedness. Negative for seizures, weakness, numbness and headaches.      Allergies  Septra and Sulfa antibiotics  Home Medications   Prior to Admission medications   Medication Sig Start Date End Date Taking? Authorizing Provider  acetaminophen (TYLENOL) 500 MG tablet Take 500 mg by mouth every 6 (six) hours as needed for mild pain or moderate pain.   Yes Historical Provider, MD  alum & mag hydroxide-simeth (MYLANTA) 200-200-20 MG/5ML suspension Take 15 mLs by mouth every 6 (six) hours as needed for indigestion or heartburn.   Yes Historical Provider, MD  Ascorbic Acid (VITAMIN C PO) Take 1 tablet by mouth every morning.   Yes Historical Provider, MD  CRANBERRY PO Take 1 tablet by mouth every morning.   Yes Historical Provider, MD  Cyanocobalamin (VITAMIN B-12 PO) Take 1 tablet by mouth daily.   Yes Historical Provider, MD  dorzolamide (TRUSOPT) 2 % ophthalmic solution Instill 1 drop into both eyes twice a day 11/09/15  Yes Historical Provider, MD  ferrous gluconate (FERGON) 325 MG tablet Take 325 mg by mouth daily with breakfast.     Yes Historical Provider, MD  latanoprost (XALATAN) 0.005 % ophthalmic solution Place 1 drop into both eyes at bedtime.  12/03/14  Yes Historical Provider, MD  LUTEIN PO Take 1 capsule by mouth every morning.   Yes Historical Provider, MD  mirtazapine (REMERON SOLTAB) 15 MG disintegrating tablet Take 1 tablet (15 mg total) by mouth at bedtime. Patient taking differently: Take 15 mg by mouth at bedtime as needed.  05/04/14  Yes Dorena Cookey, MD  Multiple Vitamins-Minerals (ONE-A-DAY WOMENS 50 PLUS PO) Take 1 tablet by mouth every morning.   Yes Historical Provider, MD  timolol (TIMOPTIC) 0.5 % ophthalmic  solution INSTILL 1 DROP INTO BOTH EYES IN THE MORNING 11/09/15  Yes Historical Provider, MD  vitamin E 400 UNIT capsule Take 400 Units by mouth daily.   Yes Historical Provider, MD  amLODipine (NORVASC) 5 MG tablet Take 1 tablet (5 mg total) by mouth daily. Patient not taking: Reported on 01/06/2015 11/06/14   Kelvin Cellar, MD  isosorbide mononitrate (IMDUR) 30 MG 24 hr tablet Take 0.5 tablets (15 mg total) by mouth daily. Patient not taking: Reported on 01/06/2015 11/06/14   Kelvin Cellar, MD  lansoprazole (PREVACID SOLUTAB) 30 MG disintegrating tablet Take 1 tablet (30 mg total) by mouth daily. Patient not taking: Reported on 12/29/2015 06/14/14   Lori P Hvozdovic, PA-C   BP 130/76 mmHg  Pulse 93  Temp(Src) 97.8 F (36.6 C) (Oral)  Resp 20  SpO2 100% Physical Exam  Constitutional: She is oriented to person, place, and time. She appears well-developed and well-nourished. No distress.  Nontoxic-appearing.  HENT:  Head: Normocephalic.  Right Ear: External ear normal.  Left Ear: External ear normal.  Mouth/Throat: Oropharynx is clear and  moist.  Small posterior hematoma noted. No other evidence of head trauma.    Eyes: Conjunctivae and EOM are normal. Pupils are equal, round, and reactive to light. Right eye exhibits no discharge. Left eye exhibits no discharge.  Neck: Normal range of motion. Neck supple. No JVD present. No tracheal deviation present.  No midline neck tenderness.  Cardiovascular: Normal rate, regular rhythm, normal heart sounds and intact distal pulses.  Exam reveals no gallop and no friction rub.   No murmur heard. Pulmonary/Chest: Effort normal and breath sounds normal. No stridor. No respiratory distress. She has no wheezes. She has no rales. She exhibits tenderness.  Lungs clear to auscultation bilaterally. Some mild substernal chest wall tenderness to palpation. Patient reports her pain is reproducible with deep inspiration. No lateral chest wall tenderness to  palpation. Symmetric chest expansion bilaterally. No crepitus.  Abdominal: Soft. Bowel sounds are normal. She exhibits no distension. There is no tenderness. There is no guarding.  Musculoskeletal: Normal range of motion. She exhibits no edema or tenderness.  No midline neck or back tenderness. No bilateral shoulder tenderness to palpation. No clavicle tenderness to palpation. No lateral chest wall tenderness. No crepitus.  Lymphadenopathy:    She has no cervical adenopathy.  Neurological: She is alert and oriented to person, place, and time. No cranial nerve deficit. Coordination normal.  The patient is alert and oriented 3. Cranial nerves are intact. Speech is clear and coherent. Sensation is intact in bilateral upper and lower extremity. Finger to nose is intact bilaterally.  Skin: Skin is warm and dry. No rash noted. She is not diaphoretic. No erythema. No pallor.  Psychiatric: She has a normal mood and affect. Her behavior is normal.  Nursing note and vitals reviewed.   ED Course  Procedures (including critical care time) Labs Review Labs Reviewed  BASIC METABOLIC PANEL - Abnormal; Notable for the following:    Creatinine, Ser 1.16 (*)    GFR calc non Af Amer 40 (*)    GFR calc Af Amer 47 (*)    All other components within normal limits  CBC - Abnormal; Notable for the following:    RBC 3.83 (*)    Hemoglobin 11.3 (*)    HCT 35.8 (*)    All other components within normal limits  URINALYSIS, ROUTINE W REFLEX MICROSCOPIC (NOT AT [facility]) - Abnormal; Notable for the following:    APPearance CLOUDY (*)    Hgb urine dipstick SMALL (*)    Leukocytes, UA LARGE (*)    All other components within normal limits  URINE MICROSCOPIC-ADD ON - Abnormal; Notable for the following:    Squamous Epithelial / LPF 0-5 (*)    Bacteria, UA MANY (*)    All other components within normal limits  URINE CULTURE  BASIC METABOLIC PANEL  CBC  I-STAT TROPOININ, ED    Imaging Review Dg Chest 2  View  12/29/2015  CLINICAL DATA:  82 year old female status post fall today following episode of dizziness. Thoracolumbar spine pain. Initial encounter. EXAM: CHEST  2 VIEW COMPARISON:  Thoracic and lumbar spine radiographs today which are reported separately. Chest radiographs 11/05/2014. FINDINGS: Supine AP and also lateral views of the chest. Stable cardiomegaly and mediastinal contours. Stable tortuosity of thoracic aorta. Calcified aortic atherosclerosis. Chronic postoperative changes to the left axilla and chest wall with surgical clips. No pneumothorax, pulmonary edema, pleural effusion or confluent pulmonary opacity. Chronic mild levoconvex scoliosis at the thoracolumbar junction. No definite No acute osseous abnormality identified. IMPRESSION: Stable  cardiomegaly. No acute cardiopulmonary abnormality. Calcified aortic atherosclerosis. Electronically Signed   By: Genevie Ann M.D.   On: 12/29/2015 16:51   Dg Thoracic Spine 2 View  12/29/2015  CLINICAL DATA:  [place] today, dizziness, pain from upper thoracic to lower lumbar spine, chest pain with inspiration EXAM: THORACIC SPINE 2 VIEWS COMPARISON:  Chest two views 11/05/2014 FINDINGS: Twelve pairs of ribs, last pair hypoplastic. Levoconvex thoracic scoliosis apex T10. Aortic atherosclerosis. Scattered disc space narrowing and endplate spur formation. Mild chronic anterior height loss of a mid thoracic vertebra unchanged. No definite acute fracture, subluxation or bone destruction. Upper thoracic spine and cervicothoracic junction are inadequately visualized on swimmer's view due to superimposition of the shoulders and quantum mottling artifacts. Significant costal cartilaginous calcification. IMPRESSION: Degenerative disc disease changes with levoconvex scoliosis thoracic spine. Osseous demineralization without acute bony abnormalities. Aortic atherosclerosis. Electronically Signed   By: Lavonia Dana M.D.   On: 12/29/2015 16:55   Dg Lumbar Spine  Complete  12/29/2015  CLINICAL DATA:  [place] today, dizziness, pain from upper thoracic to lower lumbar spine, chest pain with inspiration EXAM: LUMBAR SPINE - COMPLETE 4+ VIEW COMPARISON:  None FINDINGS: Five non-rib-bearing lumbar vertebra. Dextroconvex lumbar scoliosis. Scattered disc space narrowing and endplate spur formation. Extensive facet degenerative changes lower lumbar spine. 9 mm of anterolisthesis at L4-L5 with 6 mm anterolisthesis L5-S1. Vertebral body heights maintained without fracture or bone destruction. RIGHT SI joint poorly visualized. Extensive atherosclerotic calcifications aorta and iliac arteries. Large calcification 14 x 12 mm projects over LEFT kidney. IMPRESSION: Marked osseous demineralization with degenerative disc and facet disease changes of the lumbar spine associated with mild dextroconvex scoliosis. Significant anterolisthesis at L4-L5 and L5-S1 as above likely due to degenerative disc and facet disease. No definite acute fracture identified. Suspected large LEFT renal calculus 4 x 12 mm. Electronically Signed   By: Lavonia Dana M.D.   On: 12/29/2015 16:52   Ct Head Wo Contrast  12/29/2015  CLINICAL DATA:  82 year old female post fall. Hit back of head. Loss of consciousness. Neck pain. History of high blood pressure and breast cancer. Initial encounter. EXAM: CT HEAD WITHOUT CONTRAST CT CERVICAL SPINE WITHOUT CONTRAST TECHNIQUE: Multidetector CT imaging of the head and cervical spine was performed following the standard protocol without intravenous contrast. Multiplanar CT image reconstructions of the cervical spine were also generated. COMPARISON:  09/28/2012 head CT. FINDINGS: CT HEAD FINDINGS No skull fracture or intracranial hemorrhage. No CT evidence of large acute infarct. Mild global atrophy without hydrocephalus. No intracranial mass lesion noted on this unenhanced exam. Vascular calcifications. No acute orbital abnormality. Mastoid air cells, middle ear cavities and  visualized paranasal sinuses are clear. CT CERVICAL SPINE FINDINGS No cervical spine fracture noted. Curvature of the cervical and upper thoracic spine. No abnormal prevertebral soft tissue swelling. Fusion C4-5 and C5-6. Prominent cervical spondylotic changes with various degrees of spinal stenosis, cord flattening and foraminal narrowing C3-4 thru C6-7. Dilated secretion filled esophagus. Carotid bifurcation calcifications. IMPRESSION: CT HEAD No skull fracture or intracranial hemorrhage. Mild global atrophy. CT CERVICAL SPINE No cervical spine fracture noted. Curvature of the cervical and upper thoracic spine. Fusion C4-5 and C5-6. Prominent cervical spondylotic changes with various degrees of spinal stenosis, cord flattening and foraminal narrowing C3-4 thru C6-7. Dilated secretion filled esophagus. Electronically Signed   By: Genia Del M.D.   On: 12/29/2015 17:19   Ct Cervical Spine Wo Contrast  12/29/2015  CLINICAL DATA:  82 year old female post fall. Hit back of head. Loss  of consciousness. Neck pain. History of high blood pressure and breast cancer. Initial encounter. EXAM: CT HEAD WITHOUT CONTRAST CT CERVICAL SPINE WITHOUT CONTRAST TECHNIQUE: Multidetector CT imaging of the head and cervical spine was performed following the standard protocol without intravenous contrast. Multiplanar CT image reconstructions of the cervical spine were also generated. COMPARISON:  09/28/2012 head CT. FINDINGS: CT HEAD FINDINGS No skull fracture or intracranial hemorrhage. No CT evidence of large acute infarct. Mild global atrophy without hydrocephalus. No intracranial mass lesion noted on this unenhanced exam. Vascular calcifications. No acute orbital abnormality. Mastoid air cells, middle ear cavities and visualized paranasal sinuses are clear. CT CERVICAL SPINE FINDINGS No cervical spine fracture noted. Curvature of the cervical and upper thoracic spine. No abnormal prevertebral soft tissue swelling. Fusion C4-5 and  C5-6. Prominent cervical spondylotic changes with various degrees of spinal stenosis, cord flattening and foraminal narrowing C3-4 thru C6-7. Dilated secretion filled esophagus. Carotid bifurcation calcifications. IMPRESSION: CT HEAD No skull fracture or intracranial hemorrhage. Mild global atrophy. CT CERVICAL SPINE No cervical spine fracture noted. Curvature of the cervical and upper thoracic spine. Fusion C4-5 and C5-6. Prominent cervical spondylotic changes with various degrees of spinal stenosis, cord flattening and foraminal narrowing C3-4 thru C6-7. Dilated secretion filled esophagus. Electronically Signed   By: Genia Del M.D.   On: 12/29/2015 17:19   I have personally reviewed and evaluated these images and lab results as part of my medical decision-making.   EKG Interpretation None      Filed Vitals:   12/29/15 1321 12/29/15 1730 12/29/15 1800 12/29/15 2000  BP: 133/79 105/48 159/78 130/76  Pulse: 68  80 93  Temp: 97.8 F (36.6 C)     TempSrc: Oral     Resp: 16 13 23 20   SpO2: 97%  98% 100%     MDM   Meds given in ED:  Medications  cefTRIAXone (ROCEPHIN) 1 g in dextrose 5 % 50 mL IVPB (not administered)  sodium chloride 0.9 % bolus 500 mL (not administered)  acetaminophen (TYLENOL) tablet 500 mg (not administered)  alum & mag hydroxide-simeth (MAALOX/MYLANTA) 200-200-20 MG/5ML suspension 15 mL (not administered)  vitamin C (ASCORBIC ACID) tablet 250 mg (not administered)  Cranberry TABS 250 mg (not administered)  vitamin B-12 (CYANOCOBALAMIN) tablet 100 mcg (not administered)  Lutein CAPS 6 mg (not administered)  timolol (TIMOPTIC) 0.5 % ophthalmic solution 1 drop (not administered)  dorzolamide (TRUSOPT) 2 % ophthalmic solution 1 drop (not administered)  latanoprost (XALATAN) 0.005 % ophthalmic solution 1 drop (not administered)  mirtazapine (REMERON SOL-TAB) disintegrating tablet 15 mg (not administered)  vitamin E capsule 400 Units (not administered)  ferrous  gluconate (FERGON) tablet 325 mg (not administered)  multivitamin tablet 1 tablet (not administered)  0.9 %  sodium chloride infusion (not administered)  hydrALAZINE (APRESOLINE) injection 5 mg (not administered)  sodium chloride flush (NS) 0.9 % injection 3 mL (not administered)  ondansetron (ZOFRAN) tablet 4 mg (not administered)    Or  ondansetron (ZOFRAN) injection 4 mg (not administered)  sodium chloride 0.9 % bolus 500 mL (0 mLs Intravenous Stopped 12/29/15 2004)  cefTRIAXone (ROCEPHIN) 1 g in dextrose 5 % 50 mL IVPB (0 g Intravenous Stopped 12/29/15 1928)    New Prescriptions   No medications on file    Final diagnoses:  Orthostatic hypotension  Fall, initial encounter  UTI (lower urinary tract infection)   This  is a 82 y.o. female who presents to the emergency department with her daughter after she fell today.  Patient reports she was standing in her kitchen when she became lightheaded and fell on her windowsill on her posterior head. Family reports possible loss of consciousness for less than a second. Daughter reports she's been acting normally since the fall. Patient complains of posterior head pain, substernal chest pain with inspiration, and upper back pain with movement. She has a history of orthostatic hypotension. She has not eaten or drank anything today. She denies Fevers or recent illness.  On exam the patient is afebrile nontoxic appearing. She does have a small posterior head hematoma. No crepitus. No other signs of head injury. She complains of substernal pain in her chest with deep inspiration. No crepitus or chest pain no lateral chest wall pain. Lungs are clear to auscultation bilaterally. No back tenderness to palpation. She complains of thoracic back pain. She has no focal neurological deficits. She is alert and oriented 3. Troponin is not elevated. Urinalysis shows large leukocytes with too numerous to count white blood cells and many bacteria. Urine sent for culture.  BMP shows a mildly elevated creatinine of 1.16. CBC shows no leukocytosis. CT head shows no acute findings. It does indicate mild global atrophy. CT cervical spine shows no cervical spine fracture. Chest x-ray shows stable cardiomegaly. No acute cardiopulmonary abnormality. Lumbar spine x-ray shows osseous demineralization with degenerative disc and facet disease. No definite acute fracture identified. Patient has no point tenderness along her lumbar spine. There is a suspected large left renal calculus. Thoracic spine x-ray shows degenerative disc disease changes with scoliosis. No acute bony abnormalities. Patient started on Rocephin for a urinary tract infection. Patient was also found to be hypotensive with a blood pressure lying of [id number] systolic and standing pressure of 70 systolic. Will admit for UTI, fall and orthostatic hypotension. Patient is in agreement with plan. Prior to admission patient is tolerating by mouth.   I consulted with Dr. Blaine Hamper who accepted the patient for admission and requested tele temporary admission orders.   This patient was dicussed with and evaluated by Dr. Darl Householder who agrees with assessment plan.   Waynetta Pean, PA-C 12/29/15 2018  Wandra Arthurs, MD 12/29/15 [id number]

## 2015-12-29 NOTE — H&P (Signed)
History and Physical    Veronica Phillips [id number] DOB: 01-26-1923 DOA: 12/29/2015  Referring MD/NP/PA:   PCP: Dorothyann Peng, NP   Patient coming from:  The patient is coming from home.  At baseline, pt is partially is dependent for most of ADL.         Chief Complaint: Dizziness, fall and increased urinary frequency  HPI: Veronica Phillips is a 82 y.o. female with medical history significant of hypertension, GERD, depression, glaucoma, achalasia, pernicious anemia, atrophic gastritis, CKD-III, breast cancer ( s/p of mastectomy), who presents with dizziness, fall and increased urinary frequency.  Pt had fall when she was standing in her kitchen and felt lightheaded today. She fell on her windowsill on her posterior head. Family reports possible loss of consciousness for less than a second. Patient complains of posterior head pain, substernal chest pain with inspiration, and upper back pain with movement. She dose not unilateral weakness, numbness or tingling sensations. No wheezing to no hearing loss. Patient does not have nausea, vomiting, diarrhea, abdominal pain, fever, chills. She states that she has any increased urinary frequency, but no dysuria or burning on urination. Per EDP, pt is positive for orthostatic status in the emergency room.  ED Course: pt was found to have positive urinalysis with large amount of leukocyte, negative troponin, WBC 10.4, temperature normal, no tachycardia, stable renal function. Chest x-ray is negative for acute abnormalities. CT head is negative for acute intracranial abnormalities. CT C-spine showed no cervical spine fracture, fusion C4-5 and C5-6, prominent cervical spondylotic changes with various degrees of spinal stenosis, cord flattening and foraminal narrowing C3-4 thru C6-7 and dilated secretion filled esophagus. Pt is placed on telemetry bed for observation.  Review of Systems:   General: no fevers, chills, no changes in body weight, has  fatigue HEENT: no blurry vision, hearing changes or sore throat Pulm: no dyspnea, coughing, wheezing CV: has chest pain, no palpitations Abd: no nausea, vomiting, abdominal pain, diarrhea, constipation GU: no dysuria, burning on urination, increased urinary frequency, hematuria  Ext: no leg edema Neuro: no unilateral weakness, numbness, or tingling, no vision change or hearing loss Skin: no rash. Has hematoma in occipital area MSK: No muscle spasm, no deformity, no limitation of range of movement in spin Heme: No easy bruising.  Travel history: No recent long distant travel.  Allergy:  Allergies  Allergen Reactions  . Septra [Bactrim] Swelling  . Sulfa Antibiotics Swelling    Past Medical History  Diagnosis Date  . Hypertension   . Diverticulosis of colon   . Glaucoma   . Atrophic gastritis   . GERD (gastroesophageal reflux disease)   . Vitamin B 12 deficiency   . Pernicious anemia   . Lactose intolerance   . Cancer of left breast ([place]) 1989  . Headache     "awful headaches when I was coming up in my teens"  . Arthritis     "knees, shoulders" (12/29/2015)    Past Surgical History  Procedure Laterality Date  . Mastectomy Left 1989  . Esophageal manometry N/A 10/27/2012    Procedure: ESOPHAGEAL MANOMETRY (EM);  Surgeon: Ladene Artist, MD;  Location: [facility];  Service: Endoscopy;  Laterality: N/A;  . Esophagogastroduodenoscopy N/A 07/09/2013    Procedure: ESOPHAGOGASTRODUODENOSCOPY (EGD);  Surgeon: Ladene Artist, MD;  Location: [facility];  Service: Endoscopy;  Laterality: N/A;  . Botox injection N/A 07/09/2013    Procedure: BOTOX INJECTION;  Surgeon: Ladene Artist, MD;  Location: [facility];  Service: Endoscopy;  Laterality: N/A;  . Esophagogastroduodenoscopy N/A 10/07/2013    Procedure: ESOPHAGOGASTRODUODENOSCOPY (EGD);  Surgeon: Ladene Artist, MD;  Location: [facility];  Service: Endoscopy;  Laterality: N/A;  wtih botox  . Esophagogastroduodenoscopy (egd)  with propofol N/A 06/28/2014    Procedure: ESOPHAGOGASTRODUODENOSCOPY (EGD) WITH PROPOFOL;  Surgeon: Ladene Artist, MD;  Location: [facility];  Service: Endoscopy;  Laterality: N/A;  . Botox injection N/A 06/28/2014    Procedure: BOTOX INJECTION;  Surgeon: Ladene Artist, MD;  Location: [facility];  Service: Endoscopy;  Laterality: N/A;  . Balloon dilation N/A 06/28/2014    Procedure: BALLOON DILATION;  Surgeon: Ladene Artist, MD;  Location: [facility];  Service: Endoscopy;  Laterality: N/A;  . Cataract extraction w/ intraocular lens  implant, bilateral Bilateral 08-2015-10/2015    Social History:  reports that she has never smoked. She has never used smokeless tobacco. She reports that she does not drink alcohol or use illicit drugs.  Family History:  Family History  Problem Relation Age of Onset  . Hypertension Mother     family hx  . Lung cancer Father     family hx  . Stroke      family hx  . Heart disease Sister     family hx     Prior to Admission medications   Medication Sig Start Date End Date Taking? Authorizing Provider  acetaminophen (TYLENOL) 500 MG tablet Take 500 mg by mouth every 6 (six) hours as needed for mild pain or moderate pain.   Yes Historical Provider, MD  alum & mag hydroxide-simeth (MYLANTA) 200-200-20 MG/5ML suspension Take 15 mLs by mouth every 6 (six) hours as needed for indigestion or heartburn.   Yes Historical Provider, MD  Ascorbic Acid (VITAMIN C PO) Take 1 tablet by mouth every morning.   Yes Historical Provider, MD  CRANBERRY PO Take 1 tablet by mouth every morning.   Yes Historical Provider, MD  Cyanocobalamin (VITAMIN B-12 PO) Take 1 tablet by mouth daily.   Yes Historical Provider, MD  dorzolamide (TRUSOPT) 2 % ophthalmic solution Instill 1 drop into both eyes twice a day 11/09/15  Yes Historical Provider, MD  ferrous gluconate (FERGON) 325 MG tablet Take 325 mg by mouth daily with breakfast.     Yes Historical Provider, MD  latanoprost  (XALATAN) 0.005 % ophthalmic solution Place 1 drop into both eyes at bedtime.  12/03/14  Yes Historical Provider, MD  LUTEIN PO Take 1 capsule by mouth every morning.   Yes Historical Provider, MD  mirtazapine (REMERON SOLTAB) 15 MG disintegrating tablet Take 1 tablet (15 mg total) by mouth at bedtime. Patient taking differently: Take 15 mg by mouth at bedtime as needed.  05/04/14  Yes Dorena Cookey, MD  Multiple Vitamins-Minerals (ONE-A-DAY WOMENS 50 PLUS PO) Take 1 tablet by mouth every morning.   Yes Historical Provider, MD  timolol (TIMOPTIC) 0.5 % ophthalmic solution INSTILL 1 DROP INTO BOTH EYES IN THE MORNING 11/09/15  Yes Historical Provider, MD  vitamin E 400 UNIT capsule Take 400 Units by mouth daily.   Yes Historical Provider, MD  amLODipine (NORVASC) 5 MG tablet Take 1 tablet (5 mg total) by mouth daily. Patient not taking: Reported on 01/06/2015 11/06/14   Kelvin Cellar, MD  isosorbide mononitrate (IMDUR) 30 MG 24 hr tablet Take 0.5 tablets (15 mg total) by mouth daily. Patient not taking: Reported on 01/06/2015 11/06/14   Kelvin Cellar, MD  lansoprazole (PREVACID SOLUTAB) 30 MG disintegrating tablet Take 1 tablet (30 mg  total) by mouth daily. Patient not taking: Reported on 12/29/2015 06/14/14   Vita Barley Hvozdovic, PA-C    Physical Exam: Filed Vitals:   12/29/15 1730 12/29/15 1800 12/29/15 2000 12/29/15 2104  BP: 105/48 159/78 130/76 132/70  Pulse:  80 93 86  Temp:    98.7 F (37.1 C)  TempSrc:      Resp: 13 23 20 20   SpO2:  98% 100% 94%   General: Not in acute distress HEENT:       Eyes: PERRL, EOMI, no scleral icterus.       ENT: No discharge from the ears and nose, no pharynx injection, no tonsillar enlargement.        Neck: No JVD, no bruit, no mass felt. Heme: No neck lymph node enlargement. Cardiac: S1/S2, RRR, No murmurs, No gallops or rubs. Pulm: No rales, wheezing, rhonchi or rubs. Abd: Soft, nondistended, nontender, no rebound pain, no organomegaly, BS  present. GU: No hematuria Ext: No pitting leg edema bilaterally. 2+DP/PT pulse bilaterally. Musculoskeletal: No joint deformities, No joint redness or warmth, no limitation of ROM in spin. Skin: No rashes. Small posterior hematoma noted.  Neuro: Alert, oriented X3, cranial nerves II-XII grossly intact, moves all extremities normally. Muscle strength 5/5 in all extremities, sensation to light touch intact. Brachial reflex 2+ bilaterally. Negative Babinski's sign. Psych: Patient is not psychotic, no suicidal or hemocidal ideation.  Labs on Admission: I have personally reviewed following labs and imaging studies  CBC:  Recent Labs Lab 12/29/15 1331  WBC 10.4  HGB 11.3*  HCT 35.8*  MCV 93.5  PLT 0000000   Basic Metabolic Panel:  Recent Labs Lab 12/29/15 1331  NA 142  K 4.0  CL 111  CO2 25  GLUCOSE 97  BUN 19  CREATININE 1.16*  CALCIUM 9.0   GFR: CrCl cannot be calculated (Unknown ideal weight.). Liver Function Tests: No results for input(s): AST, ALT, ALKPHOS, BILITOT, PROT, ALBUMIN in the last 168 hours. No results for input(s): LIPASE, AMYLASE in the last 168 hours. No results for input(s): AMMONIA in the last 168 hours. Coagulation Profile: No results for input(s): INR, PROTIME in the last 168 hours. Cardiac Enzymes: No results for input(s): CKTOTAL, CKMB, CKMBINDEX, TROPONINI in the last 168 hours. BNP (last 3 results) No results for input(s): PROBNP in the last 8760 hours. HbA1C: No results for input(s): HGBA1C in the last 72 hours. CBG: No results for input(s): GLUCAP in the last 168 hours. Lipid Profile: No results for input(s): CHOL, HDL, LDLCALC, TRIG, CHOLHDL, LDLDIRECT in the last 72 hours. Thyroid Function Tests: No results for input(s): TSH, T4TOTAL, FREET4, T3FREE, THYROIDAB in the last 72 hours. Anemia Panel: No results for input(s): VITAMINB12, FOLATE, FERRITIN, TIBC, IRON, RETICCTPCT in the last 72 hours. Urine analysis:    Component Value Date/Time    COLORURINE YELLOW 12/29/2015 1715   APPEARANCEUR CLOUDY* 12/29/2015 1715   LABSPEC 1.016 12/29/2015 1715   PHURINE 6.0 12/29/2015 1715   GLUCOSEU NEGATIVE 12/29/2015 1715   HGBUR SMALL* 12/29/2015 1715   HGBUR 2+ 07/11/2010 0921   BILIRUBINUR NEGATIVE 12/29/2015 1715   BILIRUBINUR n 01/06/2015 1259   KETONESUR NEGATIVE 12/29/2015 1715   PROTEINUR NEGATIVE 12/29/2015 1715   PROTEINUR 1+ 01/06/2015 1259   UROBILINOGEN 0.2 01/06/2015 1259   UROBILINOGEN 0.2 09/28/2012 0441   NITRITE NEGATIVE 12/29/2015 1715   NITRITE pos 01/06/2015 1259   LEUKOCYTESUR LARGE* 12/29/2015 1715   Sepsis Labs: @LABRCNTIP (procalcitonin:4,lacticidven:4) )No results found for this or any previous visit (from the  past 240 hour(s)).   Radiological Exams on Admission: Dg Chest 2 View  12/29/2015  CLINICAL DATA:  82 year old female status post fall today following episode of dizziness. Thoracolumbar spine pain. Initial encounter. EXAM: CHEST  2 VIEW COMPARISON:  Thoracic and lumbar spine radiographs today which are reported separately. Chest radiographs 11/05/2014. FINDINGS: Supine AP and also lateral views of the chest. Stable cardiomegaly and mediastinal contours. Stable tortuosity of thoracic aorta. Calcified aortic atherosclerosis. Chronic postoperative changes to the left axilla and chest wall with surgical clips. No pneumothorax, pulmonary edema, pleural effusion or confluent pulmonary opacity. Chronic mild levoconvex scoliosis at the thoracolumbar junction. No definite No acute osseous abnormality identified. IMPRESSION: Stable cardiomegaly. No acute cardiopulmonary abnormality. Calcified aortic atherosclerosis. Electronically Signed   By: Genevie Ann M.D.   On: 12/29/2015 16:51   Dg Thoracic Spine 2 View  12/29/2015  CLINICAL DATA:  [place] today, dizziness, pain from upper thoracic to lower lumbar spine, chest pain with inspiration EXAM: THORACIC SPINE 2 VIEWS COMPARISON:  Chest two views 11/05/2014 FINDINGS: Twelve  pairs of ribs, last pair hypoplastic. Levoconvex thoracic scoliosis apex T10. Aortic atherosclerosis. Scattered disc space narrowing and endplate spur formation. Mild chronic anterior height loss of a mid thoracic vertebra unchanged. No definite acute fracture, subluxation or bone destruction. Upper thoracic spine and cervicothoracic junction are inadequately visualized on swimmer's view due to superimposition of the shoulders and quantum mottling artifacts. Significant costal cartilaginous calcification. IMPRESSION: Degenerative disc disease changes with levoconvex scoliosis thoracic spine. Osseous demineralization without acute bony abnormalities. Aortic atherosclerosis. Electronically Signed   By: Lavonia Dana M.D.   On: 12/29/2015 16:55   Dg Lumbar Spine Complete  12/29/2015  CLINICAL DATA:  [place] today, dizziness, pain from upper thoracic to lower lumbar spine, chest pain with inspiration EXAM: LUMBAR SPINE - COMPLETE 4+ VIEW COMPARISON:  None FINDINGS: Five non-rib-bearing lumbar vertebra. Dextroconvex lumbar scoliosis. Scattered disc space narrowing and endplate spur formation. Extensive facet degenerative changes lower lumbar spine. 9 mm of anterolisthesis at L4-L5 with 6 mm anterolisthesis L5-S1. Vertebral body heights maintained without fracture or bone destruction. RIGHT SI joint poorly visualized. Extensive atherosclerotic calcifications aorta and iliac arteries. Large calcification 14 x 12 mm projects over LEFT kidney. IMPRESSION: Marked osseous demineralization with degenerative disc and facet disease changes of the lumbar spine associated with mild dextroconvex scoliosis. Significant anterolisthesis at L4-L5 and L5-S1 as above likely due to degenerative disc and facet disease. No definite acute fracture identified. Suspected large LEFT renal calculus 4 x 12 mm. Electronically Signed   By: Lavonia Dana M.D.   On: 12/29/2015 16:52   Ct Head Wo Contrast  12/29/2015  CLINICAL DATA:  82 year old female  post fall. Hit back of head. Loss of consciousness. Neck pain. History of high blood pressure and breast cancer. Initial encounter. EXAM: CT HEAD WITHOUT CONTRAST CT CERVICAL SPINE WITHOUT CONTRAST TECHNIQUE: Multidetector CT imaging of the head and cervical spine was performed following the standard protocol without intravenous contrast. Multiplanar CT image reconstructions of the cervical spine were also generated. COMPARISON:  09/28/2012 head CT. FINDINGS: CT HEAD FINDINGS No skull fracture or intracranial hemorrhage. No CT evidence of large acute infarct. Mild global atrophy without hydrocephalus. No intracranial mass lesion noted on this unenhanced exam. Vascular calcifications. No acute orbital abnormality. Mastoid air cells, middle ear cavities and visualized paranasal sinuses are clear. CT CERVICAL SPINE FINDINGS No cervical spine fracture noted. Curvature of the cervical and upper thoracic spine. No abnormal prevertebral soft tissue swelling. Fusion C4-5  and C5-6. Prominent cervical spondylotic changes with various degrees of spinal stenosis, cord flattening and foraminal narrowing C3-4 thru C6-7. Dilated secretion filled esophagus. Carotid bifurcation calcifications. IMPRESSION: CT HEAD No skull fracture or intracranial hemorrhage. Mild global atrophy. CT CERVICAL SPINE No cervical spine fracture noted. Curvature of the cervical and upper thoracic spine. Fusion C4-5 and C5-6. Prominent cervical spondylotic changes with various degrees of spinal stenosis, cord flattening and foraminal narrowing C3-4 thru C6-7. Dilated secretion filled esophagus. Electronically Signed   By: Genia Del M.D.   On: 12/29/2015 17:19   Ct Cervical Spine Wo Contrast  12/29/2015  CLINICAL DATA:  82 year old female post fall. Hit back of head. Loss of consciousness. Neck pain. History of high blood pressure and breast cancer. Initial encounter. EXAM: CT HEAD WITHOUT CONTRAST CT CERVICAL SPINE WITHOUT CONTRAST TECHNIQUE:  Multidetector CT imaging of the head and cervical spine was performed following the standard protocol without intravenous contrast. Multiplanar CT image reconstructions of the cervical spine were also generated. COMPARISON:  09/28/2012 head CT. FINDINGS: CT HEAD FINDINGS No skull fracture or intracranial hemorrhage. No CT evidence of large acute infarct. Mild global atrophy without hydrocephalus. No intracranial mass lesion noted on this unenhanced exam. Vascular calcifications. No acute orbital abnormality. Mastoid air cells, middle ear cavities and visualized paranasal sinuses are clear. CT CERVICAL SPINE FINDINGS No cervical spine fracture noted. Curvature of the cervical and upper thoracic spine. No abnormal prevertebral soft tissue swelling. Fusion C4-5 and C5-6. Prominent cervical spondylotic changes with various degrees of spinal stenosis, cord flattening and foraminal narrowing C3-4 thru C6-7. Dilated secretion filled esophagus. Carotid bifurcation calcifications. IMPRESSION: CT HEAD No skull fracture or intracranial hemorrhage. Mild global atrophy. CT CERVICAL SPINE No cervical spine fracture noted. Curvature of the cervical and upper thoracic spine. Fusion C4-5 and C5-6. Prominent cervical spondylotic changes with various degrees of spinal stenosis, cord flattening and foraminal narrowing C3-4 thru C6-7. Dilated secretion filled esophagus. Electronically Signed   By: Genia Del M.D.   On: 12/29/2015 17:19     EKG: Independently reviewed. Sinus rhythm, QTC 414, anteroseptal infarction pattern.  Assessment/Plan Principal Problem:   UTI (lower urinary tract infection) Active Problems:   ANEMIA, B12 DEFICIENCY   Glaucoma   Dysphagia, pharyngoesophageal phase   CKD (chronic kidney disease), stage III   Dizziness   Fall   UTI (lower urinary tract infection): Patient has positive urinalysis with large amount of leukocyte. She is symptomatic with increased urinary frequency, consistency with  UTI. Patient is not septic on admission. Hemodynamically stable.  - Place on telemetry bed for obs - Ceftriaxone by IV - Follow up results of urine and blood cx and amend antibiotic regimen if needed per sensitivity results - IVF: 500 cc x 2 and then 75 cc/h  ANEMIA, B12 DEFICIENCY: Hemoglobin 11.3. -continue Vitamin V12 -also on iron supplements.  Dysphagia, pharyngoesophageal phase: -will get swallowing screen  CKD (chronic kidney disease), stage III: Renal function stable. Baseline creatinine 1.1-1.2, her creatinine is 1.16. -Follow-up renal function and BMP  Dizziness and fell: Most likely due to orthostatic status. UTI may have also contributed. Patient is positive orthostatic status per EDP. Patient does not have unilateral weakness or numbness. No focal neurologic findings, less likely to have TIA/stroke.  -IV fluid as above -treat UTI as above -PT/OT  HTN: Patient was on amlodipine before, currently not taking amlodipine. -IV hydralazine when necessary  DVT ppx: SCD Code Status: Full code Family Communication: None at bed side.   Disposition Plan:  Anticipate discharge back to previous home environment Consults called:   Admission status: Obs / tele   Date of Service 12/29/2015    Ivor Costa [facility] Pager [phone]  If 7PM-7AM, please contact night-coverage [email or web address] Password [email or web address] 12/29/2015, 11:46 PM

## 2015-12-29 NOTE — ED Notes (Signed)
Admitting MD at bedside.

## 2015-12-29 NOTE — ED Notes (Signed)
Attempted to call report

## 2015-12-30 DIAGNOSIS — N390 Urinary tract infection, site not specified: Secondary | ICD-10-CM | POA: Diagnosis not present

## 2015-12-30 DIAGNOSIS — N183 Chronic kidney disease, stage 3 (moderate): Secondary | ICD-10-CM

## 2015-12-30 DIAGNOSIS — R1314 Dysphagia, pharyngoesophageal phase: Secondary | ICD-10-CM

## 2015-12-30 DIAGNOSIS — W19XXXD Unspecified fall, subsequent encounter: Secondary | ICD-10-CM

## 2015-12-30 DIAGNOSIS — R42 Dizziness and giddiness: Secondary | ICD-10-CM

## 2015-12-30 LAB — CBC
HCT: 29 % — ABNORMAL LOW (ref 36.0–46.0)
Hemoglobin: 9.5 g/dL — ABNORMAL LOW (ref 12.0–15.0)
MCH: 30.1 pg (ref 26.0–34.0)
MCHC: 32.8 g/dL (ref 30.0–36.0)
MCV: 91.8 fL (ref 78.0–100.0)
PLATELETS: 166 10*3/uL (ref 150–400)
RBC: 3.16 MIL/uL — ABNORMAL LOW (ref 3.87–5.11)
RDW: 14.9 % (ref 11.5–15.5)
WBC: 6.2 10*3/uL (ref 4.0–10.5)

## 2015-12-30 LAB — BASIC METABOLIC PANEL
ANION GAP: 5 (ref 5–15)
BUN: 15 mg/dL (ref 6–20)
CO2: 23 mmol/L (ref 22–32)
Calcium: 8.1 mg/dL — ABNORMAL LOW (ref 8.9–10.3)
Chloride: 114 mmol/L — ABNORMAL HIGH (ref 101–111)
Creatinine, Ser: 0.99 mg/dL (ref 0.44–1.00)
GFR calc non Af Amer: 49 mL/min — ABNORMAL LOW (ref >60)
GFR, EST AFRICAN AMERICAN: 56 mL/min — AB (ref >60)
Glucose, Bld: 89 mg/dL (ref 65–99)
POTASSIUM: 3.5 mmol/L (ref 3.5–5.1)
SODIUM: 142 mmol/L (ref 135–145)

## 2015-12-30 LAB — GLUCOSE, CAPILLARY: Glucose-Capillary: 86 mg/dL (ref 65–99)

## 2015-12-30 NOTE — Progress Notes (Signed)
[facility]                                                                              Patient Demographics  Veronica Phillips, is a 82 y.o. female, DOB - 1922/12/29, [id number]  Admit date - 12/29/2015   Admitting Physician Veronica Costa, MD  Outpatient Primary MD for the patient is Veronica Peng, NP  Outpatient specialists:   LOS -   days    Chief Complaint  Patient presents with  . Fall  . Dizziness       Brief summary   Patient is a 82 year old female with hypertension, GERD, depression, glaucoma, achalasia, pernicious anemia, atrophic gastritis, CKD-III, breast cancer ( s/p of mastectomy), who presented with dizziness, fall and increased urinary frequency. Pt had fall when she was standing in her kitchen and felt lightheaded today. She fell on her windowsill on her posterior head. Family reported possible loss of consciousness for less than a second. Patient complained of posterior head pain, substernal chest pain with inspiration, and upper back pain with movement. She did not have unilateral weakness, numbness or tingling sensations.  She reported increased urinary frequency, but no dysuria or burning on urination. Per EDP, pt is positive for orthostatic status in the emergency room. ED Course: pt was found to have positive urinalysis with large amount of leukocyte, negative troponin, WBC 10.4, temperature normal, no tachycardia, stable renal function. Chest x-ray is negative for acute abnormalities. CT head is negative for acute intracranial abnormalities. CT C-spine showed no cervical spine fracture, fusion C4-5 and C5-6, prominent cervical spondylotic changes with various degrees of spinal stenosis, cord flattening and foraminal narrowing C3-4 thru C6-7 and dilated secretion filled esophagus. Patient was admitted for observation.   Assessment & Plan     UTI (lower urinary tract infection):  - Positive UA, follow urine culture and sensitivity, blood cultures    - Continue IV Rocephin, gentle hydration   ANEMIA, B12 DEFICIENCY: Hemoglobin 11.3. -continue Vitamin V12 -also on iron supplements.  Dysphagia, pharyngoesophageal phase: -will get swallowing screen  CKD (chronic kidney disease), stage III: Renal function stable. Baseline creatinine 1.1-1.2, her creatinine is 1.16. -Creatinine function at baseline  Dizziness and mechanical fall: Most likely due to orthostatic status and UTI. No focal neurologic findings -Continue IV antibiotics, gentle hydration, PT evaluation  HTN: Patient was on amlodipine before, currently not taking amlodipine. -IV hydralazine when necessary  Code Status: Full code DVT Prophylaxis:   SCD's Family Communication: Discussed in detail with the patient, all imaging results, lab results explained to the patient    Disposition Plan:   Time Spent in minutes  25 minutes  Procedures:  None   Consultants:   None   Antimicrobials:   IV CEFTRIAXONE 7/7 >   Medications  Scheduled Meds: . cefTRIAXone (ROCEPHIN)  IV  1 g Intravenous Q24H  . dorzolamide  1 drop Both Eyes BID  . ferrous gluconate  325 mg Oral Q breakfast  . latanoprost  1 drop Both Eyes QHS  . mirtazapine  15 mg Oral QHS  . multivitamin  1 tablet Oral Daily  . multivitamin with minerals  1 tablet  Oral Daily  . sodium chloride flush  3 mL Intravenous Q12H  . timolol  1 drop Both Eyes Daily  . vitamin B-12  100 mcg Oral Daily  . vitamin C  250 mg Oral Daily  . vitamin E  400 Units Oral Daily   Continuous Infusions: . sodium chloride 75 mL/hr at 12/29/15 2113   PRN Meds:.acetaminophen, alum & mag hydroxide-simeth, hydrALAZINE, ondansetron **OR** ondansetron (ZOFRAN) IV   Antibiotics   Anti-infectives    Start     Dose/Rate Route Frequency Ordered Stop   12/30/15 1800  cefTRIAXone (ROCEPHIN) 1 g in dextrose 5 % 50 mL IVPB     1 g 100 mL/hr over 30 Minutes Intravenous Every 24 hours 12/29/15 1954     12/29/15 1845  cefTRIAXone  (ROCEPHIN) 1 g in dextrose 5 % 50 mL IVPB     1 g 100 mL/hr over 30 Minutes Intravenous  Once 12/29/15 1833 12/29/15 1928        Subjective:   Veronica Phillips was seen and examined today. Feeling a little better today, denies any dizziness or lightheadedness. Patient denies chest pain, shortness of breath, abdominal pain, N/V/D/C, new weakness, numbess, tingling. No acute events overnight.    Objective:   Filed Vitals:   12/29/15 1800 12/29/15 2000 12/29/15 2104 12/30/15 0538  BP: 159/78 130/76 132/70 133/62  Pulse: 80 93 86 72  Temp:   98.7 F (37.1 C) 99.7 F (37.6 C)  TempSrc:      Resp: 23 20 20 20   Height:   5\' 3"  (1.6 m)   Weight:   46.6 kg (102 lb 11.8 oz)   SpO2: 98% 100% 94% 92%    Intake/Output Summary (Last 24 hours) at 12/30/15 1132 Last data filed at 12/30/15 1000  Gross per 24 hour  Intake 1998.75 ml  Output    500 ml  Net 1498.75 ml     Wt Readings from Last 3 Encounters:  12/29/15 46.6 kg (102 lb 11.8 oz)  12/08/15 47.628 kg (105 lb)  01/06/15 47.764 kg (105 lb 4.8 oz)     Exam  General: Alert and oriented x 3, NAD, frail  HEENT:    Neck: Supple, no JVD  Cardiovascular: S1 S2 auscultated, no rubs, murmurs or gallops. Regular rate and rhythm.  Respiratory: Clear to auscultation bilaterally, no wheezing, rales or rhonchi  Gastrointestinal: Soft, nontender, nondistended, + bowel sounds  Ext: no cyanosis clubbing or edema  Neuro: AAOx3, Cr N's II- XII. Strength 5/5 upper and lower extremities bilaterally  Skin: No rashes  Psych: Normal affect and demeanor, alert and oriented x3    Data Reviewed:  I have personally reviewed following labs and imaging studies  Micro Results No results found for this or any previous visit (from the past 240 hour(s)).  Radiology Reports Dg Chest 2 View  12/29/2015  CLINICAL DATA:  82 year old female status post fall today following episode of dizziness. Thoracolumbar spine pain. Initial encounter. EXAM:  CHEST  2 VIEW COMPARISON:  Thoracic and lumbar spine radiographs today which are reported separately. Chest radiographs 11/05/2014. FINDINGS: Supine AP and also lateral views of the chest. Stable cardiomegaly and mediastinal contours. Stable tortuosity of thoracic aorta. Calcified aortic atherosclerosis. Chronic postoperative changes to the left axilla and chest wall with surgical clips. No pneumothorax, pulmonary edema, pleural effusion or confluent pulmonary opacity. Chronic mild levoconvex scoliosis at the thoracolumbar junction. No definite No acute osseous abnormality identified. IMPRESSION: Stable cardiomegaly. No acute cardiopulmonary abnormality. Calcified aortic atherosclerosis.  Electronically Signed   By: Genevie Ann M.D.   On: 12/29/2015 16:51   Dg Thoracic Spine 2 View  12/29/2015  CLINICAL DATA:  [place] today, dizziness, pain from upper thoracic to lower lumbar spine, chest pain with inspiration EXAM: THORACIC SPINE 2 VIEWS COMPARISON:  Chest two views 11/05/2014 FINDINGS: Twelve pairs of ribs, last pair hypoplastic. Levoconvex thoracic scoliosis apex T10. Aortic atherosclerosis. Scattered disc space narrowing and endplate spur formation. Mild chronic anterior height loss of a mid thoracic vertebra unchanged. No definite acute fracture, subluxation or bone destruction. Upper thoracic spine and cervicothoracic junction are inadequately visualized on swimmer's view due to superimposition of the shoulders and quantum mottling artifacts. Significant costal cartilaginous calcification. IMPRESSION: Degenerative disc disease changes with levoconvex scoliosis thoracic spine. Osseous demineralization without acute bony abnormalities. Aortic atherosclerosis. Electronically Signed   By: Lavonia Dana M.D.   On: 12/29/2015 16:55   Dg Lumbar Spine Complete  12/29/2015  CLINICAL DATA:  [place] today, dizziness, pain from upper thoracic to lower lumbar spine, chest pain with inspiration EXAM: LUMBAR SPINE - COMPLETE 4+ VIEW  COMPARISON:  None FINDINGS: Five non-rib-bearing lumbar vertebra. Dextroconvex lumbar scoliosis. Scattered disc space narrowing and endplate spur formation. Extensive facet degenerative changes lower lumbar spine. 9 mm of anterolisthesis at L4-L5 with 6 mm anterolisthesis L5-S1. Vertebral body heights maintained without fracture or bone destruction. RIGHT SI joint poorly visualized. Extensive atherosclerotic calcifications aorta and iliac arteries. Large calcification 14 x 12 mm projects over LEFT kidney. IMPRESSION: Marked osseous demineralization with degenerative disc and facet disease changes of the lumbar spine associated with mild dextroconvex scoliosis. Significant anterolisthesis at L4-L5 and L5-S1 as above likely due to degenerative disc and facet disease. No definite acute fracture identified. Suspected large LEFT renal calculus 4 x 12 mm. Electronically Signed   By: Lavonia Dana M.D.   On: 12/29/2015 16:52   Ct Head Wo Contrast  12/29/2015  CLINICAL DATA:  82 year old female post fall. Hit back of head. Loss of consciousness. Neck pain. History of high blood pressure and breast cancer. Initial encounter. EXAM: CT HEAD WITHOUT CONTRAST CT CERVICAL SPINE WITHOUT CONTRAST TECHNIQUE: Multidetector CT imaging of the head and cervical spine was performed following the standard protocol without intravenous contrast. Multiplanar CT image reconstructions of the cervical spine were also generated. COMPARISON:  09/28/2012 head CT. FINDINGS: CT HEAD FINDINGS No skull fracture or intracranial hemorrhage. No CT evidence of large acute infarct. Mild global atrophy without hydrocephalus. No intracranial mass lesion noted on this unenhanced exam. Vascular calcifications. No acute orbital abnormality. Mastoid air cells, middle ear cavities and visualized paranasal sinuses are clear. CT CERVICAL SPINE FINDINGS No cervical spine fracture noted. Curvature of the cervical and upper thoracic spine. No abnormal prevertebral  soft tissue swelling. Fusion C4-5 and C5-6. Prominent cervical spondylotic changes with various degrees of spinal stenosis, cord flattening and foraminal narrowing C3-4 thru C6-7. Dilated secretion filled esophagus. Carotid bifurcation calcifications. IMPRESSION: CT HEAD No skull fracture or intracranial hemorrhage. Mild global atrophy. CT CERVICAL SPINE No cervical spine fracture noted. Curvature of the cervical and upper thoracic spine. Fusion C4-5 and C5-6. Prominent cervical spondylotic changes with various degrees of spinal stenosis, cord flattening and foraminal narrowing C3-4 thru C6-7. Dilated secretion filled esophagus. Electronically Signed   By: Genia Del M.D.   On: 12/29/2015 17:19   Ct Cervical Spine Wo Contrast  12/29/2015  CLINICAL DATA:  82 year old female post fall. Hit back of head. Loss of consciousness. Neck pain. History of high blood  pressure and breast cancer. Initial encounter. EXAM: CT HEAD WITHOUT CONTRAST CT CERVICAL SPINE WITHOUT CONTRAST TECHNIQUE: Multidetector CT imaging of the head and cervical spine was performed following the standard protocol without intravenous contrast. Multiplanar CT image reconstructions of the cervical spine were also generated. COMPARISON:  09/28/2012 head CT. FINDINGS: CT HEAD FINDINGS No skull fracture or intracranial hemorrhage. No CT evidence of large acute infarct. Mild global atrophy without hydrocephalus. No intracranial mass lesion noted on this unenhanced exam. Vascular calcifications. No acute orbital abnormality. Mastoid air cells, middle ear cavities and visualized paranasal sinuses are clear. CT CERVICAL SPINE FINDINGS No cervical spine fracture noted. Curvature of the cervical and upper thoracic spine. No abnormal prevertebral soft tissue swelling. Fusion C4-5 and C5-6. Prominent cervical spondylotic changes with various degrees of spinal stenosis, cord flattening and foraminal narrowing C3-4 thru C6-7. Dilated secretion filled esophagus.  Carotid bifurcation calcifications. IMPRESSION: CT HEAD No skull fracture or intracranial hemorrhage. Mild global atrophy. CT CERVICAL SPINE No cervical spine fracture noted. Curvature of the cervical and upper thoracic spine. Fusion C4-5 and C5-6. Prominent cervical spondylotic changes with various degrees of spinal stenosis, cord flattening and foraminal narrowing C3-4 thru C6-7. Dilated secretion filled esophagus. Electronically Signed   By: Genia Del M.D.   On: 12/29/2015 17:19    Lab Data:  CBC:  Recent Labs Lab 12/29/15 1331 12/30/15 0500  WBC 10.4 6.2  HGB 11.3* 9.5*  HCT 35.8* 29.0*  MCV 93.5 91.8  PLT 193 [id number]   Basic Metabolic Panel:  Recent Labs Lab 12/29/15 1331 12/30/15 0500  NA 142 142  K 4.0 3.5  CL 111 114*  CO2 25 23  GLUCOSE 97 89  BUN 19 15  CREATININE 1.16* 0.99  CALCIUM 9.0 8.1*   GFR: Estimated Creatinine Clearance: 27.8 mL/min (by C-G formula based on Cr of 0.99). Liver Function Tests: No results for input(s): AST, ALT, ALKPHOS, BILITOT, PROT, ALBUMIN in the last 168 hours. No results for input(s): LIPASE, AMYLASE in the last 168 hours. No results for input(s): AMMONIA in the last 168 hours. Coagulation Profile: No results for input(s): INR, PROTIME in the last 168 hours. Cardiac Enzymes: No results for input(s): CKTOTAL, CKMB, CKMBINDEX, TROPONINI in the last 168 hours. BNP (last 3 results) No results for input(s): PROBNP in the last 8760 hours. HbA1C: No results for input(s): HGBA1C in the last 72 hours. CBG:  Recent Labs Lab 12/30/15 0804  GLUCAP 86   Lipid Profile: No results for input(s): CHOL, HDL, LDLCALC, TRIG, CHOLHDL, LDLDIRECT in the last 72 hours. Thyroid Function Tests: No results for input(s): TSH, T4TOTAL, FREET4, T3FREE, THYROIDAB in the last 72 hours. Anemia Panel: No results for input(s): VITAMINB12, FOLATE, FERRITIN, TIBC, IRON, RETICCTPCT in the last 72 hours. Urine analysis:    Component Value Date/Time    COLORURINE YELLOW 12/29/2015 1715   APPEARANCEUR CLOUDY* 12/29/2015 1715   LABSPEC 1.016 12/29/2015 1715   PHURINE 6.0 12/29/2015 1715   GLUCOSEU NEGATIVE 12/29/2015 1715   HGBUR SMALL* 12/29/2015 1715   HGBUR 2+ 07/11/2010 0921   BILIRUBINUR NEGATIVE 12/29/2015 1715   BILIRUBINUR n 01/06/2015 1259   KETONESUR NEGATIVE 12/29/2015 1715   PROTEINUR NEGATIVE 12/29/2015 1715   PROTEINUR 1+ 01/06/2015 1259   UROBILINOGEN 0.2 01/06/2015 1259   UROBILINOGEN 0.2 09/28/2012 0441   NITRITE NEGATIVE 12/29/2015 1715   NITRITE pos 01/06/2015 [place]* 12/29/2015 [place] M.D. [facility] 12/30/2015, 11:32 AM  Pager: [phone] Between 7am to  7pm - call Pager - [phone]  After 7pm go to [email or web address] - password [email or web address]  Call night coverage person covering after 7pm

## 2015-12-30 NOTE — Care Management Obs Status (Signed)
[place] NOTIFICATION   Patient Details  Name: Veronica Phillips MRN: [id number] Date of Birth: 1923/06/05   Medicare Observation Status Notification Given:  Yes    Carles Collet, RN 12/30/2015, 2:02 PM

## 2015-12-30 NOTE — Progress Notes (Signed)
Admitted pt.from ED,alert ,oriented x 4,no acute distress noted.Vital signs taken & recorded.IV in place on rt.FA.with dsd d/i ,no redness or swelling noted.Oriented pt.to the room ,call bell & information packet given to pt.Admission inpt.armband ID verified w/ pt.& applied.Fall assessment complete.w/ pt.& able to verbalize understanding of risk associated with falls & verbalized understanding to call the nurse before getting out of bed.Call light w/in reach .Skin dry & intact .Will continue to monitor pt.

## 2015-12-30 NOTE — Evaluation (Signed)
Physical Therapy Evaluation Patient Details Name: Veronica Phillips MRN: [id number] DOB: [birth year] Today's Date: 12/30/2015   History of Present Illness  82 y.o. female with medical history significant of hypertension, GERD, depression, glaucoma, achalasia, pernicious anemia, atrophic gastritis, CKD-III, breast cancer ( s/p of mastectomy), who presents with dizziness, fall and increased urinary frequency. CT on 7/6 negative.   Clinical Impression  Patient presents with generalized weakness, deconditioning, mild dyspnea on exertion and balance deficits impacting mobility. No dizziness during mobility today. PTA, pt using SPC for mobility but required use of RW for support today. Pt plans to discharge home with daughter. Will plan for stair training prior to return home. Will follow acutely to maximize independence and mobility prior to return home.     Follow Up Recommendations Home health PT;Supervision for mobility/OOB    Equipment Recommendations  None recommended by PT    Recommendations for Other Services       Precautions / Restrictions Precautions Precautions: Fall Restrictions Weight Bearing Restrictions: No      Mobility  Bed Mobility Overal bed mobility: Modified Independent             General bed mobility comments: up in chair upon PT arrival.   Transfers Overall transfer level: Needs assistance Equipment used: Rolling walker (2 wheeled) Transfers: Sit to/from Stand Sit to Stand: Min guard Stand pivot transfers: Min guard       General transfer comment: Min guard for safety, no physical assist required.  Ambulation/Gait Ambulation/Gait assistance: Min assist Ambulation Distance (Feet): 150 Feet Assistive device: Rolling walker (2 wheeled) Gait Pattern/deviations: Step-through pattern;Decreased stride length;Trunk flexed Gait velocity: decreased Gait velocity interpretation: <1.8 ft/sec, indicative of risk for recurrent falls General Gait Details: Slow,  mostly steady gait however some instances of posterior lean during swing phase but able to recover. DOE.   Stairs            Wheelchair Mobility    Modified Rankin (Stroke Patients Only)       Balance Overall balance assessment: Needs assistance Sitting-balance support: Feet supported;No upper extremity supported Sitting balance-Leahy Scale: Good     Standing balance support: During functional activity Standing balance-Leahy Scale: Fair Standing balance comment: Able to stand at side of bed and adjust hospital gown.                             Pertinent Vitals/Pain Pain Assessment: No/denies pain    Home Living Family/patient expects to be discharged to:: Private residence Living Arrangements: Children (with daughter) Available Help at Discharge: Family;Available 24 hours/day Type of Home: House Home Access: Stairs to enter   [employer] of Steps: 3 Home Layout: Two level;Able to live on main level with bedroom/bathroom Home Equipment: Kasandra Knudsen - single point;Walker - 2 wheels Additional Comments: Planning to go to daughters house upon d/c, about info reflects daughters house    Prior Function Level of Independence: Independent with assistive device(s)         Comments: Kasandra Knudsen for mobility. Reports she only takes showers when daughter is home. Reports no falls in 3 years.      Hand Dominance   Dominant Hand: Right    Extremity/Trunk Assessment   Upper Extremity Assessment: Defer to OT evaluation           Lower Extremity Assessment: Generalized weakness      Cervical / Trunk Assessment: Kyphotic  Communication   Communication: No difficulties  Cognition Arousal/Alertness: Awake/alert  Behavior During Therapy: WFL for tasks assessed/performed Overall Cognitive Status: Within Functional Limits for tasks assessed                      General Comments General comments (skin integrity, edema, etc.): Vitals stable  throughout. No dizziness with mobility today.    Exercises        Assessment/Plan    PT Assessment Patient needs continued PT services  PT Diagnosis Difficulty walking;Generalized weakness   PT Problem List Decreased strength;Cardiopulmonary status limiting activity;Decreased activity tolerance;Decreased balance;Decreased mobility  PT Treatment Interventions Balance training;Gait training;Stair training;Functional mobility training;Therapeutic activities;Therapeutic exercise;Patient/family education   PT Goals (Current goals can be found in the Care Plan section) Acute Rehab PT Goals Patient Stated Goal: to go home PT Goal Formulation: With patient Time For Goal Achievement: 01/13/16 Potential to Achieve Goals: Good    Frequency Min 3X/week   Barriers to discharge        Co-evaluation               End of Session Equipment Utilized During Treatment: Gait belt Activity Tolerance: Patient tolerated treatment well Patient left: in chair;with call bell/phone within reach;with chair alarm set Nurse Communication: Mobility status    Functional Assessment Tool Used: clinical judgment Functional Limitation: Mobility: Walking and moving around Mobility: Walking and Moving Around Current Status [phone]): At least 20 percent but less than 40 percent impaired, limited or restricted Mobility: Walking and Moving Around Goal Status [phone]): At least 1 percent but less than 20 percent impaired, limited or restricted    Time: 1139-1154 PT Time Calculation (min) (ACUTE ONLY): 15 min   Charges:   PT Evaluation $PT Eval Low Complexity: 1 Procedure     PT G Codes:   PT G-Codes **NOT FOR INPATIENT CLASS** Functional Assessment Tool Used: clinical judgment Functional Limitation: Mobility: Walking and moving around Mobility: Walking and Moving Around Current Status [id number]): At least 20 percent but less than 40 percent impaired, limited or restricted Mobility: Walking and Moving  Around Goal Status [phone]): At least 1 percent but less than 20 percent impaired, limited or restricted    [place] 12/30/2015, 12:22 PM Wray Kearns, [place], DPT [phone]

## 2015-12-30 NOTE — Care Management Note (Addendum)
Case Management Note  Patient Details  Name: Veronica Phillips MRN: [id number] Date of Birth: [birth year]  Subjective/Objective:                 Spoke with patient at the bedside. She states that she will be DCing to her daughter's house. She states that she recently gave up driving but has tried to remain independent. She has a cane, but agreed she would benefit from a walker. CM placed order and made referral for walker to [facility] to be delivered to room prior to DC.   Action/Plan:   Patient to DC to home with RW and declined HH.   Expected Discharge Date:                  Expected Discharge Plan:  Home/Self Care  In-House Referral:     Discharge planning Services  CM Consult  Post Acute Care Choice:  Durable Medical Equipment Choice offered to:     DME Arranged:  Walker rolling DME Agency:  [place]:    [facility] Agency:     Status of Service:  In process, will continue to follow  If discussed at Long Length of Stay Meetings, dates discussed:    Additional Comments:  Carles Collet, RN 12/30/2015, 2:02 PM

## 2015-12-30 NOTE — Evaluation (Signed)
Occupational Therapy Evaluation Patient Details Name: Veronica Phillips MRN: [id number] DOB: [birth year] Today's Date: 12/30/2015    History of Present Illness 82 y.o. female with medical history significant of hypertension, GERD, depression, glaucoma, achalasia, pernicious anemia, atrophic gastritis, CKD-III, breast cancer ( s/p of mastectomy), who presents with dizziness, fall and increased urinary frequency. CT on 7/6 negative.    Clinical Impression   Pt reports she was independent with ADLs PTA. Currently pt overall min guard assist for safety with ADLs and functional mobility. Pt able to verbalize strategies for when she feels dizzy upon sitting/standing and able to verbalize energy conservation strategies. Pt planning to d/c home with 24/7 supervision from family. Pt would benefit from continued skilled OT to address established goals.    Follow Up Recommendations  No OT follow up;Supervision/Assistance - 24 hour    Equipment Recommendations  Tub/shower seat    Recommendations for Other Services PT consult     Precautions / Restrictions Precautions Precautions: Fall Restrictions Weight Bearing Restrictions: No      Mobility Bed Mobility Overal bed mobility: Modified Independent             General bed mobility comments: HOB slightly elevated with increased time needed.  Transfers Overall transfer level: Needs assistance Equipment used: None Transfers: Sit to/from [facility] Sit to Stand: Min guard Stand pivot transfers: Min guard       General transfer comment: Min guard for safety, no physical assist required.    Balance Overall balance assessment: Needs assistance Sitting-balance support: No upper extremity supported;Feet supported Sitting balance-Leahy Scale: Good     Standing balance support: No upper extremity supported Standing balance-Leahy Scale: Fair Standing balance comment: Able to stand at side of bed and adjust hospital  gown.                            ADL Overall ADL's : Needs assistance/impaired Eating/Feeding: Independent;Sitting   Grooming: Min guard;Standing   Upper Body Bathing: Supervision/ safety;Sitting   Lower Body Bathing: Min guard;Sit to/from stand   Upper Body Dressing : Supervision/safety;Sitting   Lower Body Dressing: Min guard;Sit to/from stand   Toilet Transfer: Magazine features editor Details (indicate cue type and reason): Only able to perform stand pivot this session due to wet floor but anticipate pt would do well with mobility to bathroom for toilet transfer. Toileting- Water quality scientist and Hygiene: Min guard;Sit to/from stand       Functional mobility during ADLs: Min guard General ADL Comments: Pt able to verbally educate therapist on energy conservation strategies and strategies for dizziness upon standing.     Vision Additional Comments: Appears WFL. Reports recent catartact sx.   Perception     Praxis      Pertinent Vitals/Pain Pain Assessment: No/denies pain     Hand Dominance Right   Extremity/Trunk Assessment Upper Extremity Assessment Upper Extremity Assessment: Overall WFL for tasks assessed   Lower Extremity Assessment Lower Extremity Assessment: Defer to PT evaluation   Cervical / Trunk Assessment Cervical / Trunk Assessment: Kyphotic   Communication Communication Communication: No difficulties   Cognition Arousal/Alertness: Awake/alert Behavior During Therapy: WFL for tasks assessed/performed Overall Cognitive Status: Within Functional Limits for tasks assessed                     General Comments       Exercises       Shoulder Instructions  Home Living Family/patient expects to be discharged to:: Private residence Living Arrangements: Children Available Help at Discharge: Family;Available 24 hours/day Type of Home: House Home Access: Stairs to enter [employer] of Steps: 3   Home  Layout: Two level;Able to live on main level with bedroom/bathroom     Bathroom Shower/Tub: Tub/shower unit Shower/tub characteristics: Architectural technologist: Standard     Home Equipment: Kasandra Knudsen - single point   Additional Comments: Planning to go to daughters house upon d/c, about info reflects daughters house      Prior Functioning/Environment Level of Independence: Independent with assistive device(s)        Comments: Kasandra Knudsen for mobility. Reports she only takes showers when daughter is home.    OT Diagnosis: Generalized weakness   OT Problem List: Decreased strength;Impaired balance (sitting and/or standing)   OT Treatment/Interventions: Self-care/ADL training;Energy conservation;DME and/or AE instruction;Therapeutic activities;Patient/family education;Balance training    OT Goals(Current goals can be found in the care plan section) Acute Rehab OT Goals Patient Stated Goal: go home OT Goal Formulation: With patient Time For Goal Achievement: 01/13/16 Potential to Achieve Goals: Good ADL Goals Pt Will Perform Grooming: with modified independence;standing Pt Will Perform Upper Body Bathing: with modified independence;sitting;standing Pt Will Perform Lower Body Bathing: with modified independence;sit to/from stand Pt Will Transfer to Toilet: with modified independence;ambulating;regular height toilet Pt Will Perform Toileting - Clothing Manipulation and hygiene: with modified independence;sit to/from stand Pt Will Perform Tub/Shower Transfer: Tub transfer;with supervision;ambulating;shower seat  OT Frequency: Min 2X/week   Barriers to D/C:            Co-evaluation              End of Session Nurse Communication: Mobility status  Activity Tolerance: Patient tolerated treatment well Patient left: in chair;with call bell/phone within reach;with chair alarm set   Time: [id number] OT Time Calculation (min): 16 min Charges:  OT General Charges $OT Visit: 1  Procedure OT Evaluation $OT Eval Moderate Complexity: 1 Procedure G-Codes: OT G-codes **NOT FOR INPATIENT CLASS** Functional Assessment Tool Used: Clinical judgement Functional Limitation: Self care Self Care Current Status [id number]): At least 1 percent but less than 20 percent impaired, limited or restricted Self Care Goal Status [id number]): At least 1 percent but less than 20 percent impaired, limited or restricted   Binnie Kand M.S., OTR/L Pager: [phone]  12/30/2015, 10:33 AM

## 2015-12-30 NOTE — Progress Notes (Signed)
SLP Cancellation Note  Patient Details Name: Veronica Phillips MRN: [id number] DOB: 09-14-1922   Cancelled treatment:       Reason Eval/Treat Not Completed: Patient declined, no reason specified; pt had an EGD completed 1/16 and has not had any further dysphagia symptoms; family declined BSE   Kristyana Notte,PAT, M.S., CCC-SLP 12/30/2015, 2:39 PM

## 2015-12-31 DIAGNOSIS — R1314 Dysphagia, pharyngoesophageal phase: Secondary | ICD-10-CM | POA: Diagnosis not present

## 2015-12-31 DIAGNOSIS — N390 Urinary tract infection, site not specified: Secondary | ICD-10-CM | POA: Diagnosis not present

## 2015-12-31 DIAGNOSIS — N183 Chronic kidney disease, stage 3 (moderate): Secondary | ICD-10-CM | POA: Diagnosis not present

## 2015-12-31 DIAGNOSIS — R42 Dizziness and giddiness: Secondary | ICD-10-CM | POA: Diagnosis not present

## 2015-12-31 LAB — GLUCOSE, CAPILLARY: GLUCOSE-CAPILLARY: 90 mg/dL (ref 65–99)

## 2015-12-31 LAB — BASIC METABOLIC PANEL
Anion gap: 4 — ABNORMAL LOW (ref 5–15)
BUN: 12 mg/dL (ref 6–20)
CHLORIDE: 117 mmol/L — AB (ref 101–111)
CO2: 23 mmol/L (ref 22–32)
CREATININE: 0.96 mg/dL (ref 0.44–1.00)
Calcium: 7.8 mg/dL — ABNORMAL LOW (ref 8.9–10.3)
GFR calc Af Amer: 59 mL/min — ABNORMAL LOW (ref >60)
GFR, EST NON AFRICAN AMERICAN: 51 mL/min — AB (ref >60)
GLUCOSE: 102 mg/dL — AB (ref 65–99)
Potassium: 3.4 mmol/L — ABNORMAL LOW (ref 3.5–5.1)
SODIUM: 144 mmol/L (ref 135–145)

## 2015-12-31 MED ORDER — CIPROFLOXACIN HCL 250 MG PO TABS: 250.0000 mg | ORAL_TABLET | Freq: Two times a day (BID) | ORAL | Status: DC

## 2015-12-31 MED ORDER — POTASSIUM CHLORIDE CRYS ER 20 MEQ PO TBCR
40.0000 meq | EXTENDED_RELEASE_TABLET | Freq: Once | ORAL | Status: AC
  Administered 2015-12-31: 40 meq via ORAL
  Filled 2015-12-31: qty 2

## 2015-12-31 MED ORDER — AMLODIPINE BESYLATE 5 MG PO TABS: 5.0000 mg | ORAL_TABLET | Freq: Every day | ORAL | Status: DC

## 2015-12-31 MED ORDER — AMLODIPINE BESYLATE 5 MG PO TABS
5.0000 mg | ORAL_TABLET | Freq: Every day | ORAL | Status: DC
  Administered 2015-12-31: 5 mg via ORAL
  Filled 2015-12-31 (×2): qty 1

## 2015-12-31 NOTE — Discharge Summary (Signed)
Physician Discharge Summary   Patient ID: Veronica Phillips MRN: [id number] DOB/AGE: [birth year] 80 y.o.  Admit date: 12/29/2015 Discharge date: 12/31/2015  Primary Care Physician:  Dorothyann Peng, NP  Discharge Diagnoses:   . UTI (lower urinary tract infection) . Fall . Dehydration with orthostasis  . ANEMIA, B12 DEFICIENCY . Glaucoma . Dysphagia, pharyngoesophageal phase . CKD (chronic kidney disease), stage III   Consults: None  Recommendations for Outpatient Follow-up:  1. Home health PT, OT, RN, home health aide 2. Please repeat CBC/BMET at next visit 3. Please follow urine cultures till final   DIET: Heart healthy diet    Allergies:   Allergies  Allergen Reactions  . Septra [Bactrim] Swelling  . Sulfa Antibiotics Swelling     DISCHARGE MEDICATIONS: Current Discharge Medication List    START taking these medications   Details  ciprofloxacin (CIPRO) 250 MG tablet Take 1 tablet (250 mg total) by mouth 2 (two) times daily. X 5 days Qty: 10 tablet, Refills: 0      CONTINUE these medications which have NOT CHANGED   Details  acetaminophen (TYLENOL) 500 MG tablet Take 500 mg by mouth every 6 (six) hours as needed for mild pain or moderate pain.    alum & mag hydroxide-simeth (MYLANTA) [id number] MG/5ML suspension Take 15 mLs by mouth every 6 (six) hours as needed for indigestion or heartburn.    Ascorbic Acid (VITAMIN C PO) Take 1 tablet by mouth every morning.    CRANBERRY PO Take 1 tablet by mouth every morning.    Cyanocobalamin (VITAMIN B-12 PO) Take 1 tablet by mouth daily.    dorzolamide (TRUSOPT) 2 % ophthalmic solution Instill 1 drop into both eyes twice a day Refills: 1    ferrous gluconate (FERGON) 325 MG tablet Take 325 mg by mouth daily with breakfast.      latanoprost (XALATAN) 0.005 % ophthalmic solution Place 1 drop into both eyes at bedtime.  Refills: 0    LUTEIN PO Take 1 capsule by mouth every morning.    mirtazapine (REMERON SOLTAB) 15  MG disintegrating tablet Take 1 tablet (15 mg total) by mouth at bedtime. Qty: 100 tablet, Refills: 3   Associated Diagnoses: Achalasia    Multiple Vitamins-Minerals (ONE-A-DAY WOMENS 50 PLUS PO) Take 1 tablet by mouth every morning.    timolol (TIMOPTIC) 0.5 % ophthalmic solution INSTILL 1 DROP INTO BOTH EYES IN THE MORNING Refills: 1    vitamin E 400 UNIT capsule Take 400 Units by mouth daily.    amLODipine (NORVASC) 5 MG tablet Take 1 tablet (5 mg total) by mouth daily. Qty: 30 tablet, Refills: 2    lansoprazole (PREVACID SOLUTAB) 30 MG disintegrating tablet Take 1 tablet (30 mg total) by mouth daily. Qty: 30 tablet, Refills: 3      STOP taking these medications     isosorbide mononitrate (IMDUR) 30 MG 24 hr tablet          Brief H and P: For complete details please refer to admission H and P, but in brief Patient is a 82 year old female with hypertension, GERD, depression, glaucoma, achalasia, pernicious anemia, atrophic gastritis, CKD-III, breast cancer ( s/p of mastectomy), who presented with dizziness, fall and increased urinary frequency. Pt had fall when she was standing in her kitchen and felt lightheaded today. She fell on her windowsill on her posterior head. Family reported possible loss of consciousness for less than a second. Patient complained of posterior head pain, substernal chest pain with inspiration, and upper back  pain with movement. She did not have unilateral weakness, numbness or tingling sensations. She reported increased urinary frequency, but no dysuria or burning on urination. Per EDP, pt is positive for orthostatic status in the emergency room. ED Course: pt was found to have positive urinalysis with large amount of leukocyte, negative troponin, WBC 10.4, temperature normal, no tachycardia, stable renal function. Chest x-ray is negative for acute abnormalities. CT head is negative for acute intracranial abnormalities. CT C-spine showed no cervical spine  fracture, fusion C4-5 and C5-6, prominent cervical spondylotic changes with various degrees of spinal stenosis, cord flattening and foraminal narrowing C3-4 thru C6-7 and dilated secretion filled esophagus. Patient was admitted for observation  Hospital Course:   UTI (lower urinary tract infection):  - Positive UA, patient was placed on IV Rocephin - She will be discharged home on oral ciprofloxacin to complete full course of antibiotics  ANEMIA, B12 DEFICIENCY: Hemoglobin 11.3. -continue Vitamin V12 -also on iron supplements.  Dysphagia, pharyngoesophageal phase: - Patient has been tolerating diet without any difficulty. Speech evaluation was requested however patient had an endoscopy done in January 2016 and had no further dysphagia symptoms hence family declined swallow evaluation  CKD (chronic kidney disease), stage III: Renal function stable. Baseline creatinine 1.1-1.2, her creatinine is 1.16. -Creatinine function at baseline  Dizziness and mechanical fall: Most likely due to orthostatic status and UTI. No focal neurologic findings -Currently stable, PTOT evaluation recommended home health PT  HTN: Patient was on amlodipine before, currently not taking amlodipine. -BP not well-controlled, restarted amlodipine  Day of Discharge BP 178/99 mmHg  Pulse 76  Temp(Src) 98.8 F (37.1 C) (Oral)  Resp 20  Ht 5\' 3"  (1.6 m)  Wt 46.6 kg (102 lb 11.8 oz)  BMI 18.20 kg/m2  SpO2 93%  Physical Exam: General: Alert and awake oriented x3 not in any acute distress. HEENT: anicteric sclera, pupils reactive to light and accommodation CVS: S1-S2 clear no murmur rubs or gallops Chest: clear to auscultation bilaterally, no wheezing rales or rhonchi Abdomen: soft nontender, nondistended, normal bowel sounds Extremities: no cyanosis, clubbing or edema noted bilaterally Neuro: Cranial nerves II-XII intact, no focal neurological deficits   The results of significant diagnostics from this  hospitalization (including imaging, microbiology, ancillary and laboratory) are listed below for reference.    LAB RESULTS: Basic Metabolic Panel:  Recent Labs Lab 12/30/15 0500 12/31/15 0545  NA 142 144  K 3.5 3.4*  CL 114* 117*  CO2 23 23  GLUCOSE 89 102*  BUN 15 12  CREATININE 0.99 0.96  CALCIUM 8.1* 7.8*   Liver Function Tests: No results for input(s): AST, ALT, ALKPHOS, BILITOT, PROT, ALBUMIN in the last 168 hours. No results for input(s): LIPASE, AMYLASE in the last 168 hours. No results for input(s): AMMONIA in the last 168 hours. CBC:  Recent Labs Lab 12/29/15 1331 12/30/15 0500  WBC 10.4 6.2  HGB 11.3* 9.5*  HCT 35.8* 29.0*  MCV 93.5 91.8  PLT 193 166   Cardiac Enzymes: No results for input(s): CKTOTAL, CKMB, CKMBINDEX, TROPONINI in the last 168 hours. BNP: Invalid input(s): POCBNP CBG:  Recent Labs Lab 12/30/15 0804 12/31/15 0754  GLUCAP 86 90    Significant Diagnostic Studies:  Dg Chest 2 View  12/29/2015  CLINICAL DATA:  82 year old female status post fall today following episode of dizziness. Thoracolumbar spine pain. Initial encounter. EXAM: CHEST  2 VIEW COMPARISON:  Thoracic and lumbar spine radiographs today which are reported separately. Chest radiographs 11/05/2014. FINDINGS: Supine AP and  also lateral views of the chest. Stable cardiomegaly and mediastinal contours. Stable tortuosity of thoracic aorta. Calcified aortic atherosclerosis. Chronic postoperative changes to the left axilla and chest wall with surgical clips. No pneumothorax, pulmonary edema, pleural effusion or confluent pulmonary opacity. Chronic mild levoconvex scoliosis at the thoracolumbar junction. No definite No acute osseous abnormality identified. IMPRESSION: Stable cardiomegaly. No acute cardiopulmonary abnormality. Calcified aortic atherosclerosis. Electronically Signed   By: Genevie Ann M.D.   On: 12/29/2015 16:51   Dg Thoracic Spine 2 View  12/29/2015  CLINICAL DATA:  [place]  today, dizziness, pain from upper thoracic to lower lumbar spine, chest pain with inspiration EXAM: THORACIC SPINE 2 VIEWS COMPARISON:  Chest two views 11/05/2014 FINDINGS: Twelve pairs of ribs, last pair hypoplastic. Levoconvex thoracic scoliosis apex T10. Aortic atherosclerosis. Scattered disc space narrowing and endplate spur formation. Mild chronic anterior height loss of a mid thoracic vertebra unchanged. No definite acute fracture, subluxation or bone destruction. Upper thoracic spine and cervicothoracic junction are inadequately visualized on swimmer's view due to superimposition of the shoulders and quantum mottling artifacts. Significant costal cartilaginous calcification. IMPRESSION: Degenerative disc disease changes with levoconvex scoliosis thoracic spine. Osseous demineralization without acute bony abnormalities. Aortic atherosclerosis. Electronically Signed   By: Lavonia Dana M.D.   On: 12/29/2015 16:55   Dg Lumbar Spine Complete  12/29/2015  CLINICAL DATA:  [place] today, dizziness, pain from upper thoracic to lower lumbar spine, chest pain with inspiration EXAM: LUMBAR SPINE - COMPLETE 4+ VIEW COMPARISON:  None FINDINGS: Five non-rib-bearing lumbar vertebra. Dextroconvex lumbar scoliosis. Scattered disc space narrowing and endplate spur formation. Extensive facet degenerative changes lower lumbar spine. 9 mm of anterolisthesis at L4-L5 with 6 mm anterolisthesis L5-S1. Vertebral body heights maintained without fracture or bone destruction. RIGHT SI joint poorly visualized. Extensive atherosclerotic calcifications aorta and iliac arteries. Large calcification 14 x 12 mm projects over LEFT kidney. IMPRESSION: Marked osseous demineralization with degenerative disc and facet disease changes of the lumbar spine associated with mild dextroconvex scoliosis. Significant anterolisthesis at L4-L5 and L5-S1 as above likely due to degenerative disc and facet disease. No definite acute fracture identified. Suspected  large LEFT renal calculus 4 x 12 mm. Electronically Signed   By: Lavonia Dana M.D.   On: 12/29/2015 16:52   Ct Head Wo Contrast  12/29/2015  CLINICAL DATA:  82 year old female post fall. Hit back of head. Loss of consciousness. Neck pain. History of high blood pressure and breast cancer. Initial encounter. EXAM: CT HEAD WITHOUT CONTRAST CT CERVICAL SPINE WITHOUT CONTRAST TECHNIQUE: Multidetector CT imaging of the head and cervical spine was performed following the standard protocol without intravenous contrast. Multiplanar CT image reconstructions of the cervical spine were also generated. COMPARISON:  09/28/2012 head CT. FINDINGS: CT HEAD FINDINGS No skull fracture or intracranial hemorrhage. No CT evidence of large acute infarct. Mild global atrophy without hydrocephalus. No intracranial mass lesion noted on this unenhanced exam. Vascular calcifications. No acute orbital abnormality. Mastoid air cells, middle ear cavities and visualized paranasal sinuses are clear. CT CERVICAL SPINE FINDINGS No cervical spine fracture noted. Curvature of the cervical and upper thoracic spine. No abnormal prevertebral soft tissue swelling. Fusion C4-5 and C5-6. Prominent cervical spondylotic changes with various degrees of spinal stenosis, cord flattening and foraminal narrowing C3-4 thru C6-7. Dilated secretion filled esophagus. Carotid bifurcation calcifications. IMPRESSION: CT HEAD No skull fracture or intracranial hemorrhage. Mild global atrophy. CT CERVICAL SPINE No cervical spine fracture noted. Curvature of the cervical and upper thoracic spine. Fusion C4-5 and  C5-6. Prominent cervical spondylotic changes with various degrees of spinal stenosis, cord flattening and foraminal narrowing C3-4 thru C6-7. Dilated secretion filled esophagus. Electronically Signed   By: Genia Del M.D.   On: 12/29/2015 17:19   Ct Cervical Spine Wo Contrast  12/29/2015  CLINICAL DATA:  82 year old female post fall. Hit back of head. Loss of  consciousness. Neck pain. History of high blood pressure and breast cancer. Initial encounter. EXAM: CT HEAD WITHOUT CONTRAST CT CERVICAL SPINE WITHOUT CONTRAST TECHNIQUE: Multidetector CT imaging of the head and cervical spine was performed following the standard protocol without intravenous contrast. Multiplanar CT image reconstructions of the cervical spine were also generated. COMPARISON:  09/28/2012 head CT. FINDINGS: CT HEAD FINDINGS No skull fracture or intracranial hemorrhage. No CT evidence of large acute infarct. Mild global atrophy without hydrocephalus. No intracranial mass lesion noted on this unenhanced exam. Vascular calcifications. No acute orbital abnormality. Mastoid air cells, middle ear cavities and visualized paranasal sinuses are clear. CT CERVICAL SPINE FINDINGS No cervical spine fracture noted. Curvature of the cervical and upper thoracic spine. No abnormal prevertebral soft tissue swelling. Fusion C4-5 and C5-6. Prominent cervical spondylotic changes with various degrees of spinal stenosis, cord flattening and foraminal narrowing C3-4 thru C6-7. Dilated secretion filled esophagus. Carotid bifurcation calcifications. IMPRESSION: CT HEAD No skull fracture or intracranial hemorrhage. Mild global atrophy. CT CERVICAL SPINE No cervical spine fracture noted. Curvature of the cervical and upper thoracic spine. Fusion C4-5 and C5-6. Prominent cervical spondylotic changes with various degrees of spinal stenosis, cord flattening and foraminal narrowing C3-4 thru C6-7. Dilated secretion filled esophagus. Electronically Signed   By: Genia Del M.D.   On: 12/29/2015 17:19    2D ECHO:   Disposition and Follow-up:    DISPOSITION: Home health PT   DISCHARGE FOLLOW-UP Follow-up Information    Follow up with [facility].   Why:  Rolling walker to be delivered to room prior to [place] information:   [address] [place] [address] [phone]        Follow up with Dorothyann Peng, NP. Schedule an appointment as soon as possible for a visit in 2 weeks.   Specialty:  Family Medicine   Why:  for hospital follow-up   Contact information:   [place] [address] [phone]        Time spent on Discharge: 25 minutes  Signed:   RAI,RIPUDEEP M.D. [facility] 12/31/2015, 12:37 PM Pager: [phone]

## 2015-12-31 NOTE — Progress Notes (Signed)
Occupational Therapy Treatment Patient Details Name: Veronica Phillips MRN: [id number] DOB: 10-Oct-1922 Today's Date: 12/31/2015    History of present illness 82 y.o. female with medical history significant of hypertension, GERD, depression, glaucoma, achalasia, pernicious anemia, atrophic gastritis, CKD-III, breast cancer ( s/p of mastectomy), who presents with dizziness, fall and increased urinary frequency. CT on 7/6 negative.    OT comments  Pt. Limited this session secondary to c/o L eye pain and pressure.  RN aware.  Completed amb. In room with toileting task. Will continue to follow acutely with plans for tub/shower transfer and/or B/D next session as pt. Able.    Follow Up Recommendations  No OT follow up;Supervision/Assistance - 24 hour    Equipment Recommendations  Tub/shower seat    Recommendations for Other Services      Precautions / Restrictions Precautions Precautions: Fall       Mobility Bed Mobility Overal bed mobility: Needs Assistance Bed Mobility: Supine to Sit     Supine to sit: Supervision        Transfers Overall transfer level: Needs assistance Equipment used: Rolling walker (2 wheeled) Transfers: Sit to/from [facility] Sit to Stand: Min guard Stand pivot transfers: Min guard       General transfer comment: Min guard for safety, no physical assist required, cues for rw management. pushing rw away before sitting down, picking it up off of the ground to negotiate around obstacles    Balance                                   ADL Overall ADL's : Needs assistance/impaired                         Toilet Transfer: Min guard;Minimal assistance;Cueing for sequencing;Cueing for safety;Ambulation;Regular Toilet;RW   Toileting- Clothing Manipulation and Hygiene: Supervision/safety;Sit to/from stand       Functional mobility during ADLs: Min guard;Minimal assistance General ADL Comments: session limited  secondary to pt. c/o of severe pain/pressure behind L eye.  RN and MD aware.  noted greatest difficulty with RW management.  cues required for hand placement and safety with rw during functional mobility and stand pivot transfers      Vision                     Perception     Praxis      Cognition   Behavior During Therapy: [facility] for tasks assessed/performed Overall Cognitive Status: Within Functional Limits for tasks assessed                       Extremity/Trunk Assessment               Exercises     Shoulder Instructions       General Comments      Pertinent Vitals/ Pain       Pain Assessment: 0-10 (did not rate but c/o "severe" pain/pressure with L eye during visual tracking) Pain Location: L eye Pain Descriptors / Indicators: Pressure Pain Intervention(s): Limited activity within patient's tolerance;Monitored during session;Patient requesting pain meds-RN notified;RN gave pain meds during session  Home Living  Prior Functioning/Environment              Frequency Min 2X/week     Progress Toward Goals  OT Goals(current goals can now be found in the care plan section)  Progress towards OT goals: Progressing toward goals     Plan Discharge plan remains appropriate    Co-evaluation                 End of Session Equipment Utilized During Treatment: Gait belt;Rolling walker   Activity Tolerance Patient limited by pain   Patient Left in chair;with call bell/phone within reach   Nurse Communication Other (comment) (notified RN of pts L eye complaints)        Time: [id number] OT Time Calculation (min): 17 min  Charges: OT General Charges $OT Visit: 1 Procedure OT Treatments $Self Care/Home Management : 8-22 mins  Janice Coffin, COTA/L 12/31/2015, 10:46 AM

## 2015-12-31 NOTE — Progress Notes (Signed)
Patient discharge teaching given, including activity, diet, follow up appointment, and mediations. Patient verbalized understanding of all discharge instructions. IV access was discontinued. Vitals are stable, Skin intact except as charted in most recent assessments. Patient to be escorted out by NT, to be driven home by family.

## 2015-12-31 NOTE — Progress Notes (Addendum)
CM received call from RN requesting delivery of DM ewhich was ordered yesterday.  CM called [facility] DME rep, Germaine to please deliver the rolling walker to room so pt can discharge.  Cm spoke with pt to offer choice of home health agency.  And pt declines all HH services   NO other CM needs were communicated.

## 2016-01-01 LAB — URINE CULTURE

## 2016-01-13 ENCOUNTER — Encounter: Payer: Self-pay | Admitting: Adult Health

## 2016-01-13 ENCOUNTER — Ambulatory Visit (INDEPENDENT_AMBULATORY_CARE_PROVIDER_SITE_OTHER): Payer: Medicare Other | Admitting: Adult Health

## 2016-01-13 VITALS — BP 120/80 | HR 69 | Temp 97.8°F | Ht 63.0 in | Wt 104.6 lb

## 2016-01-13 DIAGNOSIS — I10 Essential (primary) hypertension: Secondary | ICD-10-CM | POA: Diagnosis not present

## 2016-01-13 DIAGNOSIS — R8290 Unspecified abnormal findings in urine: Secondary | ICD-10-CM

## 2016-01-13 DIAGNOSIS — Z0001 Encounter for general adult medical examination with abnormal findings: Secondary | ICD-10-CM | POA: Diagnosis not present

## 2016-01-13 DIAGNOSIS — Z0000 Encounter for general adult medical examination without abnormal findings: Secondary | ICD-10-CM

## 2016-01-13 LAB — POC URINALSYSI DIPSTICK (AUTOMATED)
BILIRUBIN UA: NEGATIVE
Glucose, UA: NEGATIVE
KETONES UA: NEGATIVE
Nitrite, UA: NEGATIVE
PH UA: 6.5
SPEC GRAV UA: 1.02
Urobilinogen, UA: 0.2

## 2016-01-13 LAB — LIPID PANEL
CHOL/HDL RATIO: 2
CHOLESTEROL: 160 mg/dL (ref 0–200)
HDL: 65.1 mg/dL (ref >39.00)
LDL CALC: 82 mg/dL (ref 0–99)
NonHDL: 95.2
TRIGLYCERIDES: 67 mg/dL (ref 0.0–149.0)
VLDL: 13.4 mg/dL (ref 0.0–40.0)

## 2016-01-13 LAB — TSH: TSH: 3.82 u[IU]/mL (ref 0.35–4.50)

## 2016-01-13 LAB — CBC WITH DIFFERENTIAL/PLATELET
BASOS ABS: 0 10*3/uL (ref 0.0–0.1)
BASOS PCT: 0.1 % (ref 0.0–3.0)
EOS ABS: 0 10*3/uL (ref 0.0–0.7)
Eosinophils Relative: 0 % (ref 0.0–5.0)
HEMATOCRIT: 31 % — AB (ref 36.0–46.0)
Hemoglobin: 10.4 g/dL — ABNORMAL LOW (ref 12.0–15.0)
Lymphocytes Relative: 21.3 % (ref 12.0–46.0)
Lymphs Abs: 1.1 10*3/uL (ref 0.7–4.0)
MCHC: 33.5 g/dL (ref 30.0–36.0)
MCV: 90.2 fl (ref 78.0–100.0)
MONO ABS: 0.5 10*3/uL (ref 0.1–1.0)
Monocytes Relative: 10.6 % (ref 3.0–12.0)
NEUTROS ABS: 3.5 10*3/uL (ref 1.4–7.7)
Neutrophils Relative %: 68 % (ref 43.0–77.0)
Platelets: 317 10*3/uL (ref 150.0–400.0)
RBC: 3.43 Mil/uL — ABNORMAL LOW (ref 3.87–5.11)
RDW: 15.9 % — AB (ref 11.5–15.5)
WBC: 5.1 10*3/uL (ref 4.0–10.5)

## 2016-01-13 LAB — HEPATIC FUNCTION PANEL
ALT: 10 U/L (ref 0–35)
AST: 14 U/L (ref 0–37)
Albumin: 3.5 g/dL (ref 3.5–5.2)
Alkaline Phosphatase: 95 U/L (ref 39–117)
BILIRUBIN DIRECT: 0.2 mg/dL (ref 0.0–0.3)
BILIRUBIN TOTAL: 0.8 mg/dL (ref 0.2–1.2)
TOTAL PROTEIN: 6.1 g/dL (ref 6.0–8.3)

## 2016-01-13 LAB — BASIC METABOLIC PANEL
BUN: 22 mg/dL (ref 6–23)
CALCIUM: 9.2 mg/dL (ref 8.4–10.5)
CHLORIDE: 109 meq/L (ref 96–112)
CO2: 29 mEq/L (ref 19–32)
CREATININE: 1.18 mg/dL (ref 0.40–1.20)
GFR: 55.26 mL/min — ABNORMAL LOW (ref >60.00)
Glucose, Bld: 114 mg/dL — ABNORMAL HIGH (ref 70–99)
Potassium: 4.4 mEq/L (ref 3.5–5.1)
Sodium: 143 mEq/L (ref 135–145)

## 2016-01-13 NOTE — Progress Notes (Signed)
Subjective:    Patient ID: Veronica Phillips, female    DOB: 06-Jan-1923, 82 y.o.   MRN: [id number]  HPI  Patient presents today for their annual wellness visit. She  has a past medical history of Hypertension; Arthritis; Breast cancer (1989); Diverticulosis of colon; Glaucoma; Atrophic gastritis; GERD (gastroesophageal reflux disease); Vitamin B 12 deficiency; Pernicious anemia; and Lactose intolerance. She is now living with her daughter but remains independent.   All immunizations and health maintenance protocols were reviewed with the patient and needed orders were placed.  Appropriate screening laboratory values were ordered for the patient including screening of hyperlipidemia, renal function and hepatic function.  Medication reconciliation,  past medical history, social history, problem list and allergies were reviewed in detail with the patient  Goals were established with regard to weight loss, exercise, and  diet in compliance with medications  End of life planning was discussed.  She was recently admitted to the hospital for two days s/p fall when she was standing in her kitchen and felt lightheaded today. She fell on her windowsill on her posterior head. Family reported possible loss of consciousness for less than a second. Patient complained of posterior head pain, substernal chest pain with inspiration, and upper back pain with movement  pt was found to have positive urinalysis with large amount of leukocyte, negative troponin, WBC 10.4, temperature normal, no tachycardia, stable renal function. Chest x-ray is negative for acute abnormalities. CT head is negative for acute intracranial abnormalities. CT C-spine showed no cervical spine fracture, fusion C4-5 and C5-6, prominent cervical spondylotic changes with various degrees of spinal stenosis, cord flattening and foraminal narrowing C3-4 thru C6-7 and dilated secretion filled esophagus. Patient was admitted for observation  Today  in the office she reports that she is feeling better but continues to be sore " every day I feel better and better." She also continues to have bouts of dizziness " they last for a second and then go away". She has not felt like she was going to have a syncopal episode since being discharged.   She was started on 5 mg Norvasc for elevated blood pressure. She has had episodes of hypotension in the past.    BP Readings from Last 3 Encounters:  01/13/16 120/80  12/31/15 131/73  12/08/15 132/68     Review of Systems  Constitutional: Negative.   HENT: Positive for rhinorrhea. Negative for sinus pressure.   Eyes: Negative.   Respiratory: Negative.   Cardiovascular: Negative.   Gastrointestinal: Negative.   Endocrine: Negative.   Genitourinary: Negative.   Musculoskeletal: Negative.   Skin: Negative.   Allergic/Immunologic: Negative.   Neurological: Negative.   Hematological: Negative.   Psychiatric/Behavioral: Negative.   All other systems reviewed and are negative.     Objective:   Physical Exam Constitutional: She is oriented to person, place, and time.  Thin, elderly female who is well nourished.  HENT:  Right Ear: Tympanic membrane normal.  Left Ear: Tympanic membrane normal.  Neck: Neck supple.  Cardiovascular: Normal rate and regular rhythm.  Pulmonary/Chest: Effort normal and breath sounds normal. She has no wheezes.  Abdominal: Soft. Bowel sounds are normal. There is no tenderness.  Lymphadenopathy:   She has no cervical adenopathy.  Neurological: She is alert and oriented to person, place, and time. She has normal strength. No sensory deficit. Coordination and gait normal.  Reflex Scores:  Patellar reflexes are 2+ on the right side and 2+ on the left side. Patient quickly moved from  a sitting to standing position when proceeding to exam table without symptom of dizziness noted  Skin: Skin is warm and dry. No rash noted.  Psychiatric: She has a normal mood and  affect. Her behavior is normal. Judgment and thought content normal.       Assessment & Plan:  1. Routine general medical examination at a health care facility  - Basic metabolic panel - CBC with Differential/Platelet - Hepatic function panel - Lipid panel - TSH  2. Nonspecific finding on examination of urine  - POCT Urinalysis Dipstick (Automated)  3. Essential hypertension - Her blood pressure is volatile. Currently on 5 mg norvasc. Advised family to check her blood pressure in the morning and if below [id number] systolic to hold Norvasc.  - Follow up with continued dizzy spells.  - Stay hydrated - Basic metabolic panel - CBC with Differential/Platelet - Hepatic function panel - Lipid panel - TSH  Dorothyann Peng, NP

## 2016-01-13 NOTE — Patient Instructions (Addendum)
It was great seeing you again.   I will follow up with you regarding your blood work.   Please let me know if the dizziness becomes worse  Health Maintenance, Female Adopting a healthy lifestyle and getting preventive care can go a long way to promote health and wellness. Talk with your health care provider about what schedule of regular examinations is right for you. This is a good chance for you to check in with your provider about disease prevention and staying healthy. In between checkups, there are plenty of things you can do on your own. Experts have done a lot of research about which lifestyle changes and preventive measures are most likely to keep you healthy. Ask your health care provider for more information. WEIGHT AND DIET  Eat a healthy diet  Be sure to include plenty of vegetables, fruits, low-fat dairy products, and lean protein.  Do not eat a lot of foods high in solid fats, added sugars, or salt.  Get regular exercise. This is one of the most important things you can do for your health.  Most adults should exercise for at least 150 minutes each week. The exercise should increase your heart rate and make you sweat (moderate-intensity exercise).  Most adults should also do strengthening exercises at least twice a week. This is in addition to the moderate-intensity exercise.  Maintain a healthy weight  Body mass index (BMI) is a measurement that can be used to identify possible weight problems. It estimates body fat based on height and weight. Your health care provider can help determine your BMI and help you achieve or maintain a healthy weight.  For females 19 years of age and older:   A BMI below 18.5 is considered underweight.  A BMI of 18.5 to 24.9 is normal.  A BMI of 25 to 29.9 is considered overweight.  A BMI of 30 and above is considered obese.  Watch levels of cholesterol and blood lipids  You should start having your blood tested for lipids and  cholesterol at 80 years of age, then have this test every 5 years.  You may need to have your cholesterol levels checked more often if:  Your lipid or cholesterol levels are high.  You are older than 80 years of age.  You are at high risk for heart disease.  CANCER SCREENING   Lung Cancer  Lung cancer screening is recommended for adults 32-28 years old who are at high risk for lung cancer because of a history of smoking.  A yearly low-dose CT scan of the lungs is recommended for people who:  Currently smoke.  Have quit within the past 15 years.  Have at least a 30-pack-year history of smoking. A pack year is smoking an average of one pack of cigarettes a day for 1 year.  Yearly screening should continue until it has been 15 years since you quit.  Yearly screening should stop if you develop a health problem that would prevent you from having lung cancer treatment.  Breast Cancer  Practice breast self-awareness. This means understanding how your breasts normally appear and feel.  It also means doing regular breast self-exams. Let your health care provider know about any changes, no matter how small.  If you are in your 20s or 30s, you should have a clinical breast exam (CBE) by a health care provider every 1-3 years as part of a regular health exam.  If you are 35 or older, have a CBE every year. Also  consider having a breast X-ray (mammogram) every year.  If you have a family history of breast cancer, talk to your health care provider about genetic screening.  If you are at high risk for breast cancer, talk to your health care provider about having an MRI and a mammogram every year.  Breast cancer gene (BRCA) assessment is recommended for women who have family members with BRCA-related cancers. BRCA-related cancers include:  Breast.  Ovarian.  Tubal.  Peritoneal cancers.  Results of the assessment will determine the need for genetic counseling and BRCA1 and BRCA2  testing. Cervical Cancer Your health care provider may recommend that you be screened regularly for cancer of the pelvic organs (ovaries, uterus, and vagina). This screening involves a pelvic examination, including checking for microscopic changes to the surface of your cervix (Pap test). You may be encouraged to have this screening done every 3 years, beginning at age 31.  For women ages 67-65, health care providers may recommend pelvic exams and Pap testing every 3 years, or they may recommend the Pap and pelvic exam, combined with testing for human papilloma virus (HPV), every 5 years. Some types of HPV increase your risk of cervical cancer. Testing for HPV may also be done on women of any age with unclear Pap test results.  Other health care providers may not recommend any screening for nonpregnant women who are considered low risk for pelvic cancer and who do not have symptoms. Ask your health care provider if a screening pelvic exam is right for you.  If you have had past treatment for cervical cancer or a condition that could lead to cancer, you need Pap tests and screening for cancer for at least 20 years after your treatment. If Pap tests have been discontinued, your risk factors (such as having a new sexual partner) need to be reassessed to determine if screening should resume. Some women have medical problems that increase the chance of getting cervical cancer. In these cases, your health care provider may recommend more frequent screening and Pap tests. Colorectal Cancer  This type of cancer can be detected and often prevented.  Routine colorectal cancer screening usually begins at 80 years of age and continues through 80 years of age.  Your health care provider may recommend screening at an earlier age if you have risk factors for colon cancer.  Your health care provider may also recommend using home test kits to check for hidden blood in the stool.  A small camera at the end of a  tube can be used to examine your colon directly (sigmoidoscopy or colonoscopy). This is done to check for the earliest forms of colorectal cancer.  Routine screening usually begins at age 25.  Direct examination of the colon should be repeated every 5-10 years through 80 years of age. However, you may need to be screened more often if early forms of precancerous polyps or small growths are found. Skin Cancer  Check your skin from head to toe regularly.  Tell your health care provider about any new moles or changes in moles, especially if there is a change in a mole's shape or color.  Also tell your health care provider if you have a mole that is larger than the size of a pencil eraser.  Always use sunscreen. Apply sunscreen liberally and repeatedly throughout the day.  Protect yourself by wearing long sleeves, pants, a wide-brimmed hat, and sunglasses whenever you are outside. HEART DISEASE, DIABETES, AND HIGH BLOOD PRESSURE   High  blood pressure causes heart disease and increases the risk of stroke. High blood pressure is more likely to develop in:  People who have blood pressure in the high end of the normal range (130-139/85-89 mm Hg).  People who are overweight or obese.  People who are African American.  If you are 30-90 years of age, have your blood pressure checked every 3-5 years. If you are 83 years of age or older, have your blood pressure checked every year. You should have your blood pressure measured twice--once when you are at a hospital or clinic, and once when you are not at a hospital or clinic. Record the average of the two measurements. To check your blood pressure when you are not at a hospital or clinic, you can use:  An automated blood pressure machine at a pharmacy.  A home blood pressure monitor.  If you are between 66 years and 43 years old, ask your health care provider if you should take aspirin to prevent strokes.  Have regular diabetes screenings. This  involves taking a blood sample to check your fasting blood sugar level.  If you are at a normal weight and have a low risk for diabetes, have this test once every three years after 80 years of age.  If you are overweight and have a high risk for diabetes, consider being tested at a younger age or more often. PREVENTING INFECTION  Hepatitis B  If you have a higher risk for hepatitis B, you should be screened for this virus. You are considered at high risk for hepatitis B if:  You were born in a country where hepatitis B is common. Ask your health care provider which countries are considered high risk.  Your parents were born in a high-risk country, and you have not been immunized against hepatitis B (hepatitis B vaccine).  You have HIV or AIDS.  You use needles to inject street drugs.  You live with someone who has hepatitis B.  You have had sex with someone who has hepatitis B.  You get hemodialysis treatment.  You take certain medicines for conditions, including cancer, organ transplantation, and autoimmune conditions. Hepatitis C  Blood testing is recommended for:  Everyone born from [birth year] through 1963.  Anyone with known risk factors for hepatitis C. Sexually transmitted infections (STIs)  You should be screened for sexually transmitted infections (STIs) including gonorrhea and chlamydia if:  You are sexually active and are younger than 80 years of age.  You are older than 80 years of age and your health care provider tells you that you are at risk for this type of infection.  Your sexual activity has changed since you were last screened and you are at an increased risk for chlamydia or gonorrhea. Ask your health care provider if you are at risk.  If you do not have HIV, but are at risk, it may be recommended that you take a prescription medicine daily to prevent HIV infection. This is called pre-exposure prophylaxis (PrEP). You are considered at risk if:  You are  sexually active and do not regularly use condoms or know the HIV status of your partner(s).  You take drugs by injection.  You are sexually active with a partner who has HIV. Talk with your health care provider about whether you are at high risk of being infected with HIV. If you choose to begin PrEP, you should first be tested for HIV. You should then be tested every 3 months for as long  as you are taking PrEP.  PREGNANCY   If you are premenopausal and you may become pregnant, ask your health care provider about preconception counseling.  If you may become pregnant, take 400 to 800 micrograms (mcg) of folic acid every day.  If you want to prevent pregnancy, talk to your health care provider about birth control (contraception). OSTEOPOROSIS AND MENOPAUSE   Osteoporosis is a disease in which the bones lose minerals and strength with aging. This can result in serious bone fractures. Your risk for osteoporosis can be identified using a bone density scan.  If you are 65 years of age or older, or if you are at risk for osteoporosis and fractures, ask your health care provider if you should be screened.  Ask your health care provider whether you should take a calcium or vitamin D supplement to lower your risk for osteoporosis.  Menopause may have certain physical symptoms and risks.  Hormone replacement therapy may reduce some of these symptoms and risks. Talk to your health care provider about whether hormone replacement therapy is right for you.  HOME CARE INSTRUCTIONS   Schedule regular health, dental, and eye exams.  Stay current with your immunizations.   Do not use any tobacco products including cigarettes, chewing tobacco, or electronic cigarettes.  If you are pregnant, do not drink alcohol.  If you are breastfeeding, limit how much and how often you drink alcohol.  Limit alcohol intake to no more than 1 drink per day for nonpregnant women. One drink equals 12 ounces of beer, 5  ounces of wine, or 1 ounces of hard liquor.  Do not use street drugs.  Do not share needles.  Ask your health care provider for help if you need support or information about quitting drugs.  Tell your health care provider if you often feel depressed.  Tell your health care provider if you have ever been abused or do not feel safe at home.   This information is not intended to replace advice given to you by your health care provider. Make sure you discuss any questions you have with your health care provider.   Document Released: 12/25/2010 Document Revised: 07/02/2014 Document Reviewed: 05/13/2013 Elsevier Interactive Patient Education 2016 [employer]  

## 2016-01-13 NOTE — Progress Notes (Signed)
Pre visit review using our clinic review tool, if applicable. No additional management support is needed unless otherwise documented below in the visit note. 

## 2016-03-06 DIAGNOSIS — H401131 Primary open-angle glaucoma, bilateral, mild stage: Secondary | ICD-10-CM | POA: Diagnosis not present

## 2016-03-19 ENCOUNTER — Other Ambulatory Visit: Payer: Self-pay | Admitting: Adult Health

## 2016-03-19 DIAGNOSIS — K220 Achalasia of cardia: Secondary | ICD-10-CM

## 2016-03-19 MED ORDER — MIRTAZAPINE 15 MG PO TBDP: 15.0000 mg | ORAL_TABLET | Freq: Every evening | ORAL | 1 refills | Status: DC | PRN

## 2016-03-19 NOTE — Telephone Encounter (Signed)
Lab sent in

## 2016-03-27 ENCOUNTER — Encounter: Payer: Self-pay | Admitting: Adult Health

## 2016-03-27 ENCOUNTER — Ambulatory Visit (INDEPENDENT_AMBULATORY_CARE_PROVIDER_SITE_OTHER): Payer: Medicare Other | Admitting: Adult Health

## 2016-03-27 VITALS — BP 90/52 | Temp 98.2°F | Ht 63.0 in | Wt 101.6 lb

## 2016-03-27 DIAGNOSIS — Z23 Encounter for immunization: Secondary | ICD-10-CM | POA: Diagnosis not present

## 2016-03-27 DIAGNOSIS — Z961 Presence of intraocular lens: Secondary | ICD-10-CM | POA: Diagnosis not present

## 2016-03-27 DIAGNOSIS — D518 Other vitamin B12 deficiency anemias: Secondary | ICD-10-CM

## 2016-03-27 DIAGNOSIS — W19XXXS Unspecified fall, sequela: Secondary | ICD-10-CM

## 2016-03-27 DIAGNOSIS — R5383 Other fatigue: Secondary | ICD-10-CM | POA: Diagnosis not present

## 2016-03-27 LAB — CBC WITH DIFFERENTIAL/PLATELET
BASOS PCT: 0.3 % (ref 0.0–3.0)
Basophils Absolute: 0 10*3/uL (ref 0.0–0.1)
EOS ABS: 0 10*3/uL (ref 0.0–0.7)
Eosinophils Relative: 0 % (ref 0.0–5.0)
HEMATOCRIT: 34.4 % — AB (ref 36.0–46.0)
HEMOGLOBIN: 11.3 g/dL — AB (ref 12.0–15.0)
LYMPHS PCT: 20.1 % (ref 12.0–46.0)
Lymphs Abs: 1.1 10*3/uL (ref 0.7–4.0)
MCHC: 32.8 g/dL (ref 30.0–36.0)
MCV: 93 fl (ref 78.0–100.0)
MONOS PCT: 5.7 % (ref 3.0–12.0)
Monocytes Absolute: 0.3 10*3/uL (ref 0.1–1.0)
Neutro Abs: 4.1 10*3/uL (ref 1.4–7.7)
Neutrophils Relative %: 73.9 % (ref 43.0–77.0)
Platelets: 151 10*3/uL (ref 150.0–400.0)
RBC: 3.7 Mil/uL — ABNORMAL LOW (ref 3.87–5.11)
RDW: 15.5 % (ref 11.5–15.5)
WBC: 5.2 10*3/uL (ref 4.0–10.5)

## 2016-03-27 LAB — POCT URINALYSIS DIPSTICK
BILIRUBIN UA: NEGATIVE
Glucose, UA: NEGATIVE
KETONES UA: NEGATIVE
Nitrite, UA: NEGATIVE
Spec Grav, UA: 1.025
Urobilinogen, UA: 1
pH, UA: 5

## 2016-03-27 LAB — BASIC METABOLIC PANEL
BUN: 25 mg/dL — AB (ref 6–23)
CHLORIDE: 109 meq/L (ref 96–112)
CO2: 28 mEq/L (ref 19–32)
CREATININE: 1.36 mg/dL — AB (ref 0.40–1.20)
Calcium: 9 mg/dL (ref 8.4–10.5)
GFR: 46.89 mL/min — ABNORMAL LOW (ref >60.00)
GLUCOSE: 93 mg/dL (ref 70–99)
POTASSIUM: 4.3 meq/L (ref 3.5–5.1)
Sodium: 144 mEq/L (ref 135–145)

## 2016-03-27 LAB — VITAMIN B12: Vitamin B-12: >1500 pg/mL — ABNORMAL HIGH (ref 211–911)

## 2016-03-27 MED ORDER — CYANOCOBALAMIN 1000 MCG/ML IJ SOLN
1000.0000 ug | Freq: Once | INTRAMUSCULAR | Status: AC
  Administered 2016-03-27: 1000 ug via INTRAMUSCULAR

## 2016-03-27 NOTE — Patient Instructions (Signed)
It was great seeing you today  I will follow up with you regarding your blood work   Someone will call you from PT to schedule your appointments  Please follow up with me in one month or sooner if needed

## 2016-03-27 NOTE — Progress Notes (Signed)
Subjective:    Patient ID: Veronica Phillips, female    DOB: 1923-05-16, 82 y.o.   MRN: [id number]  HPI  82 year old female  has a past medical history of Arthritis; Atrophic gastritis; Cancer of left breast ([place]) (1989); Diverticulosis of colon; GERD (gastroesophageal reflux disease); Glaucoma; Headache; Hypertension; Lactose intolerance; Pernicious anemia; and Vitamin B 12 deficiency.  who presents to the office today for the complaint of constant fatigue and falling. She has had these complaints since July 2017, after she had seen me for her physical. She reports that she has had two falls in that time span, in which " my legs just gave out on me." The last fall was about 2 weeks ago. She denies any injuries but does have soreness in her torso from the fall.   She believes that she started feeling this way after she stopped B12 injections.   She denies any chest pain or SOB. She does report dizziness on occasion. Her BP is low today int he office. She is not taking blood pressure medication any longer.   She denies feeling depressed. Continues to have a good appetite.   BP Readings from Last 3 Encounters:  03/27/16 (!) 90/52  01/13/16 120/80  12/31/15 131/73    Review of Systems  Respiratory: Negative.   Cardiovascular: Negative.   Gastrointestinal: Negative.   Genitourinary: Negative.   Musculoskeletal: Negative.   Neurological: Positive for dizziness, weakness and numbness. Negative for seizures, facial asymmetry, light-headedness and headaches.  Psychiatric/Behavioral: Positive for sleep disturbance.  All other systems reviewed and are negative.  Past Medical History:  Diagnosis Date  . Arthritis    "knees, shoulders" (12/29/2015)  . Atrophic gastritis   . Cancer of left breast ([place]) 1989  . Diverticulosis of colon   . GERD (gastroesophageal reflux disease)   . Glaucoma   . Headache    "awful headaches when I was coming up in my teens"  . Hypertension   . Lactose  intolerance   . Pernicious anemia   . Vitamin B 12 deficiency     Social History   Social History  . Marital status: Widowed    Spouse name: N/A  . Number of children: N/A  . Years of education: N/A   Occupational History  . Not on file.   Social History Main Topics  . Smoking status: Never Smoker  . Smokeless tobacco: Never Used  . Alcohol use No  . Drug use: No  . Sexual activity: No   Other Topics Concern  . 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 BALLOON DILATION N/A 06/28/2014   Procedure: BALLOON DILATION;  Surgeon: Ladene Artist, MD;  Location: [facility];  Service: Endoscopy;  Laterality: N/A;  . BOTOX INJECTION N/A 07/09/2013   Procedure: BOTOX INJECTION;  Surgeon: Ladene Artist, MD;  [facility]: [facility];  Service: Endoscopy;  Laterality: N/A;  . BOTOX INJECTION N/A 06/28/2014   Procedure: BOTOX INJECTION;  Surgeon: Ladene Artist, MD;  [facility]: [facility];  Service: Endoscopy;  Laterality: N/A;  . CATARACT EXTRACTION W/ INTRAOCULAR LENS  IMPLANT, BILATERAL Bilateral 08-2015-10/2015  . ESOPHAGEAL MANOMETRY N/A 10/27/2012   Procedure: ESOPHAGEAL MANOMETRY (EM);  Surgeon: Ladene Artist, MD;  [facility];  Service: Endoscopy;  Laterality: N/A;  . ESOPHAGOGASTRODUODENOSCOPY N/A 07/09/2013   Procedure: ESOPHAGOGASTRODUODENOSCOPY (EGD);  Surgeon:  Ladene Artist, MD;  Location: [facility];  Service: Endoscopy;  Laterality: N/A;  . ESOPHAGOGASTRODUODENOSCOPY N/A 10/07/2013   Procedure: ESOPHAGOGASTRODUODENOSCOPY (EGD);  Surgeon: Ladene Artist, MD;  Location: [facility];  Service: Endoscopy;  Laterality: N/A;  wtih botox  . ESOPHAGOGASTRODUODENOSCOPY (EGD) WITH PROPOFOL N/A 06/28/2014   Procedure: ESOPHAGOGASTRODUODENOSCOPY (EGD) WITH PROPOFOL;  Surgeon: Ladene Artist, MD;  [facility];  Service: Endoscopy;  Laterality: N/A;  . MASTECTOMY Left  1989    Family History  Problem Relation Age of Onset  . Hypertension Mother     family hx  . Lung cancer Father     family hx  . Stroke      family hx  . Heart disease Sister     family hx    Allergies  Allergen Reactions  . Septra [Bactrim] Swelling  . Sulfa Antibiotics Swelling    Current Outpatient Prescriptions on File Prior to Visit  Medication Sig Dispense Refill  . acetaminophen (TYLENOL) 500 MG tablet Take 500 mg by mouth every 6 (six) hours as needed for mild pain or moderate pain.    Marland Kitchen alum & mag hydroxide-simeth (MYLANTA) [id number] MG/5ML suspension Take 15 mLs by mouth every 6 (six) hours as needed for indigestion or heartburn.    . Ascorbic Acid (VITAMIN C PO) Take 1 tablet by mouth every morning.    Marland Kitchen CRANBERRY PO Take 1 tablet by mouth every morning.    . Cyanocobalamin (VITAMIN B-12 PO) Take 1 tablet by mouth daily.    . dorzolamide (TRUSOPT) 2 % ophthalmic solution Instill 1 drop into both eyes twice a day  1  . ferrous gluconate (FERGON) 325 MG tablet Take 325 mg by mouth daily with breakfast.      . lansoprazole (PREVACID SOLUTAB) 30 MG disintegrating tablet Take 1 tablet (30 mg total) by mouth daily. 30 tablet 3  . latanoprost (XALATAN) 0.005 % ophthalmic solution Place 1 drop into both eyes at bedtime.   0  . LUTEIN PO Take 1 capsule by mouth every morning.    . mirtazapine (REMERON SOLTAB) 15 MG disintegrating tablet Take 1 tablet (15 mg total) by mouth at bedtime as needed. 90 tablet 1  . Multiple Vitamins-Minerals (ONE-A-DAY WOMENS 50 PLUS PO) Take 1 tablet by mouth every morning.    . timolol (TIMOPTIC) 0.5 % ophthalmic solution INSTILL 1 DROP INTO BOTH EYES IN THE MORNING  1  . vitamin E 400 UNIT capsule Take 400 Units by mouth daily.     No current facility-administered medications on file prior to visit.     BP (!) 90/52   Temp 98.2 F (36.8 C) (Oral)   Ht 5\' 3"  (1.6 m)   Wt 101 lb 9.6 oz (46.1 kg)   BMI 18.00 kg/m       Objective:    Physical Exam  Constitutional: She is oriented to person, place, and time. She appears well-developed and well-nourished. No distress.  Eyes: Conjunctivae and EOM are normal. Pupils are equal, round, and reactive to light. Right eye exhibits no discharge. Left eye exhibits no discharge. No scleral icterus.  Cardiovascular: Normal rate, regular rhythm, normal heart sounds and intact distal pulses.  Exam reveals no gallop.   No murmur heard. Pulmonary/Chest: Effort normal and breath sounds normal. No respiratory distress. She has no wheezes. She has no rales. She exhibits no tenderness.  Musculoskeletal: Normal range of motion. She exhibits no edema, tenderness or deformity.  Walks with a slow steady gait with  a 4 prong walker  Neurological: She is alert and oriented to person, place, and time. She has normal strength. She displays no atrophy and no tremor. She displays a negative Romberg sign.  Skin: Skin is warm and dry. No rash noted. She is not diaphoretic. No erythema. No pallor.  Psychiatric: She has a normal mood and affect. Her behavior is normal. Judgment and thought content normal.  Nursing note and vitals reviewed.     Assessment & Plan:  1. Fall, sequela - Home health PT evaluation  - Vitamin B12 - CBC with Differential/Platelet - Basic metabolic panel - POC Urinalysis Dipstick  2. Other fatigue - Home health PT evaluation  - Vitamin B12 - CBC with Differential/Platelet - Basic metabolic panel - POC Urinalysis Dipstick  3. Need for prophylactic vaccination and inoculation against influenza  - Flu vaccine HIGH DOSE PF (Fluzone High dose)  4. ANEMIA, B12 DEFICIENCY - Vitamin B12 - CBC with Differential/Platelet - cyanocobalamin ((VITAMIN B-12)) injection 1,000 mcg; Inject 1 mL (1,000 mcg total) into the muscle once.  Veronica Peng, NP

## 2016-03-29 ENCOUNTER — Telehealth: Payer: Self-pay | Admitting: Adult Health

## 2016-03-29 NOTE — Telephone Encounter (Signed)
Updated daugther of Veronica Phillips's labs. She appeared to be a little dehydrated so I asked her daughter to increase fluids.   They will follow up with me in one month

## 2016-03-30 DIAGNOSIS — M1990 Unspecified osteoarthritis, unspecified site: Secondary | ICD-10-CM | POA: Diagnosis not present

## 2016-03-30 DIAGNOSIS — I10 Essential (primary) hypertension: Secondary | ICD-10-CM | POA: Diagnosis not present

## 2016-03-30 DIAGNOSIS — H409 Unspecified glaucoma: Secondary | ICD-10-CM | POA: Diagnosis not present

## 2016-03-30 DIAGNOSIS — R296 Repeated falls: Secondary | ICD-10-CM | POA: Diagnosis not present

## 2016-03-30 DIAGNOSIS — Z853 Personal history of malignant neoplasm of breast: Secondary | ICD-10-CM | POA: Diagnosis not present

## 2016-03-30 DIAGNOSIS — Z9181 History of falling: Secondary | ICD-10-CM | POA: Diagnosis not present

## 2016-04-06 ENCOUNTER — Telehealth: Payer: Self-pay | Admitting: Adult Health

## 2016-04-06 NOTE — Telephone Encounter (Signed)
Ok to give verbal order.

## 2016-04-06 NOTE — Telephone Encounter (Signed)
Ok to give verbal orders?

## 2016-04-06 NOTE — Telephone Encounter (Signed)
Flor at [facility]. Verbalized understanding.

## 2016-04-06 NOTE — Telephone Encounter (Signed)
Flor with [facility] would like to request verbal orders for PT 2 wk / 4

## 2016-04-27 ENCOUNTER — Encounter: Payer: Self-pay | Admitting: Adult Health

## 2016-04-27 ENCOUNTER — Ambulatory Visit (INDEPENDENT_AMBULATORY_CARE_PROVIDER_SITE_OTHER): Payer: Medicare Other | Admitting: Adult Health

## 2016-04-27 VITALS — BP 132/64 | Temp 97.4°F | Ht 63.0 in | Wt 109.0 lb

## 2016-04-27 DIAGNOSIS — R2681 Unsteadiness on feet: Secondary | ICD-10-CM

## 2016-04-27 DIAGNOSIS — R5383 Other fatigue: Secondary | ICD-10-CM | POA: Diagnosis not present

## 2016-04-27 NOTE — Progress Notes (Signed)
Subjective:    Patient ID: Veronica Phillips, female    DOB: January 20, 1923, 82 y.o.   MRN: [id number]  HPI  82 year old female who presents to the office today for follow up after one month due to falls and fatigue. She has been participating in home health PT. She and her daughter, who is present during this exam feel as though she has benefited greatly from physical therapy. She feels stronger and has a more steady gait. She has not had any falls since the last time she was here.   She continues to feel as though she is fatigued though. This is not a feeling she has every day but most days she feels fatigued.   She denies any fevers, CP, or SOB   Review of Systems  Constitutional: Positive for fatigue.  HENT: Negative.   Respiratory: Negative.   Cardiovascular: Negative.   Gastrointestinal: Negative.   Genitourinary: Negative.   Musculoskeletal: Positive for arthralgias and gait problem.  Psychiatric/Behavioral: Negative.   All other systems reviewed and are negative.  Past Medical History:  Diagnosis Date  . Arthritis    "knees, shoulders" (12/29/2015)  . Atrophic gastritis   . Cancer of left breast ([place]) 1989  . Diverticulosis of colon   . GERD (gastroesophageal reflux disease)   . Glaucoma   . Headache    "awful headaches when I was coming up in my teens"  . Hypertension   . Lactose intolerance   . Pernicious anemia   . Vitamin B 12 deficiency     Social History   Social History  . Marital status: Widowed    Spouse name: N/A  . Number of children: N/A  . Years of education: N/A   Occupational History  . Not on file.   Social History Main Topics  . Smoking status: Never Smoker  . Smokeless tobacco: Never Used  . Alcohol use No  . Drug use: No  . Sexual activity: No   Other Topics Concern  . 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 BALLOON DILATION N/A 06/28/2014    Procedure: BALLOON DILATION;  Surgeon: Ladene Artist, MD;  [facility];  Service: Endoscopy;  Laterality: N/A;  . BOTOX INJECTION N/A 07/09/2013   Procedure: BOTOX INJECTION;  Surgeon: Ladene Artist, MD;  [facility];  Service: Endoscopy;  Laterality: N/A;  . BOTOX INJECTION N/A 06/28/2014   Procedure: BOTOX INJECTION;  Surgeon: Ladene Artist, MD;  Location: [facility];  Service: Endoscopy;  Laterality: N/A;  . CATARACT EXTRACTION W/ INTRAOCULAR LENS  IMPLANT, BILATERAL Bilateral 08-2015-10/2015  . ESOPHAGEAL MANOMETRY N/A 10/27/2012   Procedure: ESOPHAGEAL MANOMETRY (EM);  Surgeon: Ladene Artist, MD;  [facility];  Service: Endoscopy;  Laterality: N/A;  . ESOPHAGOGASTRODUODENOSCOPY N/A 07/09/2013   Procedure: ESOPHAGOGASTRODUODENOSCOPY (EGD);  Surgeon: Ladene Artist, MD;  Location: [facility];  Service: Endoscopy;  Laterality: N/A;  . ESOPHAGOGASTRODUODENOSCOPY N/A 10/07/2013   Procedure: ESOPHAGOGASTRODUODENOSCOPY (EGD);  Surgeon: Ladene Artist, MD;  Location: [facility];  Service: Endoscopy;  Laterality: N/A;  wtih botox  . ESOPHAGOGASTRODUODENOSCOPY (EGD) WITH PROPOFOL N/A 06/28/2014   Procedure: ESOPHAGOGASTRODUODENOSCOPY (EGD) WITH PROPOFOL;  Surgeon: Ladene Artist, MD;  [facility];  Service: Endoscopy;  Laterality: N/A;  . MASTECTOMY Left 1989    Family History  Problem Relation Age of Onset  .  Hypertension Mother     family hx  . Lung cancer Father     family hx  . Stroke      family hx  . Heart disease Sister     family hx    Allergies  Allergen Reactions  . Septra [Bactrim] Swelling  . Sulfa Antibiotics Swelling    Current Outpatient Prescriptions on File Prior to Visit  Medication Sig Dispense Refill  . acetaminophen (TYLENOL) 500 MG tablet Take 500 mg by mouth every 6 (six) hours as needed for mild pain or moderate pain.    Marland Kitchen alum & mag hydroxide-simeth (MYLANTA) [id number] MG/5ML suspension Take 15 mLs by  mouth every 6 (six) hours as needed for indigestion or heartburn.    . Ascorbic Acid (VITAMIN C PO) Take 1 tablet by mouth every morning.    Marland Kitchen CRANBERRY PO Take 1 tablet by mouth every morning.    . Cyanocobalamin (VITAMIN B-12 PO) Take 1 tablet by mouth daily.    . ferrous gluconate (FERGON) 325 MG tablet Take 325 mg by mouth daily with breakfast.      . lansoprazole (PREVACID SOLUTAB) 30 MG disintegrating tablet Take 1 tablet (30 mg total) by mouth daily. 30 tablet 3  . latanoprost (XALATAN) 0.005 % ophthalmic solution Place 1 drop into both eyes at bedtime.   0  . LUTEIN PO Take 1 capsule by mouth every morning.    . mirtazapine (REMERON SOLTAB) 15 MG disintegrating tablet Take 1 tablet (15 mg total) by mouth at bedtime as needed. 90 tablet 1  . Multiple Vitamins-Minerals (ONE-A-DAY WOMENS 50 PLUS PO) Take 1 tablet by mouth every morning.    . vitamin E 400 UNIT capsule Take 400 Units by mouth daily.     No current facility-administered medications on file prior to visit.     BP 132/64   Temp 97.4 F (36.3 C) (Oral)   Ht 5\' 3"  (1.6 m)   Wt 109 lb (49.4 kg)   BMI 19.31 kg/m       Objective:   Physical Exam  Constitutional: She is oriented to person, place, and time. She appears well-developed and well-nourished. No distress.  Cardiovascular: Normal rate, regular rhythm, normal heart sounds and intact distal pulses.  Exam reveals no gallop and no friction rub.   No murmur heard. Pulmonary/Chest: Effort normal and breath sounds normal. No respiratory distress. She has no wheezes. She has no rales. She exhibits no tenderness.  Neurological: She is alert and oriented to person, place, and time.  Skin: Skin is warm and dry. No rash noted. She is not diaphoretic. No erythema. No pallor.  Psychiatric: She has a normal mood and affect. Her behavior is normal. Judgment and thought content normal.  Nursing note and vitals reviewed.     Assessment & Plan:  1. Gait instability - Continue  with PT as she is making good progress - Follow up as needed  2. Fatigue, unspecified type - Thyroid is normal - She is slightly anemic. She can try an OTC iron supplement - Can also add vitamin D and calcium supplement as she has stopped taking this.  - Follow up as needed  Dorothyann Peng, NP

## 2016-04-30 ENCOUNTER — Telehealth: Payer: Self-pay | Admitting: Adult Health

## 2016-04-30 NOTE — Telephone Encounter (Signed)
wes from [facility] needs verbal to extend PT for  2 wk/ 2

## 2016-04-30 NOTE — Telephone Encounter (Signed)
Ok to extended orders

## 2016-05-01 NOTE — Telephone Encounter (Signed)
Left detailed message on Wes's personal voicemail, verbal orders given okay to extend PT for pt 2 x's a week for 2 weeks okay per [facility]. Any questions please call office.

## 2016-06-06 DIAGNOSIS — H401131 Primary open-angle glaucoma, bilateral, mild stage: Secondary | ICD-10-CM | POA: Diagnosis not present

## 2016-06-06 DIAGNOSIS — L723 Sebaceous cyst: Secondary | ICD-10-CM | POA: Diagnosis not present

## 2016-09-05 DIAGNOSIS — H401131 Primary open-angle glaucoma, bilateral, mild stage: Secondary | ICD-10-CM | POA: Diagnosis not present

## 2016-11-07 ENCOUNTER — Ambulatory Visit (INDEPENDENT_AMBULATORY_CARE_PROVIDER_SITE_OTHER): Payer: Medicare Other | Admitting: Family Medicine

## 2016-11-07 ENCOUNTER — Encounter: Payer: Self-pay | Admitting: Family Medicine

## 2016-11-07 VITALS — BP 100/60 | HR 76 | Temp 98.2°F | Wt 111.8 lb

## 2016-11-07 DIAGNOSIS — R5383 Other fatigue: Secondary | ICD-10-CM

## 2016-11-07 DIAGNOSIS — R300 Dysuria: Secondary | ICD-10-CM | POA: Diagnosis not present

## 2016-11-07 LAB — POCT URINALYSIS DIPSTICK
BILIRUBIN UA: NEGATIVE
GLUCOSE UA: NEGATIVE
Ketones, UA: NEGATIVE
Nitrite, UA: NEGATIVE
PH UA: 6 (ref 5.0–8.0)
Protein, UA: 100
SPEC GRAV UA: 1.015 (ref 1.010–1.025)
Urobilinogen, UA: 0.2 E.U./dL

## 2016-11-07 MED ORDER — CEPHALEXIN 500 MG PO CAPS: 500.0000 mg | ORAL_CAPSULE | Freq: Three times a day (TID) | ORAL | 0 refills | Status: DC

## 2016-11-07 NOTE — Patient Instructions (Signed)
Drink plenty of fluids Start the antibiotic. Let us know if not feeling better by next week.

## 2016-11-07 NOTE — Progress Notes (Signed)
Subjective:     Patient ID: Veronica Phillips, female   DOB: 03-31-1923, 83 y.o.   MRN: [id number]  HPI 83 year old female who is seen today as a work in with very nonspecific symptoms of occasional lightheadedness and fatigue for the past week. Daughter states that she had URI type symptoms about 2 weeks ago. She's had infrequent cough. No urinary symptoms though she's had frequent UTIs in the past. No fever. No appetite or weight changes. No chest pain. Denies any depression. She has chronic anemia is on chronic B12.  Past Medical History:  Diagnosis Date  . Arthritis    "knees, shoulders" (12/29/2015)  . Atrophic gastritis   . Cancer of left breast ([place]) 1989  . Diverticulosis of colon   . GERD (gastroesophageal reflux disease)   . Glaucoma   . Headache    "awful headaches when I was coming up in my teens"  . Hypertension   . Lactose intolerance   . Pernicious anemia   . Vitamin B 12 deficiency    Past Surgical History:  Procedure Laterality Date  . BALLOON DILATION N/A 06/28/2014   Procedure: BALLOON DILATION;  Surgeon: Ladene Artist, MD;  [facility];  Service: Endoscopy;  Laterality: N/A;  . BOTOX INJECTION N/A 07/09/2013   Procedure: BOTOX INJECTION;  Surgeon: Ladene Artist, MD;  [facility];  Service: Endoscopy;  Laterality: N/A;  . BOTOX INJECTION N/A 06/28/2014   Procedure: BOTOX INJECTION;  Surgeon: Ladene Artist, MD;  [facility];  Service: Endoscopy;  Laterality: N/A;  . CATARACT EXTRACTION W/ INTRAOCULAR LENS  IMPLANT, BILATERAL Bilateral 08-2015-10/2015  . ESOPHAGEAL MANOMETRY N/A 10/27/2012   Procedure: ESOPHAGEAL MANOMETRY (EM);  Surgeon: Ladene Artist, MD;  [facility];  Service: Endoscopy;  Laterality: N/A;  . ESOPHAGOGASTRODUODENOSCOPY N/A 07/09/2013   Procedure: ESOPHAGOGASTRODUODENOSCOPY (EGD);  Surgeon: Ladene Artist, MD;  Location: [facility];  Service: Endoscopy;  Laterality: N/A;  . ESOPHAGOGASTRODUODENOSCOPY N/A  10/07/2013   Procedure: ESOPHAGOGASTRODUODENOSCOPY (EGD);  Surgeon: Ladene Artist, MD;  Location: [facility];  Service: Endoscopy;  Laterality: N/A;  wtih botox  . ESOPHAGOGASTRODUODENOSCOPY (EGD) WITH PROPOFOL N/A 06/28/2014   Procedure: ESOPHAGOGASTRODUODENOSCOPY (EGD) WITH PROPOFOL;  Surgeon: Ladene Artist, MD;  [facility];  Service: Endoscopy;  Laterality: N/A;  . MASTECTOMY Left 1989    reports that she has never smoked. She has never used smokeless tobacco. She reports that she does not drink alcohol or use drugs. family history includes Heart disease in her sister; Hypertension in her mother; Lung cancer in her father. Allergies  Allergen Reactions  . Septra [Bactrim] Swelling  . Sulfa Antibiotics Swelling     Review of Systems  Constitutional: Positive for fatigue. Negative for appetite change, chills and fever.  Respiratory: Negative for shortness of breath and wheezing.   Cardiovascular: Positive for leg swelling. Negative for chest pain and palpitations.  Gastrointestinal: Negative for abdominal pain, diarrhea, nausea and vomiting.  Genitourinary: Negative for difficulty urinating.  Neurological: Negative for syncope.       Objective:   Physical Exam  Constitutional: She appears well-developed and well-nourished.  HENT:  Mouth/Throat: Oropharynx is clear and moist.  Neck: Neck supple.  Cardiovascular: Normal rate and regular rhythm.   Pulmonary/Chest: Effort normal and breath sounds normal. No respiratory distress. She has no wheezes. She has no rales.  Abdominal: Soft. There is no tenderness.  Neurological: She is alert.       Assessment:     Patient  presents with very nonspecific symptoms of fatigue and lightheadedness. Nonfocal exam.  History of chronic anemia.     Plan:     -Check urinalysis.  Urine dip shows blood and leukocytes -send urine culture -start Keflex 500 mg po tid for 7 days pending cx results.  Eulas Post MD [place]  [facility] [place]    -

## 2016-11-08 DIAGNOSIS — R5383 Other fatigue: Secondary | ICD-10-CM | POA: Diagnosis not present

## 2016-11-08 DIAGNOSIS — R300 Dysuria: Secondary | ICD-10-CM | POA: Diagnosis not present

## 2016-11-10 LAB — URINE CULTURE

## 2016-11-12 ENCOUNTER — Other Ambulatory Visit: Payer: Self-pay | Admitting: Family Medicine

## 2016-11-12 MED ORDER — CIPROFLOXACIN HCL 250 MG PO TABS: 250.0000 mg | ORAL_TABLET | Freq: Two times a day (BID) | ORAL | 0 refills | Status: DC

## 2016-11-23 ENCOUNTER — Ambulatory Visit (INDEPENDENT_AMBULATORY_CARE_PROVIDER_SITE_OTHER): Payer: Medicare Other | Admitting: Adult Health

## 2016-11-23 ENCOUNTER — Other Ambulatory Visit: Payer: Self-pay | Admitting: Adult Health

## 2016-11-23 ENCOUNTER — Encounter: Payer: Self-pay | Admitting: Adult Health

## 2016-11-23 VITALS — BP 152/70 | Ht 63.0 in | Wt 108.4 lb

## 2016-11-23 DIAGNOSIS — J0100 Acute maxillary sinusitis, unspecified: Secondary | ICD-10-CM | POA: Diagnosis not present

## 2016-11-23 DIAGNOSIS — R5383 Other fatigue: Secondary | ICD-10-CM | POA: Diagnosis not present

## 2016-11-23 LAB — POCT URINALYSIS DIPSTICK
Bilirubin, UA: NEGATIVE
Blood, UA: NEGATIVE
Glucose, UA: NEGATIVE
Ketones, UA: NEGATIVE
NITRITE UA: NEGATIVE
Spec Grav, UA: 1.02 (ref 1.010–1.025)
UROBILINOGEN UA: 0.2 U/dL
pH, UA: 6 (ref 5.0–8.0)

## 2016-11-23 LAB — CBC WITH DIFFERENTIAL/PLATELET
Basophils Absolute: 0 10*3/uL (ref 0.0–0.1)
Basophils Relative: 0.1 % (ref 0.0–3.0)
EOS ABS: 0 10*3/uL (ref 0.0–0.7)
EOS PCT: 0 % (ref 0.0–5.0)
HEMATOCRIT: 34.4 % — AB (ref 36.0–46.0)
Hemoglobin: 11.3 g/dL — ABNORMAL LOW (ref 12.0–15.0)
LYMPHS PCT: 23.1 % (ref 12.0–46.0)
Lymphs Abs: 1.2 10*3/uL (ref 0.7–4.0)
MCHC: 32.9 g/dL (ref 30.0–36.0)
MCV: 92.8 fl (ref 78.0–100.0)
MONO ABS: 0.5 10*3/uL (ref 0.1–1.0)
Monocytes Relative: 9.9 % (ref 3.0–12.0)
Neutro Abs: 3.4 10*3/uL (ref 1.4–7.7)
Neutrophils Relative %: 66.9 % (ref 43.0–77.0)
Platelets: 263 10*3/uL (ref 150.0–400.0)
RBC: 3.71 Mil/uL — AB (ref 3.87–5.11)
RDW: 14.5 % (ref 11.5–15.5)
WBC: 5.1 10*3/uL (ref 4.0–10.5)

## 2016-11-23 LAB — TSH: TSH: 3.5 u[IU]/mL (ref 0.35–4.50)

## 2016-11-23 LAB — BASIC METABOLIC PANEL
BUN: 29 mg/dL — ABNORMAL HIGH (ref 6–23)
CO2: 27 mEq/L (ref 19–32)
Calcium: 8.9 mg/dL (ref 8.4–10.5)
Chloride: 107 mEq/L (ref 96–112)
Creatinine, Ser: 1.36 mg/dL — ABNORMAL HIGH (ref 0.40–1.20)
GFR: 46.82 mL/min — ABNORMAL LOW (ref >60.00)
Glucose, Bld: 145 mg/dL — ABNORMAL HIGH (ref 70–99)
POTASSIUM: 4 meq/L (ref 3.5–5.1)
Sodium: 141 mEq/L (ref 135–145)

## 2016-11-23 LAB — VITAMIN D 25 HYDROXY (VIT D DEFICIENCY, FRACTURES): VITD: 37.8 ng/mL (ref 30.00–100.00)

## 2016-11-23 MED ORDER — FLUTICASONE PROPIONATE 50 MCG/ACT NA SUSP: 2.0000 | Freq: Every day | NASAL | 6 refills | Status: DC

## 2016-11-23 NOTE — Progress Notes (Signed)
Subjective:    Patient ID: Veronica Phillips, female    DOB: Dec 06, 1922, 83 y.o.   MRN: [id number]  HPI 83 year old female who was seen by Dr. Elease Hashimoto on 11/07/2016 with non specific symptoms of occasional lightheadedness and fatigue for a week. She did not have any UTI symptoms but ended up having a UTI and ultimately started on Cipro x 7 days.   Today in the office she reports that she finished her antibiotics but continues to feel fatigued and has little  energy. She denies any UTI like symptoms.   She denies any fevers, nausea, vomiting, or diarrhea.   She is eating well and staying hydrated.   Her only other complaint is that of sinusitis like symptoms. She has been using Claritin PRN and Afrin PRN - she states this helps. Denies any cough, PND, or headaches. She does endorse slight sinus pressure in the maxillary sinus cavity    Review of Systems See HPI  Past Medical History:  Diagnosis Date  . Arthritis    "knees, shoulders" (12/29/2015)  . Atrophic gastritis   . Cancer of left breast ([place]) 1989  . Diverticulosis of colon   . GERD (gastroesophageal reflux disease)   . Glaucoma   . Headache    "awful headaches when I was coming up in my teens"  . Hypertension   . Lactose intolerance   . Pernicious anemia   . Vitamin B 12 deficiency     Social History   Social History  . Marital status: Widowed    Spouse name: N/A  . Number of children: N/A  . Years of education: N/A   Occupational History  . Not on file.   Social History Main Topics  . Smoking status: Never Smoker  . Smokeless tobacco: Never Used  . Alcohol use No  . Drug use: No  . Sexual activity: No   Other Topics Concern  . 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 BALLOON DILATION N/A 06/28/2014   Procedure: BALLOON DILATION;  Surgeon: Ladene Artist, MD;  Location: [facility];  Service: Endoscopy;   Laterality: N/A;  . BOTOX INJECTION N/A 07/09/2013   Procedure: BOTOX INJECTION;  Surgeon: Ladene Artist, MD;  Location: [facility];  Service: Endoscopy;  Laterality: N/A;  . BOTOX INJECTION N/A 06/28/2014   Procedure: BOTOX INJECTION;  Surgeon: Ladene Artist, MD;  Location: [facility];  Service: Endoscopy;  Laterality: N/A;  . CATARACT EXTRACTION W/ INTRAOCULAR LENS  IMPLANT, BILATERAL Bilateral 08-2015-10/2015  . ESOPHAGEAL MANOMETRY N/A 10/27/2012   Procedure: ESOPHAGEAL MANOMETRY (EM);  Surgeon: Ladene Artist, MD;  Location: [facility];  Service: Endoscopy;  Laterality: N/A;  . ESOPHAGOGASTRODUODENOSCOPY N/A 07/09/2013   Procedure: ESOPHAGOGASTRODUODENOSCOPY (EGD);  Surgeon: Ladene Artist, MD;  Location: [facility];  Service: Endoscopy;  Laterality: N/A;  . ESOPHAGOGASTRODUODENOSCOPY N/A 10/07/2013   Procedure: ESOPHAGOGASTRODUODENOSCOPY (EGD);  Surgeon: Ladene Artist, MD;  Location: [facility];  Service: Endoscopy;  Laterality: N/A;  wtih botox  . ESOPHAGOGASTRODUODENOSCOPY (EGD) WITH PROPOFOL N/A 06/28/2014   Procedure: ESOPHAGOGASTRODUODENOSCOPY (EGD) WITH PROPOFOL;  Surgeon: Ladene Artist, MD;  Location: [facility];  Service: Endoscopy;  Laterality: N/A;  . MASTECTOMY Left 1989    Family History  Problem Relation Age of Onset  . Hypertension Mother        family hx  .  Lung cancer Father        family hx  . Stroke Unknown        family hx  . Heart disease Sister        family hx    Allergies  Allergen Reactions  . Septra [Bactrim] Swelling  . Sulfa Antibiotics Swelling    Current Outpatient Prescriptions on File Prior to Visit  Medication Sig Dispense Refill  . acetaminophen (TYLENOL) 500 MG tablet Take 500 mg by mouth every 6 (six) hours as needed for mild pain or moderate pain.    Marland Kitchen alum & mag hydroxide-simeth (MYLANTA) 952-841-32 MG/5ML suspension Take 15 mLs by mouth every 6 (six) hours as needed for indigestion or heartburn.    . Ascorbic Acid (VITAMIN  C PO) Take 1 tablet by mouth every morning.    Marland Kitchen CRANBERRY PO Take 1 tablet by mouth every morning.    . Cyanocobalamin (VITAMIN B-12 PO) Take 1 tablet by mouth daily.    . ferrous gluconate (FERGON) 325 MG tablet Take 325 mg by mouth daily with breakfast.      . lansoprazole (PREVACID SOLUTAB) 30 MG disintegrating tablet Take 1 tablet (30 mg total) by mouth daily. 30 tablet 3  . latanoprost (XALATAN) 0.005 % ophthalmic solution Place 1 drop into both eyes at bedtime.   0  . LUTEIN PO Take 1 capsule by mouth every morning.    . mirtazapine (REMERON SOLTAB) 15 MG disintegrating tablet Take 1 tablet (15 mg total) by mouth at bedtime as needed. 90 tablet 1  . Multiple Vitamins-Minerals (ONE-A-DAY WOMENS 50 PLUS PO) Take 1 tablet by mouth every morning.    . vitamin E 400 UNIT capsule Take 400 Units by mouth daily.     No current facility-administered medications on file prior to visit.     BP (!) 152/70 (BP Location: Left Arm, Patient Position: Sitting, Cuff Size: Normal)   Ht 5\' 3"  (1.6 m)   Wt 108 lb 6.4 oz (49.2 kg)   BMI 19.20 kg/m       Objective:   Physical Exam  Constitutional: She is oriented to person, place, and time. She appears well-developed and well-nourished. No distress.  HENT:  Nose: Mucosal edema present. No rhinorrhea. Right sinus exhibits no maxillary sinus tenderness and no frontal sinus tenderness. Left sinus exhibits no maxillary sinus tenderness and no frontal sinus tenderness.  Mouth/Throat: Uvula is midline, oropharynx is clear and moist and mucous membranes are normal.  Cardiovascular: Normal rate, regular rhythm, normal heart sounds and intact distal pulses.  Exam reveals no gallop and no friction rub.   No murmur heard. Pulmonary/Chest: Effort normal and breath sounds normal. No respiratory distress. She has no wheezes. She has no rales. She exhibits no tenderness.  Abdominal: Soft. Bowel sounds are normal. She exhibits no distension and no mass. There is no  tenderness. There is no rebound and no guarding.  Neurological: She is alert and oriented to person, place, and time.  Skin: Skin is warm and dry. No rash noted. She is not diaphoretic. No erythema. No pallor.  Psychiatric: She has a normal mood and affect. Her behavior is normal. Judgment and thought content normal.  Nursing note and vitals reviewed.     Assessment & Plan:  1. Fatigue, unspecified type - Unknown cause of fatigue at this time. Will check labs and see if UTI has cleared  - CBC with Differential/Platelet - Vitamin D, 25-hydroxy - TSH - Basic metabolic panel - POC Urinalysis  Dipstick  2. Acute maxillary sinusitis, recurrence not specified - fluticasone (FLONASE) 50 MCG/ACT nasal spray; Place 2 sprays into both nostrils daily.  Dispense: 16 g; Refill: 6 - Stop afrin   Dorothyann Peng, NP

## 2016-11-23 NOTE — Addendum Note (Signed)
Addended by: Sandria Bales B on: 11/23/2016 03:17 PM   Modules accepted: Orders

## 2016-11-23 NOTE — Patient Instructions (Signed)
It was great seeing you today   I am going to make sure your UTI has cleared and get some blood work to try and find out why you are fatigued   Stay hydrated   I have also sent in flonase - use this instead of Afrin

## 2016-11-29 ENCOUNTER — Other Ambulatory Visit: Payer: Self-pay | Admitting: Adult Health

## 2016-11-29 DIAGNOSIS — R8290 Unspecified abnormal findings in urine: Secondary | ICD-10-CM

## 2016-12-03 ENCOUNTER — Other Ambulatory Visit: Payer: Medicare Other

## 2016-12-03 DIAGNOSIS — R8290 Unspecified abnormal findings in urine: Secondary | ICD-10-CM | POA: Diagnosis not present

## 2016-12-05 ENCOUNTER — Other Ambulatory Visit: Payer: Self-pay | Admitting: Adult Health

## 2016-12-05 DIAGNOSIS — H401131 Primary open-angle glaucoma, bilateral, mild stage: Secondary | ICD-10-CM | POA: Diagnosis not present

## 2016-12-05 LAB — URINE CULTURE

## 2016-12-05 MED ORDER — LEVOFLOXACIN 500 MG PO TABS: 500.0000 mg | ORAL_TABLET | Freq: Every day | ORAL | 0 refills | Status: DC

## 2016-12-21 ENCOUNTER — Telehealth: Payer: Self-pay | Admitting: Adult Health

## 2016-12-21 DIAGNOSIS — R410 Disorientation, unspecified: Secondary | ICD-10-CM

## 2016-12-21 NOTE — Telephone Encounter (Signed)
Daughter would like to know if pt should return to have UA done, since she never had symptoms in the beginning and still does not have any.  Also, daughter would like to know iof you think tsh labs may be considered because pt has been so tired.

## 2016-12-21 NOTE — Telephone Encounter (Signed)
Please advise 

## 2016-12-25 NOTE — Telephone Encounter (Signed)
That is fine. She can come in and just leave a sample.   As for the thyroid, I checked this last month and it was normal

## 2016-12-25 NOTE — Telephone Encounter (Signed)
Spoke with pt's daughter and informed her that she can come in and just leave a sample. Orders were placed in epic.   As for the thyroid, I checked this last month and it was normal.  Pt daughter verbalized understanding

## 2016-12-27 ENCOUNTER — Other Ambulatory Visit (INDEPENDENT_AMBULATORY_CARE_PROVIDER_SITE_OTHER): Payer: Medicare Other

## 2016-12-27 DIAGNOSIS — R410 Disorientation, unspecified: Secondary | ICD-10-CM | POA: Diagnosis not present

## 2016-12-27 LAB — POC URINALSYSI DIPSTICK (AUTOMATED)
Bilirubin, UA: NEGATIVE
Blood, UA: NEGATIVE
GLUCOSE UA: NEGATIVE
Ketones, UA: NEGATIVE
Nitrite, UA: NEGATIVE
PROTEIN UA: NEGATIVE
Spec Grav, UA: <=1.005 — AB (ref 1.010–1.025)
UROBILINOGEN UA: 0.2 U/dL
pH, UA: 6 (ref 5.0–8.0)

## 2016-12-30 LAB — URINE CULTURE

## 2017-01-24 ENCOUNTER — Encounter (HOSPITAL_COMMUNITY): Payer: Self-pay | Admitting: Emergency Medicine

## 2017-01-24 ENCOUNTER — Emergency Department (HOSPITAL_COMMUNITY)
Admission: EM | Admit: 2017-01-24 | Discharge: 2017-01-25 | Disposition: A | Discharge status: Home / Self Care | Payer: Medicare Other | Attending: Emergency Medicine | Admitting: Emergency Medicine

## 2017-01-24 DIAGNOSIS — J181 Lobar pneumonia, unspecified organism: Secondary | ICD-10-CM | POA: Diagnosis not present

## 2017-01-24 DIAGNOSIS — N390 Urinary tract infection, site not specified: Secondary | ICD-10-CM

## 2017-01-24 DIAGNOSIS — M25521 Pain in right elbow: Secondary | ICD-10-CM | POA: Diagnosis not present

## 2017-01-24 DIAGNOSIS — Z853 Personal history of malignant neoplasm of breast: Secondary | ICD-10-CM | POA: Diagnosis not present

## 2017-01-24 DIAGNOSIS — Z79899 Other long term (current) drug therapy: Secondary | ICD-10-CM | POA: Insufficient documentation

## 2017-01-24 DIAGNOSIS — Y93G1 Activity, food preparation and clean up: Secondary | ICD-10-CM | POA: Diagnosis not present

## 2017-01-24 DIAGNOSIS — N183 Chronic kidney disease, stage 3 (moderate): Secondary | ICD-10-CM | POA: Diagnosis not present

## 2017-01-24 DIAGNOSIS — J189 Pneumonia, unspecified organism: Secondary | ICD-10-CM

## 2017-01-24 DIAGNOSIS — S199XXA Unspecified injury of neck, initial encounter: Secondary | ICD-10-CM | POA: Diagnosis not present

## 2017-01-24 DIAGNOSIS — Y92000 Kitchen of unspecified non-institutional (private) residence as  the place of occurrence of the external cause: Secondary | ICD-10-CM | POA: Insufficient documentation

## 2017-01-24 DIAGNOSIS — S299XXA Unspecified injury of thorax, initial encounter: Secondary | ICD-10-CM | POA: Diagnosis not present

## 2017-01-24 DIAGNOSIS — I129 Hypertensive chronic kidney disease with stage 1 through stage 4 chronic kidney disease, or unspecified chronic kidney disease: Secondary | ICD-10-CM | POA: Diagnosis not present

## 2017-01-24 DIAGNOSIS — S0101XA Laceration without foreign body of scalp, initial encounter: Secondary | ICD-10-CM

## 2017-01-24 DIAGNOSIS — Y999 Unspecified external cause status: Secondary | ICD-10-CM | POA: Insufficient documentation

## 2017-01-24 DIAGNOSIS — W19XXXA Unspecified fall, initial encounter: Secondary | ICD-10-CM

## 2017-01-24 DIAGNOSIS — M25561 Pain in right knee: Secondary | ICD-10-CM | POA: Diagnosis not present

## 2017-01-24 DIAGNOSIS — W01190A Fall on same level from slipping, tripping and stumbling with subsequent striking against furniture, initial encounter: Secondary | ICD-10-CM | POA: Diagnosis not present

## 2017-01-24 DIAGNOSIS — S59901A Unspecified injury of right elbow, initial encounter: Secondary | ICD-10-CM | POA: Diagnosis not present

## 2017-01-24 LAB — CBC WITH DIFFERENTIAL/PLATELET
BASOS ABS: 0 10*3/uL (ref 0.0–0.1)
BASOS PCT: 0 %
Eosinophils Absolute: 0 10*3/uL (ref 0.0–0.7)
Eosinophils Relative: 0 %
HEMATOCRIT: 34.8 % — AB (ref 36.0–46.0)
HEMOGLOBIN: 11.1 g/dL — AB (ref 12.0–15.0)
Lymphocytes Relative: 25 %
Lymphs Abs: 1.3 10*3/uL (ref 0.7–4.0)
MCH: 29.1 pg (ref 26.0–34.0)
MCHC: 31.9 g/dL (ref 30.0–36.0)
MCV: 91.1 fL (ref 78.0–100.0)
Monocytes Absolute: 0.6 10*3/uL (ref 0.1–1.0)
Monocytes Relative: 11 %
NEUTROS ABS: 3.4 10*3/uL (ref 1.7–7.7)
NEUTROS PCT: 64 %
Platelets: 263 10*3/uL (ref 150–400)
RBC: 3.82 MIL/uL — ABNORMAL LOW (ref 3.87–5.11)
RDW: 14.5 % (ref 11.5–15.5)
WBC: 5.3 10*3/uL (ref 4.0–10.5)

## 2017-01-24 LAB — BASIC METABOLIC PANEL
ANION GAP: 6 (ref 5–15)
BUN: 17 mg/dL (ref 6–20)
CHLORIDE: 109 mmol/L (ref 101–111)
CO2: 25 mmol/L (ref 22–32)
Calcium: 8.9 mg/dL (ref 8.9–10.3)
Creatinine, Ser: 1.27 mg/dL — ABNORMAL HIGH (ref 0.44–1.00)
GFR calc non Af Amer: 36 mL/min — ABNORMAL LOW (ref >60)
GFR, EST AFRICAN AMERICAN: 41 mL/min — AB (ref >60)
Glucose, Bld: 137 mg/dL — ABNORMAL HIGH (ref 65–99)
Potassium: 3.4 mmol/L — ABNORMAL LOW (ref 3.5–5.1)
Sodium: 140 mmol/L (ref 135–145)

## 2017-01-24 LAB — URINALYSIS, ROUTINE W REFLEX MICROSCOPIC
BILIRUBIN URINE: NEGATIVE
GLUCOSE, UA: NEGATIVE mg/dL
KETONES UR: NEGATIVE mg/dL
NITRITE: NEGATIVE
PH: 5 (ref 5.0–8.0)
Protein, ur: 100 mg/dL — AB
Specific Gravity, Urine: 1.02 (ref 1.005–1.030)

## 2017-01-24 NOTE — ED Triage Notes (Signed)
Pt states she lost her balance going to the sink on her kitchen she fell and hit the back of her head, denies any LOC states she has frequently fall, small laceration on the posterior head.

## 2017-01-25 ENCOUNTER — Emergency Department (HOSPITAL_COMMUNITY): Payer: Medicare Other

## 2017-01-25 ENCOUNTER — Encounter (HOSPITAL_COMMUNITY): Payer: Self-pay | Admitting: Emergency Medicine

## 2017-01-25 DIAGNOSIS — S0101XA Laceration without foreign body of scalp, initial encounter: Secondary | ICD-10-CM | POA: Diagnosis not present

## 2017-01-25 DIAGNOSIS — S299XXA Unspecified injury of thorax, initial encounter: Secondary | ICD-10-CM | POA: Diagnosis not present

## 2017-01-25 DIAGNOSIS — S199XXA Unspecified injury of neck, initial encounter: Secondary | ICD-10-CM | POA: Diagnosis not present

## 2017-01-25 DIAGNOSIS — M25521 Pain in right elbow: Secondary | ICD-10-CM | POA: Diagnosis not present

## 2017-01-25 DIAGNOSIS — M25561 Pain in right knee: Secondary | ICD-10-CM | POA: Diagnosis not present

## 2017-01-25 DIAGNOSIS — S59901A Unspecified injury of right elbow, initial encounter: Secondary | ICD-10-CM | POA: Diagnosis not present

## 2017-01-25 MED ORDER — DOXYCYCLINE HYCLATE 100 MG PO CAPS: 100.0000 mg | ORAL_CAPSULE | Freq: Two times a day (BID) | ORAL | 0 refills | Status: AC

## 2017-01-25 MED ORDER — CEPHALEXIN 250 MG PO CAPS: 500.0000 mg | ORAL_CAPSULE | Freq: Once | ORAL | Status: DC

## 2017-01-25 MED ORDER — DOXYCYCLINE HYCLATE 100 MG PO TABS
100.0000 mg | ORAL_TABLET | Freq: Once | ORAL | Status: AC
  Administered 2017-01-25: 100 mg via ORAL
  Filled 2017-01-25: qty 1

## 2017-01-25 NOTE — ED Notes (Signed)
Ambulated Pt in [place] with no assistance needed. Pt did not use a walker or cane. Pt stated she was not dizzy.

## 2017-01-25 NOTE — ED Notes (Signed)
Patient transported to X-ray 

## 2017-01-25 NOTE — ED Provider Notes (Signed)
[facility] DEPT Provider Note   CSN: [id number] Arrival date & time: 01/24/17  2111     History   Chief Complaint Chief Complaint  Patient presents with  . Fall   HPI  Blood pressure (!) 97/54, pulse 69, temperature 98.5 F (36.9 C), temperature source Oral, resp. rate 16, height 5\' 2"  (1.575 m), weight 49 kg (108 lb), SpO2 98 %.  Veronica Phillips is a 83 y.o. female fall earlier in the evening when she was walking into the kitchen to put her sandwich away. She states that she cannot explain exactly why she fell, she does have a history of frequent falls. There was no loss of consciousness, there was no prodrome of chest pain, palpitations, feeling lightheaded or change in vision. She hits the occipital area of her head against a kitchen cabinet handle. She's not anticoagulated. She denies any significant bleeding, headache, change of vision, dysarthria, ataxia, cervicalgia, chest pain, abdominal pain. She denies any dysuria or hematuria, as per her daughter she has a chronic UTI she done multiple antibiotics, she is not currently on antibiotics at this time.  Past Medical History:  Diagnosis Date  . Arthritis    "knees, shoulders" (12/29/2015)  . Atrophic gastritis   . Cancer of left breast ([place]) 1989  . Diverticulosis of colon   . GERD (gastroesophageal reflux disease)   . Glaucoma   . Headache    "awful headaches when I was coming up in my teens"  . Hypertension   . Lactose intolerance   . Pernicious anemia   . Vitamin B 12 deficiency     Patient Active Problem List   Diagnosis Date Noted  . CKD (chronic kidney disease), stage III 12/29/2015  . Dizziness 12/29/2015  . UTI (lower urinary tract infection) 12/29/2015  . Orthostatic syncope 12/29/2015  . Fall   . Chronic anemia   . Need for prophylactic vaccination against Streptococcus pneumoniae (pneumococcus) 01/06/2015  . Precordial chest pain 11/05/2014  . Dysphagia, pharyngoesophageal phase   . Benign neoplasm of  stomach 07/09/2013  . Achalasia 07/07/2013  . Peripheral edema 05/19/2012  . Glaucoma 07/17/2010  . GERD 07/17/2010  . OTHER DYSPHAGIA 07/17/2010  . PROBLEMS WITH SWALLOWING AND MASTICATION 07/11/2010  . IDIOPATHIC URTICARIA 04/11/2010  . ANEMIA, B12 DEFICIENCY 05/18/2008  . OSTEOARTHRITIS 05/12/2007  . DIVERTICULOSIS, COLON 02/26/2007    Past Surgical History:  Procedure Laterality Date  . BALLOON DILATION N/A 06/28/2014   Procedure: BALLOON DILATION;  Surgeon: Ladene Artist, MD;  Location: [facility];  Service: Endoscopy;  Laterality: N/A;  . BOTOX INJECTION N/A 07/09/2013   Procedure: BOTOX INJECTION;  Surgeon: Ladene Artist, MD;  Location: [facility];  Service: Endoscopy;  Laterality: N/A;  . BOTOX INJECTION N/A 06/28/2014   Procedure: BOTOX INJECTION;  Surgeon: Ladene Artist, MD;  Location: [facility];  Service: Endoscopy;  Laterality: N/A;  . CATARACT EXTRACTION W/ INTRAOCULAR LENS  IMPLANT, BILATERAL Bilateral 08-2015-10/2015  . ESOPHAGEAL MANOMETRY N/A 10/27/2012   Procedure: ESOPHAGEAL MANOMETRY (EM);  Surgeon: Ladene Artist, MD;  Location: [facility];  Service: Endoscopy;  Laterality: N/A;  . ESOPHAGOGASTRODUODENOSCOPY N/A 07/09/2013   Procedure: ESOPHAGOGASTRODUODENOSCOPY (EGD);  Surgeon: Ladene Artist, MD;  Location: [facility];  Service: Endoscopy;  Laterality: N/A;  . ESOPHAGOGASTRODUODENOSCOPY N/A 10/07/2013   Procedure: ESOPHAGOGASTRODUODENOSCOPY (EGD);  Surgeon: Ladene Artist, MD;  Location: [facility];  Service: Endoscopy;  Laterality: N/A;  wtih botox  . ESOPHAGOGASTRODUODENOSCOPY (EGD) WITH PROPOFOL N/A 06/28/2014   Procedure: ESOPHAGOGASTRODUODENOSCOPY (  EGD) WITH PROPOFOL;  Surgeon: Ladene Artist, MD;  Location: [facility];  Service: Endoscopy;  Laterality: N/A;  . MASTECTOMY Left 1989    OB History    No data available       Home Medications    Prior to Admission medications   Medication Sig Start Date End Date Taking? Authorizing  Provider  acetaminophen (TYLENOL) 500 MG tablet Take 500 mg by mouth every 6 (six) hours as needed for mild pain or moderate pain.    [provider]  alum & mag hydroxide-simeth (MYLANTA) 200-200-20 MG/5ML suspension Take 15 mLs by mouth every 6 (six) hours as needed for indigestion or heartburn.    [provider]  Ascorbic Acid (VITAMIN C PO) Take 1 tablet by mouth every morning.    [provider]  CRANBERRY PO Take 1 tablet by mouth every morning.    [provider]  Cyanocobalamin (VITAMIN B-12 PO) Take 1 tablet by mouth daily.    [provider]  ferrous gluconate (FERGON) 325 MG tablet Take 325 mg by mouth daily with breakfast.      [provider]  fluticasone (FLONASE) 50 MCG/ACT nasal spray Place 2 sprays into both nostrils daily. 11/23/16   Nafziger, [name], NP  lansoprazole (PREVACID SOLUTAB) 30 MG disintegrating tablet Take 1 tablet (30 mg total) by mouth daily. 06/14/14   Hvozdovic, Lori P, PA-C  latanoprost (XALATAN) 0.005 % ophthalmic solution Place 1 drop into both eyes at bedtime.  12/03/14   [provider]  levofloxacin (LEVAQUIN) 500 MG tablet Take 1 tablet (500 mg total) by mouth daily. 12/05/16   Nafziger, [name], NP  LUTEIN PO Take 1 capsule by mouth every morning.    [provider]  mirtazapine (REMERON SOLTAB) 15 MG disintegrating tablet Take 1 tablet (15 mg total) by mouth at bedtime as needed. 03/19/16   Nafziger, [name], NP  Multiple Vitamins-Minerals (ONE-A-DAY WOMENS 50 PLUS PO) Take 1 tablet by mouth every morning.    [provider]  vitamin E 400 UNIT capsule Take 400 Units by mouth daily.    [provider]    Family History Family History  Problem Relation Age of Onset  . Hypertension Mother        family hx  . Lung cancer Father        family hx  . Stroke Unknown        family hx  . Heart disease Sister        family hx    Social History Social History  Substance Use  Topics  . Smoking status: Never Smoker  . Smokeless tobacco: Never Used  . Alcohol use No     Allergies   Septra [bactrim] and Sulfa antibiotics   Review of Systems Review of Systems  A complete review of systems was obtained and all systems are negative except as noted in the HPI and PMH.   Physical Exam Updated Vital Signs BP (!) 97/54 (BP Location: Right Arm)   Pulse 69   Temp 98.5 F (36.9 C) (Oral)   Resp 16   Ht 5\' 2"  (1.575 m)   Wt 49 kg (108 lb)   SpO2 98%   BMI 19.75 kg/m   Physical Exam  Constitutional: She is oriented to person, place, and time. She appears well-developed and well-nourished. No distress.  HENT:  Head: Normocephalic.  Mouth/Throat: Oropharynx is clear and moist.  1 cm occipital full-thickness laceration with no significant bleeding, no underlying crepitance.  Eyes: Pupils are equal, round, and reactive to light. Conjunctivae and EOM are normal.  Neck: Normal range of motion.  Cardiovascular: Normal rate, regular rhythm and intact distal pulses.   Pulmonary/Chest: Effort normal and breath sounds normal.  Abdominal: Soft. There is no tenderness.  Musculoskeletal: Normal range of motion.  No snuffbox tenderness bilaterally, patient is tender to palpation along the right knee medial inferior aspect. Full range of motion to the knee, no abnormal laxity on valgus and varus stress. Anterior and posterior drawer normal.  Patient also tender to palpation along the right olecranon. Full range of motion to elbow can supinate and pronate.    Neurological: She is alert and oriented to person, place, and time.  Follows commands, Clear, goal oriented speech, Strength is 5 out of 5x4 extremities, patient ambulates with a coordinated in nonantalgic gait. Sensation is grossly intact.   Skin: She is not diaphoretic.  Psychiatric: She has a normal mood and affect.  Nursing note and vitals reviewed.    ED Treatments / Results  Labs (all labs ordered are  listed, but only abnormal results are displayed) Labs Reviewed  CBC WITH DIFFERENTIAL/PLATELET - Abnormal; Notable for the following:       Result Value   RBC 3.82 (*)    Hemoglobin 11.1 (*)    HCT 34.8 (*)    All other components within normal limits  BASIC METABOLIC PANEL - Abnormal; Notable for the following:    Potassium 3.4 (*)    Glucose, Bld 137 (*)    Creatinine, Ser 1.27 (*)    GFR calc non Af Amer 36 (*)    GFR calc Af Amer 41 (*)    All other components within normal limits  URINALYSIS, ROUTINE W REFLEX MICROSCOPIC - Abnormal; Notable for the following:    Color, Urine AMBER (*)    APPearance CLOUDY (*)    Hgb urine dipstick SMALL (*)    Protein, ur 100 (*)    Leukocytes, UA LARGE (*)    Bacteria, UA MANY (*)    Squamous Epithelial / LPF 0-5 (*)    All other components within normal limits  URINE CULTURE    EKG  EKG Interpretation None       Radiology No results found.  Procedures .Marland KitchenLaceration Repair Date/Time: 01/25/2017 12:54 AM Performed by: Monico Blitz Authorized by: Monico Blitz   Consent:    Consent obtained:  Verbal   Consent given by:  Patient Laceration details:    Location:  Scalp   Scalp location:  Occipital   Length (cm):  1 Repair type:    Repair type:  Simple Pre-procedure details:    Preparation:  Patient was prepped and draped in usual sterile fashion Exploration:    Contaminated: no   Treatment:    Amount of cleaning:  Standard Skin repair:    Repair method:  Staples   Number of staples:  1 Approximation:    Approximation:  Close   (including critical care time)  Medications Ordered in ED Medications - No data to display   Initial Impression / Assessment and Plan / ED Course  I have reviewed the triage vital signs and the nursing notes.  Pertinent labs & imaging results that were available during my care of the patient were reviewed by me and considered in my medical decision making (see chart for  details).    Vitals:   01/24/17 2114 01/24/17 2117 01/24/17 2323  BP: 119/74  (!) 97/54  Pulse: 72  69  Resp: 16  16  Temp: 97.8 F (36.6 C)  98.5 F (36.9 C)  TempSrc: Oral  Oral  SpO2: 100%  98%  Weight:  49 kg (108 lb)   Height:  5\' 2"  (1.575 m)     Medications - No data to display  Veronica Phillips is 83 y.o. female presenting with Fall while walking into her kitchen with head trauma. Patient is not anticoagulated, neurologic exam is nonfocal. She has a small laceration to the occipital area. Wound is cleaned and closed and chart review shows that this patient's tetanus shot is up-to-date is not due until 2019.  Shared visit with attending physician: Recommends obtaining urine culture but would not treat as this is chronic.  Patient has ambulated in the ED.  Case signed out to PA Joy at shift change: Plan is to follow-up EKG, CT head, CT cervical spine, plain films of the knee and elbow.     Final Clinical Impressions(s) / ED Diagnoses   Final diagnoses:  Laceration of scalp, initial encounter  Fall, initial encounter  Chronic UTI    New Prescriptions New Prescriptions   No medications on file     Waynetta Pean 01/25/17 0059    Ripley Fraise, MD 01/25/17 [phone]

## 2017-01-25 NOTE — ED Provider Notes (Signed)
Veronica Phillips is a 83 y.o. female, with a history of anemia, GERD, breast cancer, arthritis, and HTN, presenting to the ED for evaluation following a fall that occurred the evening of 8/2.   HPI from Robbins, PA-C: "Veronica Phillips is a 83 y.o. female fall earlier in the evening when she was walking into the kitchen to put her sandwich away. She states that she cannot explain exactly why she fell, she does have a history of frequent falls. There was no loss of consciousness, there was no prodrome of chest pain, palpitations, feeling lightheaded or change in vision. She hits the occipital area of her head against a kitchen cabinet handle. She's not anticoagulated. She denies any significant bleeding, headache, change of vision, dysarthria, ataxia, cervicalgia, chest pain, abdominal pain. She denies any dysuria or hematuria, as per her daughter she has a chronic UTI she done multiple antibiotics, she is not currently on antibiotics at this time."  Past Medical History:  Diagnosis Date  . Arthritis    "knees, shoulders" (12/29/2015)  . Atrophic gastritis   . Cancer of left breast ([place]) 1989  . Diverticulosis of colon   . GERD (gastroesophageal reflux disease)   . Glaucoma   . Headache    "awful headaches when I was coming up in my teens"  . Hypertension   . Lactose intolerance   . Pernicious anemia   . Vitamin B 12 deficiency       Physical Exam  BP (!) 97/54 (BP Location: Right Arm)   Pulse 69   Temp 98.5 F (36.9 C) (Oral)   Resp 16   Ht 5\' 2"  (1.575 m)   Wt 49 kg (108 lb)   SpO2 98%   BMI 19.75 kg/m   Physical Exam  Constitutional: She appears well-developed and well-nourished. No distress.  HENT:  Head: Normocephalic.  For further exam results, please see physical exam by Monico Blitz, PA-C.  Eyes: Conjunctivae are normal.  Neck: Neck supple.  Cardiovascular: Normal rate, regular rhythm, normal heart sounds and intact distal pulses.   Pulmonary/Chest: Effort  normal and breath sounds normal. No respiratory distress.  No increased work of breathing. Patient speaks in full sentences without difficulty.  Abdominal: Soft. There is no tenderness. There is no guarding.  Musculoskeletal: She exhibits no edema.  Lymphadenopathy:    She has no cervical adenopathy.  Neurological: She is alert.  Skin: Skin is warm and dry. She is not diaphoretic.  Psychiatric: She has a normal mood and affect. Her behavior is normal.  Nursing note and vitals reviewed.   ED Course  Procedures Results for orders placed or performed during the hospital encounter of 01/24/17  CBC with Differential  Result Value Ref Range   WBC 5.3 4.0 - 10.5 K/uL   RBC 3.82 (L) 3.87 - 5.11 MIL/uL   Hemoglobin 11.1 (L) 12.0 - 15.0 g/dL   HCT 34.8 (L) 36.0 - 46.0 %   MCV 91.1 78.0 - 100.0 fL   MCH 29.1 26.0 - 34.0 pg   MCHC 31.9 30.0 - 36.0 g/dL   RDW 14.5 11.5 - 15.5 %   Platelets 263 150 - 400 K/uL   Neutrophils Relative % 64 %   Neutro Abs 3.4 1.7 - 7.7 K/uL   Lymphocytes Relative 25 %   Lymphs Abs 1.3 0.7 - 4.0 K/uL   Monocytes Relative 11 %   Monocytes Absolute 0.6 0.1 - 1.0 K/uL   Eosinophils Relative 0 %   Eosinophils Absolute  0.0 0.0 - 0.7 K/uL   Basophils Relative 0 %   Basophils Absolute 0.0 0.0 - 0.1 K/uL  Basic metabolic panel  Result Value Ref Range   Sodium 140 135 - 145 mmol/L   Potassium 3.4 (L) 3.5 - 5.1 mmol/L   Chloride 109 101 - 111 mmol/L   CO2 25 22 - 32 mmol/L   Glucose, Bld 137 (H) 65 - 99 mg/dL   BUN 17 6 - 20 mg/dL   Creatinine, Ser 1.27 (H) 0.44 - 1.00 mg/dL   Calcium 8.9 8.9 - 10.3 mg/dL   GFR calc non Af Amer 36 (L) >60 mL/min   GFR calc Af Amer 41 (L) >60 mL/min   Anion gap 6 5 - 15  Urinalysis, Routine w reflex microscopic  Result Value Ref Range   Color, Urine AMBER (A) YELLOW   APPearance CLOUDY (A) CLEAR   Specific Gravity, Urine 1.020 1.005 - 1.030   pH 5.0 5.0 - 8.0   Glucose, UA NEGATIVE NEGATIVE mg/dL   Hgb urine dipstick SMALL  (A) NEGATIVE   Bilirubin Urine NEGATIVE NEGATIVE   Ketones, ur NEGATIVE NEGATIVE mg/dL   Protein, ur 100 (A) NEGATIVE mg/dL   Nitrite NEGATIVE NEGATIVE   Leukocytes, UA LARGE (A) NEGATIVE   RBC / HPF 6-30 0 - 5 RBC/hpf   WBC, UA TOO NUMEROUS TO COUNT 0 - 5 WBC/hpf   Bacteria, UA MANY (A) NONE SEEN   Squamous Epithelial / LPF 0-5 (A) NONE SEEN   WBC Clumps PRESENT    Mucous PRESENT    Hyaline Casts, UA PRESENT    Dg Elbow Complete Right  Result Date: 01/25/2017 CLINICAL DATA:  Right elbow pain after a fall. EXAM: RIGHT ELBOW - COMPLETE 3+ VIEW COMPARISON:  None. FINDINGS: Degenerative spurring in the right elbow. No evidence of acute fracture or dislocation. No focal bone lesion or bone destruction. No significant effusion. Soft tissues are unremarkable. IMPRESSION: Degenerative changes.  No acute bony abnormalities. Electronically Signed   By: Lucienne Capers M.D.   On: 01/25/2017 01:09   Ct Head Wo Contrast  Result Date: 01/25/2017 CLINICAL DATA:  Slip and fall injury, striking head against a drawer handle. Headache. Laceration to the posterior head. EXAM: CT HEAD WITHOUT CONTRAST CT CERVICAL SPINE WITHOUT CONTRAST TECHNIQUE: Multidetector CT imaging of the head and cervical spine was performed following the standard protocol without intravenous contrast. Multiplanar CT image reconstructions of the cervical spine were also generated. COMPARISON:  12/29/2015 FINDINGS: CT HEAD FINDINGS Brain: Mild cerebral atrophy. Patchy low-attenuation changes in the deep white matter consistent with small vessel ischemia. No evidence of acute infarction, hemorrhage, hydrocephalus, extra-axial collection or mass lesion/mass effect. Vascular: Vascular calcifications are demonstrated in the internal carotid arteries and vertebrobasilar arteries. Skull: No depressed skull fractures. Sinuses/Orbits: Paranasal sinuses and mastoid air cells are clear. Other: None. CT CERVICAL SPINE FINDINGS Alignment: Straightening of  usual cervical lordosis is likely due to degenerative change and postoperative change. No change in alignment since previous study. Normal alignment of the facet joints. C1-2 articulation appears intact. Skull base and vertebrae: Skull base appears intact. No vertebral compression deformities. No focal bone lesion or bone destruction. Soft tissues and spinal canal: No prevertebral fluid or swelling. No visible canal hematoma. 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 Degenerative changes throughout the remainder the cervical spine with narrowed disc spaces and endplate hypertrophic changes. Degenerative changes throughout the cervical facet joints. Posterior endplate osteophytes cause effacement of the anterior thecal  sac at multiple levels. Upper chest: There appears to be consolidation in the right lung apex, incompletely included within the field of view. Can't exclude pneumothorax with collapsed lung or consolidated pneumonia. Recommend chest x-ray correlation. Other: Vascular calcifications in the cervical carotid arteries. IMPRESSION: 1. No acute intracranial abnormalities. Diffuse cerebral atrophy and small vessel ischemic changes. 2. Alignment of the cervical spine is unchanged since previous study. 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 Diffuse degenerative change throughout the cervical spine. No acute displaced fractures identified. 3. Consolidation in the right lung apex is incompletely included within field of view. Can't exclude pneumothorax with collapsed lung more consolidated pneumonia. Recommend chest x-ray correlation. These results were called by telephone at the time of interpretation on 01/25/2017 at 1:27 am to Dr. Christy Gentles , who verbally acknowledged these results. Electronically Signed   By: Lucienne Capers M.D.   On: 01/25/2017 01:32   Ct Cervical Spine Wo Contrast  Result Date: 01/25/2017 CLINICAL DATA:  Slip and fall injury, striking head against a  drawer handle. Headache. Laceration to the posterior head. EXAM: CT HEAD WITHOUT CONTRAST CT CERVICAL SPINE WITHOUT CONTRAST TECHNIQUE: Multidetector CT imaging of the head and cervical spine was performed following the standard protocol without intravenous contrast. Multiplanar CT image reconstructions of the cervical spine were also generated. COMPARISON:  12/29/2015 FINDINGS: CT HEAD FINDINGS Brain: Mild cerebral atrophy. Patchy low-attenuation changes in the deep white matter consistent with small vessel ischemia. No evidence of acute infarction, hemorrhage, hydrocephalus, extra-axial collection or mass lesion/mass effect. Vascular: Vascular calcifications are demonstrated in the internal carotid arteries and vertebrobasilar arteries. Skull: No depressed skull fractures. Sinuses/Orbits: Paranasal sinuses and mastoid air cells are clear. Other: None. CT CERVICAL SPINE FINDINGS Alignment: Straightening of usual cervical lordosis is likely due to degenerative change and postoperative change. No change in alignment since previous study. Normal alignment of the facet joints. C1-2 articulation appears intact. Skull base and vertebrae: Skull base appears intact. No vertebral compression deformities. No focal bone lesion or bone destruction. Soft tissues and spinal canal: No prevertebral fluid or swelling. No visible canal hematoma. 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 Degenerative changes throughout the remainder the cervical spine with narrowed disc spaces and endplate hypertrophic changes. Degenerative changes throughout the cervical facet joints. Posterior endplate osteophytes cause effacement of the anterior thecal sac at multiple levels. Upper chest: There appears to be consolidation in the right lung apex, incompletely included within the field of view. Can't exclude pneumothorax with collapsed lung or consolidated pneumonia. Recommend chest x-ray correlation. Other: Vascular  calcifications in the cervical carotid arteries. IMPRESSION: 1. No acute intracranial abnormalities. Diffuse cerebral atrophy and small vessel ischemic changes. 2. Alignment of the cervical spine is unchanged since previous study. 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 Diffuse degenerative change throughout the cervical spine. No acute displaced fractures identified. 3. Consolidation in the right lung apex is incompletely included within field of view. Can't exclude pneumothorax with collapsed lung more consolidated pneumonia. Recommend chest x-ray correlation. These results were called by telephone at the time of interpretation on 01/25/2017 at 1:27 am to Dr. Christy Gentles , who verbally acknowledged these results. Electronically Signed   By: Lucienne Capers M.D.   On: 01/25/2017 01:32   Dg Chest Port 1 View  Result Date: 01/25/2017 CLINICAL DATA:  Patient fell earlier today. No chest complaints. Consolidation in the right apex suggested on CT cervical spine. EXAM: PORTABLE CHEST 1 VIEW COMPARISON:  12/29/2015 FINDINGS: Consolidation and volume loss in  the right apex likely representing atelectasis or pneumonia. Underlying obstructing mass lesion is not excluded and followup PA and lateral chest X-ray is recommended in 3-4 weeks following appropriate clinical therapy to ensure resolution and exclude underlying malignancy. No pneumothorax. No pleural effusions. Left lung is clear and expanded. Cardiac enlargement without vascular congestion. Calcified and tortuous aorta. Degenerative changes in the spine. Surgical clips in the left axilla. IMPRESSION: Consolidation and volume loss in the right apex likely to represent atelectasis or pneumonia. Followup PA and lateral chest X-ray is recommended in 3-4 weeks following appropriate clinical therapy to ensure resolution and exclude underlying malignancy. Left lung is clear. Cardiac enlargement. Electronically Signed   By: Lucienne Capers M.D.   On: 01/25/2017  02:02   Dg Knee Complete 4 Views Right  Result Date: 01/25/2017 CLINICAL DATA:  Right knee pain after a fall. EXAM: RIGHT KNEE - COMPLETE 4+ VIEW COMPARISON:  None. FINDINGS: Diffuse bone demineralization. Degenerative changes in the right knee with medial greater than lateral compartment narrowing and prominent tricompartment osteophyte formation. Near bone-on-bone appearance of the patella femoral joint. No evidence of acute fracture or dislocation. No destructive bone lesions. No significant effusion. Vascular calcifications. IMPRESSION: Prominent degenerative changes in the right knee. No acute bony abnormalities. Electronically Signed   By: Lucienne Capers M.D.   On: 01/25/2017 01:09     MDM   Clinical Course as of Jan 26 239  Fri Jan 25, 2017  0233 Spoke with the patient and her daughter at the bedside to discuss imaging results. Patient admits to recent fatigue and productive cough. Shared decision making regarding admission and antibiotic initiation. Patient and daughter prefer discharge and PCP follow up with PO ABX initiated.  [SJ]    Clinical Course User Index [SJ] Lorayne Bender, PA-C    Patient care handoff report received from [facility], PA-C. Plan: Review imaging and treat accordingly.   Patient's imaging was reviewed. Evidence of possible pneumonia in the right apex. Patient admits to correlating symptoms. Patient is nontoxic appearing, afebrile, not tachycardic, not tachypneic, not hypotensive, maintains SPO2 of 98-100% on room air, and is in no apparent distress. No leukocytosis. Close PCP follow-up. The patient was given instructions for home care as well as return precautions. Patient voices understanding of these instructions, accepts the plan, and is comfortable with discharge.    Findings and plan of care discussed with Ripley Fraise, MD. Dr. Christy Gentles personally evaluated and examined this patient.    Vitals:   01/24/17 2114 01/24/17 2117 01/24/17 2323  01/25/17 0300  BP: 119/74  (!) 97/54 (!) 174/86  Pulse: 72  69 78  Resp: 16  16 15   Temp: 97.8 F (36.6 C)  98.5 F (36.9 C)   TempSrc: Oral  Oral   SpO2: 100%  98% 100%  Weight:  49 kg (108 lb)    Height:  5\' 2"  (1.575 m)          Lorayne Bender, PA-C 01/25/17 0510    Ripley Fraise, MD 01/25/17 [phone]

## 2017-01-25 NOTE — Discharge Instructions (Addendum)
You can return to the emergency department or go to the urgent care of your choice for staple removal in 10 days.  Clean the area with a gentle baby shampoo and you can apply bacitracin or Neosporin directly onto the wound.  Please try to look at the wound daily, if you see any redness, if the pain increases or any swelling around the area please return to the emergency department for recheck.  Your urine culture is pending, you'll receive a phone call if it is abnormal.  Please follow with your primary care doctor and let them know you were seen in the emergency department, will have to obtain records for further evaluation.  Do not hesitate to return to the emergency department for any new or worsening symptoms.  Pneumonia: There was evidence of pneumonia in the right lung. Please take all of your antibiotics until finished!   You may develop abdominal discomfort or diarrhea from the antibiotic.  You may help offset this with probiotics which you can buy or get in yogurt. Do not eat or take the probiotics until 2 hours after your antibiotic.

## 2017-01-27 LAB — URINE CULTURE

## 2017-01-28 ENCOUNTER — Telehealth: Payer: Self-pay | Admitting: Emergency Medicine

## 2017-01-28 NOTE — Telephone Encounter (Signed)
Post ED Visit - Positive Culture Follow-up  Culture report reviewed by antimicrobial stewardship pharmacist:  []  Elenor Quinones, Pharm.D. []  Heide Guile, Pharm.D., BCPS AQ-ID []  Parks Neptune, Pharm.D., BCPS []  Alycia Rossetti, Pharm.D., BCPS []  [place], Pharm.D., BCPS, AAHIVP []  Legrand Como, Pharm.D., BCPS, AAHIVP []  Salome Arnt, PharmD, BCPS []  Dimitri Ped, PharmD, BCPS []  Vincenza Hews, PharmD, BCPS  Positive urine culture Treated with doxycycline, organism sensitive to the same and no further patient follow-up is required at this time.  Hazle Nordmann 01/28/2017, 11:42 AM

## 2017-01-29 ENCOUNTER — Ambulatory Visit (INDEPENDENT_AMBULATORY_CARE_PROVIDER_SITE_OTHER): Payer: Medicare Other | Admitting: Adult Health

## 2017-01-29 VITALS — BP 90/60 | Temp 97.9°F | Ht 62.0 in | Wt 108.0 lb

## 2017-01-29 DIAGNOSIS — J181 Lobar pneumonia, unspecified organism: Secondary | ICD-10-CM | POA: Diagnosis not present

## 2017-01-29 DIAGNOSIS — J189 Pneumonia, unspecified organism: Secondary | ICD-10-CM

## 2017-01-29 DIAGNOSIS — S0101XS Laceration without foreign body of scalp, sequela: Secondary | ICD-10-CM

## 2017-01-29 NOTE — Progress Notes (Signed)
Subjective:    Patient ID: Veronica Phillips, female    DOB: [birth year], 83 y.o.   MRN: [id number]  HPI  83 year old female who presents to the office today for follow up after being seen in the ER 5 days ago for laceration to scalp s/p fall in the kitchen- hitting her head against a kitchen cabinet handle.   In addition to this chest x ray showed consolidation and volume loss in the right apex likely  to represent atelectasis or pneumonia and she was started on a course of doxycycline. Per patient she has no idea that she had pneumonia. Denies fevers, cough, or feeling ill   Today in the office she reports that she has not fallen since the initial accident. She has a single staple in the back of the head. This appears to be healing well. She reports no LOC during the fall. Denies any headache or dizziness currently.    Review of Systems See HPI  Past Medical History:  Diagnosis Date  . Arthritis    "knees, shoulders" (12/29/2015)  . Atrophic gastritis   . Cancer of left breast ([place]) 1989  . Diverticulosis of colon   . GERD (gastroesophageal reflux disease)   . Glaucoma   . Headache    "awful headaches when I was coming up in my teens"  . Hypertension   . Lactose intolerance   . Pernicious anemia   . Vitamin B 12 deficiency     Social History   Social History  . Marital status: Widowed    Spouse name: N/A  . Number of children: N/A  . Years of education: N/A   Occupational History  . Not on file.   Social History Main Topics  . Smoking status: Never Smoker  . Smokeless tobacco: Never Used  . Alcohol use No  . Drug use: No  . Sexual activity: No   Other Topics Concern  . 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 BALLOON DILATION N/A 06/28/2014   Procedure: BALLOON DILATION;  Surgeon: Ladene Artist, MD;  Location: [facility];  Service: Endoscopy;  Laterality: N/A;  .  BOTOX INJECTION N/A 07/09/2013   Procedure: BOTOX INJECTION;  Surgeon: Ladene Artist, MD;  Location: [facility];  Service: Endoscopy;  Laterality: N/A;  . BOTOX INJECTION N/A 06/28/2014   Procedure: BOTOX INJECTION;  Surgeon: Ladene Artist, MD;  Location: [facility];  Service: Endoscopy;  Laterality: N/A;  . CATARACT EXTRACTION W/ INTRAOCULAR LENS  IMPLANT, BILATERAL Bilateral 08-2015-10/2015  . ESOPHAGEAL MANOMETRY N/A 10/27/2012   Procedure: ESOPHAGEAL MANOMETRY (EM);  Surgeon: Ladene Artist, MD;  Location: [facility];  Service: Endoscopy;  Laterality: N/A;  . ESOPHAGOGASTRODUODENOSCOPY N/A 07/09/2013   Procedure: ESOPHAGOGASTRODUODENOSCOPY (EGD);  Surgeon: Ladene Artist, MD;  Location: [facility];  Service: Endoscopy;  Laterality: N/A;  . ESOPHAGOGASTRODUODENOSCOPY N/A 10/07/2013   Procedure: ESOPHAGOGASTRODUODENOSCOPY (EGD);  Surgeon: Ladene Artist, MD;  Location: [facility];  Service: Endoscopy;  Laterality: N/A;  wtih botox  . ESOPHAGOGASTRODUODENOSCOPY (EGD) WITH PROPOFOL N/A 06/28/2014   Procedure: ESOPHAGOGASTRODUODENOSCOPY (EGD) WITH PROPOFOL;  Surgeon: Ladene Artist, MD;  Location: [facility];  Service: Endoscopy;  Laterality: N/A;  . MASTECTOMY Left 1989    Family History  Problem Relation Age of Onset  . Hypertension Mother        family hx  .  Lung cancer Father        family hx  . Stroke Unknown        family hx  . Heart disease Sister        family hx    Allergies  Allergen Reactions  . Septra [Bactrim] Swelling  . Sulfa Antibiotics Swelling    Current Outpatient Prescriptions on File Prior to Visit  Medication Sig Dispense Refill  . acetaminophen (TYLENOL) 500 MG tablet Take 500 mg by mouth every 6 (six) hours as needed for mild pain or moderate pain.    Marland Kitchen alum & mag hydroxide-simeth (MYLANTA) 532-992-42 MG/5ML suspension Take 15 mLs by mouth every 6 (six) hours as needed for indigestion or heartburn.    . Ascorbic Acid (VITAMIN C PO) Take 1 tablet  by mouth every morning.    Marland Kitchen CRANBERRY PO Take 1 tablet by mouth every morning.    . Cyanocobalamin (VITAMIN B-12 PO) Take 1 tablet by mouth daily.    Marland Kitchen doxycycline (VIBRAMYCIN) 100 MG capsule Take 1 capsule (100 mg total) by mouth 2 (two) times daily. 12 capsule 0  . ferrous gluconate (FERGON) 325 MG tablet Take 325 mg by mouth daily with breakfast.      . fluticasone (FLONASE) 50 MCG/ACT nasal spray Place 2 sprays into both nostrils daily. 16 g 6  . lansoprazole (PREVACID SOLUTAB) 30 MG disintegrating tablet Take 1 tablet (30 mg total) by mouth daily. 30 tablet 3  . latanoprost (XALATAN) 0.005 % ophthalmic solution Place 1 drop into both eyes at bedtime.   0  . LUTEIN PO Take 1 capsule by mouth every morning.    . mirtazapine (REMERON SOLTAB) 15 MG disintegrating tablet Take 1 tablet (15 mg total) by mouth at bedtime as needed. 90 tablet 1  . Multiple Vitamins-Minerals (ONE-A-DAY WOMENS 50 PLUS PO) Take 1 tablet by mouth every morning.    . vitamin E 400 UNIT capsule Take 400 Units by mouth daily.     No current facility-administered medications on file prior to visit.     Temp 97.9 F (36.6 C) (Oral)   Ht 5\' 2"  (1.575 m)   Wt 108 lb (49 kg)   BMI 19.75 kg/m       Objective:   Physical Exam  Constitutional: She is oriented to person, place, and time. She appears well-developed and well-nourished. No distress.  Cardiovascular: Normal rate, regular rhythm, normal heart sounds and intact distal pulses.  Exam reveals no gallop and no friction rub.   No murmur heard. Pulmonary/Chest: Effort normal and breath sounds normal. No respiratory distress. She has no wheezes. She has no rales. She exhibits no tenderness.  Neurological: She is alert and oriented to person, place, and time.  Skin: Skin is warm and dry. No rash noted. She is not diaphoretic. No erythema. No pallor.  Single staple in the occipital area of scalp - appears to be healing well   Nursing note and vitals reviewed.       Assessment & Plan:  1. Laceration of occipital scalp, sequela - Follow up at day 10 for removal of staple  - keep an eye on wound for S&S of infection   2. Pneumonia of right upper lobe due to infectious organism ([place]) - Finish antibiotics. Follow up in 3-4 weeks for repeat chest x ray  - DG Chest 2 View; Future  Dorothyann Peng, NP

## 2017-02-08 ENCOUNTER — Ambulatory Visit (INDEPENDENT_AMBULATORY_CARE_PROVIDER_SITE_OTHER): Payer: Medicare Other | Admitting: Adult Health

## 2017-02-08 ENCOUNTER — Encounter: Payer: Self-pay | Admitting: Adult Health

## 2017-02-08 VITALS — BP 116/66 | Temp 97.9°F | Wt 108.0 lb

## 2017-02-08 DIAGNOSIS — Z4802 Encounter for removal of sutures: Secondary | ICD-10-CM | POA: Diagnosis not present

## 2017-02-08 NOTE — Progress Notes (Signed)
Subjective:    Patient ID: Veronica Phillips, female    DOB: [birth year], 83 y.o.   MRN: [id number]  HPI  83 year old female who  has a past medical history of Arthritis; Atrophic gastritis; Cancer of left breast ([place]) (1989); Diverticulosis of colon; GERD (gastroesophageal reflux disease); Glaucoma; Headache; Hypertension; Lactose intolerance; Pernicious anemia; and Vitamin B 12 deficiency. She presents to the office today for removal of a single staple  In her occipital scalp. She has a stable due to fall in the kitchen, where she hit her head on a cabinet handle.    Review of Systems See HPI   Past Medical History:  Diagnosis Date  . Arthritis    "knees, shoulders" (12/29/2015)  . Atrophic gastritis   . Cancer of left breast ([place]) 1989  . Diverticulosis of colon   . GERD (gastroesophageal reflux disease)   . Glaucoma   . Headache    "awful headaches when I was coming up in my teens"  . Hypertension   . Lactose intolerance   . Pernicious anemia   . Vitamin B 12 deficiency     Social History   Social History  . Marital status: Widowed    Spouse name: N/A  . Number of children: N/A  . Years of education: N/A   Occupational History  . Not on file.   Social History Main Topics  . Smoking status: Never Smoker  . Smokeless tobacco: Never Used  . Alcohol use No  . Drug use: No  . Sexual activity: No   Other Topics Concern  . 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 BALLOON DILATION N/A 06/28/2014   Procedure: BALLOON DILATION;  Surgeon: Ladene Artist, MD;  Location: [facility];  Service: Endoscopy;  Laterality: N/A;  . BOTOX INJECTION N/A 07/09/2013   Procedure: BOTOX INJECTION;  Surgeon: Ladene Artist, MD;  [facility];  Service: Endoscopy;  Laterality: N/A;  . BOTOX INJECTION N/A 06/28/2014   Procedure: BOTOX INJECTION;  Surgeon: Ladene Artist, MD;   [facility];  Service: Endoscopy;  Laterality: N/A;  . CATARACT EXTRACTION W/ INTRAOCULAR LENS  IMPLANT, BILATERAL Bilateral 08-2015-10/2015  . ESOPHAGEAL MANOMETRY N/A 10/27/2012   Procedure: ESOPHAGEAL MANOMETRY (EM);  Surgeon: Ladene Artist, MD;  [facility];  Service: Endoscopy;  Laterality: N/A;  . ESOPHAGOGASTRODUODENOSCOPY N/A 07/09/2013   Procedure: ESOPHAGOGASTRODUODENOSCOPY (EGD);  Surgeon: Ladene Artist, MD;  Location: [facility];  Service: Endoscopy;  Laterality: N/A;  . ESOPHAGOGASTRODUODENOSCOPY N/A 10/07/2013   Procedure: ESOPHAGOGASTRODUODENOSCOPY (EGD);  Surgeon: Ladene Artist, MD;  Location: [facility];  Service: Endoscopy;  Laterality: N/A;  wtih botox  . ESOPHAGOGASTRODUODENOSCOPY (EGD) WITH PROPOFOL N/A 06/28/2014   Procedure: ESOPHAGOGASTRODUODENOSCOPY (EGD) WITH PROPOFOL;  Surgeon: Ladene Artist, MD;  [facility];  Service: Endoscopy;  Laterality: N/A;  . MASTECTOMY Left 1989    Family History  Problem Relation Age of Onset  . Hypertension Mother        family hx  . Lung cancer Father        family hx  . Stroke Unknown        family hx  . Heart disease Sister        family hx    Allergies  Allergen Reactions  . Septra [Bactrim] Swelling  . Sulfa Antibiotics Swelling  Current Outpatient Prescriptions on File Prior to Visit  Medication Sig Dispense Refill  . acetaminophen (TYLENOL) 500 MG tablet Take 500 mg by mouth every 6 (six) hours as needed for mild pain or moderate pain.    Marland Kitchen alum & mag hydroxide-simeth (MYLANTA) 736-681-59 MG/5ML suspension Take 15 mLs by mouth every 6 (six) hours as needed for indigestion or heartburn.    . Ascorbic Acid (VITAMIN C PO) Take 1 tablet by mouth every morning.    Marland Kitchen CRANBERRY PO Take 1 tablet by mouth every morning.    . Cyanocobalamin (VITAMIN B-12 PO) Take 1 tablet by mouth daily.    . ferrous gluconate (FERGON) 325 MG tablet Take 325 mg by mouth daily with breakfast.      .  fluticasone (FLONASE) 50 MCG/ACT nasal spray Place 2 sprays into both nostrils daily. 16 g 6  . lansoprazole (PREVACID SOLUTAB) 30 MG disintegrating tablet Take 1 tablet (30 mg total) by mouth daily. 30 tablet 3  . latanoprost (XALATAN) 0.005 % ophthalmic solution Place 1 drop into both eyes at bedtime.   0  . LUTEIN PO Take 1 capsule by mouth every morning.    . mirtazapine (REMERON SOLTAB) 15 MG disintegrating tablet Take 1 tablet (15 mg total) by mouth at bedtime as needed. 90 tablet 1  . Multiple Vitamins-Minerals (ONE-A-DAY WOMENS 50 PLUS PO) Take 1 tablet by mouth every morning.    . vitamin E 400 UNIT capsule Take 400 Units by mouth daily.     No current facility-administered medications on file prior to visit.     BP 116/66 (BP Location: Right Arm)   Temp 97.9 F (36.6 C) (Oral)   Wt 108 lb (49 kg)   BMI 19.75 kg/m      Objective:   Physical Exam  Constitutional: She is oriented to person, place, and time. She appears well-developed and well-nourished. No distress.  Cardiovascular:  No murmur heard. Pulmonary/Chest: Effort normal and breath sounds normal.  Neurological: She is oriented to person, place, and time.  Skin: Skin is warm and dry. No rash noted. She is not diaphoretic. No erythema. No pallor.  Well healed laceration to occipital scalp   Psychiatric: She has a normal mood and affect. Her behavior is normal. Judgment and thought content normal.  Nursing note and vitals reviewed.     Assessment & Plan:  1. Encounter for staple removal - Removed a single staple. Patient tolerated procedure well    Dorothyann Peng, NP

## 2017-03-01 ENCOUNTER — Ambulatory Visit (INDEPENDENT_AMBULATORY_CARE_PROVIDER_SITE_OTHER)
Admission: RE | Admit: 2017-03-01 | Discharge: 2017-03-01 | Disposition: A | Discharge status: Home / Self Care | Payer: Medicare Other | Source: Ambulatory Visit | Attending: Adult Health | Admitting: Adult Health

## 2017-03-01 DIAGNOSIS — I700 Atherosclerosis of aorta: Secondary | ICD-10-CM | POA: Diagnosis not present

## 2017-03-01 DIAGNOSIS — J189 Pneumonia, unspecified organism: Secondary | ICD-10-CM

## 2017-03-01 DIAGNOSIS — J181 Lobar pneumonia, unspecified organism: Secondary | ICD-10-CM | POA: Diagnosis not present

## 2017-03-06 DIAGNOSIS — H401131 Primary open-angle glaucoma, bilateral, mild stage: Secondary | ICD-10-CM | POA: Diagnosis not present

## 2017-03-21 ENCOUNTER — Ambulatory Visit (INDEPENDENT_AMBULATORY_CARE_PROVIDER_SITE_OTHER): Payer: Medicare Other | Admitting: Adult Health

## 2017-03-21 ENCOUNTER — Encounter: Payer: Self-pay | Admitting: Adult Health

## 2017-03-21 ENCOUNTER — Ambulatory Visit (INDEPENDENT_AMBULATORY_CARE_PROVIDER_SITE_OTHER)
Admission: RE | Admit: 2017-03-21 | Discharge: 2017-03-21 | Disposition: A | Discharge status: Home / Self Care | Payer: Medicare Other | Source: Ambulatory Visit | Attending: Adult Health | Admitting: Adult Health

## 2017-03-21 VITALS — BP 124/72 | HR 76 | Temp 98.0°F

## 2017-03-21 DIAGNOSIS — Z23 Encounter for immunization: Secondary | ICD-10-CM | POA: Diagnosis not present

## 2017-03-21 DIAGNOSIS — R2681 Unsteadiness on feet: Secondary | ICD-10-CM

## 2017-03-21 DIAGNOSIS — M25552 Pain in left hip: Secondary | ICD-10-CM

## 2017-03-21 DIAGNOSIS — S79912A Unspecified injury of left hip, initial encounter: Secondary | ICD-10-CM | POA: Diagnosis not present

## 2017-03-21 NOTE — Progress Notes (Signed)
Subjective:    Patient ID: Veronica Phillips, female    DOB: [birth year], 83 y.o.   MRN: [id number]  HPI  83 year old female who  has a past medical history of Arthritis; Atrophic gastritis; Cancer of left breast ([place]) (1989); Diverticulosis of colon; GERD (gastroesophageal reflux disease); Glaucoma; Headache; Hypertension; Lactose intolerance; Pernicious anemia; and Vitamin B 12 deficiency.  She presents to the office today s/p fall at home   Yesterday she was in the bathroom and became unsteady. She felt herself getting ready to fall and was able to lower herself to the floor. This morning when she was in the bathroom, she again felt as though she was unsteady and was going to fall. She reached over to the sink and grabbed onto but still fell onto the floor. She denies hitting head on either incident.   She denies the feeling of syncope and had no LOC   She complains of left hip pain and right arm pain. She reports that she has been up walking around since the falls. Pain in hip is worse with walking.   She denies any loss of ROM   Family reports that she does use her walker at home on most occassions but that she has not been ambulating as much lately.   Review of Systems See HPI   Past Medical History:  Diagnosis Date  . Arthritis    "knees, shoulders" (12/29/2015)  . Atrophic gastritis   . Cancer of left breast ([place]) 1989  . Diverticulosis of colon   . GERD (gastroesophageal reflux disease)   . Glaucoma   . Headache    "awful headaches when I was coming up in my teens"  . Hypertension   . Lactose intolerance   . Pernicious anemia   . Vitamin B 12 deficiency     Social History   Social History  . Marital status: Widowed    Spouse name: N/A  . Number of children: N/A  . Years of education: N/A   Occupational History  . Not on file.   Social History Main Topics  . Smoking status: Never Smoker  . Smokeless tobacco: Never Used  . Alcohol use No  . Drug use: No  .  Sexual activity: No   Other Topics Concern  . 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 BALLOON DILATION N/A 06/28/2014   Procedure: BALLOON DILATION;  Surgeon: Ladene Artist, MD;  [facility];  Service: Endoscopy;  Laterality: N/A;  . BOTOX INJECTION N/A 07/09/2013   Procedure: BOTOX INJECTION;  Surgeon: Ladene Artist, MD;  [facility];  Service: Endoscopy;  Laterality: N/A;  . BOTOX INJECTION N/A 06/28/2014   Procedure: BOTOX INJECTION;  Surgeon: Ladene Artist, MD;  [facility];  Service: Endoscopy;  Laterality: N/A;  . CATARACT EXTRACTION W/ INTRAOCULAR LENS  IMPLANT, BILATERAL Bilateral 08-2015-10/2015  . ESOPHAGEAL MANOMETRY N/A 10/27/2012   Procedure: ESOPHAGEAL MANOMETRY (EM);  Surgeon: Ladene Artist, MD;  [facility];  Service: Endoscopy;  Laterality: N/A;  . ESOPHAGOGASTRODUODENOSCOPY N/A 07/09/2013   Procedure: ESOPHAGOGASTRODUODENOSCOPY (EGD);  Surgeon: Ladene Artist, MD;  Location: [facility];  Service: Endoscopy;  Laterality: N/A;  . ESOPHAGOGASTRODUODENOSCOPY N/A 10/07/2013   Procedure: ESOPHAGOGASTRODUODENOSCOPY (EGD);  Surgeon: Ladene Artist, MD;  Location: [facility];  Service: Endoscopy;  Laterality: N/A;  wtih  botox  . ESOPHAGOGASTRODUODENOSCOPY (EGD) WITH PROPOFOL N/A 06/28/2014   Procedure: ESOPHAGOGASTRODUODENOSCOPY (EGD) WITH PROPOFOL;  Surgeon: Ladene Artist, MD;  [facility];  Service: Endoscopy;  Laterality: N/A;  . MASTECTOMY Left 1989    Family History  Problem Relation Age of Onset  . Hypertension Mother        family hx  . Lung cancer Father        family hx  . Stroke Unknown        family hx  . Heart disease Sister        family hx    Allergies  Allergen Reactions  . Septra [Bactrim] Swelling  . Sulfa Antibiotics Swelling    Current Outpatient Prescriptions on File Prior to Visit    Medication Sig Dispense Refill  . acetaminophen (TYLENOL) 500 MG tablet Take 500 mg by mouth every 6 (six) hours as needed for mild pain or moderate pain.    Marland Kitchen alum & mag hydroxide-simeth (MYLANTA) 742-595-63 MG/5ML suspension Take 15 mLs by mouth every 6 (six) hours as needed for indigestion or heartburn.    . Ascorbic Acid (VITAMIN C PO) Take 1 tablet by mouth every morning.    Marland Kitchen CRANBERRY PO Take 1 tablet by mouth every morning.    . Cyanocobalamin (VITAMIN B-12 PO) Take 1 tablet by mouth daily.    . ferrous gluconate (FERGON) 325 MG tablet Take 325 mg by mouth daily with breakfast.      . fluticasone (FLONASE) 50 MCG/ACT nasal spray Place 2 sprays into both nostrils daily. 16 g 6  . lansoprazole (PREVACID SOLUTAB) 30 MG disintegrating tablet Take 1 tablet (30 mg total) by mouth daily. 30 tablet 3  . latanoprost (XALATAN) 0.005 % ophthalmic solution Place 1 drop into both eyes at bedtime.   0  . LUTEIN PO Take 1 capsule by mouth every morning.    . mirtazapine (REMERON SOLTAB) 15 MG disintegrating tablet Take 1 tablet (15 mg total) by mouth at bedtime as needed. 90 tablet 1  . Multiple Vitamins-Minerals (ONE-A-DAY WOMENS 50 PLUS PO) Take 1 tablet by mouth every morning.    . vitamin E 400 UNIT capsule Take 400 Units by mouth daily.     No current facility-administered medications on file prior to visit.     BP 124/72   Pulse 76   Temp 98 F (36.7 C) (Oral)   SpO2 96%       Objective:   Physical Exam  Constitutional: She is oriented to person, place, and time. She appears well-developed and well-nourished. No distress.  Cardiovascular: Normal rate, regular rhythm, normal heart sounds and intact distal pulses.  Exam reveals no gallop and no friction rub.   No murmur heard. Pulmonary/Chest: Effort normal and breath sounds normal. No respiratory distress. She has no wheezes. She has no rales. She exhibits no tenderness.  Musculoskeletal: She exhibits tenderness. She exhibits no  deformity.  Tenderness to right deltoid with palpation. No bruising or trauma noted. Has full ROM with right arm.   Patient able to stand from wheelchair with assistance. Slight pain around left hip.   Neurological: She is alert and oriented to person, place, and time.  Skin: Skin is warm and dry. No rash noted. She is not diaphoretic. No erythema. No pallor.  Psychiatric: She has a normal mood and affect. Her behavior is normal. Thought content normal.  Vitals reviewed.     Assessment & Plan:  1. Left hip pain - Will get x  ray of left hip. No concern for fracture of right arm, this appears as a muscle strain  - Tylenol as needed for pain relief.  - DG HIP UNILAT WITH PELVIS 2-3 VIEWS LEFT; Future  2. Gait instability  - Ambulatory referral to Physical Therapy   Dorothyann Peng

## 2017-03-21 NOTE — Addendum Note (Signed)
Addended by: Miles Costain T on: 03/21/2017 04:17 PM   Modules accepted: Orders

## 2017-03-21 NOTE — Patient Instructions (Addendum)
Someone will call to schedule physical therapy   It is ok to take tylenol for the pain   I will follow up with you regarding your x ray

## 2017-03-22 ENCOUNTER — Telehealth: Payer: Self-pay | Admitting: Adult Health

## 2017-03-22 NOTE — Telephone Encounter (Signed)
Findings felt to be indicative of a degree of impaction at the subcapital femoral neck on the left. = Fracture

## 2017-03-22 NOTE — Telephone Encounter (Signed)
Spoke to [name] (daughter) and informed her of results.  No referral to ortho at this time.  Will not go to physical therapy for a few weeks.  Will call back if needed.

## 2017-03-22 NOTE — Telephone Encounter (Signed)
FYI  Pt is calling to make sure that the xray report came over in the system

## 2017-03-22 NOTE — Telephone Encounter (Signed)
[name], results are in.  Please review and advise.

## 2017-03-22 NOTE — Telephone Encounter (Signed)
Spoke to Veronica Phillips and advised Veronica Phillips authorized travel but should limit walking.  Veronica Phillips agreed.  No further action needed.  Will close note.

## 2017-03-22 NOTE — Telephone Encounter (Signed)
Yes, that is fine . Try and limit walking as much as possible

## 2017-03-22 NOTE — Telephone Encounter (Signed)
She has a small fracture at the neck of the femur. There really is not anything to do for this besides pain management. I would not do physical therapy for a few weeks so that it can heal. I would be happy to refer to orthopedics but I do not think they will do anything

## 2017-03-22 NOTE — Telephone Encounter (Signed)
Can we call and see what radiology means by their report?

## 2017-03-22 NOTE — Telephone Encounter (Signed)
Pts daughter is calling wanting to know if it is okay for the pt to travel a total of 4 hours (2 hours going and coming)?

## 2017-03-22 NOTE — Telephone Encounter (Signed)
Spoke with Raquel Sarna @ PT and advised of recommendations. She will place pt on a 3 week hold and then re-assess for continued PT. Nothing further needed at this time.

## 2017-06-12 DIAGNOSIS — H401131 Primary open-angle glaucoma, bilateral, mild stage: Secondary | ICD-10-CM | POA: Diagnosis not present

## 2017-07-09 ENCOUNTER — Ambulatory Visit: Payer: Medicare Other | Admitting: Adult Health

## 2017-07-09 ENCOUNTER — Encounter: Payer: Self-pay | Admitting: Adult Health

## 2017-07-09 ENCOUNTER — Ambulatory Visit: Payer: Self-pay

## 2017-07-09 VITALS — BP 134/80 | Temp 97.6°F

## 2017-07-09 DIAGNOSIS — M25511 Pain in right shoulder: Secondary | ICD-10-CM | POA: Diagnosis not present

## 2017-07-09 NOTE — Progress Notes (Signed)
Subjective:    Patient ID: Veronica Phillips, female    DOB: August 10, 1922, 84 y.o.   MRN: [id number]  HPI 84 year old female who  has a past medical history of Arthritis, Atrophic gastritis, Cancer of left breast ([place]) (1989), Diverticulosis of colon, GERD (gastroesophageal reflux disease), Glaucoma, Headache, Hypertension, Lactose intolerance, Pernicious anemia, and Vitamin B 12 deficiency.  She presents to the office today for the complaint of right shoulder pain x 4 months. Reports pain started after flu vaccination but has been becoming progressively worse. Reports pain is worse with ROM and when she tries to use her arm to get out of chair or wheel chair. Has decreased ROM with right arm but does not feel any loss of grip strength.   She denies any recent falls or trauma. She did have a fall in September which caused her to have a small fracture in the left subcapital femoral neck region. She does not believe she hit her right shoulder in the fall.   Review of Systems See HPI   Past Medical History:  Diagnosis Date  . Arthritis    "knees, shoulders" (12/29/2015)  . Atrophic gastritis   . Cancer of left breast ([place]) 1989  . Diverticulosis of colon   . GERD (gastroesophageal reflux disease)   . Glaucoma   . Headache    "awful headaches when I was coming up in my teens"  . Hypertension   . Lactose intolerance   . Pernicious anemia   . Vitamin B 12 deficiency     Social History   Socioeconomic History  . Marital status: Widowed    Spouse name: Not on file  . Number of children: Not on file  . Years of education: Not on file  . Highest education level: Not on file  Social Needs  . Financial resource strain: Not on file  . Food insecurity - worry: Not on file  . Food insecurity - inability: Not on file  . Transportation needs - medical: Not on file  . Transportation needs - non-medical: Not on file  Occupational History  . Not on file  Tobacco Use  . Smoking status: Never  Smoker  . Smokeless tobacco: Never Used  Substance and Sexual Activity  . Alcohol use: No  . Drug use: No  . Sexual activity: No  Other Topics Concern  . 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 BALLOON DILATION N/A 06/28/2014   Procedure: BALLOON DILATION;  Surgeon: Ladene Artist, MD;  [facility];  Service: Endoscopy;  Laterality: N/A;  . BOTOX INJECTION N/A 07/09/2013   Procedure: BOTOX INJECTION;  Surgeon: Ladene Artist, MD;  [facility];  Service: Endoscopy;  Laterality: N/A;  . BOTOX INJECTION N/A 06/28/2014   Procedure: BOTOX INJECTION;  Surgeon: Ladene Artist, MD;  [facility];  Service: Endoscopy;  Laterality: N/A;  . CATARACT EXTRACTION W/ INTRAOCULAR LENS  IMPLANT, BILATERAL Bilateral 08-2015-10/2015  . ESOPHAGEAL MANOMETRY N/A 10/27/2012   Procedure: ESOPHAGEAL MANOMETRY (EM);  Surgeon: Ladene Artist, MD;  [facility];  Service: Endoscopy;  Laterality: N/A;  . ESOPHAGOGASTRODUODENOSCOPY N/A 07/09/2013   Procedure: ESOPHAGOGASTRODUODENOSCOPY (EGD);  Surgeon: Ladene Artist, MD;  Location: [facility];  Service: Endoscopy;  Laterality: N/A;  . ESOPHAGOGASTRODUODENOSCOPY N/A 10/07/2013   Procedure: ESOPHAGOGASTRODUODENOSCOPY (EGD);  Surgeon: Ladene Artist, MD;  [facility];  Service: Endoscopy;  Laterality: N/A;  wtih botox  . ESOPHAGOGASTRODUODENOSCOPY (EGD) WITH PROPOFOL N/A 06/28/2014   Procedure: ESOPHAGOGASTRODUODENOSCOPY (EGD) WITH PROPOFOL;  Surgeon: Ladene Artist, MD;  [facility];  Service: Endoscopy;  Laterality: N/A;  . MASTECTOMY Left 1989    Family History  Problem Relation Age of Onset  . Hypertension Mother        family hx  . Lung cancer Father        family hx  . Stroke Unknown        family hx  . Heart disease Sister        family hx    Allergies  Allergen Reactions  . Septra [Bactrim]  Swelling  . Sulfa Antibiotics Swelling    Current Outpatient Medications on File Prior to Visit  Medication Sig Dispense Refill  . alum & mag hydroxide-simeth (MYLANTA) [phone] MG/5ML suspension Take 15 mLs by mouth every 6 (six) hours as needed for indigestion or heartburn.    . Ascorbic Acid (VITAMIN C PO) Take 1 tablet by mouth every morning.    Marland Kitchen CRANBERRY PO Take 1 tablet by mouth every morning.    . Cyanocobalamin (VITAMIN B-12 PO) Take 1 tablet by mouth daily.    . ferrous gluconate (FERGON) 325 MG tablet Take 325 mg by mouth daily with breakfast.      . fluticasone (FLONASE) 50 MCG/ACT nasal spray Place 2 sprays into both nostrils daily. 16 g 6  . latanoprost (XALATAN) 0.005 % ophthalmic solution Place 1 drop into both eyes at bedtime.   0  . LUTEIN PO Take 1 capsule by mouth every morning.    . Multiple Vitamins-Minerals (ONE-A-DAY WOMENS 50 PLUS PO) Take 1 tablet by mouth every morning.    . vitamin E 400 UNIT capsule Take 400 Units by mouth daily.    Marland Kitchen acetaminophen (TYLENOL) 500 MG tablet Take 500 mg by mouth every 6 (six) hours as needed for mild pain or moderate pain.    . mirtazapine (REMERON SOLTAB) 15 MG disintegrating tablet Take 1 tablet (15 mg total) by mouth at bedtime as needed. (Patient not taking: Reported on 07/09/2017) 90 tablet 1   No current facility-administered medications on file prior to visit.     BP 134/80 (BP Location: Right Arm)   Temp 97.6 F (36.4 C) (Oral)       Objective:   Physical Exam  Constitutional: She is oriented to person, place, and time. She appears well-developed and well-nourished. No distress.  Cardiovascular: Normal rate, regular rhythm, normal heart sounds and intact distal pulses. Exam reveals no gallop and no friction rub.  No murmur heard. Pulmonary/Chest: Effort normal and breath sounds normal. No respiratory distress. She has no wheezes. She has no rales. She exhibits no tenderness.  Musculoskeletal: She exhibits  tenderness. She exhibits no edema.  Has decreased ROM with right shoulder, pain with palpation along ball of humerus and bicep.   Neurological: She is alert and oriented to person, place, and time.  Skin: Skin is warm and dry. No rash noted. She is not diaphoretic. No erythema. No pallor.  Psychiatric: She has a normal mood and affect. Her behavior is normal. Judgment and thought content normal.  Nursing note and vitals reviewed.     Assessment & Plan:  1. Acute pain of right shoulder - concern for rotator cuff injury, possibly from fall. Will send to sports medicine for further evaluation  - Ambulatory referral to Sports Medicine  Dorothyann Peng, NP

## 2017-07-09 NOTE — Telephone Encounter (Signed)
Daughter calling on behalf of pt. C/o right arm pain "ever since she received her Flu shot." daughter says there is no rash, fever. Pt is not wanting to use the arm because of the pain. Appt made for pt with Dorothyann Peng AGNP today at 1730.  Reason for Disposition . [1] MODERATE pain (e.g., interferes with normal activities) AND [2] present > 3 days  Answer Assessment - Initial Assessment Questions 1. ONSET: "When did the pain start?"     Last year daughter unsure of the date 2. LOCATION: "Where is the pain located?"     Right arm  3. PAIN: "How bad is the pain?" (Scale 1-10; or mild, moderate, severe)   - MILD (1-3): doesn't interfere with normal activities   - MODERATE (4-7): interferes with normal activities (e.g., work or school) or awakens from sleep   - SEVERE (8-10): excruciating pain, unable to do any normal activities, unable to hold a cup of water      moderate 4. WORK OR EXERCISE: "Has there been any recent work or exercise that involved this part of the body?"     no 5. CAUSE: "What do you think is causing the arm pain?"     Flu shot- daughter says Mother says ever since the Flu shot she has had pain to the right arm 6. OTHER SYMPTOMS: "Do you have any other symptoms?" (e.g., neck pain, swelling, rash, fever, numbness, weakness)     None of these- not wanting to use the arm 7. PREGNANCY: "Is there any chance you are pregnant?" "When was your last menstrual period?"     n/a  Protocols used: ARM PAIN-A-AH

## 2017-07-12 ENCOUNTER — Ambulatory Visit: Payer: Medicare Other | Admitting: Sports Medicine

## 2017-07-12 ENCOUNTER — Ambulatory Visit: Payer: Self-pay

## 2017-07-12 ENCOUNTER — Encounter: Payer: Self-pay | Admitting: Sports Medicine

## 2017-07-12 VITALS — HR 74 | Ht 62.0 in | Wt 107.8 lb

## 2017-07-12 DIAGNOSIS — N183 Chronic kidney disease, stage 3 unspecified: Secondary | ICD-10-CM

## 2017-07-12 DIAGNOSIS — G8929 Other chronic pain: Secondary | ICD-10-CM | POA: Diagnosis not present

## 2017-07-12 DIAGNOSIS — Z8781 Personal history of (healed) traumatic fracture: Secondary | ICD-10-CM | POA: Insufficient documentation

## 2017-07-12 DIAGNOSIS — M25511 Pain in right shoulder: Secondary | ICD-10-CM | POA: Diagnosis not present

## 2017-07-12 DIAGNOSIS — M7551 Bursitis of right shoulder: Secondary | ICD-10-CM | POA: Insufficient documentation

## 2017-07-12 DIAGNOSIS — M25552 Pain in left hip: Secondary | ICD-10-CM

## 2017-07-12 NOTE — Procedures (Signed)
PROCEDURE NOTE:  Ultrasound Guided: Injection: Right shoulder, Images were obtained and interpreted by myself, Teresa Coombs, DO  Images have been saved and stored to PACS system. Images obtained on: GE S7 Ultrasound machine  ULTRASOUND FINDINGS:  Right shoulder has intact biceps tendon as well as subscapularis and supra today's muscle but there is a significant amount of hypoechoic change on the anterior lateral tracking to the anterior medial shoulder consistent with a subcoracoid bursitis.  DESCRIPTION OF PROCEDURE:  The patient's clinical condition is marked by substantial pain and/or significant functional disability. Other conservative therapy has not provided relief, is contraindicated, or not appropriate. There is a reasonable likelihood that injection will significantly improve the patient's pain and/or functional impairment.  After discussing the risks, benefits and expected outcomes of the injection and all questions were reviewed and answered, the patient wished to undergo the above named procedure. Verbal consent was obtained.  The ultrasound was used to identify the target structure and adjacent neurovascular structures. The skin was then prepped in sterile fashion and the target structure was injected under direct visualization using sterile technique as below:  PREP: Alcohol, Ethel Chloride,  APPROACH: direct, single injection, 25g 1.5in. INJECTATE: 2cc 0.5% marcaine, 2cc 40mg /mL DepoMedrol   ASPIRATE: None   DRESSING: Band-Aid    Post procedural instructions including recommending icing and warning signs for infection were reviewed.  This procedure was well tolerated and there were no complications.   IMPRESSION: Succesful Ultrasound Guided: Injection

## 2017-07-12 NOTE — Assessment & Plan Note (Signed)
Some evidence of supraspinatus tendinopathy with possible interstitial tearing without significant retraction.  Subcoracoid bursitis acutely inflamed and responded well to direct injection.  Follow-up in 6 weeks to ensure clinical resolution and continue with normal day-to-day activities.  Can consider further therapeutic exercises if any lack of improvement she would like to work on normal return to activities.  Low likelihood this is directly related to her influenza vaccine other than may have caused an acute inflammatory response.  This should resolve with injection today.

## 2017-07-12 NOTE — Progress Notes (Signed)
Veronica Phillips. Veronica Phillips, [facility] [place]  Veronica Phillips - 84 y.o. female MRN [id number]  Date of birth: 02-Dec-1922  Visit Date: 07/12/2017  PCP: Dorothyann Peng, NP   Referred by: Dorothyann Peng, NP   Scribe for today's visit: Josepha Pigg, CMA     SUBJECTIVE:  Veronica Phillips is here for New Patient (Initial Visit) (RT shoulder pain) .  Referred by: Dorothyann Peng, NP Her RT shoulder pain symptoms INITIALLY: Began about 4 months ago after receiving influenza vaccination.  Described as severe aching, radiating to RT arm and finger tips get numb.  Worsened with raising the arm Improved with resting the arm Additional associated symptoms include: Pt denies increased swelling, erythema, and warmth in the shoulder or near the injection site.     At this time symptoms are worsening compared to onset , decreased ROM She has been taking Tylenol or Ibuprofen with some relief. She tried applying heat to the area with minimal relief.    ROS Denies night time disturbances. Denies fevers, chills, or night sweats. Denies unexplained weight loss. Reports personal history of cancer. Denies changes in bowel or bladder habits. Reports recent unreported falls. Denies new or worsening dyspnea or wheezing. Denies headaches or dizziness.  Reports numbness, tingling or weakness  In the extremities.  Reports dizziness or presyncopal episodes Denies lower extremity edema     HISTORY & PERTINENT PRIOR DATA:  Prior History reviewed and updated per electronic medical record.  Significant history, findings, studies and interim changes include:  reports that  has never smoked. she has never used smokeless tobacco. No results for input(s): HGBA1C, LABURIC, CREATINE in the last 8760 hours. No specialty comments available. Problem  Left Hip Pain  Subcoracoid Bursitis, Right   Ultrasound-guided injection 07/12/2017     OBJECTIVE:  VS:   HT:5\' 2"  (157.5 cm)   WT:107 lb 12.8 oz (48.9 kg)  BMI:19.71    BP:   HR:74bpm  TEMP: ( )  RESP:98 %   PHYSICAL EXAM: Constitutional: WDWN, Non-toxic appearing. Psychiatric: Alert & appropriately interactive.  Not depressed or anxious appearing. Respiratory: No increased work of breathing.  Trachea Midline Eyes: Pupils are equal.  EOM intact without nystagmus.  No scleral icterus  NEUROVASCULAR exam: No clubbing or cyanosis appreciated No significant venous stasis changes Capillary Refill: normal, less than 2 seconds    UPPER Extremities LOWER Extremities  SWELLING Generalized UE Edema: No significant edema Pre-tibial edema: No significant pretibial edema  PULSES Radial Pulses: Normal & symmetrically palpable Pedal Pulses: Normal & symmetrically palpable  SENSORY Dermatomes intact to light touch Dermatomes intact to light touch  MOTOR Normal strength in all myotomes Normal strength in all myotomes  REFLEXES Reflexes: Normal & symmetric DTRs Reflexes: Normal & symmetric DTRs    Right Shoulder Exam: Normal alignment & Contours, thin, cachectic individual Skin: No overlying erythema/ecchymosis Neck: normal range of motion and supple Axial loading and circumduction produces: No pain or crepitation  Non tender to palpation over: AC joint, deltoid muscle, trapezius muscle TTP over: scapula, deltoid muscle, pectoral muscle, biceps tendon Drop arm test: negative Hawkins: positive, moderate pain  Neers: positive, moderate pain   Internal Rotation: Limited range. with moderate pain Strength: 4/5 External Rotation:  ROM: Normal with moderate pain Strength: 4/5  Empty can: positive, mild pain Strength: 4/5 Speeds: positive, moderate pain Strength: 4/5 O'Briens: positive, moderate pain Strength: 4/5   Left hip exam reveals overall good range of  motion with internal and external rotation of the hip.  No significant pain with axial load and circumduction.  Negative Stinchfield  testing.  Findings:  MSK ultrasound a large degree of subcoracoid bursitis, responded well to injection today.  Overall good improvement in strength and range of motion including overhead lift with injection.  Blood pressure deferred today given pain in the right arm and left arm history    ASSESSMENT & PLAN:   1. Chronic right shoulder pain   2. CKD (chronic kidney disease), stage III ([place])   3. Left hip pain   4. Subcoracoid bursitis, right    PLAN:    Left hip pain Patient did have a subcapital fracture of her left hip back in September.  She has been minimizing her weightbearing and has progressed well and denying any significant pain.  She has good internal and external rotation of her hip at this time.  She is not interested in any type of surgical intervention so further diagnostic evaluation with x-rays will be deferred however percutaneous pinning could be considered for this fracture type.  If any worsening features or any worsening left hip pain repeat x-rays will be indicated at that time.  Subcoracoid bursitis, right Some evidence of supraspinatus tendinopathy with possible interstitial tearing without significant retraction.  Subcoracoid bursitis acutely inflamed and responded well to direct injection.  Follow-up in 6 weeks to ensure clinical resolution and continue with normal day-to-day activities.  Can consider further therapeutic exercises if any lack of improvement she would like to work on normal return to activities.  Low likelihood this is directly related to her influenza vaccine other than may have caused an acute inflammatory response.  This should resolve with injection today.   ++++++++++++++++++++++++++++++++++++++++++++ Orders & Meds: Orders Placed This Encounter  Procedures  . [place] LIMITED JOINT SPACE STRUCTURES UP RIGHT(NO LINKED CHARGES)    No orders of the defined types were placed in this encounter.    ++++++++++++++++++++++++++++++++++++++++++++ Follow-up: Return in about 6 weeks (around 08/23/2017).   Pertinent documentation may be included in additional procedure notes, imaging studies, problem based documentation and patient instructions. Please see these sections of the encounter for additional information regarding this visit. CMA/ATC served as Education administrator during this visit. History, Physical, and Plan performed by medical provider. Documentation and orders reviewed and attested to.      Gerda Diss, [facility] Physician

## 2017-07-12 NOTE — Patient Instructions (Signed)

## 2017-07-12 NOTE — Assessment & Plan Note (Signed)
Patient did have a subcapital fracture of her left hip back in September.  She has been minimizing her weightbearing and has progressed well and denying any significant pain.  She has good internal and external rotation of her hip at this time.  She is not interested in any type of surgical intervention so further diagnostic evaluation with x-rays will be deferred however percutaneous pinning could be considered for this fracture type.  If any worsening features or any worsening left hip pain repeat x-rays will be indicated at that time.

## 2017-07-17 ENCOUNTER — Telehealth: Payer: Self-pay | Admitting: Sports Medicine

## 2017-07-17 ENCOUNTER — Other Ambulatory Visit: Payer: Self-pay

## 2017-07-17 MED ORDER — TRAMADOL HCL 50 MG PO TABS: 50.0000 mg | ORAL_TABLET | Freq: Three times a day (TID) | ORAL | 0 refills | Status: DC | PRN

## 2017-07-17 NOTE — Telephone Encounter (Signed)
Spoke with Veronica Phillips and she advised that pt shoulder pain is worse than when she was here. The pain is more intense. She has been taking Tylenol but not today. She was wondering if something a little stronger could be called in. Per Dr. Paulla Fore, [place] to call in Tramadol 50 mg, take 1 tab po q8h prn, #15/0 refills. Rx called into pharmacy.

## 2017-07-17 NOTE — Telephone Encounter (Signed)
Please see note below. Contact number provided to advise.   Copied from [place] [phone]. Topic: General - Other >> Jul 17, 2017  8:54 AM [name] wrote:  Pt daughter [name]  call to say pt saw Dr Paulla Fore 07/12/17 and received a injection and the daughter call today to say pt is still having the pain and would like a call back   [phone]

## 2017-07-17 NOTE — Telephone Encounter (Signed)

## 2017-08-30 ENCOUNTER — Encounter: Payer: Self-pay | Admitting: Sports Medicine

## 2017-08-30 ENCOUNTER — Ambulatory Visit: Payer: Medicare Other | Admitting: Sports Medicine

## 2017-08-30 VITALS — BP 130/72 | HR 75 | Ht 62.0 in | Wt 100.8 lb

## 2017-08-30 DIAGNOSIS — M7551 Bursitis of right shoulder: Secondary | ICD-10-CM | POA: Diagnosis not present

## 2017-08-30 DIAGNOSIS — Z8781 Personal history of (healed) traumatic fracture: Secondary | ICD-10-CM | POA: Diagnosis not present

## 2017-08-30 DIAGNOSIS — R634 Abnormal weight loss: Secondary | ICD-10-CM

## 2017-08-30 NOTE — Assessment & Plan Note (Signed)
She has had good improvement following last injection.  She was even able to hold a infant yesterday without significant exacerbation in her symptoms.  Given overall steady improvement and reassuring physical exam today further diagnostic evaluation and therapeutic interventions will be deferred.  We did discuss continuing to use her shoulder but avoiding exacerbating activities such as overhead reaching and lifting.  If any exacerbation of her pain I am happy to see her back any time to consider repeat injection and will plan to obtain diagnostic x-rays to evaluate for extent of possible arthritis.

## 2017-08-30 NOTE — Progress Notes (Signed)
Veronica Phillips. Veronica Phillips, [facility]  Veronica Phillips - 84 y.o. female MRN [id number]  Date of birth: 06-21-1923  Visit Date: 08/30/2017  PCP: Dorothyann Peng, NP   Referred by: Dorothyann Peng, NP   Scribe for today's visit: Wendy Poet, LAT, ATC     SUBJECTIVE:  Veronica Phillips is here for Follow-up (R shoulder pain) .   Notes from initial visit on 07/12/17: Her RT shoulder pain symptoms INITIALLY: Began about 4 months ago after receiving influenza vaccination.  Described as severe aching, radiating to RT arm and finger tips get numb.  Worsened with raising the arm Improved with resting the arm Additional associated symptoms include: Pt denies increased swelling, erythema, and warmth in the shoulder or near the injection site.     At this time symptoms are worsening compared to onset , decreased ROM She has been taking Tylenol or Ibuprofen with some relief. She tried applying heat to the area with minimal relief.    Compared to the last office visit on 07/12/17, her previously described R shoulder pain symptoms are improving w/ decreased intensity of pain.  Pt has popping in her R shoulder when she "uses it a lot." Current symptoms are moderate & are nonradiating She has been taking Tylenol and IBU prn.   ROS Denies night time disturbances. Denies fevers, chills, or night sweats. Reports unexplained weight loss. Reports personal history of cancer.  Hx of R breast CA Denies changes in bowel or bladder habits. Reports recent unreported falls. Denies new or worsening dyspnea or wheezing. Denies headaches or dizziness.  Reports numbness, tingling or weakness  In the extremities, in the R fingers. Reports dizziness or presyncopal episodes Denies lower extremity edema    HISTORY & PERTINENT PRIOR DATA:  Prior History reviewed and updated per electronic medical record.  Significant history, findings, studies and  interim changes include:  reports that  has never smoked. she has never used smokeless tobacco. No results for input(s): HGBA1C, LABURIC, CREATINE in the last 8760 hours. No specialty comments available. Problem  Weight Loss  S/P Left Hip Fracture  Subcoracoid Bursitis, Right   Ultrasound-guided injection 07/12/2017     OBJECTIVE:  VS:  HT:5\' 2"  (157.5 cm)   WT:100 lb 12.8 oz (45.7 kg)  BMI:18.43    BP:130/72  HR:75bpm  TEMP: ( )  RESP:94 %   PHYSICAL EXAM: Constitutional: WDWN, Non-toxic appearing. Psychiatric: Alert & appropriately interactive.  Not depressed or anxious appearing. Respiratory: No increased work of breathing.  Trachea Midline Eyes: Pupils are equal.  EOM intact without nystagmus.  No scleral icterus  NEUROVASCULAR exam: No clubbing or cyanosis appreciated No significant venous stasis changes Capillary Refill: normal, less than 2 seconds   Right shoulder: Cachectic female in general with markedly thin arms.  She has no pain with Luan Pulling or with Neer's testing today which is a dramatic improvement compared to the past.  She does have some persistent pain with empty can testing but this is mild and with distraction does not grimace or report pain. Left hip has great internal and external range of motion.  She is minimally mobile at baseline and is in a wheelchair due to her underlying dizziness and difficulty with ambulating.   ASSESSMENT & PLAN:   1. Dizziness   2. Subcoracoid bursitis, right   3. S/p left hip fracture   4. Weight loss    ++++++++++++++++++++++++++++++++++++++++++++ Orders & Meds: No orders of  the defined types were placed in this encounter.  No orders of the defined types were placed in this encounter.   ++++++++++++++++++++++++++++++++++++++++++++ PLAN:    S/p left hip fracture Left hip did have a prior subcapital fracture was treated nonoperatively does have difficulty walking with frequent falls and this is related more however to  orthostasis.  Her exam today is reassuring that this is likely not the biggest issue for her but if persistent falls then obtaining x-rays of this would be recommended but she has moderate internal and external rotation as well as no pain with axial loading of the hip.  Subcoracoid bursitis, right She has had good improvement following last injection.  She was even able to hold a infant yesterday without significant exacerbation in her symptoms.  Given overall steady improvement and reassuring physical exam today further diagnostic evaluation and therapeutic interventions will be deferred.  We did discuss continuing to use her shoulder but avoiding exacerbating activities such as overhead reaching and lifting.  If any exacerbation of her pain I am happy to see her back any time to consider repeat injection and will plan to obtain diagnostic x-rays to evaluate for extent of possible arthritis.   Follow-up: Return if symptoms worsen or fail to improve.   Pertinent documentation may be included in additional procedure notes, imaging studies, problem based documentation and patient instructions. Please see these sections of the encounter for additional information regarding this visit. CMA/ATC served as Education administrator during this visit. History, Physical, and Plan performed by medical provider. Documentation and orders reviewed and attested to.      Gerda Diss, [facility] Physician

## 2017-08-30 NOTE — Assessment & Plan Note (Signed)
Left hip did have a prior subcapital fracture was treated nonoperatively does have difficulty walking with frequent falls and this is related more however to orthostasis.  Her exam today is reassuring that this is likely not the biggest issue for her but if persistent falls then obtaining x-rays of this would be recommended but she has moderate internal and external rotation as well as no pain with axial loading of the hip.

## 2017-09-01 ENCOUNTER — Encounter: Payer: Self-pay | Admitting: Sports Medicine

## 2017-10-09 DIAGNOSIS — H401131 Primary open-angle glaucoma, bilateral, mild stage: Secondary | ICD-10-CM | POA: Diagnosis not present

## 2017-10-16 ENCOUNTER — Ambulatory Visit: Payer: Medicare Other | Admitting: Adult Health

## 2017-10-16 ENCOUNTER — Encounter: Payer: Self-pay | Admitting: Adult Health

## 2017-10-16 VITALS — BP 130/76 | Temp 98.3°F | Wt 101.0 lb

## 2017-10-16 DIAGNOSIS — R600 Localized edema: Secondary | ICD-10-CM | POA: Diagnosis not present

## 2017-10-16 DIAGNOSIS — J302 Other seasonal allergic rhinitis: Secondary | ICD-10-CM

## 2017-10-16 NOTE — Progress Notes (Signed)
Subjective:    Patient ID: Veronica Phillips, female    DOB: [birth year], 84 y.o.   MRN: [id number]  HPI 84 year old female who  has a past medical history of Arthritis, Atrophic gastritis, Cancer of left breast ([place]) (1989), Diverticulosis of colon, GERD (gastroesophageal reflux disease), Glaucoma, Headache, Hypertension, Lactose intolerance, Pernicious anemia, and Vitamin B 12 deficiency.  She presents to the office today for multiple complaints   1. Seasonal Allergies. - Reports over the last week has been experiencing fatigue, itchy watery eyes, and sinus pressure with rhinorrhea. Denies any fevers, nausea, vomiting, or productive cough. She has not been using anything over the counter.   2. Lower extremity edema. - Has been a chronic issue on and off for many years. Her daughter, who is with her today feels as though the swelling has been becoming slightly worse. Patient drinks a lot of juice and has a lot of sodium in her diet. She does not elevate her legs and does not wear compression socks.   Review of Systems See HPI   Past Medical History:  Diagnosis Date  . Arthritis    "knees, shoulders" (12/29/2015)  . Atrophic gastritis   . Cancer of left breast ([place]) 1989  . Diverticulosis of colon   . GERD (gastroesophageal reflux disease)   . Glaucoma   . Headache    "awful headaches when I was coming up in my teens"  . Hypertension   . Lactose intolerance   . Pernicious anemia   . Vitamin B 12 deficiency     Social History   Socioeconomic History  . Marital status: Widowed    Spouse name: Not on file  . Number of children: Not on file  . Years of education: Not on file  . Highest education level: Not on file  Occupational History  . Not on file  Social Needs  . Financial resource strain: Not on file  . Food insecurity:    Worry: Not on file    Inability: Not on file  . Transportation needs:    Medical: Not on file    Non-medical: Not on file  Tobacco Use  . Smoking  status: Never Smoker  . Smokeless tobacco: Never Used  Substance and Sexual Activity  . Alcohol use: No  . Drug use: No  . Sexual activity: Never  Lifestyle  . Physical activity:    Days per week: Not on file    Minutes per session: Not on file  . Stress: Not on file  Relationships  . Social connections:    Talks on phone: Not on file    Gets together: Not on file    Attends religious service: Not on file    Active member of club or organization: Not on file    Attends meetings of clubs or organizations: Not on file    Relationship status: Not on file  . Intimate partner violence:    Fear of current or ex partner: Not on file    Emotionally abused: Not on file    Physically abused: Not on file    Forced sexual activity: Not on file  Other Topics Concern  . 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 BALLOON DILATION N/A 06/28/2014   Procedure: BALLOON DILATION;  Surgeon: Ladene Artist, MD;  Location: [facility];  Service: Endoscopy;  Laterality: N/A;  . BOTOX INJECTION N/A 07/09/2013   Procedure: BOTOX INJECTION;  Surgeon: Ladene Artist, MD;  Location: [facility];  Service: Endoscopy;  Laterality: N/A;  . BOTOX INJECTION N/A 06/28/2014   Procedure: BOTOX INJECTION;  Surgeon: Ladene Artist, MD;  Location: [facility];  Service: Endoscopy;  Laterality: N/A;  . CATARACT EXTRACTION W/ INTRAOCULAR LENS  IMPLANT, BILATERAL Bilateral 08-2015-10/2015  . ESOPHAGEAL MANOMETRY N/A 10/27/2012   Procedure: ESOPHAGEAL MANOMETRY (EM);  Surgeon: Ladene Artist, MD;  Location: [facility];  Service: Endoscopy;  Laterality: N/A;  . ESOPHAGOGASTRODUODENOSCOPY N/A 07/09/2013   Procedure: ESOPHAGOGASTRODUODENOSCOPY (EGD);  Surgeon: Ladene Artist, MD;  Location: [facility];  Service: Endoscopy;  Laterality: N/A;  . ESOPHAGOGASTRODUODENOSCOPY N/A 10/07/2013   Procedure: ESOPHAGOGASTRODUODENOSCOPY (EGD);   Surgeon: Ladene Artist, MD;  Location: [facility];  Service: Endoscopy;  Laterality: N/A;  wtih botox  . ESOPHAGOGASTRODUODENOSCOPY (EGD) WITH PROPOFOL N/A 06/28/2014   Procedure: ESOPHAGOGASTRODUODENOSCOPY (EGD) WITH PROPOFOL;  Surgeon: Ladene Artist, MD;  Location: [facility];  Service: Endoscopy;  Laterality: N/A;  . MASTECTOMY Left 1989    Family History  Problem Relation Age of Onset  . Hypertension Mother        family hx  . Lung cancer Father        family hx  . Stroke Unknown        family hx  . Heart disease Sister        family hx    Allergies  Allergen Reactions  . Septra [Bactrim] Swelling  . Sulfa Antibiotics Swelling    Current Outpatient Medications on File Prior to Visit  Medication Sig Dispense Refill  . acetaminophen (TYLENOL) 500 MG tablet Take 500 mg by mouth every 6 (six) hours as needed for mild pain or moderate pain.    Marland Kitchen alum & mag hydroxide-simeth (MYLANTA) 409-811-91 MG/5ML suspension Take 15 mLs by mouth every 6 (six) hours as needed for indigestion or heartburn.    . Ascorbic Acid (VITAMIN C PO) Take 1 tablet by mouth every morning.    Marland Kitchen CRANBERRY PO Take 1 tablet by mouth every morning.    . Cyanocobalamin (VITAMIN B-12 PO) Take 1 tablet by mouth daily.    . ferrous gluconate (FERGON) 325 MG tablet Take 325 mg by mouth daily with breakfast.      . fluticasone (FLONASE) 50 MCG/ACT nasal spray Place 2 sprays into both nostrils daily. 16 g 6  . latanoprost (XALATAN) 0.005 % ophthalmic solution Place 1 drop into both eyes at bedtime.   0  . LUTEIN PO Take 1 capsule by mouth every morning.    . mirtazapine (REMERON SOLTAB) 15 MG disintegrating tablet Take 1 tablet (15 mg total) by mouth at bedtime as needed. 90 tablet 1  . Multiple Vitamins-Minerals (ONE-A-DAY WOMENS 50 PLUS PO) Take 1 tablet by mouth every morning.    . traMADol (ULTRAM) 50 MG tablet Take 1 tablet (50 mg total) by mouth every 8 (eight) hours as needed. 15 tablet 0  . vitamin E 400  UNIT capsule Take 400 Units by mouth daily.     No current facility-administered medications on file prior to visit.     BP 130/76   Temp 98.3 F (36.8 C) (Oral)   Wt 101 lb (45.8 kg)   BMI 18.47 kg/m       Objective:   Physical Exam  Constitutional: She is oriented to person, place, and time. She appears well-developed and well-nourished. No distress.  HENT:  Head: Normocephalic and atraumatic.  Right Ear: Hearing, tympanic membrane, external ear and ear canal normal.  Left Ear: External ear normal.  Nose: Mucosal edema and rhinorrhea (clear) present. Right sinus exhibits no maxillary sinus tenderness and no frontal sinus tenderness. Left sinus exhibits no maxillary sinus tenderness and no frontal sinus tenderness.  Mouth/Throat: Oropharynx is clear and moist. No oropharyngeal exudate.  + clear PND    Eyes: Pupils are equal, round, and reactive to light. Conjunctivae and EOM are normal. Right eye exhibits no discharge. Left eye exhibits no discharge. No scleral icterus.  Neck: Normal range of motion. Neck supple. No JVD present. No tracheal deviation present. No thyromegaly present.  Cardiovascular: Normal rate, regular rhythm, normal heart sounds and intact distal pulses. Exam reveals no gallop and no friction rub.  No murmur heard. Pulmonary/Chest: Effort normal and breath sounds normal. No stridor. No respiratory distress. She has no wheezes. She has no rales. She exhibits no tenderness.  Musculoskeletal: Normal range of motion. She exhibits edema (non pitting edema noted in bilateral lower extremities ). She exhibits no tenderness or deformity.  Neurological: She is alert and oriented to person, place, and time.  Skin: Skin is warm and dry. No rash noted. She is not diaphoretic. No erythema. No pallor.  Psychiatric: She has a normal mood and affect. Her behavior is normal. Judgment and thought content normal.  Nursing note and vitals reviewed.     Assessment & Plan:  1.  Seasonal allergies - No concern for sinusitis. Advised claritin and flonase  - Follow up if no improvement   2. Lower extremity edema - Encouraged low sodium diet  - Encouraged use of compression socks and to elevate legs above heart when resting   Dorothyann Peng, NP

## 2018-01-14 ENCOUNTER — Other Ambulatory Visit: Payer: Self-pay | Admitting: Family Medicine

## 2018-01-14 ENCOUNTER — Telehealth: Payer: Self-pay | Admitting: Family Medicine

## 2018-01-14 DIAGNOSIS — Z87440 Personal history of urinary (tract) infections: Secondary | ICD-10-CM

## 2018-01-14 DIAGNOSIS — R3915 Urgency of urination: Secondary | ICD-10-CM

## 2018-01-14 NOTE — Telephone Encounter (Signed)
Spoke to [place] and notified her to bring sample.  She states pt has urinary frequency.  Also has a hx of UTI.

## 2018-01-14 NOTE — Telephone Encounter (Signed)
Ok to order 

## 2018-01-14 NOTE — Telephone Encounter (Signed)
That is fine 

## 2018-01-14 NOTE — Telephone Encounter (Signed)
Copied from [place] [phone]. Topic: Quick Communication - See Telephone Encounter >> Jan 14, 2018  9:57 AM Marja Kays F wrote: Pt is prone to having UTI's and her daughter -Opal Sidles would like to bring in a sample or urine to see if pt has a UTI this time and get medications  Best number Opal Sidles [phone]

## 2018-01-15 ENCOUNTER — Other Ambulatory Visit: Payer: Self-pay

## 2018-01-15 ENCOUNTER — Other Ambulatory Visit (INDEPENDENT_AMBULATORY_CARE_PROVIDER_SITE_OTHER): Payer: Medicare Other

## 2018-01-15 DIAGNOSIS — N390 Urinary tract infection, site not specified: Secondary | ICD-10-CM

## 2018-01-15 DIAGNOSIS — Z87440 Personal history of urinary (tract) infections: Secondary | ICD-10-CM

## 2018-01-15 DIAGNOSIS — R3915 Urgency of urination: Secondary | ICD-10-CM

## 2018-01-15 LAB — URINALYSIS, ROUTINE W REFLEX MICROSCOPIC
BILIRUBIN URINE: NEGATIVE
KETONES UR: NEGATIVE
NITRITE: NEGATIVE
Specific Gravity, Urine: 1.015 (ref 1.000–1.030)
TOTAL PROTEIN, URINE-UPE24: 30 — AB
Urine Glucose: NEGATIVE
Urobilinogen, UA: 0.2 (ref 0.0–1.0)
pH: 7 (ref 5.0–8.0)

## 2018-01-15 MED ORDER — CIPROFLOXACIN HCL 500 MG PO TABS: 500.0000 mg | ORAL_TABLET | Freq: Two times a day (BID) | ORAL | 0 refills | Status: DC

## 2018-01-15 NOTE — Telephone Encounter (Signed)
Cipro 500 mg BID x 3 days

## 2018-01-15 NOTE — Addendum Note (Signed)
Addended by: Rene Kocher on: 01/15/2018 03:19 PM   Modules accepted: Orders

## 2018-01-17 LAB — URINE CULTURE
MICRO NUMBER:: 90876119
SPECIMEN QUALITY: ADEQUATE

## 2018-01-23 ENCOUNTER — Encounter: Payer: Self-pay | Admitting: Adult Health

## 2018-01-23 ENCOUNTER — Ambulatory Visit: Payer: Medicare Other | Admitting: Adult Health

## 2018-01-23 VITALS — BP 138/52 | HR 95 | Temp 97.9°F | Wt 105.2 lb

## 2018-01-23 DIAGNOSIS — R251 Tremor, unspecified: Secondary | ICD-10-CM

## 2018-01-23 DIAGNOSIS — Z87440 Personal history of urinary (tract) infections: Secondary | ICD-10-CM | POA: Diagnosis not present

## 2018-01-23 LAB — POC URINALSYSI DIPSTICK (AUTOMATED)
Bilirubin, UA: NEGATIVE
Glucose, UA: NEGATIVE
Ketones, UA: NEGATIVE
Nitrite, UA: NEGATIVE
PROTEIN UA: NEGATIVE
Spec Grav, UA: 1.01 (ref 1.010–1.025)
Urobilinogen, UA: 0.2 E.U./dL
pH, UA: 6 (ref 5.0–8.0)

## 2018-01-23 NOTE — Progress Notes (Signed)
Subjective:    Patient ID: Veronica Phillips, female    DOB: [birth year], 84 y.o.   MRN: [id number]  HPI  84 year old female who  has a past medical history of Arthritis, Atrophic gastritis, Cancer of left breast ([place]) (1989), Diverticulosis of colon, GERD (gastroesophageal reflux disease), Glaucoma, Headache, Hypertension, Lactose intolerance, Pernicious anemia, and Vitamin B 12 deficiency.  She presents to the office today with her daughter for of upper body shaking.  This is been an intermittent problem for an unknown amount of time.  Patient reports that she will times feel "shaky" when getting up out of the chair or walking short distances.  This shaking sensation lasts for 10 to 30 seconds and then resolves.  She does not feel as though she is losing consciousness, denies any chest pain or shortness of breath during the episodes.  On occasion she will feel dizzy momentarily but this also resolves within a few seconds.  Treated for UTI last week her symptoms have resolved but the daughter is unsure if this is UTI related.  Her symptoms were happening prior to her UTI  Review of Systems See HPI   Past Medical History:  Diagnosis Date  . Arthritis    "knees, shoulders" (12/29/2015)  . Atrophic gastritis   . Cancer of left breast ([place]) 1989  . Diverticulosis of colon   . GERD (gastroesophageal reflux disease)   . Glaucoma   . Headache    "awful headaches when I was coming up in my teens"  . Hypertension   . Lactose intolerance   . Pernicious anemia   . Vitamin B 12 deficiency     Social History   Socioeconomic History  . Marital status: Widowed    Spouse name: Not on file  . Number of children: Not on file  . Years of education: Not on file  . Highest education level: Not on file  Occupational History  . Not on file  Social Needs  . Financial resource strain: Not on file  . Food insecurity:    Worry: Not on file    Inability: Not on file  . Transportation needs:   Medical: Not on file    Non-medical: Not on file  Tobacco Use  . Smoking status: Never Smoker  . Smokeless tobacco: Never Used  Substance and Sexual Activity  . Alcohol use: No  . Drug use: No  . Sexual activity: Never  Lifestyle  . Physical activity:    Days per week: Not on file    Minutes per session: Not on file  . Stress: Not on file  Relationships  . Social connections:    Talks on phone: Not on file    Gets together: Not on file    Attends religious service: Not on file    Active member of club or organization: Not on file    Attends meetings of clubs or organizations: Not on file    Relationship status: Not on file  . Intimate partner violence:    Fear of current or ex partner: Not on file    Emotionally abused: Not on file    Physically abused: Not on file    Forced sexual activity: Not on file  Other Topics Concern  . 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 BALLOON DILATION N/A 06/28/2014   Procedure: BALLOON DILATION;  Surgeon: Ladene Artist, MD;  Location: [facility];  Service: Endoscopy;  Laterality: N/A;  . BOTOX INJECTION N/A 07/09/2013   Procedure: BOTOX INJECTION;  Surgeon: Ladene Artist, MD;  Location: [facility];  Service: Endoscopy;  Laterality: N/A;  . BOTOX INJECTION N/A 06/28/2014   Procedure: BOTOX INJECTION;  Surgeon: Ladene Artist, MD;  Location: [facility];  Service: Endoscopy;  Laterality: N/A;  . CATARACT EXTRACTION W/ INTRAOCULAR LENS  IMPLANT, BILATERAL Bilateral 08-2015-10/2015  . ESOPHAGEAL MANOMETRY N/A 10/27/2012   Procedure: ESOPHAGEAL MANOMETRY (EM);  Surgeon: Ladene Artist, MD;  Location: [facility];  Service: Endoscopy;  Laterality: N/A;  . ESOPHAGOGASTRODUODENOSCOPY N/A 07/09/2013   Procedure: ESOPHAGOGASTRODUODENOSCOPY (EGD);  Surgeon: Ladene Artist, MD;  Location: [facility];  Service: Endoscopy;  Laterality: N/A;  .  ESOPHAGOGASTRODUODENOSCOPY N/A 10/07/2013   Procedure: ESOPHAGOGASTRODUODENOSCOPY (EGD);  Surgeon: Ladene Artist, MD;  Location: [facility];  Service: Endoscopy;  Laterality: N/A;  wtih botox  . ESOPHAGOGASTRODUODENOSCOPY (EGD) WITH PROPOFOL N/A 06/28/2014   Procedure: ESOPHAGOGASTRODUODENOSCOPY (EGD) WITH PROPOFOL;  Surgeon: Ladene Artist, MD;  Location: [facility];  Service: Endoscopy;  Laterality: N/A;  . MASTECTOMY Left 1989    Family History  Problem Relation Age of Onset  . Hypertension Mother        family hx  . Lung cancer Father        family hx  . Stroke Unknown        family hx  . Heart disease Sister        family hx    Allergies  Allergen Reactions  . Septra [Bactrim] Swelling  . Sulfa Antibiotics Swelling    Current Outpatient Medications on File Prior to Visit  Medication Sig Dispense Refill  . acetaminophen (TYLENOL) 500 MG tablet Take 500 mg by mouth every 6 (six) hours as needed for mild pain or moderate pain.    Marland Kitchen alum & mag hydroxide-simeth (MYLANTA) 400-867-61 MG/5ML suspension Take 15 mLs by mouth every 6 (six) hours as needed for indigestion or heartburn.    . Ascorbic Acid (VITAMIN C PO) Take 1 tablet by mouth every morning.    . ciprofloxacin (CIPRO) 500 MG tablet Take 1 tablet (500 mg total) by mouth 2 (two) times daily. 6 tablet 0  . CRANBERRY PO Take 1 tablet by mouth every morning.    . Cyanocobalamin (VITAMIN B-12 PO) Take 1 tablet by mouth daily.    . ferrous gluconate (FERGON) 325 MG tablet Take 325 mg by mouth daily with breakfast.      . fluticasone (FLONASE) 50 MCG/ACT nasal spray Place 2 sprays into both nostrils daily. 16 g 6  . latanoprost (XALATAN) 0.005 % ophthalmic solution Place 1 drop into both eyes at bedtime.   0  . LUTEIN PO Take 1 capsule by mouth every morning.    . mirtazapine (REMERON SOLTAB) 15 MG disintegrating tablet Take 1 tablet (15 mg total) by mouth at bedtime as needed. 90 tablet 1  . Multiple Vitamins-Minerals  (ONE-A-DAY WOMENS 50 PLUS PO) Take 1 tablet by mouth every morning.    . traMADol (ULTRAM) 50 MG tablet Take 1 tablet (50 mg total) by mouth every 8 (eight) hours as needed. 15 tablet 0  . vitamin E 400 UNIT capsule Take 400 Units by mouth daily.     No current facility-administered medications on file prior to visit.     BP (!) 138/52 (BP Location: Right Arm, Patient Position: Sitting, Cuff Size: Normal)   Pulse  95   Temp 97.9 F (36.6 C) (Oral)   Wt 105 lb 3.2 oz (47.7 kg)   SpO2 99%   BMI 19.24 kg/m       Objective:   Physical Exam  Constitutional: She is oriented to person, place, and time. She appears well-developed and well-nourished. No distress.  Cardiovascular: Normal rate and regular rhythm.  Pulmonary/Chest: Effort normal and breath sounds normal.  Musculoskeletal:  Chronic weakness in right upper extremity  Neurological: She is alert and oriented to person, place, and time. She displays no atrophy, no tremor and normal reflexes. No cranial nerve deficit or sensory deficit. She exhibits normal muscle tone. She displays no seizure activity. Coordination abnormal.  Skin: Skin is warm and dry. She is not diaphoretic.  Psychiatric: She has a normal mood and affect. Her behavior is normal. Thought content normal.  Nursing note and vitals reviewed.     Assessment & Plan:  1. Tremor -No tremor noted today.  She was able to walk along the hallway back and forth without any symptoms.  She did this without a cane and had slow steady gait.  No signs of Parkinson's disease.  The way she describes it it almost appears as though this is more of a muscle contraction, possibly due to chronic weakness and atrophy.  I will have the daughter forward the episodes when they happen at home we will get further understanding of these possible tremors.  Eyes the patient to stay well-hydrated and can start doing some home chair exercises  2. History of UTI  - POCT Urinalysis Dipstick  (Automated) positive leuks but no nitrates.  Leuks likely from not cleaning appropriately.  She has no symptoms of a UTI  Dorothyann Peng, NP

## 2018-02-05 DIAGNOSIS — H11441 Conjunctival cysts, right eye: Secondary | ICD-10-CM | POA: Diagnosis not present

## 2018-02-05 DIAGNOSIS — H401131 Primary open-angle glaucoma, bilateral, mild stage: Secondary | ICD-10-CM | POA: Diagnosis not present

## 2018-02-27 ENCOUNTER — Ambulatory Visit (INDEPENDENT_AMBULATORY_CARE_PROVIDER_SITE_OTHER): Payer: Self-pay

## 2018-02-27 ENCOUNTER — Telehealth: Payer: Self-pay | Admitting: Family Medicine

## 2018-02-27 ENCOUNTER — Encounter (INDEPENDENT_AMBULATORY_CARE_PROVIDER_SITE_OTHER): Payer: Self-pay | Admitting: Orthopaedic Surgery

## 2018-02-27 ENCOUNTER — Ambulatory Visit (INDEPENDENT_AMBULATORY_CARE_PROVIDER_SITE_OTHER): Payer: Medicare Other | Admitting: Orthopaedic Surgery

## 2018-02-27 VITALS — BP 119/61 | HR 73 | Ht 61.0 in | Wt 105.0 lb

## 2018-02-27 DIAGNOSIS — M25561 Pain in right knee: Secondary | ICD-10-CM

## 2018-02-27 DIAGNOSIS — R2681 Unsteadiness on feet: Secondary | ICD-10-CM

## 2018-02-27 DIAGNOSIS — M1712 Unilateral primary osteoarthritis, left knee: Secondary | ICD-10-CM

## 2018-02-27 DIAGNOSIS — M25562 Pain in left knee: Secondary | ICD-10-CM

## 2018-02-27 DIAGNOSIS — M1711 Unilateral primary osteoarthritis, right knee: Secondary | ICD-10-CM

## 2018-02-27 DIAGNOSIS — M170 Bilateral primary osteoarthritis of knee: Secondary | ICD-10-CM

## 2018-02-27 MED ORDER — BUPIVACAINE HCL 0.5 % IJ SOLN
2.0000 mL | INTRAMUSCULAR | Status: AC | PRN
  Administered 2018-02-27: 2 mL via INTRA_ARTICULAR

## 2018-02-27 MED ORDER — LIDOCAINE HCL 1 % IJ SOLN
2.0000 mL | INTRAMUSCULAR | Status: AC | PRN
  Administered 2018-02-27: 2 mL

## 2018-02-27 MED ORDER — METHYLPREDNISOLONE ACETATE 40 MG/ML IJ SUSP
40.0000 mg | INTRAMUSCULAR | Status: AC | PRN
  Administered 2018-02-27: 40 mg via INTRA_ARTICULAR

## 2018-02-27 NOTE — Progress Notes (Signed)
Office Visit Note   Patient: Veronica Phillips           Date of Birth: October 06, 1922           MRN: [id number] Visit Date: 02/27/2018              Requested by: Veronica Peng, NP [place], [place] [address] PCP: Veronica Peng, NP   Assessment & Plan: Visit Diagnoses:  1. Acute pain of left knee   2. Acute pain of right knee   3. Bilateral primary osteoarthritis of knee     Plan: End-stage osteoarthritis both knees.  Long discussion with Veronica Phillips and family.  Will inject both knees with cortisone and follow-up as needed.  She will receive therapy through her primary care physician.  Follow-Up Instructions: Return if symptoms worsen or fail to improve.   Orders:  Orders Placed This Encounter  Procedures  . Large Joint Inj: bilateral knee  . XR KNEE 3 VIEW RIGHT  . XR KNEE 3 VIEW LEFT   No orders of the defined types were placed in this encounter.     Procedures: Large Joint Inj: bilateral knee on 02/27/2018 4:19 PM Indications: diagnostic evaluation Details: 25 G 1.5 in needle, anteromedial approach  Arthrogram: No  Medications (Right): 2 mL lidocaine 1 %; 2 mL bupivacaine 0.5 %; 40 mg methylPREDNISolone acetate 40 MG/ML Medications (Left): 2 mL lidocaine 1 %; 2 mL bupivacaine 0.5 %; 40 mg methylPREDNISolone acetate 40 MG/ML Outcome: tolerated well, no immediate complications Consent was given by the patient. Immediately prior to procedure a time out was called to verify the correct patient, procedure, equipment, support staff and site/side marked as required. Patient was prepped and draped in the usual sterile fashion.       Clinical Data: No additional findings.   Subjective: Chief Complaint  Patient presents with  . New Patient (Initial Visit)    BI LAT KNEE PAIN FROM FALL 03/03/18    Veronica Phillips is 84 years old accompanied by family members for evaluation of bilateral knee pain.  Her problem is been chronic over many years.  She is reached a  point where she is weak and having difficulty extending both of her knees.  She spends a good part of her time in a wheelchair.  She does have a walker at home. she was recently evaluated by her primary care physician was ordered physical therapy.  She has tried over-the-counter medicines but feels like she is having considerable compromise of her activities. HPI  Review of Systems  Constitutional: Negative for fatigue and fever.  HENT: Negative for ear pain.   Eyes: Negative for pain.  Respiratory: Negative for cough and shortness of breath.   Cardiovascular: Positive for leg swelling.  Gastrointestinal: Negative for constipation and diarrhea.  Genitourinary: Negative for difficulty urinating.  Musculoskeletal: Positive for neck pain. Negative for back pain.  Skin: Negative for rash.  Neurological: Positive for weakness.  Psychiatric/Behavioral: Positive for sleep disturbance.     Objective: Vital Signs: BP 119/61 (BP Location: Right Arm, Patient Position: Sitting, Cuff Size: Normal)   Pulse 73   Ht 5\' 1"  (1.549 m)   Wt 105 lb (47.6 kg)   BMI 19.84 kg/m   Physical Exam  Constitutional: She is oriented to person, place, and time. She appears well-developed and well-nourished.  HENT:  Mouth/Throat: Oropharynx is clear and moist.  Eyes: Pupils are equal, round, and reactive to light. EOM are normal.  Pulmonary/Chest: Effort normal.  Neurological: She is alert and oriented to person, place, and time.  Skin: Skin is warm and dry.  Psychiatric: She has a normal mood and affect. Her behavior is normal.    Ortho Exam awake alert and oriented x3.  Comfortable in a wheelchair.  Lacks about 15 degrees to full extension both knees.  Obvious quad weakness.  Hypertrophic changes about both knees with increased varus consistent with her diagnosis of end-stage osteoarthritis.  Predominately medial joint pain.  Positive patellar crepitation.  No calf pain.  +1 pulses distally good good capillary  refill to toes.  No distal edema.  Straight leg raise negative  Specialty Comments:  No specialty comments available.  Imaging: Xr Knee 3 View Left  Result Date: 02/27/2018 Films of the left knee obtained in 3 projections standing.  There are end-stage osteoarthritic changes in all 3 compartments.  The films on the left knee are worse than on the right.  No ectopic calcification.  Xr Knee 3 View Right  Result Date: 02/27/2018 Of the right knee were obtained in 3 projections standing.  There are end-stage osteoarthritic changes in all 3 compartments.  There is approximately 4 to 5 degrees of varus.  No ectopic calcification.  Large osteophytes about the patellofemoral joint as well as medially and laterally.  Some demineralization of the bone consistent with at least osteopenia.    PMFS History: Patient Active Problem List   Diagnosis Date Noted  . Bilateral primary osteoarthritis of knee 02/27/2018  . Weight loss 08/30/2017  . S/p left hip fracture 07/12/2017  . Subcoracoid bursitis, right 07/12/2017  . CKD (chronic kidney disease), stage III ([place]) 12/29/2015  . Dizziness 12/29/2015  . UTI (lower urinary tract infection) 12/29/2015  . Orthostatic syncope 12/29/2015  . Fall   . Chronic anemia   . Need for prophylactic vaccination against Streptococcus pneumoniae (pneumococcus) 01/06/2015  . Precordial chest pain 11/05/2014  . Dysphagia, pharyngoesophageal phase   . 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 Achalasia 07/07/2013  . Peripheral edema 05/19/2012  . Glaucoma 07/17/2010  . GERD 07/17/2010  . OTHER DYSPHAGIA 07/17/2010  . PROBLEMS WITH SWALLOWING AND MASTICATION 07/11/2010  . IDIOPATHIC URTICARIA 04/11/2010  . ANEMIA, B12 DEFICIENCY 05/18/2008  . OSTEOARTHRITIS 05/12/2007  . DIVERTICULOSIS, COLON 02/26/2007   Past Medical History:  Diagnosis Date  . Arthritis    "knees, shoulders" (12/29/2015)  . Atrophic gastritis   . Cancer of left breast ([place]) 1989  .  Diverticulosis of colon   . GERD (gastroesophageal reflux disease)   . Glaucoma   . Headache    "awful headaches when I was coming up in my teens"  . Hypertension   . Lactose intolerance   . Pernicious anemia   . Vitamin B 12 deficiency     Family History  Problem Relation Age of Onset  . Hypertension Mother        family hx  . Lung cancer Father        family hx  . Stroke Unknown        family hx  . Heart disease Sister        family hx    Past Surgical History:  Procedure Laterality Date  . BALLOON DILATION N/A 06/28/2014   Procedure: BALLOON DILATION;  Surgeon: Ladene Artist, MD;  Location: [facility];  Service: Endoscopy;  Laterality: N/A;  . BOTOX INJECTION N/A 07/09/2013   Procedure: BOTOX INJECTION;  Surgeon: Ladene Artist, MD;  Location: [facility];  Service: Endoscopy;  Laterality: N/A;  . BOTOX INJECTION N/A 06/28/2014   Procedure: BOTOX INJECTION;  Surgeon: Ladene Artist, MD;  Location: [facility];  Service: Endoscopy;  Laterality: N/A;  . CATARACT EXTRACTION W/ INTRAOCULAR LENS  IMPLANT, BILATERAL Bilateral 08-2015-10/2015  . ESOPHAGEAL MANOMETRY N/A 10/27/2012   Procedure: ESOPHAGEAL MANOMETRY (EM);  Surgeon: Ladene Artist, MD;  Location: [facility];  Service: Endoscopy;  Laterality: N/A;  . ESOPHAGOGASTRODUODENOSCOPY N/A 07/09/2013   Procedure: ESOPHAGOGASTRODUODENOSCOPY (EGD);  Surgeon: Ladene Artist, MD;  Location: [facility];  Service: Endoscopy;  Laterality: N/A;  . ESOPHAGOGASTRODUODENOSCOPY N/A 10/07/2013   Procedure: ESOPHAGOGASTRODUODENOSCOPY (EGD);  Surgeon: Ladene Artist, MD;  Location: [facility];  Service: Endoscopy;  Laterality: N/A;  wtih botox  . ESOPHAGOGASTRODUODENOSCOPY (EGD) WITH PROPOFOL N/A 06/28/2014   Procedure: ESOPHAGOGASTRODUODENOSCOPY (EGD) WITH PROPOFOL;  Surgeon: Ladene Artist, MD;  Location: [facility];  Service: Endoscopy;  Laterality: N/A;  . MASTECTOMY Left 1989   Social History   Occupational History  . Not on  file  Tobacco Use  . Smoking status: Never Smoker  . Smokeless tobacco: Never Used  Substance and Sexual Activity  . Alcohol use: No  . Drug use: No  . Sexual activity: Never

## 2018-02-27 NOTE — Telephone Encounter (Signed)
Ok for PT referral 

## 2018-02-27 NOTE — Telephone Encounter (Signed)
Copied from [place] [id number]. Topic: Referral - Request >> Feb 27, 2018 10:22 AM Synthia Innocent wrote: Reason for CRM: Requesting referral for physical therapy, states that [facility] had recommended prior and would like to move forward. Patient is having trouble getting around

## 2018-02-27 NOTE — Telephone Encounter (Signed)
Referral placed in epic.

## 2018-03-04 NOTE — Telephone Encounter (Signed)
Patient daughter checking status of referral. CB # [phone]

## 2018-03-10 ENCOUNTER — Other Ambulatory Visit: Payer: Self-pay

## 2018-03-10 ENCOUNTER — Ambulatory Visit: Payer: Self-pay

## 2018-03-10 ENCOUNTER — Encounter: Payer: Self-pay | Admitting: Physician Assistant

## 2018-03-10 ENCOUNTER — Ambulatory Visit: Payer: Medicare Other | Admitting: Physical Therapy

## 2018-03-10 ENCOUNTER — Ambulatory Visit (INDEPENDENT_AMBULATORY_CARE_PROVIDER_SITE_OTHER): Payer: Medicare Other | Admitting: Physician Assistant

## 2018-03-10 ENCOUNTER — Encounter (HOSPITAL_COMMUNITY): Payer: Self-pay | Admitting: Emergency Medicine

## 2018-03-10 ENCOUNTER — Emergency Department (HOSPITAL_COMMUNITY): Payer: Medicare Other

## 2018-03-10 ENCOUNTER — Observation Stay (HOSPITAL_COMMUNITY)
Admission: EM | Admit: 2018-03-10 | Discharge: 2018-03-13 | Disposition: A | Discharge status: Home / Self Care | Payer: Medicare Other | Attending: Internal Medicine | Admitting: Internal Medicine

## 2018-03-10 VITALS — BP 100/60 | HR 76

## 2018-03-10 DIAGNOSIS — R5383 Other fatigue: Secondary | ICD-10-CM | POA: Diagnosis not present

## 2018-03-10 DIAGNOSIS — Z9841 Cataract extraction status, right eye: Secondary | ICD-10-CM | POA: Diagnosis not present

## 2018-03-10 DIAGNOSIS — Z9012 Acquired absence of left breast and nipple: Secondary | ICD-10-CM | POA: Insufficient documentation

## 2018-03-10 DIAGNOSIS — E538 Deficiency of other specified B group vitamins: Secondary | ICD-10-CM | POA: Diagnosis not present

## 2018-03-10 DIAGNOSIS — Z79899 Other long term (current) drug therapy: Secondary | ICD-10-CM | POA: Insufficient documentation

## 2018-03-10 DIAGNOSIS — H409 Unspecified glaucoma: Secondary | ICD-10-CM | POA: Insufficient documentation

## 2018-03-10 DIAGNOSIS — R42 Dizziness and giddiness: Secondary | ICD-10-CM

## 2018-03-10 DIAGNOSIS — B961 Klebsiella pneumoniae [K. pneumoniae] as the cause of diseases classified elsewhere: Secondary | ICD-10-CM | POA: Diagnosis not present

## 2018-03-10 DIAGNOSIS — Z9889 Other specified postprocedural states: Secondary | ICD-10-CM | POA: Insufficient documentation

## 2018-03-10 DIAGNOSIS — Z882 Allergy status to sulfonamides status: Secondary | ICD-10-CM | POA: Insufficient documentation

## 2018-03-10 DIAGNOSIS — Z9842 Cataract extraction status, left eye: Secondary | ICD-10-CM | POA: Diagnosis not present

## 2018-03-10 DIAGNOSIS — N390 Urinary tract infection, site not specified: Secondary | ICD-10-CM | POA: Diagnosis present

## 2018-03-10 DIAGNOSIS — D518 Other vitamin B12 deficiency anemias: Secondary | ICD-10-CM | POA: Diagnosis present

## 2018-03-10 DIAGNOSIS — W19XXXA Unspecified fall, initial encounter: Secondary | ICD-10-CM

## 2018-03-10 DIAGNOSIS — Z823 Family history of stroke: Secondary | ICD-10-CM | POA: Insufficient documentation

## 2018-03-10 DIAGNOSIS — E739 Lactose intolerance, unspecified: Secondary | ICD-10-CM | POA: Insufficient documentation

## 2018-03-10 DIAGNOSIS — I959 Hypotension, unspecified: Secondary | ICD-10-CM

## 2018-03-10 DIAGNOSIS — N3000 Acute cystitis without hematuria: Secondary | ICD-10-CM

## 2018-03-10 DIAGNOSIS — M1990 Unspecified osteoarthritis, unspecified site: Secondary | ICD-10-CM | POA: Insufficient documentation

## 2018-03-10 DIAGNOSIS — Z23 Encounter for immunization: Secondary | ICD-10-CM | POA: Diagnosis not present

## 2018-03-10 DIAGNOSIS — Z881 Allergy status to other antibiotic agents status: Secondary | ICD-10-CM | POA: Insufficient documentation

## 2018-03-10 DIAGNOSIS — G9340 Encephalopathy, unspecified: Secondary | ICD-10-CM | POA: Diagnosis present

## 2018-03-10 DIAGNOSIS — M6281 Muscle weakness (generalized): Secondary | ICD-10-CM

## 2018-03-10 DIAGNOSIS — G92 Toxic encephalopathy: Secondary | ICD-10-CM | POA: Diagnosis not present

## 2018-03-10 DIAGNOSIS — R2689 Other abnormalities of gait and mobility: Secondary | ICD-10-CM

## 2018-03-10 DIAGNOSIS — N179 Acute kidney failure, unspecified: Secondary | ICD-10-CM

## 2018-03-10 DIAGNOSIS — Z888 Allergy status to other drugs, medicaments and biological substances status: Secondary | ICD-10-CM | POA: Diagnosis not present

## 2018-03-10 DIAGNOSIS — I129 Hypertensive chronic kidney disease with stage 1 through stage 4 chronic kidney disease, or unspecified chronic kidney disease: Secondary | ICD-10-CM | POA: Diagnosis not present

## 2018-03-10 DIAGNOSIS — Z853 Personal history of malignant neoplasm of breast: Secondary | ICD-10-CM | POA: Insufficient documentation

## 2018-03-10 DIAGNOSIS — K219 Gastro-esophageal reflux disease without esophagitis: Secondary | ICD-10-CM | POA: Diagnosis not present

## 2018-03-10 DIAGNOSIS — Z7951 Long term (current) use of inhaled steroids: Secondary | ICD-10-CM | POA: Diagnosis not present

## 2018-03-10 DIAGNOSIS — Z9181 History of falling: Secondary | ICD-10-CM | POA: Insufficient documentation

## 2018-03-10 DIAGNOSIS — Z8249 Family history of ischemic heart disease and other diseases of the circulatory system: Secondary | ICD-10-CM | POA: Insufficient documentation

## 2018-03-10 DIAGNOSIS — N183 Chronic kidney disease, stage 3 unspecified: Secondary | ICD-10-CM | POA: Diagnosis present

## 2018-03-10 DIAGNOSIS — R531 Weakness: Secondary | ICD-10-CM

## 2018-03-10 DIAGNOSIS — I951 Orthostatic hypotension: Secondary | ICD-10-CM | POA: Diagnosis present

## 2018-03-10 DIAGNOSIS — Z1611 Resistance to penicillins: Secondary | ICD-10-CM | POA: Diagnosis not present

## 2018-03-10 DIAGNOSIS — G9389 Other specified disorders of brain: Secondary | ICD-10-CM | POA: Diagnosis not present

## 2018-03-10 HISTORY — DX: Chronic kidney disease, stage 3 unspecified: N18.30

## 2018-03-10 HISTORY — DX: Chronic kidney disease, stage 3 (moderate): N18.3

## 2018-03-10 LAB — COMPREHENSIVE METABOLIC PANEL
ALBUMIN: 3.4 g/dL — AB (ref 3.5–5.0)
ALK PHOS: 64 U/L (ref 38–126)
ALT: 15 U/L (ref 0–44)
ANION GAP: 12 (ref 5–15)
AST: 22 U/L (ref 15–41)
BILIRUBIN TOTAL: 0.9 mg/dL (ref 0.3–1.2)
BUN: 22 mg/dL (ref 8–23)
CALCIUM: 9 mg/dL (ref 8.9–10.3)
CO2: 26 mmol/L (ref 22–32)
Chloride: 104 mmol/L (ref 98–111)
Creatinine, Ser: 1.66 mg/dL — ABNORMAL HIGH (ref 0.44–1.00)
GFR calc non Af Amer: 26 mL/min — ABNORMAL LOW (ref >60)
GFR, EST AFRICAN AMERICAN: 30 mL/min — AB (ref >60)
GLUCOSE: 96 mg/dL (ref 70–99)
Potassium: 3.4 mmol/L — ABNORMAL LOW (ref 3.5–5.1)
Sodium: 142 mmol/L (ref 135–145)
TOTAL PROTEIN: 6.6 g/dL (ref 6.5–8.1)

## 2018-03-10 LAB — CBC WITH DIFFERENTIAL/PLATELET
ABS IMMATURE GRANULOCYTES: 0 10*3/uL (ref 0.0–0.1)
BASOS ABS: 0 10*3/uL (ref 0.0–0.1)
BASOS PCT: 0 %
Eosinophils Absolute: 0 10*3/uL (ref 0.0–0.7)
Eosinophils Relative: 0 %
HCT: 34.6 % — ABNORMAL LOW (ref 36.0–46.0)
Hemoglobin: 10.9 g/dL — ABNORMAL LOW (ref 12.0–15.0)
Immature Granulocytes: 1 %
Lymphocytes Relative: 20 %
Lymphs Abs: 1.3 10*3/uL (ref 0.7–4.0)
MCH: 30 pg (ref 26.0–34.0)
MCHC: 31.5 g/dL (ref 30.0–36.0)
MCV: 95.3 fL (ref 78.0–100.0)
MONO ABS: 0.7 10*3/uL (ref 0.1–1.0)
Monocytes Relative: 11 %
NEUTROS ABS: 4.3 10*3/uL (ref 1.7–7.7)
Neutrophils Relative %: 68 %
PLATELETS: 277 10*3/uL (ref 150–400)
RBC: 3.63 MIL/uL — ABNORMAL LOW (ref 3.87–5.11)
RDW: 14.3 % (ref 11.5–15.5)
WBC: 6.3 10*3/uL (ref 4.0–10.5)

## 2018-03-10 LAB — I-STAT CG4 LACTIC ACID, ED: LACTIC ACID, VENOUS: 1.38 mmol/L (ref 0.5–1.9)

## 2018-03-10 LAB — I-STAT TROPONIN, ED: Troponin i, poc: 0 ng/mL (ref 0.00–0.08)

## 2018-03-10 MED ORDER — SODIUM CHLORIDE 0.9 % IV BOLUS
1000.0000 mL | Freq: Once | INTRAVENOUS | Status: AC
  Administered 2018-03-11: 1000 mL via INTRAVENOUS

## 2018-03-10 NOTE — ED Provider Notes (Signed)
[facility] EMERGENCY DEPARTMENT Provider Note   CSN: [id number] Arrival date & time: 03/10/18  1448     History   Chief Complaint Chief Complaint  Patient presents with  . Hypotension    HPI Veronica Phillips is a 84 y.o. female.  HPI  This is a 84 year old female with a history of breast cancer, reflux, orthostatic hypotension, chronic anemia who presents with dizziness and low blood pressure from the office.  Patient was having a physical therapy evaluation today for some recent falls.  She was noted to be hypotensive specifically with position changes.  At that time she felt dizzy.  Daughter reports that she has had decreased p.o. intake because of "worsening reflux."  No recent fevers or illnesses.  Daughter does report recent frequent UTIs.  Patient was last treated with Cipro in August.  Patient is currently without complaint.  She denies any dizziness at this time.  She denies any chest pain, shortness of breath, abdominal pain, nausea, vomiting.  Regarding her recent falls, she does not describe them as mechanical.  She states "I do not know what happens I just go down."  She does have some dizziness.  No recent changes in medication.  Daughter reports a history of issues with "her blood pressure bottoming out."  She is not on any blood pressure medications.  Denies fevers.  Past Medical History:  Diagnosis Date  . Arthritis    "knees, shoulders" (12/29/2015)  . Atrophic gastritis   . Cancer of left breast ([place]) 1989  . Diverticulosis of colon   . GERD (gastroesophageal reflux disease)   . Glaucoma   . Headache    "awful headaches when I was coming up in my teens"  . Hypertension   . Lactose intolerance   . Pernicious anemia   . Vitamin B 12 deficiency     Patient Active Problem List   Diagnosis Date Noted  . Bilateral primary osteoarthritis of knee 02/27/2018  . Weight loss 08/30/2017  . S/p left hip fracture 07/12/2017  . Subcoracoid bursitis,  right 07/12/2017  . CKD (chronic kidney disease), stage III ([place]) 12/29/2015  . Dizziness 12/29/2015  . UTI (lower urinary tract infection) 12/29/2015  . Orthostatic syncope 12/29/2015  . Fall   . Chronic anemia   . Need for prophylactic vaccination against Streptococcus pneumoniae (pneumococcus) 01/06/2015  . Precordial chest pain 11/05/2014  . Dysphagia, pharyngoesophageal phase   . 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 Achalasia 07/07/2013  . Peripheral edema 05/19/2012  . Glaucoma 07/17/2010  . GERD 07/17/2010  . OTHER DYSPHAGIA 07/17/2010  . PROBLEMS WITH SWALLOWING AND MASTICATION 07/11/2010  . IDIOPATHIC URTICARIA 04/11/2010  . ANEMIA, B12 DEFICIENCY 05/18/2008  . OSTEOARTHRITIS 05/12/2007  . DIVERTICULOSIS, COLON 02/26/2007    Past Surgical History:  Procedure Laterality Date  . BALLOON DILATION N/A 06/28/2014   Procedure: BALLOON DILATION;  Surgeon: Ladene Artist, MD;  Location: [facility];  Service: Endoscopy;  Laterality: N/A;  . BOTOX INJECTION N/A 07/09/2013   Procedure: BOTOX INJECTION;  Surgeon: Ladene Artist, MD;  Location: [facility];  Service: Endoscopy;  Laterality: N/A;  . BOTOX INJECTION N/A 06/28/2014   Procedure: BOTOX INJECTION;  Surgeon: Ladene Artist, MD;  Location: [facility];  Service: Endoscopy;  Laterality: N/A;  . CATARACT EXTRACTION W/ INTRAOCULAR LENS  IMPLANT, BILATERAL Bilateral 08-2015-10/2015  . ESOPHAGEAL MANOMETRY N/A 10/27/2012   Procedure: ESOPHAGEAL MANOMETRY (EM);  Surgeon: Ladene Artist, MD;  [facility]: [facility];  Service:  Endoscopy;  Laterality: N/A;  . ESOPHAGOGASTRODUODENOSCOPY N/A 07/09/2013   Procedure: ESOPHAGOGASTRODUODENOSCOPY (EGD);  Surgeon: Ladene Artist, MD;  Location: [facility];  Service: Endoscopy;  Laterality: N/A;  . ESOPHAGOGASTRODUODENOSCOPY N/A 10/07/2013   Procedure: ESOPHAGOGASTRODUODENOSCOPY (EGD);  Surgeon: Ladene Artist, MD;  Location: [facility];  Service: Endoscopy;  Laterality: N/A;  wtih  botox  . ESOPHAGOGASTRODUODENOSCOPY (EGD) WITH PROPOFOL N/A 06/28/2014   Procedure: ESOPHAGOGASTRODUODENOSCOPY (EGD) WITH PROPOFOL;  Surgeon: Ladene Artist, MD;  [facility]: [facility];  Service: Endoscopy;  Laterality: N/A;  . MASTECTOMY Left 1989     OB History   None      Home Medications    Prior to Admission medications   Medication Sig Start Date End Date Taking? Authorizing Provider  acetaminophen (TYLENOL) 500 MG tablet Take 500 mg by mouth every 6 (six) hours as needed for mild pain or moderate pain.    [provider]  alum & mag hydroxide-simeth (MYLANTA) 200-200-20 MG/5ML suspension Take 15 mLs by mouth every 6 (six) hours as needed for indigestion or heartburn.    [provider]  Ascorbic Acid (VITAMIN C PO) Take 1 tablet by mouth every morning.    [provider]  ciprofloxacin (CIPRO) 500 MG tablet Take 1 tablet (500 mg total) by mouth 2 (two) times daily. 01/15/18   Laurey Morale, MD  CRANBERRY PO Take 1 tablet by mouth every morning.    [provider]  Cyanocobalamin (VITAMIN B-12 PO) Take 1 tablet by mouth daily.    [provider]  ferrous gluconate (FERGON) 325 MG tablet Take 325 mg by mouth daily with breakfast.      [provider]  fluticasone (FLONASE) 50 MCG/ACT nasal spray Place 2 sprays into both nostrils daily. 11/23/16   Nafziger, [name], NP  latanoprost (XALATAN) 0.005 % ophthalmic solution Place 1 drop into both eyes at bedtime.  12/03/14   [provider]  LUTEIN PO Take 1 capsule by mouth every morning.    [provider]  mirtazapine (REMERON SOLTAB) 15 MG disintegrating tablet Take 1 tablet (15 mg total) by mouth at bedtime as needed. 03/19/16   Nafziger, [name], NP  Multiple Vitamins-Minerals (ONE-A-DAY WOMENS 50 PLUS PO) Take 1 tablet by mouth every morning.    [provider]  traMADol (ULTRAM) 50 MG tablet Take 1 tablet (50 mg total) by mouth every 8 (eight) hours as needed.  07/17/17   Gerda Diss, DO  vitamin E 400 UNIT capsule Take 400 Units by mouth daily.    [provider]    Family History Family History  Problem Relation Age of Onset  . Hypertension Mother        family hx  . Lung cancer Father        family hx  . Stroke Unknown        family hx  . Heart disease Sister        family hx    Social History Social History   Tobacco Use  . Smoking status: Never Smoker  . Smokeless tobacco: Never Used  Substance Use Topics  . Alcohol use: No  . Drug use: No     Allergies   Septra [bactrim] and Sulfa antibiotics   Review of Systems Review of Systems  Constitutional: Negative for fever.  Respiratory: Negative for shortness of breath.   Cardiovascular: Negative for chest pain.  Gastrointestinal: Negative for abdominal pain, nausea and vomiting.  Genitourinary: Negative for dysuria.  Musculoskeletal: Negative  for back pain.  Neurological: Positive for dizziness. Negative for headaches.  All other systems reviewed and are negative.    Physical Exam Updated Vital Signs BP 96/63   Pulse 70   Temp 98 F (36.7 C) (Oral)   Resp 16   SpO2 100%   Physical Exam  Constitutional: She is oriented to person, place, and time.  Elderly, nontoxic-appearing  HENT:  Head: Normocephalic and atraumatic.  Eyes: Pupils are equal, round, and reactive to light. EOM are normal.  Neck: Normal range of motion. Neck supple.  Cardiovascular: Normal rate, regular rhythm and normal heart sounds.  No murmur heard. Pulmonary/Chest: Effort normal and breath sounds normal. No respiratory distress. She has no wheezes.  Scarring left chest  Abdominal: Soft. Bowel sounds are normal. There is no tenderness. There is no rebound and no guarding.  Musculoskeletal: She exhibits no edema.  Neurological: She is alert and oriented to person, place, and time.  Fluent speech, cranial nerves II through XII intact, 5 out of 5 strength in all 4 extremities,  no drift  Skin: Skin is warm and dry.  Psychiatric: She has a normal mood and affect.  Nursing note and vitals reviewed.    ED Treatments / Results  Labs (all labs ordered are listed, but only abnormal results are displayed) Labs Reviewed  COMPREHENSIVE METABOLIC PANEL - Abnormal; Notable for the following components:      Result Value   Potassium 3.4 (*)    Creatinine, Ser 1.66 (*)    Albumin 3.4 (*)    GFR calc non Af Amer 26 (*)    GFR calc Af Amer 30 (*)    All other components within normal limits  CBC WITH DIFFERENTIAL/PLATELET - Abnormal; Notable for the following components:   RBC 3.63 (*)    Hemoglobin 10.9 (*)    HCT 34.6 (*)    All other components within normal limits  URINALYSIS, ROUTINE W REFLEX MICROSCOPIC - Abnormal; Notable for the following components:   APPearance CLOUDY (*)    Hgb urine dipstick MODERATE (*)    Protein, ur 30 (*)    Leukocytes, UA LARGE (*)    WBC, UA >50 (*)    Bacteria, UA MANY (*)    All other components within normal limits  URINE CULTURE  I-STAT CG4 LACTIC ACID, ED  I-STAT TROPONIN, ED  I-STAT CG4 LACTIC ACID, ED    EKG EKG Interpretation  Date/Time:  Monday March 10 2018 15:25:37 EDT Ventricular Rate:  74 PR Interval:  148 QRS Duration: 70 QT Interval:  398 QTC Calculation: 441 R Axis:   71 Text Interpretation:  Normal sinus rhythm Septal infarct , age undetermined Abnormal ECG Confirmed by Thayer Jew [phone]) on 03/10/2018 11:11:34 PM   Radiology Ct Head Wo Contrast  Result Date: 03/10/2018 CLINICAL DATA:  84 year old female with hypotension. Initial encounter. EXAM: CT HEAD WITHOUT CONTRAST TECHNIQUE: Contiguous axial images were obtained from the base of the skull through the vertex without intravenous contrast. COMPARISON:  01/25/2017 CT. FINDINGS: Brain: No intracranial hemorrhage or CT evidence of large acute infarct. No intracranial mass lesion noted on this unenhanced exam. Mild global atrophy. Vascular:  Vascular calcifications.  No hyperdense vessel. Skull: No acute abnormality. Sinuses/Orbits: No acute orbital abnormality. Visualized paranasal sinuses clear. Other: Mastoid air cells and middle ear cavities are clear. IMPRESSION: No acute intracranial abnormality noted. Electronically Signed   By: Genia Del M.D.   On: 03/10/2018 17:18    Procedures Procedures (including critical care  time)  Medications Ordered in ED Medications  sodium chloride 0.9 % bolus 1,000 mL (1,000 mLs Intravenous New Bag/Given 03/11/18 0015)  cefTRIAXone (ROCEPHIN) 1 g in sodium chloride 0.9 % 100 mL IVPB (has no administration in time range)     Initial Impression / Assessment and Plan / ED Course  I have reviewed the triage vital signs and the nursing notes.  Pertinent labs & imaging results that were available during my care of the patient were reviewed by me and considered in my medical decision making (see chart for details).     Patient presents with orthostasis from her primary physician office.  Was being assessed for physical therapy given recent falls.  She is overall nontoxic-appearing on exam.  She has had some marginal blood pressures lowest with systolic in the 36R.  She is currently without complaint.  Does have a history of chronic UTIs as well.  No fevers.  I have reviewed her lab work.  She does have some AKI with a creatinine of 1.66.  Baseline 1.2-1.3.  No leukocytosis.  Lactate is normal.  She does not appear septic on exam.  Awaiting urinalysis.  Patient was given fluids.  Nursing reports that upon attempting orthostatics, patient became very unsteady and dizzy upon standing.  Orthostatics were aborted.  Urinalysis reviewed.  Patient appears to have an acute UTI with greater than 50 white cells and many bacteria.  Urine culture sent.  Last culture data with positive Klebsiella pneumonia susceptible to Rocephin.  Patient was given IV Rocephin.  Given AKI, UTI, orthostasis, will admit for fluids,  antibiotics, and physical therapy evaluation.  This was discussed with patient and her daughter who agree with plan.  Final Clinical Impressions(s) / ED Diagnoses   Final diagnoses:  Hypotension, unspecified hypotension type  Fall, initial encounter  Acute cystitis without hematuria  AKI (acute kidney injury) [facility])    ED Discharge Orders    None       Dina Rich, Barbette Hair, MD 03/11/18 [phone]

## 2018-03-10 NOTE — ED Triage Notes (Signed)
Pt to ER sent from PT for "hypotension." From reviewing the chart, it appears patient had +orthostatic vital signs. Pt in NAD at this time. Daughter at bedside reports patient has had poor po intake recently. Pt denies any concern at this time.

## 2018-03-10 NOTE — Telephone Encounter (Signed)
Noted  

## 2018-03-10 NOTE — Progress Notes (Signed)
Veronica Phillips is a 84 y.o. female here for a new problem.  History of Present Illness:   Chief Complaint  Patient presents with  . Hypotension  . Dizziness  . Fatigue  . Headache    HPI  Patient is 84 y/o female who presented to our office for initiation of PT. She was scheduled for physical therapy for "gait instability." Her blood pressure during assessment was 90/50 and then when standing was 60/40. Our office PT did not proceed with treatment. Patient complains of dizziness when standing. Patient's daughter present with patient. Patient's daughter was instructed by [facility] to take patient to ED  however she refused and stated that she "just wanted an appointment" for further evaluation and presents on my schedule today.  Patient reports that she had an unwitnessed fall over the weekend and confirms that she hit the back of her head, has had HA since that fall. Her daughter states that this was an unwitnessed fall and cannot provide [place] with further details. Patient does not have an alert button.  Daughter reports that patient has not been eating much due to "terrible heartburn" which is not completely unusual for her. Weight appears overall stable per records x 1 year.  BP Readings from Last 3 Encounters:  03/10/18 100/60  02/27/18 119/61  01/23/18 (!) 138/52    Wt Readings from Last 10 Encounters:  02/27/18 105 lb (47.6 kg)  01/23/18 105 lb 3.2 oz (47.7 kg)  10/16/17 101 lb (45.8 kg)  08/30/17 100 lb 12.8 oz (45.7 kg)  07/12/17 107 lb 12.8 oz (48.9 kg)  02/08/17 108 lb (49 kg)  01/29/17 108 lb (49 kg)  01/24/17 108 lb (49 kg)  11/23/16 108 lb 6.4 oz (49.2 kg)  11/07/16 111 lb 12.8 oz (50.7 kg)     Past Medical History:  Diagnosis Date  . Arthritis    "knees, shoulders" (12/29/2015)  . Atrophic gastritis   . Cancer of left breast ([place]) 1989  . Diverticulosis of colon   . GERD (gastroesophageal reflux disease)   . Glaucoma   . Headache    "awful headaches when I was  coming up in my teens"  . Hypertension   . Lactose intolerance   . Pernicious anemia   . Vitamin B 12 deficiency      Social History   Socioeconomic History  . Marital status: Widowed    Spouse name: Not on file  . Number of children: Not on file  . Years of education: Not on file  . Highest education level: Not on file  Occupational History  . Not on file  Social Needs  . Financial resource strain: Not on file  . Food insecurity:    Worry: Not on file    Inability: Not on file  . Transportation needs:    Medical: Not on file    Non-medical: Not on file  Tobacco Use  . Smoking status: Never Smoker  . Smokeless tobacco: Never Used  Substance and Sexual Activity  . Alcohol use: No  . Drug use: No  . Sexual activity: Never  Lifestyle  . Physical activity:    Days per week: Not on file    Minutes per session: Not on file  . Stress: Not on file  Relationships  . Social connections:    Talks on phone: Not on file    Gets together: Not on file    Attends religious service: Not on file    Active member of club  or organization: Not on file    Attends meetings of clubs or organizations: Not on file    Relationship status: Not on file  . Intimate partner violence:    Fear of current or ex partner: Not on file    Emotionally abused: Not on file    Physically abused: Not on file    Forced sexual activity: Not on file  Other Topics Concern  . 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 BALLOON DILATION N/A 06/28/2014   Procedure: BALLOON DILATION;  Surgeon: Ladene Artist, MD;  Location: [facility];  Service: Endoscopy;  Laterality: N/A;  . BOTOX INJECTION N/A 07/09/2013   Procedure: BOTOX INJECTION;  Surgeon: Ladene Artist, MD;  Location: [facility];  Service: Endoscopy;  Laterality: N/A;  . BOTOX INJECTION N/A 06/28/2014   Procedure: BOTOX INJECTION;  Surgeon: Ladene Artist,  MD;  [facility]: [facility];  Service: Endoscopy;  Laterality: N/A;  . CATARACT EXTRACTION W/ INTRAOCULAR LENS  IMPLANT, BILATERAL Bilateral 08-2015-10/2015  . ESOPHAGEAL MANOMETRY N/A 10/27/2012   Procedure: ESOPHAGEAL MANOMETRY (EM);  Surgeon: Ladene Artist, MD;  Location: [facility];  Service: Endoscopy;  Laterality: N/A;  . ESOPHAGOGASTRODUODENOSCOPY N/A 07/09/2013   Procedure: ESOPHAGOGASTRODUODENOSCOPY (EGD);  Surgeon: Ladene Artist, MD;  Location: [facility];  Service: Endoscopy;  Laterality: N/A;  . ESOPHAGOGASTRODUODENOSCOPY N/A 10/07/2013   Procedure: ESOPHAGOGASTRODUODENOSCOPY (EGD);  Surgeon: Ladene Artist, MD;  Location: [facility];  Service: Endoscopy;  Laterality: N/A;  wtih botox  . ESOPHAGOGASTRODUODENOSCOPY (EGD) WITH PROPOFOL N/A 06/28/2014   Procedure: ESOPHAGOGASTRODUODENOSCOPY (EGD) WITH PROPOFOL;  Surgeon: Ladene Artist, MD;  [facility];  Service: Endoscopy;  Laterality: N/A;  . MASTECTOMY Left 1989    Family History  Problem Relation Age of Onset  . Hypertension Mother        family hx  . Lung cancer Father        family hx  . Stroke Unknown        family hx  . Heart disease Sister        family hx    Allergies  Allergen Reactions  . Septra [Bactrim] Swelling  . Sulfa Antibiotics Swelling    Current Medications:   Current Outpatient Medications:  .  acetaminophen (TYLENOL) 500 MG tablet, Take 500 mg by mouth every 6 (six) hours as needed for mild pain or moderate pain., Disp: , Rfl:  .  alum & mag hydroxide-simeth (MYLANTA) 200-200-20 MG/5ML suspension, Take 15 mLs by mouth every 6 (six) hours as needed for indigestion or heartburn., Disp: , Rfl:  .  Ascorbic Acid (VITAMIN C PO), Take 1 tablet by mouth every morning., Disp: , Rfl:  .  ciprofloxacin (CIPRO) 500 MG tablet, Take 1 tablet (500 mg total) by mouth 2 (two) times daily., Disp: 6 tablet, Rfl: 0 .  CRANBERRY PO, Take 1 tablet by mouth every morning., Disp: , Rfl:  .   Cyanocobalamin (VITAMIN B-12 PO), Take 1 tablet by mouth daily., Disp: , Rfl:  .  ferrous gluconate (FERGON) 325 MG tablet, Take 325 mg by mouth daily with breakfast.  , Disp: , Rfl:  .  fluticasone (FLONASE) 50 MCG/ACT nasal spray, Place 2 sprays into both nostrils daily., Disp: 16 g, Rfl: 6 .  latanoprost (XALATAN) 0.005 % ophthalmic solution, Place 1 drop into both eyes at bedtime. , Disp: ,  Rfl: 0 .  LUTEIN PO, Take 1 capsule by mouth every morning., Disp: , Rfl:  .  mirtazapine (REMERON SOLTAB) 15 MG disintegrating tablet, Take 1 tablet (15 mg total) by mouth at bedtime as needed., Disp: 90 tablet, Rfl: 1 .  Multiple Vitamins-Minerals (ONE-A-DAY WOMENS 50 PLUS PO), Take 1 tablet by mouth every morning., Disp: , Rfl:  .  traMADol (ULTRAM) 50 MG tablet, Take 1 tablet (50 mg total) by mouth every 8 (eight) hours as needed., Disp: 15 tablet, Rfl: 0 .  vitamin E 400 UNIT capsule, Take 400 Units by mouth daily., Disp: , Rfl:    Review of Systems:   ROS  Negative unless otherwise specified per HPI.  Vitals:   Vitals:   03/10/18 1418  BP: 100/60  Pulse: 76  SpO2: 94%     There is no height or weight on file to calculate BMI.  Physical Exam:   Physical Exam  Constitutional: She appears well-developed and well-nourished.  cachectic  HENT:  Head: Normocephalic and atraumatic.  Eyes: Conjunctivae and EOM are normal.  Neck: Normal range of motion. Neck supple.  Pulmonary/Chest: Effort normal.  Musculoskeletal: Normal range of motion.  Skin: Skin is warm and dry.  Psychiatric: She has a normal mood and affect. Her behavior is normal. Judgment and thought content normal.    Assessment and Plan:    Nhyla was seen today for hypotension, dizziness, fatigue and headache.  Diagnoses and all orders for this visit:  Hypotension, unspecified hypotension type  Dizziness  Fatigue, unspecified type  Generalized weakness   I discussed with patient's daughter that patient would best  be served in ER for further evaluation and treatment. Patient's daughter refused EMS and stated that she would transport patient to ER herself. Patient and daughter verbalized understanding of plan.  Patient has a HIGH level of medical complexity due to number of diagnoses/treatment options and amount/complexity of data reviewed.   Inda Coke, PA-C

## 2018-03-10 NOTE — ED Provider Notes (Signed)
MSE was initiated and I personally evaluated the patient and placed orders (if any) at  3:18 PM on March 10, 2018.  The patient appears stable so that the remainder of the MSE may be completed by another provider.  Patient placed in Quick Look pathway, seen and evaluated   Chief Complaint: Hypertension in doctor's office  HPI:   Patient is a 84 yo female with a history of achalasia, osteoarthritis, presenting for orthostatic hypotension noted in her PMDs office.  Patient initially was at physical therapy this morning, when they ceased treatment, as patient's blood pressure was 60/40.  Patient is physically sent to the emergency department.  Also noted that patient had a fall 2 days ago, and she believes she hit her head.  She is unable to provide any more details.  Patient denies any chest pain, shortness of breath, dizziness or lightheadedness at present, syncope or presyncope.  ROS: See HPI (one)  Physical Exam:   Gen: No distress  Neuro: Awake and Alert  Skin: Warm. Tenting of skin and poor turgor.    Focused Exam: Heart regular rate and rhythm.  Radial pulse 2+ and equal bilaterally.  Regular non-tachycardic.   Initiation of care has begun. The patient has been counseled on the process, plan, and necessity for staying for the completion/evaluation, and the remainder of the medical screening examination    Tamala Julian 03/10/18 [place]    Davonna Belling, MD 03/10/18 [phone]

## 2018-03-10 NOTE — Telephone Encounter (Signed)
Call received from physical therapist stating patient was with her and BP was low. Pt daughter is present. BP 90/50 sitting and 60/40 standing. Pt is dizzy while standing. But no other symptoms or complaints. Pt daughter refused transport to ED and wanted just an appointment. Pt was being seen at [facility]. Appointment made with S. Morene Rankins, [place]. at [facility]. Care advice given   Reason for Disposition . [2] Fall in systolic BP > 20 mm Hg from normal AND [2] dizzy, lightheaded, or weak  Answer Assessment - Initial Assessment Questions 1. BLOOD PRESSURE: "What is the blood pressure?" "Did you take at least two measurements 5 minutes apart?"     90/50 sitting  Standing 60/40 2. ONSET: "When did you take your blood pressure?"     5 minutes ago 3. HOW: "How did you obtain the blood pressure?" (e.g., visiting nurse, automatic home BP monitor)     Physical therapy appointment 4. HISTORY: "Do you have a history of low blood pressure?" "What is your blood pressure normally?"     130/80 5. MEDICATIONS: "Are you taking any medications for blood pressure?" If yes: "Have they been changed recently?"     No and no BP medication 6. PULSE RATE: "Do you know what your pulse rate is?"      76 7. OTHER SYMPTOMS: "Have you been sick recently?" "Have you had a recent injury?"     No cotizone injections  8. PREGNANCY: "Is there any chance you are pregnant?" "When was your last menstrual period?"     N/A  Protocols used: LOW BLOOD PRESSURE-A-AH

## 2018-03-11 ENCOUNTER — Other Ambulatory Visit: Payer: Self-pay

## 2018-03-11 ENCOUNTER — Encounter (HOSPITAL_COMMUNITY): Payer: Self-pay | Admitting: Internal Medicine

## 2018-03-11 ENCOUNTER — Encounter (HOSPITAL_COMMUNITY): Payer: Self-pay | Admitting: General Practice

## 2018-03-11 ENCOUNTER — Encounter: Payer: Self-pay | Admitting: Physical Therapy

## 2018-03-11 DIAGNOSIS — N3000 Acute cystitis without hematuria: Secondary | ICD-10-CM | POA: Diagnosis not present

## 2018-03-11 DIAGNOSIS — G9340 Encephalopathy, unspecified: Secondary | ICD-10-CM | POA: Diagnosis not present

## 2018-03-11 DIAGNOSIS — I951 Orthostatic hypotension: Secondary | ICD-10-CM | POA: Diagnosis not present

## 2018-03-11 LAB — URINALYSIS, ROUTINE W REFLEX MICROSCOPIC
Bilirubin Urine: NEGATIVE
Glucose, UA: NEGATIVE mg/dL
Ketones, ur: NEGATIVE mg/dL
Nitrite: NEGATIVE
PROTEIN: 30 mg/dL — AB
SPECIFIC GRAVITY, URINE: 1.011 (ref 1.005–1.030)
WBC, UA: >50 WBC/hpf — ABNORMAL HIGH (ref 0–5)
pH: 5 (ref 5.0–8.0)

## 2018-03-11 LAB — CBC
HCT: 29.5 % — ABNORMAL LOW (ref 36.0–46.0)
Hemoglobin: 8.9 g/dL — ABNORMAL LOW (ref 12.0–15.0)
MCH: 29.9 pg (ref 26.0–34.0)
MCHC: 30.2 g/dL (ref 30.0–36.0)
MCV: 99 fL (ref 78.0–100.0)
PLATELETS: 214 10*3/uL (ref 150–400)
RBC: 2.98 MIL/uL — AB (ref 3.87–5.11)
RDW: 14.4 % (ref 11.5–15.5)
WBC: 5.2 10*3/uL (ref 4.0–10.5)

## 2018-03-11 LAB — BASIC METABOLIC PANEL
Anion gap: 7 (ref 5–15)
BUN: 18 mg/dL (ref 8–23)
CALCIUM: 6.8 mg/dL — AB (ref 8.9–10.3)
CO2: 20 mmol/L — AB (ref 22–32)
CREATININE: 1.26 mg/dL — AB (ref 0.44–1.00)
Chloride: 118 mmol/L — ABNORMAL HIGH (ref 98–111)
GFR calc non Af Amer: 36 mL/min — ABNORMAL LOW (ref >60)
GFR, EST AFRICAN AMERICAN: 42 mL/min — AB (ref >60)
Glucose, Bld: 85 mg/dL (ref 70–99)
Potassium: 3 mmol/L — ABNORMAL LOW (ref 3.5–5.1)
Sodium: 145 mmol/L (ref 135–145)

## 2018-03-11 LAB — CORTISOL: Cortisol, Plasma: 8.1 ug/dL

## 2018-03-11 LAB — GLUCOSE, CAPILLARY: Glucose-Capillary: 80 mg/dL (ref 70–99)

## 2018-03-11 LAB — MAGNESIUM: Magnesium: 1.5 mg/dL — ABNORMAL LOW (ref 1.7–2.4)

## 2018-03-11 MED ORDER — FERROUS GLUCONATE 324 (38 FE) MG PO TABS
325.0000 mg | ORAL_TABLET | Freq: Every day | ORAL | Status: DC
  Administered 2018-03-11 – 2018-03-12 (×2): 325 mg via ORAL
  Filled 2018-03-11 (×3): qty 1

## 2018-03-11 MED ORDER — ONDANSETRON HCL 4 MG PO TABS: 4.0000 mg | ORAL_TABLET | Freq: Four times a day (QID) | ORAL | Status: DC | PRN

## 2018-03-11 MED ORDER — INFLUENZA VAC SPLIT HIGH-DOSE 0.5 ML IM SUSY
0.5000 mL | PREFILLED_SYRINGE | INTRAMUSCULAR | Status: AC
  Administered 2018-03-13: 0.5 mL via INTRAMUSCULAR
  Filled 2018-03-11: qty 0.5

## 2018-03-11 MED ORDER — VITAMIN B-12 1000 MCG PO TABS
1000.0000 ug | ORAL_TABLET | Freq: Every day | ORAL | Status: DC
  Administered 2018-03-11 – 2018-03-13 (×3): 1000 ug via ORAL
  Filled 2018-03-11 (×4): qty 1

## 2018-03-11 MED ORDER — ACETAMINOPHEN 325 MG PO TABS: 650.0000 mg | ORAL_TABLET | Freq: Four times a day (QID) | ORAL | Status: DC | PRN

## 2018-03-11 MED ORDER — FLUTICASONE PROPIONATE 50 MCG/ACT NA SUSP
2.0000 | Freq: Every day | NASAL | Status: DC
  Administered 2018-03-11 – 2018-03-13 (×3): 2 via NASAL
  Filled 2018-03-11: qty 16

## 2018-03-11 MED ORDER — SODIUM CHLORIDE 0.9 % IV SOLN
INTRAVENOUS | Status: AC
  Administered 2018-03-11 (×2): via INTRAVENOUS

## 2018-03-11 MED ORDER — VITAMIN E 180 MG (400 UNIT) PO CAPS
400.0000 [IU] | ORAL_CAPSULE | Freq: Every day | ORAL | Status: DC
  Administered 2018-03-11 – 2018-03-12 (×2): 400 [IU] via ORAL
  Filled 2018-03-11 (×3): qty 1

## 2018-03-11 MED ORDER — SODIUM CHLORIDE 0.9 % IV SOLN
1.0000 g | Freq: Once | INTRAVENOUS | Status: AC
  Administered 2018-03-11: 1 g via INTRAVENOUS
  Filled 2018-03-11: qty 10

## 2018-03-11 MED ORDER — SODIUM CHLORIDE 0.9 % IV SOLN
1.0000 g | INTRAVENOUS | Status: DC
  Administered 2018-03-11 – 2018-03-12 (×2): 1 g via INTRAVENOUS
  Filled 2018-03-11 (×3): qty 10

## 2018-03-11 MED ORDER — ONDANSETRON HCL 4 MG/2ML IJ SOLN: 4.0000 mg | Freq: Four times a day (QID) | INTRAMUSCULAR | Status: DC | PRN

## 2018-03-11 MED ORDER — ENOXAPARIN SODIUM 30 MG/0.3ML ~~LOC~~ SOLN
30.0000 mg | Freq: Every day | SUBCUTANEOUS | Status: DC
  Administered 2018-03-11 – 2018-03-13 (×3): 30 mg via SUBCUTANEOUS
  Filled 2018-03-11 (×4): qty 0.3

## 2018-03-11 MED ORDER — LATANOPROST 0.005 % OP SOLN
1.0000 [drp] | Freq: Every day | OPHTHALMIC | Status: DC
  Administered 2018-03-11 – 2018-03-12 (×2): 1 [drp] via OPHTHALMIC
  Filled 2018-03-11: qty 2.5

## 2018-03-11 MED ORDER — POTASSIUM CHLORIDE CRYS ER 20 MEQ PO TBCR
40.0000 meq | EXTENDED_RELEASE_TABLET | Freq: Once | ORAL | Status: AC
  Administered 2018-03-11: 40 meq via ORAL
  Filled 2018-03-11: qty 2

## 2018-03-11 MED ORDER — ACETAMINOPHEN 650 MG RE SUPP: 650.0000 mg | Freq: Four times a day (QID) | RECTAL | Status: DC | PRN

## 2018-03-11 MED ORDER — MAGNESIUM SULFATE 2 GM/50ML IV SOLN
2.0000 g | Freq: Once | INTRAVENOUS | Status: AC
  Administered 2018-03-11: 2 g via INTRAVENOUS
  Filled 2018-03-11: qty 50

## 2018-03-11 NOTE — ED Notes (Signed)
Heart healthy breakfast tray ordered 

## 2018-03-11 NOTE — Progress Notes (Signed)
Patient admitted after midnight, please see H&P.  She is a remarkably healthy 84 yo here with orthostatic hypotension and decreased PO intake possibly due to a UTI.    Orthostatic hypotension -cortisol normal -recheck orthos after IVF -PT consult -TED hose -per chart review, has been happening for a few months  UTI  -ceftriaxone - follow urine cultures.  Chronic anemia  -on B12 and iron supplements which will be continued.  Chronic kidney disease stage III  -creatinine appears to be at baseline  Hypokalemia -replete -check Mg  Patient lives with daughter but is fairly independent.  Has a cane, walker, and wheelchair at home and uses based on how she feels.  Eulogio Bear DO

## 2018-03-11 NOTE — Progress Notes (Signed)
Patient arrived to unit, cardiac monitoring intiated and verified. VSS.

## 2018-03-11 NOTE — H&P (Signed)
History and Physical    Veronica Phillips UYQ:[id number] DOB: 06-07-1923 DOA: 03/10/2018  PCP: Dorothyann Peng, NP  Patient coming from: Home.  History obtained from patient's daughter.  Chief Complaint: Low blood pressure.  HPI: Veronica Phillips is a 84 y.o. female with history of orthostatic hypotension with recurrent falls who has been going to the physical therapy was found to be having low blood pressure with systolic in the 63O at the physical therapy.  Patient was referred to the ER.  As per the daughter patient has frequent falls when she tries to walk last fall was around 4 days ago weakness and did not have any loss of consciousness or did not hit her head.  Patient has such has not had any nausea vomiting abdominal pain diarrhea headache visual symptoms.  ED Course: In the ER it was difficult to get orthostatic since patient was getting dizzy on trying to stand.  Patient was started on fluid bolus and labs also reveal UTI for which patient was started on ceftriaxone.  Patient became mildly confused in the ER.  CT head was unremarkable and appears nonfocal.  Review of Systems: As per HPI, rest all negative.   Past Medical History:  Diagnosis Date  . Arthritis    "knees, shoulders" (12/29/2015)  . Atrophic gastritis   . Cancer of left breast ([place]) 1989  . Diverticulosis of colon   . GERD (gastroesophageal reflux disease)   . Glaucoma   . Headache    "awful headaches when I was coming up in my teens"  . Hypertension   . Lactose intolerance   . Pernicious anemia   . Vitamin B 12 deficiency     Past Surgical History:  Procedure Laterality Date  . BALLOON DILATION N/A 06/28/2014   Procedure: BALLOON DILATION;  Surgeon: Ladene Artist, MD;  Location: [facility];  Service: Endoscopy;  Laterality: N/A;  . BOTOX INJECTION N/A 07/09/2013   Procedure: BOTOX INJECTION;  Surgeon: Ladene Artist, MD;  Location: [facility];  Service: Endoscopy;  Laterality: N/A;  . BOTOX INJECTION  N/A 06/28/2014   Procedure: BOTOX INJECTION;  Surgeon: Ladene Artist, MD;  Location: [facility];  Service: Endoscopy;  Laterality: N/A;  . CATARACT EXTRACTION W/ INTRAOCULAR LENS  IMPLANT, BILATERAL Bilateral 08-2015-10/2015  . ESOPHAGEAL MANOMETRY N/A 10/27/2012   Procedure: ESOPHAGEAL MANOMETRY (EM);  Surgeon: Ladene Artist, MD;  [facility]: [facility];  Service: Endoscopy;  Laterality: N/A;  . ESOPHAGOGASTRODUODENOSCOPY N/A 07/09/2013   Procedure: ESOPHAGOGASTRODUODENOSCOPY (EGD);  Surgeon: Ladene Artist, MD;  Location: [facility];  Service: Endoscopy;  Laterality: N/A;  . ESOPHAGOGASTRODUODENOSCOPY N/A 10/07/2013   Procedure: ESOPHAGOGASTRODUODENOSCOPY (EGD);  Surgeon: Ladene Artist, MD;  Location: [facility];  Service: Endoscopy;  Laterality: N/A;  wtih botox  . ESOPHAGOGASTRODUODENOSCOPY (EGD) WITH PROPOFOL N/A 06/28/2014   Procedure: ESOPHAGOGASTRODUODENOSCOPY (EGD) WITH PROPOFOL;  Surgeon: Ladene Artist, MD;  [facility]: [facility];  Service: Endoscopy;  Laterality: N/A;  . MASTECTOMY Left 1989     reports that she has never smoked. She has never used smokeless tobacco. She reports that she does not drink alcohol or use drugs.  Allergies  Allergen Reactions  . Septra [Bactrim] Swelling    Site of swelling not recalled by the patient  . Sulfa Antibiotics Swelling    Site of swelling not recalled by the patient  . Tape Other (See Comments)    Skin is sensitive; please use paper tape!!    Family History  Problem Relation  Age of Onset  . Hypertension Mother        family hx  . Lung cancer Father        family hx  . Stroke Unknown        family hx  . Heart disease Sister        family hx    Prior to Admission medications   Medication Sig Start Date End Date Taking? Authorizing Provider  acetaminophen (TYLENOL) 500 MG tablet Take 500 mg by mouth every 6 (six) hours as needed (for pain).    Yes [provider]  alum & mag hydroxide-simeth (MYLANTA) 200-200-20  MG/5ML suspension Take 15 mLs by mouth every 6 (six) hours as needed for indigestion or heartburn.   Yes [provider]  CRANBERRY PO Take 1 tablet by mouth at bedtime.    Yes [provider]  Cyanocobalamin (VITAMIN B-12 PO) Take 1 tablet by mouth at bedtime.    Yes [provider]  ferrous gluconate (FERGON) 325 MG tablet Take 325 mg by mouth daily after supper.    Yes [provider]  fluticasone (FLONASE) 50 MCG/ACT nasal spray Place 2 sprays into both nostrils daily. 11/23/16  Yes Nafziger, [name], NP  latanoprost (XALATAN) 0.005 % ophthalmic solution Place 1 drop into both eyes at bedtime.  12/03/14  Yes [provider]  LUTEIN PO Take 1 capsule by mouth at bedtime.    Yes [provider]  Multiple Vitamins-Minerals (ONE-A-DAY WOMENS 50 PLUS PO) Take 1 tablet by mouth daily after supper.    Yes [provider]  vitamin E 400 UNIT capsule Take 400 Units by mouth at bedtime.    Yes [provider]  Ascorbic Acid (VITAMIN C PO) Take 1 tablet by mouth at bedtime.     [provider]  ciprofloxacin (CIPRO) 500 MG tablet Take 1 tablet (500 mg total) by mouth 2 (two) times daily. Patient not taking: Reported on 03/11/2018 01/15/18   Laurey Morale, MD  mirtazapine (REMERON SOLTAB) 15 MG disintegrating tablet Take 1 tablet (15 mg total) by mouth at bedtime as needed. Patient not taking: Reported on 03/11/2018 03/19/16   Dorothyann Peng, NP  traMADol (ULTRAM) 50 MG tablet Take 1 tablet (50 mg total) by mouth every 8 (eight) hours as needed. Patient not taking: Reported on 03/11/2018 07/17/17   Gerda Diss, DO    Physical Exam: Vitals:   03/11/18 0100 03/11/18 0130 03/11/18 0145 03/11/18 0200  BP:  (!) 157/79 (!) 165/83 (!) 151/79  Pulse: 79   77  Resp: (!) 22 18 (!) 24 13  Temp:      TempSrc:      SpO2: (!) 89%   98%      Constitutional: Moderately built and nourished. Vitals:   03/11/18 0100 03/11/18 0130  03/11/18 0145 03/11/18 0200  BP:  (!) 157/79 (!) 165/83 (!) 151/79  Pulse: 79   77  Resp: (!) 22 18 (!) 24 13  Temp:      TempSrc:      SpO2: (!) 89%   98%   Eyes: Anicteric no pallor. ENMT: No discharge from the ears eyes nose or mouth. Neck: No mass felt.  No neck rigidity. Respiratory: No rhonchi or crepitations. Cardiovascular: S1-S2 heard no murmurs appreciated. Abdomen: Soft nontender bowel sounds present. Musculoskeletal: No edema.  No joint effusion. Skin: No rash. Neurologic: Alert awake oriented to name and place.  Moves all extremities 5 x 5. Psychiatric: Oriented to name  and place.   Labs on Admission: I have personally reviewed following labs and imaging studies  CBC: Recent Labs  Lab 03/10/18 1536  WBC 6.3  NEUTROABS 4.3  HGB 10.9*  HCT 34.6*  MCV 95.3  PLT 277   Basic Metabolic Panel: Recent Labs  Lab 03/10/18 1536  NA 142  K 3.4*  CL 104  CO2 26  GLUCOSE 96  BUN 22  CREATININE 1.66*  CALCIUM 9.0   GFR: Estimated Creatinine Clearance: 16.2 mL/min (A) (by C-G formula based on SCr of 1.66 mg/dL (H)). Liver Function Tests: Recent Labs  Lab 03/10/18 1536  AST 22  ALT 15  ALKPHOS 64  BILITOT 0.9  PROT 6.6  ALBUMIN 3.4*   No results for input(s): LIPASE, AMYLASE in the last 168 hours. No results for input(s): AMMONIA in the last 168 hours. Coagulation Profile: No results for input(s): INR, PROTIME in the last 168 hours. Cardiac Enzymes: No results for input(s): CKTOTAL, CKMB, CKMBINDEX, TROPONINI in the last 168 hours. BNP (last 3 results) No results for input(s): PROBNP in the last 8760 hours. HbA1C: No results for input(s): HGBA1C in the last 72 hours. CBG: No results for input(s): GLUCAP in the last 168 hours. Lipid Profile: No results for input(s): CHOL, HDL, LDLCALC, TRIG, CHOLHDL, LDLDIRECT in the last 72 hours. Thyroid Function Tests: No results for input(s): TSH, T4TOTAL, FREET4, T3FREE, THYROIDAB in the last 72 hours. Anemia  Panel: No results for input(s): VITAMINB12, FOLATE, FERRITIN, TIBC, IRON, RETICCTPCT in the last 72 hours. Urine analysis:    Component Value Date/Time   COLORURINE YELLOW 03/11/2018 0012   APPEARANCEUR CLOUDY (A) 03/11/2018 0012   LABSPEC 1.011 03/11/2018 0012   PHURINE 5.0 03/11/2018 0012   GLUCOSEU NEGATIVE 03/11/2018 0012   GLUCOSEU NEGATIVE 01/15/2018 1231   HGBUR MODERATE (A) 03/11/2018 0012   HGBUR 2+ 07/11/2010 0921   BILIRUBINUR NEGATIVE 03/11/2018 0012   BILIRUBINUR Negative 01/23/2018 1440   KETONESUR NEGATIVE 03/11/2018 0012   PROTEINUR 30 (A) 03/11/2018 0012   UROBILINOGEN 0.2 01/23/2018 1440   UROBILINOGEN 0.2 01/15/2018 1231   NITRITE NEGATIVE 03/11/2018 0012   LEUKOCYTESUR LARGE (A) 03/11/2018 0012   Sepsis Labs: @LABRCNTIP (procalcitonin:4,lacticidven:4) )No results found for this or any previous visit (from the past 240 hour(s)).   Radiological Exams on Admission: Ct Head Wo Contrast  Result Date: 03/10/2018 CLINICAL DATA:  84 year old female with hypotension. Initial encounter. EXAM: CT HEAD WITHOUT CONTRAST TECHNIQUE: Contiguous axial images were obtained from the base of the skull through the vertex without intravenous contrast. COMPARISON:  01/25/2017 CT. FINDINGS: Brain: No intracranial hemorrhage or CT evidence of large acute infarct. No intracranial mass lesion noted on this unenhanced exam. Mild global atrophy. Vascular: Vascular calcifications.  No hyperdense vessel. Skull: No acute abnormality. Sinuses/Orbits: No acute orbital abnormality. Visualized paranasal sinuses clear. Other: Mastoid air cells and middle ear cavities are clear. IMPRESSION: No acute intracranial abnormality noted. Electronically Signed   By: Genia Del M.D.   On: 03/10/2018 17:18    EKG: Independently reviewed.  Normal sinus rhythm.  Assessment/Plan Principal Problem:   Orthostatic hypotension Active Problems:   ANEMIA, B12 DEFICIENCY   Acute cystitis without hematuria   CKD  (chronic kidney disease), stage III ([facility])   Lower urinary tract infectious disease   Acute encephalopathy    1. Orthostatic hypotension -we will check cortisol level gently hydrate for now.  Get physical therapy consult. 2. Acute encephalopathy likely from UTI.  On ceftriaxone. 3. UTI on ceftriaxone follow urine  cultures. 4. Chronic anemia on B12 and iron supplements which will be continued. 5. Chronic kidney disease stage III -creatinine appears to be at baseline.   DVT prophylaxis: Lovenox. Code Status: Full code. Family Communication: Patient's daughter. Disposition Plan: Home. Consults called: Physical therapy. Admission status: Observation.   Rise Patience MD [facility] Pager [phone].  If 7PM-7AM, please contact night-coverage [email or web address] Password [email or web address]  03/11/2018, 3:00 AM

## 2018-03-11 NOTE — Therapy (Addendum)
[place], [place], [address] Phone: [phone]   Fax:  [phone]  Physical Therapy Evaluation  Patient Details  Name: Veronica Phillips MRN: [id number] Date of Birth: October 01, 1922 Referring Provider: Dorothyann Peng   Encounter Date: 03/10/2018  PT End of Session - 03/11/18 0939    Visit Number  1    Number of Visits  12    Authorization Type  [employer] Medicare    PT Start Time  6754    PT Stop Time  1345    PT Time Calculation (min)  40 min    Activity Tolerance  Patient tolerated treatment well;Treatment limited secondary to medical complications (Comment)    Behavior During Therapy  Flat affect       Past Medical History:  Diagnosis Date  . Arthritis    "knees, shoulders" (12/29/2015)  . Atrophic gastritis   . Cancer of left breast ([place]) 1989  . Diverticulosis of colon   . GERD (gastroesophageal reflux disease)   . Glaucoma   . Headache    "awful headaches when I was coming up in my teens"  . Hypertension   . Lactose intolerance   . Pernicious anemia   . Vitamin B 12 deficiency     Past Surgical History:  Procedure Laterality Date  . BALLOON DILATION N/A 06/28/2014   Procedure: BALLOON DILATION;  Surgeon: Ladene Artist, MD;  Location: [facility];  Service: Endoscopy;  Laterality: N/A;  . BOTOX INJECTION N/A 07/09/2013   Procedure: BOTOX INJECTION;  Surgeon: Ladene Artist, MD;  Location: [facility];  Service: Endoscopy;  Laterality: N/A;  . BOTOX INJECTION N/A 06/28/2014   Procedure: BOTOX INJECTION;  Surgeon: Ladene Artist, MD;  Location: [facility];  Service: Endoscopy;  Laterality: N/A;  . CATARACT EXTRACTION W/ INTRAOCULAR LENS  IMPLANT, BILATERAL Bilateral 08-2015-10/2015  . ESOPHAGEAL MANOMETRY N/A 10/27/2012   Procedure: ESOPHAGEAL MANOMETRY (EM);  Surgeon: Ladene Artist, MD;  Location: [facility];  Service: Endoscopy;  Laterality: N/A;  . ESOPHAGOGASTRODUODENOSCOPY N/A 07/09/2013    Procedure: ESOPHAGOGASTRODUODENOSCOPY (EGD);  Surgeon: Ladene Artist, MD;  Location: [facility];  Service: Endoscopy;  Laterality: N/A;  . ESOPHAGOGASTRODUODENOSCOPY N/A 10/07/2013   Procedure: ESOPHAGOGASTRODUODENOSCOPY (EGD);  Surgeon: Ladene Artist, MD;  Location: [facility];  Service: Endoscopy;  Laterality: N/A;  wtih botox  . ESOPHAGOGASTRODUODENOSCOPY (EGD) WITH PROPOFOL N/A 06/28/2014   Procedure: ESOPHAGOGASTRODUODENOSCOPY (EGD) WITH PROPOFOL;  Surgeon: Ladene Artist, MD;  Location: [facility];  Service: Endoscopy;  Laterality: N/A;  . MASTECTOMY Left 1989    There were no vitals filed for this visit.   Subjective Assessment - 03/10/18 1301    Subjective  Pt's daughter states pt has been weaker, and does not do much activity. Pt states she does have dizziness, daughter states daily, pt states weekly. Daughter state BP gets low, minimal other medical issues. She has wheelchair , walker, and cane at home. She reports fall this past saturday at home, alone, got up, and "just fell". She was able to get herself up. No medical alert button. She does stay at home by herself at times.  Had 2 cortisone injections in knees last week, apparently she fell onto knees, causing increased pain, but did not have pain prior to this. She states intermittent pain in R shoulder and back. Daughter would like for pt to be able to increase her activity level.     Patient is accompained by:  Family member  Limitations  Standing;Walking;House hold activities    Patient Stated Goals  improve mobility    Currently in Pain?  No/denies         [facility] PT Assessment - 03/11/18 0001      Assessment   Medical Diagnosis  Weakness, gait    Referring Provider  Dorothyann Peng    Prior Therapy  no      Precautions   Precautions  Fall      Balance Screen   Has the patient fallen in the past 6 months  Yes    How many times?  1    Has the patient had a decrease in activity level because of a fear of  falling?   Yes    Is the patient reluctant to leave their home because of a fear of falling?   No      Prior Function   Level of Independence  Independent      Cognition   Overall Cognitive Status  Within Functional Limits for tasks assessed    Attention  Selective      ROM / Strength   AROM / PROM / Strength  AROM;Strength      AROM   Overall AROM Comments  UE: mild limitations grossly;   LE: mild limitations grossly, moderate limitations for bil knees;       Strength   Overall Strength Comments  UE: 4-/5 gross;  LE: 4/5 gross;       Transfers   Transfers  Sit to Stand    Sit to Stand  7: Independent      Ambulation/Gait   Ambulation/Gait  --   Not observed;      Chief Technology Officer  --   requries full assist               Objective measurements completed on examination: See above findings.              PT Education - 03/11/18 [id number]    Education Details  On home safety, recommendations for use of AD or wheelchair at all times, and for life alert button if pt able to be left alone.     Person(s) Educated  Patient;Caregiver(s)    Methods  Explanation    Comprehension  Verbalized understanding       PT Short Term Goals - 03/11/18 0942      PT SHORT TERM GOAL #1   Title  TBD if pt able to return to PT        PT Long Term Goals - 03/11/18 0943      PT LONG TERM GOAL #1   Title  TBD, if pt able to return to PT             Plan - 03/11/18 0943    Clinical Impression Statement  Due to daughters reports of low energy, BP was taken, Seated at start of session, 80/50, then 90/50, and 105/62 after checking LE strength in seated. Pts BP checked upon standing, decreased to 60/40, pt with symptoms of feeling dizzy, with standing for less than 1 min. Pt returned to seated, rest for 2 min, BP back up to  84/50, then 90/50, asymptomatic upon sitting/rest.   No further assesment of gait or balance done due to significantly low BP  readings. Discussed readings with pt and daughter. Pt was assisted to make Dr. appt today at our clinic, so she would not have to travel to another clinic. Will  let MD assess pt for need to go to ER today. Pts daughter states she has been getting weakner, likely that this may be the cause. Also some concern for fall that she had on Saturday. Pt is a fall risk, and may require PT in the future, when she is medically stable. No further appts scheduled at this time, discussed returning if she is cleared by PCP. Pt and daughter in agreement with plan.     Clinical Presentation  Unstable    Clinical Decision Making  High    Rehab Potential  Fair    PT Frequency  2x / week    PT Duration  6 weeks    PT Treatment/Interventions  ADLs/Self Care Home Management;Cryotherapy;Moist Heat;Therapeutic activities;Functional mobility training;Stair training;Gait training;DME Instruction;Therapeutic exercise;Balance training;Neuromuscular re-education;Patient/family education;Energy conservation;Passive range of motion;Manual techniques;Wheelchair mobility training    PT Next Visit Plan  Pt to be seen by MD prior to returning to PT, IF able, will continue gait and balance assessments, and formulate PT goals.     Recommended Other Services  Recommended pt follow up today with Dr or go to ER.     Consulted and Agree with Plan of Care  Patient;Family member/caregiver       Patient will benefit from skilled therapeutic intervention in order to improve the following deficits and impairments:  Abnormal gait, Decreased endurance, Decreased knowledge of precautions, Decreased activity tolerance, Decreased strength, Difficulty walking, Decreased mobility, Decreased balance, Dizziness, Impaired perceived functional ability, Decreased safety awareness, Decreased coordination  Visit Diagnosis: Other abnormalities of gait and mobility  Muscle weakness (generalized)     Problem List Patient Active Problem List   Diagnosis  Date Noted  . Orthostatic hypotension 03/11/2018  . Acute encephalopathy 03/11/2018  . Bilateral primary osteoarthritis of knee 02/27/2018  . Weight loss 08/30/2017  . S/p left hip fracture 07/12/2017  . Subcoracoid bursitis, right 07/12/2017  . CKD (chronic kidney disease), stage III ([place]) 12/29/2015  . Dizziness 12/29/2015  . Lower urinary tract infectious disease 12/29/2015  . Orthostatic syncope 12/29/2015  . Fall   . Chronic anemia   . Need for prophylactic vaccination against Streptococcus pneumoniae (pneumococcus) 01/06/2015  . Precordial chest pain 11/05/2014  . Dysphagia, pharyngoesophageal phase   . 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 Achalasia 07/07/2013  . Peripheral edema 05/19/2012  . Acute cystitis without hematuria 02/08/2011  . Glaucoma 07/17/2010  . GERD 07/17/2010  . OTHER DYSPHAGIA 07/17/2010  . PROBLEMS WITH SWALLOWING AND MASTICATION 07/11/2010  . IDIOPATHIC URTICARIA 04/11/2010  . ANEMIA, B12 DEFICIENCY 05/18/2008  . OSTEOARTHRITIS 05/12/2007  . DIVERTICULOSIS, COLON 02/26/2007   Lyndee Hensen, PT, DPT 9:54 AM  03/11/18    [facility] [place], [place], [address] Phone: [phone]   Fax:  [phone]  Name: Veronica Phillips MRN: [id number] Date of Birth: [birth year]   PHYSICAL THERAPY DISCHARGE SUMMARY  Visits from Start of Care: 1   Plan: Patient agrees to discharge.  Patient goals were not met. Patient is being discharged due to not returning since the last visit.  ?????    Pt sent to ER on day of Eval. Pt did not return to PT following this.    Lyndee Hensen, PT, DPT 1:18 PM  04/22/18

## 2018-03-11 NOTE — Evaluation (Signed)
Physical Therapy Evaluation Patient Details Name: Veronica Phillips MRN: [id number] DOB: 06/13/1923 Today's Date: 03/11/2018   History of Present Illness  84 yo female with onset of UTI and acute encephalopathy was admitted, has orthostatic presentation initially and earlier BP was elevated, low Ca+.  Has PMHx:  OA, breast CA, glaucoma, HA, HTN, vit B defic, diverticulossi, CKD 3, falls, chest pain, peripheral edema, wgt loss  Clinical Impression  Pt was seen for evaluation of mobility and to increase her control of standing balance.  Pt is tolerant of standing and taking steps, but is fatigued and will need support of her balance to walk with family.  Follow acutely for progressing gait and balance, to attempt stairs and to work on LE strength as able.     Follow Up Recommendations Home health PT;Supervision for mobility/OOB    Equipment Recommendations  None recommended by PT    Recommendations for Other Services       Precautions / Restrictions Precautions Precautions: Fall(telemetry) Precaution Comments: avoid BP L arm Restrictions Weight Bearing Restrictions: No      Mobility  Bed Mobility Overal bed mobility: Needs Assistance Bed Mobility: Supine to Sit;Sit to Supine     Supine to sit: Min assist;Mod assist Sit to supine: Mod assist   General bed mobility comments:  help to sit up with trunk and leg support with trunk to return to bed  Transfers Overall transfer level: Needs assistance Equipment used: Rolling walker (2 wheeled);1 person hand held assist Transfers: Sit to/from Stand Sit to Stand: Min assist         General transfer comment: light support of gait belt to stand  Ambulation/Gait Ambulation/Gait assistance: Min guard Gait Distance (Feet): 100 Feet Assistive device: Rolling walker (2 wheeled);1 person hand held assist Gait Pattern/deviations: Step-to pattern;Step-through pattern;Decreased stride length;Narrow base of support;Trunk flexed Gait  velocity: reduced Gait velocity interpretation: <1.31 ft/sec, indicative of household ambulator General Gait Details: Pt is up to walk with assist to end of hallway and declined to get on stairs today.  Stairs Stairs: (declined)          Wheelchair Mobility    Modified Rankin (Stroke Patients Only)       Balance Overall balance assessment: Needs assistance Sitting-balance support: Feet supported Sitting balance-Leahy Scale: Fair   Postural control: Posterior lean Standing balance support: Bilateral upper extremity supported;During functional activity Standing balance-Leahy Scale: Poor                               Pertinent Vitals/Pain Pain Assessment: No/denies pain    Home Living Family/patient expects to be discharged to:: Private residence Living Arrangements: Children Available Help at Discharge: Family;Available 24 hours/day Type of Home: House Home Access: Stairs to enter Entrance Stairs-Rails: Right;Left;Can reach both Entrance Stairs-Number of Steps: 3 Home Layout: One level Home Equipment: Walker - 2 wheels;Cane - single point      Prior Function Level of Independence: Needs assistance   Gait / Transfers Assistance Needed: I with SPC inside and RW outside  ADL's / Homemaking Assistance Needed: daughter assists her care but pt can dress herself        Hand Dominance   Dominant Hand: Right    Extremity/Trunk Assessment   Upper Extremity Assessment Upper Extremity Assessment: Generalized weakness    Lower Extremity Assessment Lower Extremity Assessment: Generalized weakness    Cervical / Trunk Assessment Cervical / Trunk Assessment: Kyphotic  Communication  Communication: HOH  Cognition Arousal/Alertness: Awake/alert Behavior During Therapy: Flat affect Overall Cognitive Status: Impaired/Different from baseline Area of Impairment: Problem solving;Awareness;Safety/judgement;Following commands                        Following Commands: Follows one step commands with increased time Safety/Judgement: Decreased awareness of safety;Decreased awareness of deficits Awareness: Intellectual Problem Solving: Slow processing;Decreased initiation;Difficulty sequencing;Requires verbal cues;Requires tactile cues General Comments: frequent reminders for hand placement and to use hands to sit      General Comments General comments (skin integrity, edema, etc.): orthostatic BP was noted and check of sitting BP was 108/63, standing was 94/68.      Exercises     Assessment/Plan    PT Assessment Patient needs continued PT services  PT Problem List Decreased strength;Decreased range of motion;Decreased activity tolerance;Decreased balance;Decreased mobility;Decreased coordination;Decreased cognition;Decreased knowledge of use of DME;Decreased safety awareness;Cardiopulmonary status limiting activity       PT Treatment Interventions DME instruction;Gait training;Stair training;Functional mobility training;Therapeutic activities;Therapeutic exercise;Balance training;Neuromuscular re-education;Patient/family education    PT Goals (Current goals can be found in the Care Plan section)  Acute Rehab PT Goals Patient Stated Goal: to walk and get home PT Goal Formulation: With patient/family Time For Goal Achievement: 03/25/18 Potential to Achieve Goals: Good    Frequency Min 3X/week   Barriers to discharge Inaccessible home environment stairs to enter house    Co-evaluation               AM-PAC PT "6 Clicks" Daily Activity  Outcome Measure Difficulty turning over in bed (including adjusting bedclothes, sheets and blankets)?: A Little Difficulty moving from lying on back to sitting on the side of the bed? : Unable Difficulty sitting down on and standing up from a chair with arms (e.g., wheelchair, bedside commode, etc,.)?: Unable Help needed moving to and from a bed to chair (including a wheelchair)?: A  Little Help needed walking in hospital room?: A Little Help needed climbing 3-5 steps with a railing? : A Little 6 Click Score: 14    End of Session Equipment Utilized During Treatment: Gait belt Activity Tolerance: Patient tolerated treatment well;Patient limited by fatigue Patient left: in bed;with call bell/phone within reach;with bed alarm set;with family/visitor present Nurse Communication: Mobility status PT Visit Diagnosis: Unsteadiness on feet (R26.81);Muscle weakness (generalized) (M62.81);Difficulty in walking, not elsewhere classified (R26.2);Other (comment)(orthostasis)    Time: 1191-4782 PT Time Calculation (min) (ACUTE ONLY): 32 min   Charges:   PT Evaluation $PT Eval Moderate Complexity: 1 Mod PT Treatments $Gait Training: 8-22 mins       Ramond Dial 03/11/2018, 1:58 PM  Mee Hives, PT MS Acute Rehab Dept. Number: [place] [facility] [place]

## 2018-03-11 NOTE — Plan of Care (Signed)

## 2018-03-12 DIAGNOSIS — I951 Orthostatic hypotension: Secondary | ICD-10-CM | POA: Diagnosis not present

## 2018-03-12 LAB — RENAL FUNCTION PANEL
Albumin: 2.6 g/dL — ABNORMAL LOW (ref 3.5–5.0)
Anion gap: 5 (ref 5–15)
BUN: 14 mg/dL (ref 8–23)
CO2: 27 mmol/L (ref 22–32)
Calcium: 8.3 mg/dL — ABNORMAL LOW (ref 8.9–10.3)
Chloride: 113 mmol/L — ABNORMAL HIGH (ref 98–111)
Creatinine, Ser: 1.18 mg/dL — ABNORMAL HIGH (ref 0.44–1.00)
GFR calc Af Amer: 45 mL/min — ABNORMAL LOW (ref >60)
GFR calc non Af Amer: 39 mL/min — ABNORMAL LOW (ref >60)
Glucose, Bld: 69 mg/dL — ABNORMAL LOW (ref 70–99)
Phosphorus: 2.9 mg/dL (ref 2.5–4.6)
Potassium: 4.3 mmol/L (ref 3.5–5.1)
Sodium: 145 mmol/L (ref 135–145)

## 2018-03-12 LAB — CBC WITH DIFFERENTIAL/PLATELET
Abs Immature Granulocytes: 0 10*3/uL (ref 0.0–0.1)
Basophils Absolute: 0 10*3/uL (ref 0.0–0.1)
Basophils Relative: 0 %
Eosinophils Absolute: 0 10*3/uL (ref 0.0–0.7)
Eosinophils Relative: 0 %
HCT: 29.6 % — ABNORMAL LOW (ref 36.0–46.0)
Hemoglobin: 9.3 g/dL — ABNORMAL LOW (ref 12.0–15.0)
Immature Granulocytes: 1 %
Lymphocytes Relative: 25 %
Lymphs Abs: 1.2 10*3/uL (ref 0.7–4.0)
MCH: 29.8 pg (ref 26.0–34.0)
MCHC: 31.4 g/dL (ref 30.0–36.0)
MCV: 94.9 fL (ref 78.0–100.0)
Monocytes Absolute: 0.6 10*3/uL (ref 0.1–1.0)
Monocytes Relative: 13 %
Neutro Abs: 3 10*3/uL (ref 1.7–7.7)
Neutrophils Relative %: 61 %
Platelets: 261 10*3/uL (ref 150–400)
RBC: 3.12 MIL/uL — ABNORMAL LOW (ref 3.87–5.11)
RDW: 14.6 % (ref 11.5–15.5)
WBC: 4.9 10*3/uL (ref 4.0–10.5)

## 2018-03-12 LAB — PHOSPHORUS: Phosphorus: 2.9 mg/dL (ref 2.5–4.6)

## 2018-03-12 LAB — MAGNESIUM: Magnesium: 1.9 mg/dL (ref 1.7–2.4)

## 2018-03-12 LAB — ALBUMIN: Albumin: 2.6 g/dL — ABNORMAL LOW (ref 3.5–5.0)

## 2018-03-12 LAB — GLUCOSE, CAPILLARY: Glucose-Capillary: 83 mg/dL (ref 70–99)

## 2018-03-12 NOTE — Progress Notes (Signed)
PROGRESS NOTE    [name]  PJA:[id number] DOB: 11-25-1922 DOA: 03/10/2018 PCP: Dorothyann Peng, NP  Outpatient Specialists:     Brief Narrative: Veronica Phillips is a 84 year old female with past medical history significant for chronic kidney disease stage III, left breast cancer, pernicious anemia, diverticulosis and B12 deficiency.  Patient also has history of orthostatic hypotension and recurrent falls.  Patient was admitted with hypotension, orthostatic symptoms, volume depletion, encephalopathy, electrolyte abnormalities, AKI on chronic kidney disease stage III.  Urine culture is pending.  Currently, patient is on IV Rocephin.  Electrolyte abnormalities are being corrected.  Patient is still on IVF.  Patient is improving.  Hopefully, patient will be discharged back home tomorrow.   Assessment & Plan:   Principal Problem:   Orthostatic hypotension Active Problems:   ANEMIA, B12 DEFICIENCY   Acute cystitis without hematuria   CKD (chronic kidney disease), stage III ([place])   Lower urinary tract infectious disease   Acute encephalopathy   Likely UTI:  Follow the results of urine culture. Continue IV Rocephin for now. Further management will depend on above.  Acute kidney injury on chronic kidney disease stage III: AKI has resolved significantly. AKI is likely secondary to volume depletion. Continue to replete patient's volume.  Volume depletion: Kindly see above.  Acute encephalopathy, likely combined toxic and metabolic: Encephalopathy has improved significantly. Patient is almost back to his baseline.  Hypotension: This is likely multifactorial. On presentation, patient was volume depleted. Likely, patient also has infective process.  Hypotension has resolved.  Orthostasis: See above. Continue to monitor. History of chronic orthostatic hypotension and associated falls noted  Chronic anemia: Continue to monitor.  Hypokalemia: Potassium has been  repleted. Repeat renal panel.  Hypomagnesemia: Repleted. Repeat levels.  Hypocalcemia: Repleted. Continue to monitor.  Further management will depend on hospital course.   DVT prophylaxis: Subcutaneous Lovenox Code Status: Full code Family Communication:  Disposition Plan: Home with PT.   Consultants:   None  Procedures:   None  Antimicrobials:   IV Rocephin.   Subjective: No new complaints. Patient denies fever or chills. Denies shortness of breath. Denies chest pain. Not a particularly good historian.  Objective: Vitals:   03/11/18 1704 03/11/18 1938 03/12/18 0515 03/12/18 1243  BP: (!) 166/95 125/70 (!) 175/83 (!) 159/93  Pulse: 78 82 74 73  Resp: 16 18 18 16   Temp: 98.2 F (36.8 C) 97.7 F (36.5 C) 98.5 F (36.9 C) 98.3 F (36.8 C)  TempSrc: Oral Oral Oral Oral  SpO2: 100% 98% 97% 100%  Weight:   47.5 kg   Height:        Intake/Output Summary (Last 24 hours) at 03/12/2018 1641 Last data filed at 03/12/2018 0808 Gross per 24 hour  Intake 240 ml  Output 3100 ml  Net -2860 ml   Filed Weights   03/11/18 1223 03/12/18 0515  Weight: 48.4 kg 47.5 kg    Examination:  General exam: Appears calm and comfortable. Respiratory system: Clear to auscultation. Respiratory effort normal. Cardiovascular system: S1 & S2 heard Gastrointestinal system: Abdomen is nondistended, soft and nontender. No organomegaly or masses felt. Normal bowel sounds heard. Central nervous system: Alert and oriented. No focal neurological deficits. Extremities: Symmetric 5 x 5 power.   Data Reviewed: I have personally reviewed following labs and imaging studies  CBC: Recent Labs  Lab 03/10/18 1536 03/11/18 0429 03/12/18 1452  WBC 6.3 5.2 4.9  NEUTROABS 4.3  --  3.0  HGB 10.9* 8.9* 9.3*  HCT  34.6* 29.5* 29.6*  MCV 95.3 99.0 94.9  PLT 277 214 462   Basic Metabolic Panel: Recent Labs  Lab 03/10/18 1536 03/11/18 0429 03/11/18 0846 03/12/18 1452  NA 142 145   --  145  K 3.4* 3.0*  --  4.3  CL 104 118*  --  113*  CO2 26 20*  --  27  GLUCOSE 96 85  --  69*  BUN 22 18  --  14  CREATININE 1.66* 1.26*  --  1.18*  CALCIUM 9.0 6.8*  --  8.3*  MG  --   --  1.5* 1.9  PHOS  --   --   --  2.9  2.9   GFR: Estimated Creatinine Clearance: 22.8 mL/min (A) (by C-G formula based on SCr of 1.18 mg/dL (H)). Liver Function Tests: Recent Labs  Lab 03/10/18 1536 03/12/18 1452  AST 22  --   ALT 15  --   ALKPHOS 64  --   BILITOT 0.9  --   PROT 6.6  --   ALBUMIN 3.4* 2.6*  2.6*   No results for input(s): LIPASE, AMYLASE in the last 168 hours. No results for input(s): AMMONIA in the last 168 hours. Coagulation Profile: No results for input(s): INR, PROTIME in the last 168 hours. Cardiac Enzymes: No results for input(s): CKTOTAL, CKMB, CKMBINDEX, TROPONINI in the last 168 hours. BNP (last 3 results) No results for input(s): PROBNP in the last 8760 hours. HbA1C: No results for input(s): HGBA1C in the last 72 hours. CBG: Recent Labs  Lab 03/11/18 1641  GLUCAP 80   Lipid Profile: No results for input(s): CHOL, HDL, LDLCALC, TRIG, CHOLHDL, LDLDIRECT in the last 72 hours. Thyroid Function Tests: No results for input(s): TSH, T4TOTAL, FREET4, T3FREE, THYROIDAB in the last 72 hours. Anemia Panel: No results for input(s): VITAMINB12, FOLATE, FERRITIN, TIBC, IRON, RETICCTPCT in the last 72 hours. Urine analysis:    Component Value Date/Time   COLORURINE YELLOW 03/11/2018 0012   APPEARANCEUR CLOUDY (A) 03/11/2018 0012   LABSPEC 1.011 03/11/2018 0012   PHURINE 5.0 03/11/2018 0012   GLUCOSEU NEGATIVE 03/11/2018 0012   GLUCOSEU NEGATIVE 01/15/2018 1231   HGBUR MODERATE (A) 03/11/2018 0012   HGBUR 2+ 07/11/2010 0921   BILIRUBINUR NEGATIVE 03/11/2018 0012   BILIRUBINUR Negative 01/23/2018 1440   KETONESUR NEGATIVE 03/11/2018 0012   PROTEINUR 30 (A) 03/11/2018 0012   UROBILINOGEN 0.2 01/23/2018 1440   UROBILINOGEN 0.2 01/15/2018 1231   NITRITE  NEGATIVE 03/11/2018 0012   LEUKOCYTESUR LARGE (A) 03/11/2018 0012   Sepsis Labs: @LABRCNTIP (procalcitonin:4,lacticidven:4)  ) Recent Results (from the past 240 hour(s))  Urine culture     Status: Abnormal (Preliminary result)   Collection Time: 03/11/18 12:05 AM  Result Value Ref Range Status   Specimen Description URINE, CLEAN CATCH  Final   Special Requests NONE  Final   Culture (A)  Final    >=100,000 COLONIES/mL KLEBSIELLA PNEUMONIAE SUSCEPTIBILITIES TO FOLLOW Performed at [facility], [place] [address], [place], [place] [id number]    Report Status PENDING  Incomplete         Radiology Studies: Ct Head Wo Contrast  Result Date: 03/10/2018 CLINICAL DATA:  84 year old female with hypotension. Initial encounter. EXAM: CT HEAD WITHOUT CONTRAST TECHNIQUE: Contiguous axial images were obtained from the base of the skull through the vertex without intravenous contrast. COMPARISON:  01/25/2017 CT. FINDINGS: Brain: No intracranial hemorrhage or CT evidence of large acute infarct. No intracranial mass lesion noted on this unenhanced exam. Mild  global atrophy. Vascular: Vascular calcifications.  No hyperdense vessel. Skull: No acute abnormality. Sinuses/Orbits: No acute orbital abnormality. Visualized paranasal sinuses clear. Other: Mastoid air cells and middle ear cavities are clear. IMPRESSION: No acute intracranial abnormality noted. Electronically Signed   By: Genia Del M.D.   On: 03/10/2018 17:18        Scheduled Meds: . enoxaparin (LOVENOX) injection  30 mg Subcutaneous Daily  . ferrous gluconate  325 mg Oral QPC supper  . fluticasone  2 spray Each Nare Daily  . Influenza vac split quadrivalent PF  0.5 mL Intramuscular Tomorrow-1000  . latanoprost  1 drop Both Eyes QHS  . vitamin B-12  1,000 mcg Oral Daily  . vitamin E  400 Units Oral QHS   Continuous Infusions: . cefTRIAXone (ROCEPHIN)  IV 1 g (03/11/18 2203)     LOS: 0 days    Time spent: 35  minutes    Dana Allan, MD  [facility] Pager #: [phone] 7PM-7AM contact night coverage as above

## 2018-03-12 NOTE — Care Management Obs Status (Signed)
[place] NOTIFICATION   Patient Details  Name: Veronica Phillips MRN: [id number] Date of Birth: May 02, 1923   Medicare Observation Status Notification Given:       Carles Collet, RN 03/12/2018, 3:12 PM

## 2018-03-13 DIAGNOSIS — M170 Bilateral primary osteoarthritis of knee: Secondary | ICD-10-CM | POA: Diagnosis not present

## 2018-03-13 DIAGNOSIS — I951 Orthostatic hypotension: Secondary | ICD-10-CM | POA: Diagnosis not present

## 2018-03-13 LAB — RENAL FUNCTION PANEL
Albumin: 2.9 g/dL — ABNORMAL LOW (ref 3.5–5.0)
Anion gap: 8 (ref 5–15)
BUN: 9 mg/dL (ref 8–23)
CO2: 23 mmol/L (ref 22–32)
Calcium: 8.4 mg/dL — ABNORMAL LOW (ref 8.9–10.3)
Chloride: 112 mmol/L — ABNORMAL HIGH (ref 98–111)
Creatinine, Ser: 1.08 mg/dL — ABNORMAL HIGH (ref 0.44–1.00)
GFR calc Af Amer: 50 mL/min — ABNORMAL LOW (ref >60)
GFR calc non Af Amer: 43 mL/min — ABNORMAL LOW (ref >60)
Glucose, Bld: 85 mg/dL (ref 70–99)
Phosphorus: 3.1 mg/dL (ref 2.5–4.6)
Potassium: 4.3 mmol/L (ref 3.5–5.1)
Sodium: 143 mmol/L (ref 135–145)

## 2018-03-13 LAB — GLUCOSE, CAPILLARY
GLUCOSE-CAPILLARY: 109 mg/dL — AB (ref 70–99)
GLUCOSE-CAPILLARY: 91 mg/dL (ref 70–99)
Glucose-Capillary: 73 mg/dL (ref 70–99)
Glucose-Capillary: 93 mg/dL (ref 70–99)

## 2018-03-13 LAB — URINE CULTURE

## 2018-03-13 LAB — MAGNESIUM: Magnesium: 1.7 mg/dL (ref 1.7–2.4)

## 2018-03-13 MED ORDER — BOOST / RESOURCE BREEZE PO LIQD CUSTOM: 1.0000 | Freq: Three times a day (TID) | ORAL | Status: DC

## 2018-03-13 MED ORDER — BOOST BREEZE PO LIQD: 1.0000 | Freq: Three times a day (TID) | ORAL | 1 refills | Status: DC

## 2018-03-13 MED ORDER — ADULT MULTIVITAMIN W/MINERALS CH: 1.0000 | ORAL_TABLET | Freq: Every day | ORAL | Status: DC

## 2018-03-13 NOTE — Progress Notes (Signed)
Initial Nutrition Assessment  DOCUMENTATION CODES:   Underweight, Severe malnutrition in context of chronic illness  INTERVENTION:   -Boost Breeze po TID, each supplement provides 250 kcal and 9 grams of protein -MVI with minerals daily -Rec  NUTRITION DIAGNOSIS:   Severe Malnutrition related to social / environmental circumstances as evidenced by moderate fat depletion, severe fat depletion, moderate muscle depletion, severe muscle depletion.  GOAL:   Patient will meet greater than or equal to 90% of their needs  MONITOR:   PO intake, Supplement acceptance, Labs, Weight trends, Skin, I & O's  REASON FOR ASSESSMENT:   Other (Comment)(low BMI)    ASSESSMENT:   Veronica Phillips Houghis a 84 year old female with past medical history significant for chronic kidney disease stage III, left breast cancer, pernicious anemia, diverticulosis and B12 deficiency.  Patient also has history of orthostatic hypotension and recurrent falls.  RD pulled to pt chart due to low BMI (underweight) and advanced age.   Pt admitted with orthostatic hypotension.   Case discussed with RN, who reports pt may be go home today pending vital signs.   Spoke with pt and daughter at bedside. Pt consumed lunch at time of visit, but only eating small bites of items ("I can't eat without salt"). Pt and daughter share that pt usually consumes 3 meals per day, but often eats small amounts. Per daughter, pt also consumes 1-2 Ensure Clear supplements daily based upon intake (pt does not like Ensure supplements due to lactose intolerance). Meal completion variable during hospitalization; PO: 30-70%.   Pt endorses weight loss since her husband and daughter passed away. However, wt has been stable over the past year. Pt and daughter unable to provide further details regarding weight loss.   Pt appreciate of offer to liberalize diet. Pt and daughter also interested in Boost Breeze supplements.  Labs reviewed: CBGS: 73-109.    NUTRITION - FOCUSED PHYSICAL EXAM:    Most Recent Value  Orbital Region  Moderate depletion  Upper Arm Region  Severe depletion  Thoracic and Lumbar Region  Severe depletion  Buccal Region  Moderate depletion  Temple Region  Moderate depletion  Clavicle Bone Region  Severe depletion  Clavicle and Acromion Bone Region  Severe depletion  Scapular Bone Region  Severe depletion  Dorsal Hand  Severe depletion  Patellar Region  Severe depletion  Anterior Thigh Region  Severe depletion  Posterior Calf Region  Severe depletion  Edema (RD Assessment)  None  Hair  Reviewed  Eyes  Reviewed  Mouth  Reviewed  Skin  Reviewed  Nails  Reviewed       Diet Order:   Diet Order            Diet regular Room service appropriate? Yes; Fluid consistency: Thin  Diet effective now        Diet - low sodium heart healthy              EDUCATION NEEDS:   Education needs have been addressed  Skin:  Skin Assessment: Reviewed RN Assessment  Last BM:  03/12/18  Height:   Ht Readings from Last 1 Encounters:  03/11/18 5\' 3"  (1.6 m)    Weight:   Wt Readings from Last 1 Encounters:  03/13/18 46 kg    Ideal Body Weight:  52.3 kg  BMI:  Body mass index is 17.98 kg/m.  Estimated Nutritional Needs:   Kcal:  1300-1500  Protein:  55-70 grams  Fluid:  1.3-1.5 L    Moriya Mitchell A. Jimmye Norman,  RD, LDN, CDE Pager: [phone] After hours Pager: [phone]

## 2018-03-13 NOTE — Care Management Note (Signed)
Case Management Note  Patient Details  Name: Veronica Phillips MRN: [id number] Date of Birth: [birth year]  Subjective/Objective:  Orthostatic Hypotension                Action/Plan: Patient lives at home with daughter; PCP: Dorothyann Peng, NP; has private insurance with BCBS with prescription drug coverage; pharmacy of choice is [facility]; [place] choice offered, daughter chose [facility]; Dan with [facility] called for arrangements; patient is also requesting a rolling walker with seat - ordered and to be delivered to the room today prior to discharging home; DME - walker, cane and wheel chair at home.  Expected Discharge Date:  03/13/18               Expected Discharge Plan:  [place]  Discharge planning Services  CM Consult  Choice offered to:  Patient, Adult Children  DME Arranged:  Walker rolling with seat DME Agency:  [place]:  PT [facility] Agency:  [place]  Status of Service:  In process, will continue to follow  Sherrilyn Rist [phone] 03/13/2018, 3:15 PM

## 2018-03-13 NOTE — Plan of Care (Signed)
  Problem: Education: Goal: Knowledge of General Education information will improve Description Including pain rating scale, medication(s)/side effects and non-pharmacologic comfort measures Outcome: Progressing   Problem: Clinical Measurements: Goal: Ability to maintain clinical measurements within normal limits will improve 03/13/2018 1331 by Lurline Idol, RN Outcome: Progressing 03/13/2018 1330 by Lurline Idol, RN Outcome: Progressing Goal: Will remain free from infection 03/13/2018 1331 by Lurline Idol, RN Outcome: Progressing 03/13/2018 1330 by Lurline Idol, RN Outcome: Progressing Goal: Diagnostic test results will improve 03/13/2018 1331 by Lurline Idol, RN Outcome: Progressing 03/13/2018 1330 by Lurline Idol, RN Outcome: Progressing Goal: Respiratory complications will improve 03/13/2018 1331 by Lurline Idol, RN Outcome: Progressing 03/13/2018 1330 by Lurline Idol, RN Outcome: Progressing Goal: Cardiovascular complication will be avoided 03/13/2018 1331 by Lurline Idol, RN Outcome: Progressing 03/13/2018 1330 by Lurline Idol, RN Outcome: Progressing   Problem: Health Behavior/Discharge Planning: Goal: Ability to manage health-related needs will improve 03/13/2018 1331 by Lurline Idol, RN Outcome: Progressing 03/13/2018 1330 by Lurline Idol, RN Outcome: Progressing

## 2018-03-13 NOTE — Discharge Summary (Signed)
Physician Discharge Summary  Patient ID: Veronica Phillips MRN: [id number] DOB/AGE: [birth year] 84 y.o.  Admit date: 03/10/2018 Discharge date: 03/13/2018  Admission Diagnoses:  Discharge Diagnoses:  Principal Problem: Acute combined toxic and metabolic encephalopathy. Active Problems:  UTI secondary to Klebsiella pneumonia.  Volume depletion  ANEMIA, B12 DEFICIENCY  Orthostatic hypotension  Recurrent fall   Acute cystitis without hematuria   Acute kidney injury on CKD (chronic kidney disease), stage III [facility])     Discharged Condition: stable  Hospital Course:  Veronica Pehrson Houghis a 84 year old female with past medical history significant for chronic kidney disease stage III, left breast cancer, pernicious anemia, diverticulosis and B12 deficiency.  Patient also has history of orthostatic hypotension and recurrent falls.  Patient was admitted with hypotension, orthostatic symptoms, volume depletion, encephalopathy, electrolyte abnormalities, AKI on chronic kidney disease stage III.    Patient was admitted for further assessment and management.  Patient was volume resuscitated.  Urine culture grew Klebsiella pneumonia.  Patient has been treated with IV Rocephin during the hospital stay.  Patient will not be discharged home on antibiotics.  Abnormal electrolytes have been corrected.  Patient has improved significantly.  Encephalopathy has resolved.  Volume depletion has resolved.  Orthostatic hypotension has resolved.  Physical therapy assessed patient during the hospital stay and advised home health with physical therapy.  Patient has been optimized and will be discharged back home to the care of the primary care provider.     UTI secondary to Klebsiella pneumonia:  Patient was treated with IV Rocephin during the hospital stay.   Patient has completed a course of antibiotics.   Acute encephalopathy has resolved significantly.   Acute kidney injury on chronic kidney disease stage III: AKI has  resolved significantly. AKI is likely secondary to volume depletion. Continue to replete patient's volume.  Volume depletion: Kindly see above.  Acute combined toxic and metabolic encephalopathy:  Likely multifactorial, including UTI, volume depletion, and acute kidney injury. Encephalopathy has improved significantly. Patient is back to baseline.  Hypotension: This has resolved with volume resuscitation.   Orthostasis/orthostatic hypotension: See above. History of chronic orthostatic hypotension and associated falls noted Resolved significantly.  Chronic anemia: Continue to monitor.  Hypokalemia: Repeated.  Hypomagnesemia: Repleted.    Hypocalcemia: Repleted.  .  Consults: None  Significant Diagnostic Studies: microbiology: urine culture: positive for Klebsiella pneumonia  Discharge Exam: Blood pressure (!) 173/91, pulse 73, temperature 98.7 F (37.1 C), temperature source Oral, resp. rate 18, height 5\' 3"  (1.6 m), weight 46 kg, SpO2 98 %.   Disposition: Discharge disposition: 06-Home-Health Care Svc       Discharge Instructions    Diet - low sodium heart healthy   Complete by:  As directed    Increase activity slowly   Complete by:  As directed      Allergies as of 03/13/2018      Reactions   Septra [bactrim] Swelling   Site of swelling not recalled by the patient   Sulfa Antibiotics Swelling   Site of swelling not recalled by the patient   Tape Other (See Comments)   Skin is sensitive; please use paper tape!!      Medication List    STOP taking these medications   ciprofloxacin 500 MG tablet Commonly known as:  CIPRO   CRANBERRY PO   LUTEIN PO   mirtazapine 15 MG disintegrating tablet Commonly known as:  REMERON SOL-TAB   MYLANTA 347-425-95 MG/5ML suspension Generic drug:  alum & mag hydroxide-simeth  traMADol 50 MG tablet Commonly known as:  ULTRAM     TAKE these medications   acetaminophen 500 MG tablet Commonly  known as:  TYLENOL Take 500 mg by mouth every 6 (six) hours as needed (for pain).   feeding supplement (BOOST BREEZE) Liqd Take 1 Can by mouth 3 (three) times daily.   ferrous gluconate 325 MG tablet Commonly known as:  FERGON Take 325 mg by mouth daily after supper.   fluticasone 50 MCG/ACT nasal spray Commonly known as:  FLONASE Place 2 sprays into both nostrils daily.   latanoprost 0.005 % ophthalmic solution Commonly known as:  XALATAN Place 1 drop into both eyes at bedtime.   ONE-A-DAY WOMENS 50 PLUS PO Take 1 tablet by mouth daily after supper.   VITAMIN B-12 PO Take 1 tablet by mouth at bedtime.   VITAMIN C PO Take 1 tablet by mouth at bedtime.   vitamin E 400 UNIT capsule Take 400 Units by mouth at bedtime.      Follow-up Information    Dorothyann Peng, NP.   Specialty:  Family Medicine Contact information: [address] [place] [address] [phone]           Signed: Bonnell Public 03/13/2018, 2:06 PM

## 2018-03-14 DIAGNOSIS — Z87440 Personal history of urinary (tract) infections: Secondary | ICD-10-CM | POA: Diagnosis not present

## 2018-03-14 DIAGNOSIS — K219 Gastro-esophageal reflux disease without esophagitis: Secondary | ICD-10-CM | POA: Diagnosis not present

## 2018-03-14 DIAGNOSIS — I951 Orthostatic hypotension: Secondary | ICD-10-CM | POA: Diagnosis not present

## 2018-03-14 DIAGNOSIS — Z853 Personal history of malignant neoplasm of breast: Secondary | ICD-10-CM | POA: Diagnosis not present

## 2018-03-14 DIAGNOSIS — I129 Hypertensive chronic kidney disease with stage 1 through stage 4 chronic kidney disease, or unspecified chronic kidney disease: Secondary | ICD-10-CM | POA: Diagnosis not present

## 2018-03-14 DIAGNOSIS — D500 Iron deficiency anemia secondary to blood loss (chronic): Secondary | ICD-10-CM | POA: Diagnosis not present

## 2018-03-14 DIAGNOSIS — K5790 Diverticulosis of intestine, part unspecified, without perforation or abscess without bleeding: Secondary | ICD-10-CM | POA: Diagnosis not present

## 2018-03-14 DIAGNOSIS — R296 Repeated falls: Secondary | ICD-10-CM | POA: Diagnosis not present

## 2018-03-14 DIAGNOSIS — N183 Chronic kidney disease, stage 3 (moderate): Secondary | ICD-10-CM | POA: Diagnosis not present

## 2018-03-18 ENCOUNTER — Telehealth: Payer: Self-pay | Admitting: Adult Health

## 2018-03-18 NOTE — Telephone Encounter (Signed)
Copied from [place] [id number]. Topic: General - Other >> Mar 18, 2018  3:28 PM [name] wrote: Reason for CRM: pt was seen earlier today with low BP 88/54. Pt had no complaints or symptoms. Pt has hx of orthostatic hypotension. Pts daughter gave pt Gatorade. Pt had recent hospitalization. Please advise of follow up needed. Secure VM if no answer. Please also f/u with patient.

## 2018-03-19 NOTE — Telephone Encounter (Signed)
Daughter is aware 

## 2018-03-19 NOTE — Telephone Encounter (Signed)
Continue to monitor. It is very important that she stay well hydrated. She has an appointment scheduled with me in two days

## 2018-03-21 ENCOUNTER — Encounter: Payer: Self-pay | Admitting: Adult Health

## 2018-03-21 ENCOUNTER — Other Ambulatory Visit: Payer: Self-pay | Admitting: Adult Health

## 2018-03-21 ENCOUNTER — Ambulatory Visit: Payer: Medicare Other | Admitting: Adult Health

## 2018-03-21 VITALS — BP 118/70 | HR 77 | Temp 98.4°F | Ht 63.0 in | Wt 102.0 lb

## 2018-03-21 DIAGNOSIS — I951 Orthostatic hypotension: Secondary | ICD-10-CM

## 2018-03-21 DIAGNOSIS — G9340 Encephalopathy, unspecified: Secondary | ICD-10-CM

## 2018-03-21 DIAGNOSIS — N183 Chronic kidney disease, stage 3 unspecified: Secondary | ICD-10-CM

## 2018-03-21 DIAGNOSIS — N3000 Acute cystitis without hematuria: Secondary | ICD-10-CM

## 2018-03-21 DIAGNOSIS — N390 Urinary tract infection, site not specified: Secondary | ICD-10-CM | POA: Diagnosis not present

## 2018-03-21 LAB — POCT URINALYSIS DIPSTICK
BILIRUBIN UA: NEGATIVE
GLUCOSE UA: NEGATIVE
Ketones, UA: NEGATIVE
Nitrite, UA: NEGATIVE
Protein, UA: POSITIVE — AB
Spec Grav, UA: 1.015 (ref 1.010–1.025)
Urobilinogen, UA: 0.2 E.U./dL
pH, UA: 6 (ref 5.0–8.0)

## 2018-03-21 NOTE — Progress Notes (Signed)
Subjective:    Patient ID: Veronica Phillips, female    DOB: 1923-04-06, 84 y.o.   MRN: [id number]  HPI  84 year old female who  has a past medical history of Arthritis, Atrophic gastritis, Cancer of left breast ([place]) (1989), CKD (chronic kidney disease), stage III ([place]), Diverticulosis of colon, GERD (gastroesophageal reflux disease), Glaucoma, Headache, Hypertension, Lactose intolerance, Pernicious anemia, and Vitamin B 12 deficiency. She presents with her daughter for TCM visit   Admit Date: 03/10/2018 Discharge Date: 03/13/2018   Presented to the emergency room after being at physical therapy and found to have a low blood pressure with a systolic in the 41U.  Patient with a history of orthostatic hypertension and recurrent falls, her daughter reported frequent falls when she was trying to walk, her last fall was 4 days prior to the emergency room visit.  She did not have any loss of consciousness or hit her head.  In the ER is difficult to get orthostatic since the patient became dizzy when trying to stand.  She was given a fluid bolus and labs also revealed UTI she was started on ceftriaxone for.  She became mildly confused in the emergency room, CT of the head was unremarkable  She was  admitted for hypotension, orthostatic symptoms, volume depletion, encephalopathy, electrolyte abnormalities, AKA on chronic kidney disease stage III.  During her hospital visit she was volume resuscitated, her urine culture grew Klebsiella pneumonia he was treated with IV Rocephin during her hospital stay.  She was not discharged home on antibiotics.  Upon discharge abnormal elect lites have been corrected and she improved significantly.  Encephalopathy had resolved.  Orthostatic hypotension had also resolved.  Was assessed by physical therapy during her hospital stay and was advised home health with PT.  Today in the office she reports that she is feeling back to baseline. Her daughter has been checking her BP at  home, she has had BP readings 78/48 - 173/86, with an average of 130's/80's. She continues to feel dizzy with position changes, especially when doing this quickly.   Her daughter has been trying to for fluids.   Review of Systems  Constitutional: Negative.   HENT: Negative.   Eyes: Negative.   Respiratory: Negative.   Cardiovascular: Negative.   Endocrine: Negative.   Musculoskeletal: Positive for arthralgias and back pain.  Skin: Negative.   Allergic/Immunologic: Negative.   Neurological: Positive for dizziness, light-headedness and headaches.   Past Medical History:  Diagnosis Date  . Arthritis    "knees, shoulders" (12/29/2015)  . Atrophic gastritis   . Cancer of left breast ([place]) 1989  . CKD (chronic kidney disease), stage III ([place])   . Diverticulosis of colon   . GERD (gastroesophageal reflux disease)   . Glaucoma   . Headache    "awful headaches when I was coming up in my teens"  . Hypertension   . Lactose intolerance   . Pernicious anemia   . Vitamin B 12 deficiency     Social History   Socioeconomic History  . Marital status: Widowed    Spouse name: Not on file  . Number of children: Not on file  . Years of education: Not on file  . Highest education level: Not on file  Occupational History  . Not on file  Social Needs  . Financial resource strain: Not on file  . Food insecurity:    Worry: Not on file    Inability: Not on file  . Transportation needs:  Medical: Not on file    Non-medical: Not on file  Tobacco Use  . Smoking status: Never Smoker  . Smokeless tobacco: Never Used  Substance and Sexual Activity  . Alcohol use: No  . Drug use: No  . Sexual activity: Not Currently  Lifestyle  . Physical activity:    Days per week: Not on file    Minutes per session: Not on file  . Stress: Not on file  Relationships  . Social connections:    Talks on phone: Not on file    Gets together: Not on file    Attends religious service: Not on file     Active member of club or organization: Not on file    Attends meetings of clubs or organizations: Not on file    Relationship status: Not on file  . Intimate partner violence:    Fear of current or ex partner: Not on file    Emotionally abused: Not on file    Physically abused: Not on file    Forced sexual activity: Not on file  Other Topics Concern  . 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 BALLOON DILATION N/A 06/28/2014   Procedure: BALLOON DILATION;  Surgeon: Ladene Artist, MD;  Location: [facility];  Service: Endoscopy;  Laterality: N/A;  . BOTOX INJECTION N/A 07/09/2013   Procedure: BOTOX INJECTION;  Surgeon: Ladene Artist, MD;  Location: [facility];  Service: Endoscopy;  Laterality: N/A;  . BOTOX INJECTION N/A 06/28/2014   Procedure: BOTOX INJECTION;  Surgeon: Ladene Artist, MD;  Location: [facility];  Service: Endoscopy;  Laterality: N/A;  . CATARACT EXTRACTION W/ INTRAOCULAR LENS  IMPLANT, BILATERAL Bilateral 08-2015-10/2015  . ESOPHAGEAL MANOMETRY N/A 10/27/2012   Procedure: ESOPHAGEAL MANOMETRY (EM);  Surgeon: Ladene Artist, MD;  Location: [facility];  Service: Endoscopy;  Laterality: N/A;  . ESOPHAGOGASTRODUODENOSCOPY N/A 07/09/2013   Procedure: ESOPHAGOGASTRODUODENOSCOPY (EGD);  Surgeon: Ladene Artist, MD;  Location: [facility];  Service: Endoscopy;  Laterality: N/A;  . ESOPHAGOGASTRODUODENOSCOPY N/A 10/07/2013   Procedure: ESOPHAGOGASTRODUODENOSCOPY (EGD);  Surgeon: Ladene Artist, MD;  Location: [facility];  Service: Endoscopy;  Laterality: N/A;  wtih botox  . ESOPHAGOGASTRODUODENOSCOPY (EGD) WITH PROPOFOL N/A 06/28/2014   Procedure: ESOPHAGOGASTRODUODENOSCOPY (EGD) WITH PROPOFOL;  Surgeon: Ladene Artist, MD;  [facility];  Service: Endoscopy;  Laterality: N/A;  . MASTECTOMY Left 1989    Family History  Problem Relation Age of Onset  . Hypertension  Mother        family hx  . Lung cancer Father        family hx  . Stroke Unknown        family hx  . Heart disease Sister        family hx    Allergies  Allergen Reactions  . Septra [Bactrim] Swelling    Site of swelling not recalled by the patient  . Sulfa Antibiotics Swelling    Site of swelling not recalled by the patient  . Tape Other (See Comments)    Skin is sensitive; please use paper tape!!    Current Outpatient Medications on File Prior to Visit  Medication Sig Dispense Refill  . acetaminophen (TYLENOL) 500 MG tablet Take 500 mg by mouth every 6 (six) hours as needed (for pain).     . Ascorbic Acid (VITAMIN C PO) Take 1  tablet by mouth at bedtime.     . Cyanocobalamin (VITAMIN B-12 PO) Take 1 tablet by mouth at bedtime.     . ferrous gluconate (FERGON) 325 MG tablet Take 325 mg by mouth daily after supper.     . fluticasone (FLONASE) 50 MCG/ACT nasal spray Place 2 sprays into both nostrils daily. 16 g 6  . latanoprost (XALATAN) 0.005 % ophthalmic solution Place 1 drop into both eyes at bedtime.   0  . Multiple Vitamins-Minerals (ONE-A-DAY WOMENS 50 PLUS PO) Take 1 tablet by mouth daily after supper.     . Nutritional Supplements (FEEDING SUPPLEMENT, BOOST BREEZE,) LIQD Take 1 Can by mouth 3 (three) times daily. 90 Can 1  . vitamin E 400 UNIT capsule Take 400 Units by mouth at bedtime.      No current facility-administered medications on file prior to visit.     BP 118/70 (BP Location: Right Arm, Patient Position: Sitting, Cuff Size: Normal)   Pulse 77   Temp 98.4 F (36.9 C) (Oral)   Ht 5\' 3"  (1.6 m)   Wt 102 lb (46.3 kg)   SpO2 97%   BMI 18.07 kg/m       Objective:   Physical Exam  Constitutional: She is oriented to person, place, and time. She appears well-developed and well-nourished. No distress.  Cardiovascular: Normal rate, regular rhythm, normal heart sounds and intact distal pulses.  Pulmonary/Chest: Effort normal and breath sounds normal.    Musculoskeletal:  Sitting in wheelchair for exam    Neurological: She is alert and oriented to person, place, and time.  Skin: Skin is warm and dry.  Nursing note and vitals reviewed.      Assessment & Plan:  1. Orthostatic hypotension - Encouraged to drink plenty of fluids throughout the day  - Can add salt to diet  - Get up slowly   2. Acute encephalopathy - Resolved   3. CKD (chronic kidney disease), stage III ([facility]) - Back to baseline   4. Lower urinary tract infectious disease - Will recheck UA  - POC Urinalysis Dipstick   Dorothyann Peng, NP

## 2018-03-21 NOTE — Addendum Note (Signed)
Addended by: Rene Kocher on: 03/21/2018 03:09 PM   Modules accepted: Orders

## 2018-03-24 ENCOUNTER — Other Ambulatory Visit: Payer: Self-pay | Admitting: Adult Health

## 2018-03-24 LAB — URINE CULTURE
MICRO NUMBER: 91164408
SPECIMEN QUALITY:: ADEQUATE

## 2018-03-24 MED ORDER — CIPROFLOXACIN HCL 500 MG PO TABS: 500.0000 mg | ORAL_TABLET | Freq: Two times a day (BID) | ORAL | 0 refills | Status: DC

## 2018-04-09 ENCOUNTER — Ambulatory Visit: Payer: Medicare Other | Admitting: Adult Health

## 2018-04-09 ENCOUNTER — Encounter: Payer: Self-pay | Admitting: Adult Health

## 2018-04-09 VITALS — BP 160/80 | HR 82

## 2018-04-09 DIAGNOSIS — R350 Frequency of micturition: Secondary | ICD-10-CM

## 2018-04-09 NOTE — Progress Notes (Signed)
Subjective:    Patient ID: Veronica Phillips, female    DOB: 1922/10/15, 84 y.o.   MRN: [id number]  HPI  84 year old female who  has a past medical history of Arthritis, Atrophic gastritis, Cancer of left breast ([place]) (1989), CKD (chronic kidney disease), stage III ([place]), Diverticulosis of colon, GERD (gastroesophageal reflux disease), Glaucoma, Headache, Hypertension, Lactose intolerance, Pernicious anemia, and Vitamin B 12 deficiency.  She presents to the office today for follow up regarding recent UTI. She was last seen on 03/21/2018 which showed that she continued to have a UTI after being in the hospital and treated for UTI. Urine culture showed that she was sensitive to cipro. She reports that her only symptom is that of frequency. She denies any burning with urination or confusion.   Review of Systems See HPI   Past Medical History:  Diagnosis Date  . Arthritis    "knees, shoulders" (12/29/2015)  . Atrophic gastritis   . Cancer of left breast ([place]) 1989  . CKD (chronic kidney disease), stage III ([place])   . Diverticulosis of colon   . GERD (gastroesophageal reflux disease)   . Glaucoma   . Headache    "awful headaches when I was coming up in my teens"  . Hypertension   . Lactose intolerance   . Pernicious anemia   . Vitamin B 12 deficiency     Social History   Socioeconomic History  . Marital status: Widowed    Spouse name: Not on file  . Number of children: Not on file  . Years of education: Not on file  . Highest education level: Not on file  Occupational History  . Not on file  Social Needs  . Financial resource strain: Not on file  . Food insecurity:    Worry: Not on file    Inability: Not on file  . Transportation needs:    Medical: Not on file    Non-medical: Not on file  Tobacco Use  . Smoking status: Never Smoker  . Smokeless tobacco: Never Used  Substance and Sexual Activity  . Alcohol use: No  . Drug use: No  . Sexual activity: Not Currently    Lifestyle  . Physical activity:    Days per week: Not on file    Minutes per session: Not on file  . Stress: Not on file  Relationships  . Social connections:    Talks on phone: Not on file    Gets together: Not on file    Attends religious service: Not on file    Active member of club or organization: Not on file    Attends meetings of clubs or organizations: Not on file    Relationship status: Not on file  . Intimate partner violence:    Fear of current or ex partner: Not on file    Emotionally abused: Not on file    Physically abused: Not on file    Forced sexual activity: Not on file  Other Topics Concern  . 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 BALLOON DILATION N/A 06/28/2014   Procedure: BALLOON DILATION;  Surgeon: Ladene Artist, MD;  [facility];  Service: Endoscopy;  Laterality: N/A;  . BOTOX INJECTION N/A 07/09/2013   Procedure: BOTOX INJECTION;  Surgeon: Ladene Artist, MD;  [facility]: [facility];  Service: Endoscopy;  Laterality:  N/A;  . BOTOX INJECTION N/A 06/28/2014   Procedure: BOTOX INJECTION;  Surgeon: Ladene Artist, MD;  [facility];  Service: Endoscopy;  Laterality: N/A;  . CATARACT EXTRACTION W/ INTRAOCULAR LENS  IMPLANT, BILATERAL Bilateral 08-2015-10/2015  . ESOPHAGEAL MANOMETRY N/A 10/27/2012   Procedure: ESOPHAGEAL MANOMETRY (EM);  Surgeon: Ladene Artist, MD;  [facility];  Service: Endoscopy;  Laterality: N/A;  . ESOPHAGOGASTRODUODENOSCOPY N/A 07/09/2013   Procedure: ESOPHAGOGASTRODUODENOSCOPY (EGD);  Surgeon: Ladene Artist, MD;  Location: [facility];  Service: Endoscopy;  Laterality: N/A;  . ESOPHAGOGASTRODUODENOSCOPY N/A 10/07/2013   Procedure: ESOPHAGOGASTRODUODENOSCOPY (EGD);  Surgeon: Ladene Artist, MD;  Location: [facility];  Service: Endoscopy;  Laterality: N/A;  wtih botox  . ESOPHAGOGASTRODUODENOSCOPY (EGD) WITH  PROPOFOL N/A 06/28/2014   Procedure: ESOPHAGOGASTRODUODENOSCOPY (EGD) WITH PROPOFOL;  Surgeon: Ladene Artist, MD;  [facility];  Service: Endoscopy;  Laterality: N/A;  . MASTECTOMY Left 1989    Family History  Problem Relation Age of Onset  . Hypertension Mother        family hx  . Lung cancer Father        family hx  . Stroke Unknown        family hx  . Heart disease Sister        family hx    Allergies  Allergen Reactions  . Septra [Bactrim] Swelling    Site of swelling not recalled by the patient  . Sulfa Antibiotics Swelling    Site of swelling not recalled by the patient  . Tape Other (See Comments)    Skin is sensitive; please use paper tape!!    Current Outpatient Medications on File Prior to Visit  Medication Sig Dispense Refill  . acetaminophen (TYLENOL) 500 MG tablet Take 500 mg by mouth every 6 (six) hours as needed (for pain).     . Ascorbic Acid (VITAMIN C PO) Take 1 tablet by mouth at bedtime.     . Cyanocobalamin (VITAMIN B-12 PO) Take 1 tablet by mouth at bedtime.     . ferrous gluconate (FERGON) 325 MG tablet Take 325 mg by mouth daily after supper.     . fluticasone (FLONASE) 50 MCG/ACT nasal spray Place 2 sprays into both nostrils daily. 16 g 6  . latanoprost (XALATAN) 0.005 % ophthalmic solution Place 1 drop into both eyes at bedtime.   0  . Multiple Vitamins-Minerals (ONE-A-DAY WOMENS 50 PLUS PO) Take 1 tablet by mouth daily after supper.     . Nutritional Supplements (FEEDING SUPPLEMENT, BOOST BREEZE,) LIQD Take 1 Can by mouth 3 (three) times daily. 90 Can 1  . vitamin E 400 UNIT capsule Take 400 Units by mouth at bedtime.      No current facility-administered medications on file prior to visit.     BP (!) 160/80   Pulse 82   SpO2 97%       Objective:   Physical Exam  Constitutional: She is oriented to person, place, and time. She appears well-developed and well-nourished. No distress.  Cardiovascular: Normal rate, regular rhythm,  normal heart sounds and intact distal pulses.  Pulmonary/Chest: Effort normal and breath sounds normal.  Neurological: She is alert and oriented to person, place, and time.  Skin: Skin is warm and dry. She is not diaphoretic.  Psychiatric: She has a normal mood and affect. Her behavior is normal. Judgment and thought content normal.  Nursing note and vitals reviewed.     Assessment & Plan:  1. Urinary frequency -  POCT Urinalysis Dipstick (Automated) - Urine Culture - Consider referral to urology   Dorothyann Peng, NP

## 2018-04-17 DIAGNOSIS — R350 Frequency of micturition: Secondary | ICD-10-CM | POA: Diagnosis not present

## 2018-04-17 LAB — URINALYSIS, ROUTINE W REFLEX MICROSCOPIC
Ketones, ur: NEGATIVE
NITRITE: NEGATIVE
SPECIFIC GRAVITY, URINE: 1.025 (ref 1.000–1.030)
Total Protein, Urine: 30 — AB
Urine Glucose: NEGATIVE
Urobilinogen, UA: 0.2 (ref 0.0–1.0)
pH: 6 (ref 5.0–8.0)

## 2018-04-17 NOTE — Addendum Note (Signed)
Addended by: Elmer Picker on: 04/17/2018 02:55 PM   Modules accepted: Orders

## 2018-04-20 LAB — URINE CULTURE
MICRO NUMBER: 91280952
SPECIMEN QUALITY:: ADEQUATE

## 2018-05-07 ENCOUNTER — Encounter: Payer: Self-pay | Admitting: Adult Health

## 2018-05-07 ENCOUNTER — Ambulatory Visit: Payer: Medicare Other | Admitting: Adult Health

## 2018-05-07 VITALS — BP 132/70 | HR 78 | Temp 97.6°F

## 2018-05-07 DIAGNOSIS — R410 Disorientation, unspecified: Secondary | ICD-10-CM | POA: Diagnosis not present

## 2018-05-07 DIAGNOSIS — H401131 Primary open-angle glaucoma, bilateral, mild stage: Secondary | ICD-10-CM | POA: Diagnosis not present

## 2018-05-07 NOTE — Progress Notes (Signed)
Subjective:    Patient ID: Veronica Phillips, female    DOB: 06-01-1923, 84 y.o.   MRN: [id number]  HPI  84 year old female who  has a past medical history of Arthritis, Atrophic gastritis, Cancer of left breast ([place]) (1989), CKD (chronic kidney disease), stage III ([place]), Diverticulosis of colon, GERD (gastroesophageal reflux disease), Glaucoma, Headache, Hypertension, Lactose intolerance, Pernicious anemia, and Vitamin B 12 deficiency.   She presents to the office today with her daughter and caregiver for the acute complaint of general confusion.  Her daughter states that this is been happening for approximately the last 3 months but over the weekend became worse.  Over the weekend around 10 PM she wanted to go outside for an unknown reason while in her pajamas.  When asked if she remembered this incident she stated that she did she is unsure why she wanted to go.  Her daughter reports that she might be suffering from some visual hallucinations but is unsure because they seem to be happening more when she wakes up from sleeping.  The daughter is not sure if this is hallucinations or dream.  Does report that over the weekend her mother more jittery than normal and she was given a Phillips-dose Ativan prior to the incident.  Poke to [place] son on the phone who is with her over the weekend and he reports generalized confusion and hallucinating or imagining things that did not happen.  The example that he gave her this was in people were telling Jacoby that something was going on, that an accident happened outside of her home and that small children came up to the house that they had more information on the accident. Her son ports that Greta wanted to find out what was going on and when her son told her that nothing had happened she stated "while I guess I would believe you if you say so".  He has a history of chronic UTIs that have been more frequent over the last few months.  There is no known family history  of dementia  CT scan of head on 03/10/2018 showed  FINDINGS: Brain: No intracranial hemorrhage or CT evidence of large acute infarct. No intracranial mass lesion noted on this unenhanced exam. Mild global atrophy.  Vascular: Vascular calcifications.  No hyperdense vessel.  Skull: No acute abnormality.  Sinuses/Orbits: No acute orbital abnormality. Visualized paranasal sinuses clear.  Other: Mastoid air cells and middle ear cavities are clear.  IMPRESSION: No acute intracranial abnormality noted.  Review of Systems See HPI   Past Medical History:  Diagnosis Date  . Arthritis    "knees, shoulders" (12/29/2015)  . Atrophic gastritis   . Cancer of left breast ([place]) 1989  . CKD (chronic kidney disease), stage III ([place])   . Diverticulosis of colon   . GERD (gastroesophageal reflux disease)   . Glaucoma   . Headache    "awful headaches when I was coming up in my teens"  . Hypertension   . Lactose intolerance   . Pernicious anemia   . Vitamin B 12 deficiency     Social History   Socioeconomic History  . Marital status: Widowed    Spouse name: Not on file  . Number of children: Not on file  . Years of education: Not on file  . Highest education level: Not on file  Occupational History  . Not on file  Social Needs  . Financial resource strain: Not on file  . Food insecurity:  Worry: Not on file    Inability: Not on file  . Transportation needs:    Medical: Not on file    Non-medical: Not on file  Tobacco Use  . Smoking status: Never Smoker  . Smokeless tobacco: Never Used  Substance and Sexual Activity  . Alcohol use: No  . Drug use: No  . Sexual activity: Not Currently  Lifestyle  . Physical activity:    Days per week: Not on file    Minutes per session: Not on file  . Stress: Not on file  Relationships  . Social connections:    Talks on phone: Not on file    Gets together: Not on file    Attends religious service: Not on file    Active member  of club or organization: Not on file    Attends meetings of clubs or organizations: Not on file    Relationship status: Not on file  . Intimate partner violence:    Fear of current or ex partner: Not on file    Emotionally abused: Not on file    Physically abused: Not on file    Forced sexual activity: Not on file  Other Topics Concern  . 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 BALLOON DILATION N/A 06/28/2014   Procedure: BALLOON DILATION;  Surgeon: Ladene Artist, MD;  Location: [facility];  Service: Endoscopy;  Laterality: N/A;  . BOTOX INJECTION N/A 07/09/2013   Procedure: BOTOX INJECTION;  Surgeon: Ladene Artist, MD;  Location: [facility];  Service: Endoscopy;  Laterality: N/A;  . BOTOX INJECTION N/A 06/28/2014   Procedure: BOTOX INJECTION;  Surgeon: Ladene Artist, MD;  [facility]: [facility];  Service: Endoscopy;  Laterality: N/A;  . CATARACT EXTRACTION W/ INTRAOCULAR LENS  IMPLANT, BILATERAL Bilateral 08-2015-10/2015  . ESOPHAGEAL MANOMETRY N/A 10/27/2012   Procedure: ESOPHAGEAL MANOMETRY (EM);  Surgeon: Ladene Artist, MD;  [facility];  Service: Endoscopy;  Laterality: N/A;  . ESOPHAGOGASTRODUODENOSCOPY N/A 07/09/2013   Procedure: ESOPHAGOGASTRODUODENOSCOPY (EGD);  Surgeon: Ladene Artist, MD;  Location: [facility];  Service: Endoscopy;  Laterality: N/A;  . ESOPHAGOGASTRODUODENOSCOPY N/A 10/07/2013   Procedure: ESOPHAGOGASTRODUODENOSCOPY (EGD);  Surgeon: Ladene Artist, MD;  Location: [facility];  Service: Endoscopy;  Laterality: N/A;  wtih botox  . ESOPHAGOGASTRODUODENOSCOPY (EGD) WITH PROPOFOL N/A 06/28/2014   Procedure: ESOPHAGOGASTRODUODENOSCOPY (EGD) WITH PROPOFOL;  Surgeon: Ladene Artist, MD;  [facility];  Service: Endoscopy;  Laterality: N/A;  . MASTECTOMY Left 1989    Family History  Problem Relation Age of Onset  . Hypertension Mother         family hx  . Lung cancer Father        family hx  . Stroke Unknown        family hx  . Heart disease Sister        family hx    Allergies  Allergen Reactions  . Septra [Bactrim] Swelling    Site of swelling not recalled by the patient  . Sulfa Antibiotics Swelling    Site of swelling not recalled by the patient  . Tape Other (See Comments)    Skin is sensitive; please use paper tape!!    Current Outpatient Medications on File Prior to Visit  Medication Sig Dispense Refill  . acetaminophen (TYLENOL) 500 MG tablet Take 500 mg by mouth every 6 (  six) hours as needed (for pain).     . Ascorbic Acid (VITAMIN C PO) Take 1 tablet by mouth at bedtime.     . Cyanocobalamin (VITAMIN B-12 PO) Take 1 tablet by mouth at bedtime.     . ferrous gluconate (FERGON) 325 MG tablet Take 325 mg by mouth daily after supper.     . fluticasone (FLONASE) 50 MCG/ACT nasal spray Place 2 sprays into both nostrils daily. 16 g 6  . latanoprost (XALATAN) 0.005 % ophthalmic solution Place 1 drop into both eyes at bedtime.   0  . Multiple Vitamins-Minerals (ONE-A-DAY WOMENS 50 PLUS PO) Take 1 tablet by mouth daily after supper.     . Nutritional Supplements (FEEDING SUPPLEMENT, BOOST BREEZE,) LIQD Take 1 Can by mouth 3 (three) times daily. 90 Can 1  . vitamin E 400 UNIT capsule Take 400 Units by mouth at bedtime.      No current facility-administered medications on file prior to visit.     BP 132/70   Pulse 78   Temp 97.6 F (36.4 C)   SpO2 96%       Objective:   Physical Exam  Constitutional: She is oriented to person, place, and time. She appears well-developed and well-nourished. No distress.  HENT:  Head: Normocephalic and atraumatic.  Right Ear: External ear normal.  Left Ear: External ear normal.  Nose: Nose normal.  Mouth/Throat: Oropharynx is clear and moist. No oropharyngeal exudate.  Cardiovascular: Normal rate, regular rhythm, normal heart sounds and intact distal pulses. Exam reveals no  gallop and no friction rub.  No murmur heard. Pulmonary/Chest: Effort normal and breath sounds normal. No stridor. No respiratory distress. She has no wheezes. She has no rales. She exhibits no tenderness.  Neurological: She is alert and oriented to person, place, and time.  Skin: Skin is warm and dry. She is not diaphoretic.  Psychiatric: She has a normal mood and affect. Her speech is normal and behavior is normal. Judgment and thought content normal. Cognition and memory are normal.  Nursing note and vitals reviewed.     Assessment & Plan:  1. Confusion  MMSE:21/30.  -She was unable to name the year or the day of the week, difficulty with delayed verbal recall as well as writing and copying. - Will check Urine Culture and possible place on extended course of abx. It was thought recently that she urine may have been colonized. Will repeat exam 2-3 weeks after starting abx therapy  - Cannot r/o cause being ativan at this time.Educated family on the dangers of benzo use in the elderly.  - Consider neuro consult if needed - Urine Culture - POC Urinalysis Dipstick  Dorothyann Peng, NP

## 2018-05-08 DIAGNOSIS — R410 Disorientation, unspecified: Secondary | ICD-10-CM | POA: Diagnosis not present

## 2018-05-08 LAB — POCT URINALYSIS DIPSTICK
Glucose, UA: NEGATIVE
NITRITE UA: NEGATIVE
PH UA: 5.5 (ref 5.0–8.0)
PROTEIN UA: POSITIVE — AB
SPEC GRAV UA: 1.025 (ref 1.010–1.025)
Urobilinogen, UA: 0.2 E.U./dL

## 2018-05-10 LAB — URINE CULTURE
MICRO NUMBER:: 91373164
SPECIMEN QUALITY:: ADEQUATE

## 2018-05-12 ENCOUNTER — Other Ambulatory Visit: Payer: Self-pay | Admitting: Adult Health

## 2018-05-12 MED ORDER — CIPROFLOXACIN HCL 250 MG PO TABS: 125.0000 mg | ORAL_TABLET | Freq: Every day | ORAL | 1 refills | Status: DC

## 2018-05-20 ENCOUNTER — Telehealth: Payer: Self-pay

## 2018-05-20 NOTE — Telephone Encounter (Signed)
And she has started taking her Cipro daily?   If she is becoming more confused please have her follow up tomorrow

## 2018-05-20 NOTE — Telephone Encounter (Signed)
Copied from [place] [id number]. Topic: General - Other >> May 20, 2018 12:01 PM [name] wrote:  Pt daughter Veronica Phillips call to say [name] wanted her to let him know if pt confusing is getting worse and she call today to say it is

## 2018-05-20 NOTE — Telephone Encounter (Signed)
Nothing further needed at that time.

## 2018-05-20 NOTE — Telephone Encounter (Signed)
Yes patient is taking medication daily. Patient schedule for a visit tomorrow 11/27 at 11am.

## 2018-05-20 NOTE — Telephone Encounter (Signed)
Left a message for a return call.  PEC Agent may take a message about medication and schedule an appointment if needed.

## 2018-05-21 ENCOUNTER — Other Ambulatory Visit: Payer: Self-pay | Admitting: Adult Health

## 2018-05-21 ENCOUNTER — Ambulatory Visit: Payer: Medicare Other | Admitting: Adult Health

## 2018-05-21 ENCOUNTER — Telehealth: Payer: Self-pay | Admitting: Adult Health

## 2018-05-21 ENCOUNTER — Encounter: Payer: Self-pay | Admitting: Adult Health

## 2018-05-21 VITALS — BP 90/54 | Temp 97.8°F

## 2018-05-21 DIAGNOSIS — R410 Disorientation, unspecified: Secondary | ICD-10-CM | POA: Diagnosis not present

## 2018-05-21 DIAGNOSIS — R630 Anorexia: Secondary | ICD-10-CM

## 2018-05-21 DIAGNOSIS — R5383 Other fatigue: Secondary | ICD-10-CM

## 2018-05-21 DIAGNOSIS — R634 Abnormal weight loss: Secondary | ICD-10-CM

## 2018-05-21 LAB — BASIC METABOLIC PANEL
BUN: 19 mg/dL (ref 6–23)
CALCIUM: 8.3 mg/dL — AB (ref 8.4–10.5)
CO2: 30 mEq/L (ref 19–32)
Chloride: 103 mEq/L (ref 96–112)
Creatinine, Ser: 1.41 mg/dL — ABNORMAL HIGH (ref 0.40–1.20)
GFR: 44.76 mL/min — AB (ref >60.00)
Glucose, Bld: 85 mg/dL (ref 70–99)
POTASSIUM: 2.9 meq/L — AB (ref 3.5–5.1)
SODIUM: 145 meq/L (ref 135–145)

## 2018-05-21 LAB — CBC WITH DIFFERENTIAL/PLATELET
BASOS PCT: 0.1 % (ref 0.0–3.0)
Basophils Absolute: 0 10*3/uL (ref 0.0–0.1)
EOS ABS: 0 10*3/uL (ref 0.0–0.7)
Eosinophils Relative: 0 % (ref 0.0–5.0)
HCT: 32.4 % — ABNORMAL LOW (ref 36.0–46.0)
Hemoglobin: 10.7 g/dL — ABNORMAL LOW (ref 12.0–15.0)
LYMPHS ABS: 0.7 10*3/uL (ref 0.7–4.0)
Lymphocytes Relative: 20.8 % (ref 12.0–46.0)
MCHC: 33.1 g/dL (ref 30.0–36.0)
MCV: 90.5 fl (ref 78.0–100.0)
MONO ABS: 0.4 10*3/uL (ref 0.1–1.0)
Monocytes Relative: 10.3 % (ref 3.0–12.0)
NEUTROS PCT: 68.8 % (ref 43.0–77.0)
Neutro Abs: 2.4 10*3/uL (ref 1.4–7.7)
PLATELETS: 238 10*3/uL (ref 150.0–400.0)
RBC: 3.58 Mil/uL — ABNORMAL LOW (ref 3.87–5.11)
RDW: 15.1 % (ref 11.5–15.5)
WBC: 3.4 10*3/uL — AB (ref 4.0–10.5)

## 2018-05-21 LAB — IBC PANEL
Iron: 71 ug/dL (ref 42–145)
SATURATION RATIOS: 33.1 % (ref 20.0–50.0)
TRANSFERRIN: 153 mg/dL — AB (ref 212.0–360.0)

## 2018-05-21 LAB — TSH: TSH: 3.51 u[IU]/mL (ref 0.35–4.50)

## 2018-05-21 MED ORDER — OMEPRAZOLE 20 MG PO CPDR: 20.0000 mg | DELAYED_RELEASE_CAPSULE | Freq: Every day | ORAL | 3 refills | Status: AC

## 2018-05-21 MED ORDER — POTASSIUM CHLORIDE ER 10 MEQ PO TBCR: 10.0000 meq | EXTENDED_RELEASE_TABLET | Freq: Every day | ORAL | 1 refills | Status: DC

## 2018-05-21 NOTE — Telephone Encounter (Signed)
Left VM on daughter [name]) voice mail about patients labs. Her K is low @ 2.9. Will send in potassium supplement and follow up with on Tuesday

## 2018-05-21 NOTE — Progress Notes (Signed)
Subjective:    Patient ID: Veronica Phillips, female    DOB: 1923-04-25, 84 y.o.   MRN: [id number]  HPI   84 year old female who  has a past medical history of Arthritis, Atrophic gastritis, Cancer of left breast ([place]) (1989), CKD (chronic kidney disease), stage III ([place]), Diverticulosis of colon, GERD (gastroesophageal reflux disease), Glaucoma, Headache, Hypertension, Lactose intolerance, Pernicious anemia, and Vitamin B 12 deficiency.  She presents to the office today with her daughter for increased confusion and fatigue   He was seen roughly 2 weeks ago for the acute complaint of confusion that started approximately 3 weeks prior. At this time it was experiencing possible hallucinations with confusion on things that did not happen.  One time she thought an accident had happened outside her home and that small children had come up to the house to give her more information on the accident.  She has a history of chronic urinary tract infections and was thought that this could be contributing to her confusion, she was started on 250 mg of Cipro daily.  Her daughter reports that she started this medication approximately 7 days ago  Today her daughter reports that she continues to feel as though people are inside or outside of her home, and reminds her daughter to feed the children outside.  Daughter also reports increased fatigue of appetite over the last month.  Patient endorses that he does not feel that hungry only eat small portions at mealtime.  Denies any nausea or vomiting but does have chronic diarrhea that she has had for multiple years.  Today during the exam the patient reminded me that her birthday was tomorrow, on Thanksgiving day.  A few minutes the past and I had asked the patient when she is going to do for her birthday, she looked at her daughter and stated "what day is my birthday?"    Review of Systems See HPI   Past Medical History:  Diagnosis Date  . Arthritis    "knees,  shoulders" (12/29/2015)  . Atrophic gastritis   . Cancer of left breast ([place]) 1989  . CKD (chronic kidney disease), stage III ([place])   . Diverticulosis of colon   . GERD (gastroesophageal reflux disease)   . Glaucoma   . Headache    "awful headaches when I was coming up in my teens"  . Hypertension   . Lactose intolerance   . Pernicious anemia   . Vitamin B 12 deficiency     Social History   Socioeconomic History  . Marital status: Widowed    Spouse name: Not on file  . Number of children: Not on file  . Years of education: Not on file  . Highest education level: Not on file  Occupational History  . Not on file  Social Needs  . Financial resource strain: Not on file  . Food insecurity:    Worry: Not on file    Inability: Not on file  . Transportation needs:    Medical: Not on file    Non-medical: Not on file  Tobacco Use  . Smoking status: Never Smoker  . Smokeless tobacco: Never Used  Substance and Sexual Activity  . Alcohol use: No  . Drug use: No  . Sexual activity: Not Currently  Lifestyle  . Physical activity:    Days per week: Not on file    Minutes per session: Not on file  . Stress: Not on file  Relationships  . Social connections:  Talks on phone: Not on file    Gets together: Not on file    Attends religious service: Not on file    Active member of club or organization: Not on file    Attends meetings of clubs or organizations: Not on file    Relationship status: Not on file  . Intimate partner violence:    Fear of current or ex partner: Not on file    Emotionally abused: Not on file    Physically abused: Not on file    Forced sexual activity: Not on file  Other Topics Concern  . 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 BALLOON DILATION N/A 06/28/2014   Procedure: BALLOON DILATION;  Surgeon: Ladene Artist, MD;  [facility];  Service:  Endoscopy;  Laterality: N/A;  . BOTOX INJECTION N/A 07/09/2013   Procedure: BOTOX INJECTION;  Surgeon: Ladene Artist, MD;  [facility];  Service: Endoscopy;  Laterality: N/A;  . BOTOX INJECTION N/A 06/28/2014   Procedure: BOTOX INJECTION;  Surgeon: Ladene Artist, MD;  [facility]: [facility];  Service: Endoscopy;  Laterality: N/A;  . CATARACT EXTRACTION W/ INTRAOCULAR LENS  IMPLANT, BILATERAL Bilateral 08-2015-10/2015  . ESOPHAGEAL MANOMETRY N/A 10/27/2012   Procedure: ESOPHAGEAL MANOMETRY (EM);  Surgeon: Ladene Artist, MD;  [facility];  Service: Endoscopy;  Laterality: N/A;  . ESOPHAGOGASTRODUODENOSCOPY N/A 07/09/2013   Procedure: ESOPHAGOGASTRODUODENOSCOPY (EGD);  Surgeon: Ladene Artist, MD;  Location: [facility];  Service: Endoscopy;  Laterality: N/A;  . ESOPHAGOGASTRODUODENOSCOPY N/A 10/07/2013   Procedure: ESOPHAGOGASTRODUODENOSCOPY (EGD);  Surgeon: Ladene Artist, MD;  Location: [facility];  Service: Endoscopy;  Laterality: N/A;  wtih botox  . ESOPHAGOGASTRODUODENOSCOPY (EGD) WITH PROPOFOL N/A 06/28/2014   Procedure: ESOPHAGOGASTRODUODENOSCOPY (EGD) WITH PROPOFOL;  Surgeon: Ladene Artist, MD;  [facility];  Service: Endoscopy;  Laterality: N/A;  . MASTECTOMY Left 1989    Family History  Problem Relation Age of Onset  . Hypertension Mother        family hx  . Lung cancer Father        family hx  . Stroke Unknown        family hx  . Heart disease Sister        family hx    Allergies  Allergen Reactions  . Septra [Bactrim] Swelling    Site of swelling not recalled by the patient  . Sulfa Antibiotics Swelling    Site of swelling not recalled by the patient  . Tape Other (See Comments)    Skin is sensitive; please use paper tape!!    Current Outpatient Medications on File Prior to Visit  Medication Sig Dispense Refill  . acetaminophen (TYLENOL) 500 MG tablet Take 500 mg by mouth every 6 (six) hours as needed (for pain).     . Ascorbic  Acid (VITAMIN C PO) Take 1 tablet by mouth at bedtime.     . ciprofloxacin (CIPRO) 250 MG tablet Take 0.5 tablets (125 mg total) by mouth daily. 45 tablet 1  . Cyanocobalamin (VITAMIN B-12 PO) Take 1 tablet by mouth at bedtime.     . ferrous gluconate (FERGON) 325 MG tablet Take 325 mg by mouth daily after supper.     . fluticasone (FLONASE) 50 MCG/ACT nasal spray Place 2 sprays into both nostrils daily. 16 g 6  . latanoprost (XALATAN) 0.005 %  ophthalmic solution Place 1 drop into both eyes at bedtime.   0  . Multiple Vitamins-Minerals (ONE-A-DAY WOMENS 50 PLUS PO) Take 1 tablet by mouth daily after supper.     . Nutritional Supplements (FEEDING SUPPLEMENT, BOOST BREEZE,) LIQD Take 1 Can by mouth 3 (three) times daily. 90 Can 1  . vitamin E 400 UNIT capsule Take 400 Units by mouth at bedtime.      No current facility-administered medications on file prior to visit.     BP (!) 90/54   Temp 97.8 F (36.6 C)       Objective:   Physical Exam  Constitutional: She is oriented to person, place, and time. She appears well-developed and well-nourished. No distress.  Cardiovascular: Normal rate, regular rhythm, normal heart sounds and intact distal pulses.  Pulmonary/Chest: Effort normal and breath sounds normal.  Musculoskeletal: Normal range of motion. She exhibits no edema, tenderness or deformity.  Neurological: She is alert and oriented to person, place, and time. She displays normal reflexes. No cranial nerve deficit or sensory deficit. She exhibits normal muscle tone. Coordination normal.  Skin: Skin is warm and dry. Capillary refill takes less than 2 seconds. She is not diaphoretic.  Psychiatric: She has a normal mood and affect. Her behavior is normal. Judgment and thought content normal.  Nursing note and vitals reviewed.     Assessment & Plan:  1. Fatigue, unspecified type  - CBC with Differential/Platelet - Basic Metabolic Panel - Iron, TIBC and Ferritin Panel - TSH -  omeprazole (PRILOSEC) 20 MG capsule; Take 1 capsule (20 mg total) by mouth daily.  Dispense: 90 capsule; Refill: 3 - IBC panel  2. Confusion - unknown cause but could still be from chronic UTI  - CT Head Wo Contrast; Future  3. Loss of appetite - Consider remeron  - CT Abdomen Pelvis Wo Contrast; Future  4. Abnormal weight loss - CT Abdomen Pelvis Wo Contrast; Future  Dorothyann Peng, NP

## 2018-06-09 ENCOUNTER — Ambulatory Visit: Payer: Self-pay | Admitting: *Deleted

## 2018-06-09 NOTE — Telephone Encounter (Signed)
TC to patient's daughter. Discussed Cory's note. She will monitor and log b/p if occurs again.

## 2018-06-09 NOTE — Telephone Encounter (Signed)
Patient's daughter phones-the patient got weak in the legs and went down while holding the sink last night. No injury reported. This morning, the patient got weak in the knees after getting up from the toilet but the daughter caught her from behind. Unsure if any unconsciousness occurred but it took a few seconds for her to verbally respond to the daughter. No injury sustained. Daughter reports patient has been dx with orthostatic b/p. No vitals to reports today. Patient now comfortable sitting on sofa and responding appropriately to me. She denies dizziness/vertigo/sweating/CP/SOB/Pain just before the weakness and afterwards. She had breakfast and fluids this morning. Denies fever. CT of head/abd/pelvis scheduled for 12/27. Routing to PCP for any advice.  Reason for Disposition . Simple fainting is a chronic symptom (has occurred multiple times)  Answer Assessment - Initial Assessment Questions 1. ONSET: "How long were you unconscious?" (minutes) "When did it happen?"     Daughter calls. Stated she thinks patient didn't respond to her for just seconds. She could not see her face at the time.  2. CONTENT: "What happened during period of unconsciousness?" (e.g., seizure activity)      Nothing. 3. MENTAL STATUS: "Alert and oriented now?" (oriented x 3 = name, month, location)      Yes 4. TRIGGER: "What do you think caused the fainting?" "What were you doing just before you fainted?"  (e.g., exercise, sudden standing up, prolonged standing)     Last night, she became weak in the knees after using the bath and standing at the sink.  5. RECURRENT SYMPTOM: "Have you ever passed out before?" If so, ask: "When was the last time?" and "What happened that time?"      Yes 6. INJURY: "Did you sustain any injury during the fall?"      no 7. CARDIAC SYMPTOMS: "Have you had any of the following symptoms: chest pain, difficulty breathing, palpitations?"     no 8. NEUROLOGIC SYMPTOMS: "Have you had any of the  following symptoms: headache, numbness, vertigo, weakness?"     Weakness in the legs 9. GI SYMPTOMS: "Have you had any of the following symptoms: abdominal pain, vomiting, diarrhea, blood in stools?"     Sometimes vomits after feeling weak in the knees 10. OTHER SYMPTOMS: "Do you have any other symptoms?"       no 11. PREGNANCY: "Is there any chance you are pregnant?" "When was your last menstrual period?"       na  Protocols used: [facility]

## 2018-06-09 NOTE — Telephone Encounter (Signed)
Please advise 

## 2018-06-09 NOTE — Telephone Encounter (Signed)
Continue to monitor. If able, please monitor BP at home if this happens again

## 2018-06-15 ENCOUNTER — Emergency Department (HOSPITAL_COMMUNITY): Payer: Medicare Other

## 2018-06-15 ENCOUNTER — Encounter (HOSPITAL_COMMUNITY): Payer: Self-pay | Admitting: Emergency Medicine

## 2018-06-15 ENCOUNTER — Inpatient Hospital Stay (HOSPITAL_COMMUNITY)
Admission: EM | Admit: 2018-06-15 | Discharge: 2018-06-23 | DRG: 480 | Disposition: A | Discharge status: Skilled Nursing Facility | Payer: Medicare Other | Source: Ambulatory Visit | Attending: Family Medicine | Admitting: Family Medicine

## 2018-06-15 DIAGNOSIS — D638 Anemia in other chronic diseases classified elsewhere: Secondary | ICD-10-CM | POA: Diagnosis not present

## 2018-06-15 DIAGNOSIS — S8991XA Unspecified injury of right lower leg, initial encounter: Secondary | ICD-10-CM | POA: Diagnosis not present

## 2018-06-15 DIAGNOSIS — D510 Vitamin B12 deficiency anemia due to intrinsic factor deficiency: Secondary | ICD-10-CM | POA: Diagnosis present

## 2018-06-15 DIAGNOSIS — Z792 Long term (current) use of antibiotics: Secondary | ICD-10-CM

## 2018-06-15 DIAGNOSIS — M2550 Pain in unspecified joint: Secondary | ICD-10-CM | POA: Diagnosis not present

## 2018-06-15 DIAGNOSIS — R296 Repeated falls: Secondary | ICD-10-CM | POA: Diagnosis not present

## 2018-06-15 DIAGNOSIS — D631 Anemia in chronic kidney disease: Secondary | ICD-10-CM | POA: Diagnosis not present

## 2018-06-15 DIAGNOSIS — N179 Acute kidney failure, unspecified: Secondary | ICD-10-CM | POA: Diagnosis present

## 2018-06-15 DIAGNOSIS — Y92008 Other place in unspecified non-institutional (private) residence as the place of occurrence of the external cause: Secondary | ICD-10-CM | POA: Diagnosis not present

## 2018-06-15 DIAGNOSIS — E876 Hypokalemia: Secondary | ICD-10-CM | POA: Diagnosis present

## 2018-06-15 DIAGNOSIS — E739 Lactose intolerance, unspecified: Secondary | ICD-10-CM | POA: Diagnosis present

## 2018-06-15 DIAGNOSIS — Y92009 Unspecified place in unspecified non-institutional (private) residence as the place of occurrence of the external cause: Secondary | ICD-10-CM

## 2018-06-15 DIAGNOSIS — N390 Urinary tract infection, site not specified: Secondary | ICD-10-CM | POA: Diagnosis not present

## 2018-06-15 DIAGNOSIS — S72492A Other fracture of lower end of left femur, initial encounter for closed fracture: Secondary | ICD-10-CM | POA: Diagnosis not present

## 2018-06-15 DIAGNOSIS — N183 Chronic kidney disease, stage 3 (moderate): Secondary | ICD-10-CM

## 2018-06-15 DIAGNOSIS — F0390 Unspecified dementia without behavioral disturbance: Secondary | ICD-10-CM | POA: Diagnosis present

## 2018-06-15 DIAGNOSIS — K5900 Constipation, unspecified: Secondary | ICD-10-CM | POA: Diagnosis not present

## 2018-06-15 DIAGNOSIS — K219 Gastro-esophageal reflux disease without esophagitis: Secondary | ICD-10-CM | POA: Diagnosis not present

## 2018-06-15 DIAGNOSIS — M25561 Pain in right knee: Secondary | ICD-10-CM | POA: Diagnosis present

## 2018-06-15 DIAGNOSIS — W010XXA Fall on same level from slipping, tripping and stumbling without subsequent striking against object, initial encounter: Secondary | ICD-10-CM | POA: Diagnosis present

## 2018-06-15 DIAGNOSIS — W19XXXA Unspecified fall, initial encounter: Secondary | ICD-10-CM

## 2018-06-15 DIAGNOSIS — S79929A Unspecified injury of unspecified thigh, initial encounter: Secondary | ICD-10-CM | POA: Diagnosis not present

## 2018-06-15 DIAGNOSIS — W2209XA Striking against other stationary object, initial encounter: Secondary | ICD-10-CM | POA: Diagnosis present

## 2018-06-15 DIAGNOSIS — R269 Unspecified abnormalities of gait and mobility: Secondary | ICD-10-CM | POA: Diagnosis present

## 2018-06-15 DIAGNOSIS — G9341 Metabolic encephalopathy: Secondary | ICD-10-CM | POA: Diagnosis present

## 2018-06-15 DIAGNOSIS — S72452A Displaced supracondylar fracture without intracondylar extension of lower end of left femur, initial encounter for closed fracture: Secondary | ICD-10-CM | POA: Diagnosis not present

## 2018-06-15 DIAGNOSIS — R402144 Coma scale, eyes open, spontaneous, 24 hours or more after hospital admission: Secondary | ICD-10-CM | POA: Diagnosis not present

## 2018-06-15 DIAGNOSIS — S72402A Unspecified fracture of lower end of left femur, initial encounter for closed fracture: Secondary | ICD-10-CM | POA: Diagnosis not present

## 2018-06-15 DIAGNOSIS — L8990 Pressure ulcer of unspecified site, unspecified stage: Secondary | ICD-10-CM

## 2018-06-15 DIAGNOSIS — Z87440 Personal history of urinary (tract) infections: Secondary | ICD-10-CM | POA: Diagnosis not present

## 2018-06-15 DIAGNOSIS — Z9012 Acquired absence of left breast and nipple: Secondary | ICD-10-CM

## 2018-06-15 DIAGNOSIS — S7292XD Unspecified fracture of left femur, subsequent encounter for closed fracture with routine healing: Secondary | ICD-10-CM | POA: Diagnosis not present

## 2018-06-15 DIAGNOSIS — M25551 Pain in right hip: Secondary | ICD-10-CM | POA: Diagnosis not present

## 2018-06-15 DIAGNOSIS — Z9889 Other specified postprocedural states: Secondary | ICD-10-CM

## 2018-06-15 DIAGNOSIS — Z4789 Encounter for other orthopedic aftercare: Secondary | ICD-10-CM | POA: Diagnosis not present

## 2018-06-15 DIAGNOSIS — K220 Achalasia of cardia: Secondary | ICD-10-CM | POA: Diagnosis present

## 2018-06-15 DIAGNOSIS — R402244 Coma scale, best verbal response, confused conversation, 24 hours or more after hospital admission: Secondary | ICD-10-CM | POA: Diagnosis not present

## 2018-06-15 DIAGNOSIS — I951 Orthostatic hypotension: Secondary | ICD-10-CM | POA: Diagnosis not present

## 2018-06-15 DIAGNOSIS — M25552 Pain in left hip: Secondary | ICD-10-CM | POA: Diagnosis not present

## 2018-06-15 DIAGNOSIS — R402364 Coma scale, best motor response, obeys commands, 24 hours or more after hospital admission: Secondary | ICD-10-CM | POA: Diagnosis not present

## 2018-06-15 DIAGNOSIS — Z01818 Encounter for other preprocedural examination: Secondary | ICD-10-CM | POA: Diagnosis not present

## 2018-06-15 DIAGNOSIS — Z91048 Other nonmedicinal substance allergy status: Secondary | ICD-10-CM

## 2018-06-15 DIAGNOSIS — B961 Klebsiella pneumoniae [K. pneumoniae] as the cause of diseases classified elsewhere: Secondary | ICD-10-CM | POA: Diagnosis present

## 2018-06-15 DIAGNOSIS — Z419 Encounter for procedure for purposes other than remedying health state, unspecified: Secondary | ICD-10-CM

## 2018-06-15 DIAGNOSIS — M25461 Effusion, right knee: Secondary | ICD-10-CM | POA: Diagnosis not present

## 2018-06-15 DIAGNOSIS — H409 Unspecified glaucoma: Secondary | ICD-10-CM | POA: Diagnosis present

## 2018-06-15 DIAGNOSIS — I129 Hypertensive chronic kidney disease with stage 1 through stage 4 chronic kidney disease, or unspecified chronic kidney disease: Secondary | ICD-10-CM | POA: Diagnosis not present

## 2018-06-15 DIAGNOSIS — Z7401 Bed confinement status: Secondary | ICD-10-CM | POA: Diagnosis not present

## 2018-06-15 DIAGNOSIS — R531 Weakness: Secondary | ICD-10-CM | POA: Diagnosis not present

## 2018-06-15 DIAGNOSIS — Z853 Personal history of malignant neoplasm of breast: Secondary | ICD-10-CM

## 2018-06-15 DIAGNOSIS — Z882 Allergy status to sulfonamides status: Secondary | ICD-10-CM

## 2018-06-15 DIAGNOSIS — S72452D Displaced supracondylar fracture without intracondylar extension of lower end of left femur, subsequent encounter for closed fracture with routine healing: Secondary | ICD-10-CM | POA: Diagnosis not present

## 2018-06-15 DIAGNOSIS — Z79899 Other long term (current) drug therapy: Secondary | ICD-10-CM

## 2018-06-15 DIAGNOSIS — D62 Acute posthemorrhagic anemia: Secondary | ICD-10-CM | POA: Diagnosis present

## 2018-06-15 DIAGNOSIS — L89159 Pressure ulcer of sacral region, unspecified stage: Secondary | ICD-10-CM | POA: Diagnosis present

## 2018-06-15 DIAGNOSIS — N189 Chronic kidney disease, unspecified: Secondary | ICD-10-CM | POA: Diagnosis not present

## 2018-06-15 DIAGNOSIS — Z9181 History of falling: Secondary | ICD-10-CM | POA: Diagnosis not present

## 2018-06-15 DIAGNOSIS — I10 Essential (primary) hypertension: Secondary | ICD-10-CM | POA: Diagnosis not present

## 2018-06-15 LAB — CBC WITH DIFFERENTIAL/PLATELET
ABS IMMATURE GRANULOCYTES: 0.02 10*3/uL (ref 0.00–0.07)
Basophils Absolute: 0 10*3/uL (ref 0.0–0.1)
Basophils Relative: 0 %
Eosinophils Absolute: 0 10*3/uL (ref 0.0–0.5)
Eosinophils Relative: 0 %
HCT: 32.9 % — ABNORMAL LOW (ref 36.0–46.0)
Hemoglobin: 10.3 g/dL — ABNORMAL LOW (ref 12.0–15.0)
Immature Granulocytes: 0 %
Lymphocytes Relative: 11 %
Lymphs Abs: 0.9 10*3/uL (ref 0.7–4.0)
MCH: 29.8 pg (ref 26.0–34.0)
MCHC: 31.3 g/dL (ref 30.0–36.0)
MCV: 95.1 fL (ref 80.0–100.0)
Monocytes Absolute: 0.5 10*3/uL (ref 0.1–1.0)
Monocytes Relative: 7 %
Neutro Abs: 6.3 10*3/uL (ref 1.7–7.7)
Neutrophils Relative %: 82 %
Platelets: 236 10*3/uL (ref 150–400)
RBC: 3.46 MIL/uL — ABNORMAL LOW (ref 3.87–5.11)
RDW: 14.8 % (ref 11.5–15.5)
WBC: 7.7 10*3/uL (ref 4.0–10.5)
nRBC: 0 % (ref 0.0–0.2)

## 2018-06-15 LAB — COMPREHENSIVE METABOLIC PANEL
ALT: 16 U/L (ref 0–44)
AST: 22 U/L (ref 15–41)
Albumin: 3.5 g/dL (ref 3.5–5.0)
Alkaline Phosphatase: 50 U/L (ref 38–126)
Anion gap: 11 (ref 5–15)
BUN: 19 mg/dL (ref 8–23)
CO2: 26 mmol/L (ref 22–32)
Calcium: 8.4 mg/dL — ABNORMAL LOW (ref 8.9–10.3)
Chloride: 106 mmol/L (ref 98–111)
Creatinine, Ser: 1.14 mg/dL — ABNORMAL HIGH (ref 0.44–1.00)
GFR calc Af Amer: 48 mL/min — ABNORMAL LOW (ref >60)
GFR calc non Af Amer: 41 mL/min — ABNORMAL LOW (ref >60)
GLUCOSE: 123 mg/dL — AB (ref 70–99)
Potassium: 3.8 mmol/L (ref 3.5–5.1)
Sodium: 143 mmol/L (ref 135–145)
Total Bilirubin: 0.7 mg/dL (ref 0.3–1.2)
Total Protein: 6.2 g/dL — ABNORMAL LOW (ref 6.5–8.1)

## 2018-06-15 MED ORDER — PANTOPRAZOLE SODIUM 40 MG PO TBEC
40.0000 mg | DELAYED_RELEASE_TABLET | Freq: Every day | ORAL | Status: DC
  Administered 2018-06-17 – 2018-06-23 (×7): 40 mg via ORAL
  Filled 2018-06-15 (×7): qty 1

## 2018-06-15 MED ORDER — ONDANSETRON HCL 4 MG/2ML IJ SOLN
4.0000 mg | Freq: Once | INTRAMUSCULAR | Status: AC
  Administered 2018-06-15: 4 mg via INTRAVENOUS
  Filled 2018-06-15: qty 2

## 2018-06-15 MED ORDER — MORPHINE SULFATE (PF) 2 MG/ML IV SOLN
0.5000 mg | INTRAVENOUS | Status: DC | PRN
  Administered 2018-06-17 – 2018-06-21 (×2): 0.5 mg via INTRAVENOUS
  Filled 2018-06-15 (×2): qty 1

## 2018-06-15 MED ORDER — FENTANYL CITRATE (PF) 100 MCG/2ML IJ SOLN
25.0000 ug | INTRAMUSCULAR | Status: DC | PRN
  Administered 2018-06-15: 25 ug via INTRAVENOUS
  Filled 2018-06-15: qty 2

## 2018-06-15 MED ORDER — ENOXAPARIN SODIUM 40 MG/0.4ML ~~LOC~~ SOLN: 40.0000 mg | Freq: Every day | SUBCUTANEOUS | Status: DC

## 2018-06-15 MED ORDER — HYDROCODONE-ACETAMINOPHEN 5-325 MG PO TABS
1.0000 | ORAL_TABLET | Freq: Four times a day (QID) | ORAL | Status: DC | PRN
  Administered 2018-06-15 – 2018-06-16 (×2): 2 via ORAL
  Administered 2018-06-21: 1 via ORAL
  Filled 2018-06-15: qty 2
  Filled 2018-06-15 (×2): qty 1
  Filled 2018-06-15: qty 2
  Filled 2018-06-15 (×3): qty 1

## 2018-06-15 MED ORDER — ENSURE ENLIVE PO LIQD: 237.0000 mL | Freq: Two times a day (BID) | ORAL | Status: DC

## 2018-06-15 MED ORDER — POTASSIUM CHLORIDE CRYS ER 10 MEQ PO TBCR
10.0000 meq | EXTENDED_RELEASE_TABLET | Freq: Every day | ORAL | Status: DC
  Administered 2018-06-17 – 2018-06-23 (×7): 10 meq via ORAL
  Filled 2018-06-15 (×8): qty 1

## 2018-06-15 MED ORDER — LATANOPROST 0.005 % OP SOLN
1.0000 [drp] | Freq: Every day | OPHTHALMIC | Status: DC
  Administered 2018-06-15 – 2018-06-22 (×8): 1 [drp] via OPHTHALMIC
  Filled 2018-06-15: qty 2.5

## 2018-06-15 MED ORDER — MORPHINE SULFATE (PF) 4 MG/ML IV SOLN
4.0000 mg | Freq: Once | INTRAVENOUS | Status: AC
  Administered 2018-06-15: 4 mg via INTRAVENOUS
  Filled 2018-06-15: qty 1

## 2018-06-15 MED ORDER — ALBUTEROL SULFATE (2.5 MG/3ML) 0.083% IN NEBU: 2.5000 mg | INHALATION_SOLUTION | Freq: Four times a day (QID) | RESPIRATORY_TRACT | Status: DC | PRN

## 2018-06-15 MED ORDER — SODIUM CHLORIDE 0.9 % IV SOLN
INTRAVENOUS | Status: DC
  Administered 2018-06-15: 23:00:00 via INTRAVENOUS

## 2018-06-15 NOTE — ED Notes (Signed)
Attempted to call report but RN at [facility] was in the middle of a medication pass.  Will call RN back at 20:50.

## 2018-06-15 NOTE — ED Provider Notes (Signed)
[facility] DEPT Provider Note   CSN: [id number] Arrival date & time: 06/15/18  1623     History   Chief Complaint Chief Complaint  Patient presents with  . Fall  . Leg Injury    HPI Veronica Phillips is a 84 y.o. female.  Pt presents to the ED today with a left knee injury.  The pt lives at home with her daughter.  The daughter was at church.  The pt thought she heard someone at the door and when to the door.  She tripped and landed on the porch.  She does not think she passed out.  She denies hitting her head.  The pt's daughter took her to urgent care who did an xray which showed a distal femur fx.  She was sent her by POV.  Pt also c/o right knee pain, but she is able to put weight on that knee.     Past Medical History:  Diagnosis Date  . Arthritis    "knees, shoulders" (12/29/2015)  . Atrophic gastritis   . Cancer of left breast ([place]) 1989  . CKD (chronic kidney disease), stage III ([place])   . Diverticulosis of colon   . GERD (gastroesophageal reflux disease)   . Glaucoma   . Headache    "awful headaches when I was coming up in my teens"  . Hypertension   . Lactose intolerance   . Pernicious anemia   . Vitamin B 12 deficiency     Patient Active Problem List   Diagnosis Date Noted  . Orthostatic hypotension 03/11/2018  . Acute encephalopathy 03/11/2018  . Bilateral primary osteoarthritis of knee 02/27/2018  . Weight loss 08/30/2017  . S/p left hip fracture 07/12/2017  . Subcoracoid bursitis, right 07/12/2017  . CKD (chronic kidney disease), stage III ([place]) 12/29/2015  . Dizziness 12/29/2015  . Lower urinary tract infectious disease 12/29/2015  . Orthostatic syncope 12/29/2015  . Fall   . Chronic anemia   . Need for prophylactic vaccination against Streptococcus pneumoniae (pneumococcus) 01/06/2015  . Precordial chest pain 11/05/2014  . Dysphagia, pharyngoesophageal phase   . 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 Achalasia  07/07/2013  . Peripheral edema 05/19/2012  . Acute cystitis without hematuria 02/08/2011  . Glaucoma 07/17/2010  . GERD 07/17/2010  . OTHER DYSPHAGIA 07/17/2010  . PROBLEMS WITH SWALLOWING AND MASTICATION 07/11/2010  . IDIOPATHIC URTICARIA 04/11/2010  . ANEMIA, B12 DEFICIENCY 05/18/2008  . OSTEOARTHRITIS 05/12/2007  . DIVERTICULOSIS, COLON 02/26/2007    Past Surgical History:  Procedure Laterality Date  . BALLOON DILATION N/A 06/28/2014   Procedure: BALLOON DILATION;  Surgeon: Ladene Artist, MD;  Location: [facility];  Service: Endoscopy;  Laterality: N/A;  . BOTOX INJECTION N/A 07/09/2013   Procedure: BOTOX INJECTION;  Surgeon: Ladene Artist, MD;  Location: [facility];  Service: Endoscopy;  Laterality: N/A;  . BOTOX INJECTION N/A 06/28/2014   Procedure: BOTOX INJECTION;  Surgeon: Ladene Artist, MD;  Location: [facility];  Service: Endoscopy;  Laterality: N/A;  . CATARACT EXTRACTION W/ INTRAOCULAR LENS  IMPLANT, BILATERAL Bilateral 08-2015-10/2015  . ESOPHAGEAL MANOMETRY N/A 10/27/2012   Procedure: ESOPHAGEAL MANOMETRY (EM);  Surgeon: Ladene Artist, MD;  Location: [facility];  Service: Endoscopy;  Laterality: N/A;  . ESOPHAGOGASTRODUODENOSCOPY N/A 07/09/2013   Procedure: ESOPHAGOGASTRODUODENOSCOPY (EGD);  Surgeon: Ladene Artist, MD;  Location: [facility];  Service: Endoscopy;  Laterality: N/A;  . ESOPHAGOGASTRODUODENOSCOPY N/A 10/07/2013   Procedure: ESOPHAGOGASTRODUODENOSCOPY (EGD);  Surgeon: Ladene Artist, MD;  Location: [facility];  Service: Endoscopy;  Laterality: N/A;  wtih botox  . ESOPHAGOGASTRODUODENOSCOPY (EGD) WITH PROPOFOL N/A 06/28/2014   Procedure: ESOPHAGOGASTRODUODENOSCOPY (EGD) WITH PROPOFOL;  Surgeon: Ladene Artist, MD;  Location: [facility];  Service: Endoscopy;  Laterality: N/A;  . MASTECTOMY Left 1989     OB History   No obstetric history on file.      Home Medications    Prior to Admission medications   Medication Sig Start Date End Date  Taking? Authorizing Provider  Ascorbic Acid (VITAMIN C PO) Take 1 tablet by mouth at bedtime.    Yes [provider]  BIOTIN PO Take 1 tablet by mouth every evening.   Yes [provider]  ciprofloxacin (CIPRO) 250 MG tablet Take 0.5 tablets (125 mg total) by mouth daily. 05/12/18 08/10/18 Yes Nafziger, [name], NP  Cyanocobalamin (VITAMIN B-12 PO) Take 1 tablet by mouth at bedtime.    Yes [provider]  ENSURE PLUS (ENSURE PLUS) LIQD Take 237 mLs by mouth daily.   Yes [provider]  ferrous gluconate (FERGON) 325 MG tablet Take 325 mg by mouth daily after supper.    Yes [provider]  latanoprost (XALATAN) 0.005 % ophthalmic solution Place 1 drop into both eyes at bedtime.  12/03/14  Yes [provider]  Multiple Vitamins-Minerals (ONE-A-DAY WOMENS 50 PLUS PO) Take 1 tablet by mouth daily after supper.    Yes [provider]  multivitamin-lutein (OCUVITE-LUTEIN) CAPS capsule Take 1 capsule by mouth daily.   Yes [provider]  omeprazole (PRILOSEC) 20 MG capsule Take 1 capsule (20 mg total) by mouth daily. 05/21/18  Yes Nafziger, [name], NP  potassium chloride (K-DUR) 10 MEQ tablet Take 1 tablet (10 mEq total) by mouth daily. 05/21/18  Yes Nafziger, [name], NP  vitamin E 400 UNIT capsule Take 400 Units by mouth at bedtime.    Yes [provider]  acetaminophen (TYLENOL) 500 MG tablet Take 500 mg by mouth every 6 (six) hours as needed (for pain).     [provider]  fluticasone (FLONASE) 50 MCG/ACT nasal spray Place 2 sprays into both nostrils daily. Patient not taking: Reported on 03/26/202019 11/23/16   Dorothyann Peng, NP  Nutritional Supplements (FEEDING SUPPLEMENT, BOOST BREEZE,) LIQD Take 1 Can by mouth 3 (three) times daily. Patient not taking: Reported on 03/26/202019 03/13/18   Bonnell Public, MD    Family History Family History  Problem Relation Age of Onset  . Hypertension Mother        family hx    . Lung cancer Father        family hx  . Stroke Other        family hx  . Heart disease Sister        family hx    Social History Social History   Tobacco Use  . Smoking status: Never Smoker  . Smokeless tobacco: Never Used  Substance Use Topics  . Alcohol use: No  . Drug use: No     Allergies   Septra [bactrim]; Sulfa antibiotics; and Tape   Review of Systems Review of Systems  Musculoskeletal:       Bilateral knee pain (L more than R)  All other systems reviewed and are negative.    Physical Exam Updated Vital Signs BP (!) 142/78   Pulse 84   Temp 97.6 F (36.4 C) (Oral)   Resp (!) 23   SpO2 98%   Physical Exam Vitals signs and nursing  note reviewed.  Constitutional:      Appearance: Normal appearance. She is normal weight.  HENT:     Head: Normocephalic and atraumatic.     Right Ear: External ear normal.     Left Ear: External ear normal.     Nose: Nose normal.     Mouth/Throat:     Mouth: Mucous membranes are moist.     Pharynx: Oropharynx is clear.  Eyes:     Extraocular Movements: Extraocular movements intact.     Conjunctiva/sclera: Conjunctivae normal.     Pupils: Pupils are equal, round, and reactive to light.  Neck:     Musculoskeletal: Normal range of motion and neck supple.  Cardiovascular:     Rate and Rhythm: Normal rate and regular rhythm.     Pulses: Normal pulses.     Heart sounds: Normal heart sounds.  Pulmonary:     Effort: Pulmonary effort is normal.     Breath sounds: Normal breath sounds.  Abdominal:     General: Abdomen is flat. Bowel sounds are normal.     Palpations: Abdomen is soft.  Musculoskeletal:     Right knee: Tenderness found.     Left knee: She exhibits decreased range of motion, swelling and deformity.  Skin:    General: Skin is warm.     Capillary Refill: Capillary refill takes less than 2 seconds.  Neurological:     General: No focal deficit present.     Mental Status: She is alert and oriented to  person, place, and time.  Psychiatric:        Mood and Affect: Mood normal.        Behavior: Behavior normal.      ED Treatments / Results  Labs (all labs ordered are listed, but only abnormal results are displayed) Labs Reviewed  COMPREHENSIVE METABOLIC PANEL - Abnormal; Notable for the following components:      Result Value   Glucose, Bld 123 (*)    Creatinine, Ser 1.14 (*)    Calcium 8.4 (*)    Total Protein 6.2 (*)    GFR calc non Af Amer 41 (*)    GFR calc Af Amer 48 (*)    All other components within normal limits  CBC WITH DIFFERENTIAL/PLATELET - Abnormal; Notable for the following components:   RBC 3.46 (*)    Hemoglobin 10.3 (*)    HCT 32.9 (*)    All other components within normal limits  URINALYSIS, ROUTINE W REFLEX MICROSCOPIC    EKG EKG Interpretation  Date/Time:  Sunday June 15 2018 17:07:14 EST Ventricular Rate:  83 PR Interval:    QRS Duration: 88 QT Interval:  391 QTC Calculation: 460 R Axis:   15 Text Interpretation:  Sinus rhythm Anterior infarct, old Confirmed by Isla Pence [phone]) on 2020-08-1217 5:12:19 PM   Radiology Dg Chest 2 View  Result Date: 2020-08-1217 CLINICAL DATA:  Left femur fracture.  Pre-op respiratory exam EXAM: CHEST - 2 VIEW COMPARISON:  01/25/2017 and CT on 09/28/2012 FINDINGS: Stable mild cardiomegaly. Aortic atherosclerosis. Right paratracheal mass is again seen, consistent with dilated thoracic esophagus as demonstrated on previous CT. No evidence of pulmonary infiltrate or edema. No significant pleural effusion. Surgical clips again seen in left axilla. IMPRESSION: No active lung disease. Stable right paratracheal mass, consistent with dilated thoracic esophagus. Stable mild cardiomegaly. Electronically Signed   By: Earle Gell M.D.   On: 2020-08-1217 18:28   Dg Knee 1-2 Views Left  Result Date: 2020-08-1217 CLINICAL  DATA:  Left femur fracture. EXAM: LEFT KNEE - 1-2 VIEW COMPARISON:  02/27/2018 FINDINGS: Bones are  diffusely demineralized. Comminuted fracture of the distal femoral metaphysis evident although assessment limited by patient positioning. No definite evidence of fracture extension to the weightbearing articular surface. There is advanced tricompartmental degenerative change in the knee. IMPRESSION: Comminuted fracture of the distal femoral metaphysis without definite extension to the weight-bearing articular surface of the femoral condyles. Osteopenia. Electronically Signed   By: Misty Stanley M.D.   On: 2020/02/318 18:25   Dg Knee Complete 4 Views Right  Result Date: 2020/02/318 CLINICAL DATA:  Fall EXAM: RIGHT KNEE - COMPLETE 4+ VIEW COMPARISON:  01/25/2017 FINDINGS: No fracture or malalignment. Severe tricompartment arthritis with joint space narrowing, subarticular sclerosis and osteophyte formation. Small moderate knee effusion. IMPRESSION: 1. No acute osseous abnormality. 2. Advanced arthritis of the knee with small moderate knee effusion Electronically Signed   By: Donavan Foil M.D.   On: 2020/02/318 18:26   Dg Femur Min 2 Views Left  Result Date: 2020/02/318 CLINICAL DATA:  Fall, history of fracture EXAM: LEFT FEMUR 2 VIEWS COMPARISON:  02/27/2018, 2020/02/318 FINDINGS: Left femoral head projects in joint. Moderate inferior degenerative changes at the glenohumeral interval. Acute mildly comminuted distal femoral fracture involving the metaphysis. 1/2 bone with posterior displacement and lateral displacement of distal fracture fragment. No knee dislocation. Advanced arthritis at the knee. Vascular calcifications. IMPRESSION: Acute mildly comminuted and displaced distal femoral fracture. Electronically Signed   By: Donavan Foil M.D.   On: 2020/02/318 18:32    Procedures Procedures (including critical care time)  Medications Ordered in ED Medications  fentaNYL (SUBLIMAZE) injection 25 mcg (25 mcg Intravenous Given 06/15/18 1734)  ondansetron (ZOFRAN) injection 4 mg (4 mg Intravenous Given  06/15/18 1733)  morphine 4 MG/ML injection 4 mg (4 mg Intravenous Given 06/15/18 1849)     Initial Impression / Assessment and Plan / ED Course  I have reviewed the triage vital signs and the nursing notes.  Pertinent labs & imaging results that were available during my care of the patient were reviewed by me and considered in my medical decision making (see chart for details).    Pt sees Dr. Durward Fortes who last injected her knees with cortisone a few months ago.  Pt d/w Dr. Ninfa Linden [facility] ortho) who recommended medicine admission at [facility].  Pt will need a knee immobilizer.  He likely can't operate until 12/24, so pt can eat and drink for now.  Pt d/w Dr. Harvest Forest (triad) for admission.  Final Clinical Impressions(s) / ED Diagnoses   Final diagnoses:  Other closed fracture of distal end of left femur, initial encounter [facility])  Fall, initial encounter    ED Discharge Orders    None       Isla Pence, MD 06/15/18 [phone]

## 2018-06-15 NOTE — ED Notes (Signed)
ED TO INPATIENT HANDOFF REPORT  Name/Age/Gender Veronica Phillips 84 y.o. female  Code Status    Code Status Orders  (From admission, onward)         Start     Ordered   06/15/18 1944  Full code  Continuous     06/15/18 1958        Code Status History    Date Active Date Inactive Code Status Order ID Comments User Context   03/11/2018 0259 03/13/2018 2005 Full Code [id number]  Rise Patience, MD ED   12/29/2015 1957 12/31/2015 1820 Full Code [id number]  Ivor Costa, MD ED   11/05/2014 1721 11/06/2014 1948 Full Code [id number]  Orson Eva, MD Inpatient   09/28/2012 0809 09/29/2012 1405 Full Code [id number]  Velvet Bathe, MD ED      Home/SNF/Other Rehab  Chief Complaint Broken Femur   Level of Care/Admitting Diagnosis ED Disposition    ED Disposition Condition [facility] Area: [place] [[id number]]  Level of Care: Medical Telemetry [104]  Diagnosis: Closed fracture of left distal femur [facility]) [[id number]]  Admitting Physician: Norval Morton [[id number]]  Attending Physician: Norval Morton [[id number]]  Estimated length of stay: past midnight tomorrow  Certification:: I certify this patient will need inpatient services for at least 2 midnights  PT Class (Do Not Modify): Inpatient [101]  PT Acc Code (Do Not Modify): Private [1]       Medical History Past Medical History:  Diagnosis Date  . Arthritis    "knees, shoulders" (12/29/2015)  . Atrophic gastritis   . Cancer of left breast ([place]) 1989  . CKD (chronic kidney disease), stage III ([place])   . Diverticulosis of colon   . GERD (gastroesophageal reflux disease)   . Glaucoma   . Headache    "awful headaches when I was coming up in my teens"  . Hypertension   . Lactose intolerance   . Pernicious anemia   . Vitamin B 12 deficiency     Allergies Allergies  Allergen Reactions  . Septra [Bactrim] Swelling    Site of swelling not recalled by the patient  . Sulfa Antibiotics Swelling    Site of  swelling not recalled by the patient  . Tape Other (See Comments)    Skin is sensitive; please use paper tape!!    IV Location/Drains/Wounds Patient Lines/Drains/Airways Status   Active Line/Drains/Airways    Name:   Placement date:   Placement time:   Site:   Days:   Peripheral IV 06/15/18 Right Forearm   06/15/18    1725    Forearm   less than 1          Labs/Imaging Results for orders placed or performed during the hospital encounter of 06/15/18 (from the past 48 hour(s))  Comprehensive metabolic panel     Status: Abnormal   Collection Time: 06/15/18  5:28 PM  Result Value Ref Range   Sodium 143 135 - 145 mmol/L   Potassium 3.8 3.5 - 5.1 mmol/L   Chloride 106 98 - 111 mmol/L   CO2 26 22 - 32 mmol/L   Glucose, Bld 123 (H) 70 - 99 mg/dL   BUN 19 8 - 23 mg/dL   Creatinine, Ser 1.14 (H) 0.44 - 1.00 mg/dL   Calcium 8.4 (L) 8.9 - 10.3 mg/dL   Total Protein 6.2 (L) 6.5 - 8.1 g/dL   Albumin 3.5 3.5 - 5.0 g/dL   AST 22 15 - 41 U/L  ALT 16 0 - 44 U/L   Alkaline Phosphatase 50 38 - 126 U/L   Total Bilirubin 0.7 0.3 - 1.2 mg/dL   GFR calc non Af Amer 41 (L) >60 mL/min   GFR calc Af Amer 48 (L) >60 mL/min   Anion gap 11 5 - 15    Comment: Performed at [facility], [place] [address] [place], [place] [address]  CBC with Differential     Status: Abnormal   Collection Time: 06/15/18  5:28 PM  Result Value Ref Range   WBC 7.7 4.0 - 10.5 K/uL   RBC 3.46 (L) 3.87 - 5.11 MIL/uL   Hemoglobin 10.3 (L) 12.0 - 15.0 g/dL   HCT 32.9 (L) 36.0 - 46.0 %   MCV 95.1 80.0 - 100.0 fL   MCH 29.8 26.0 - 34.0 pg   MCHC 31.3 30.0 - 36.0 g/dL   RDW 14.8 11.5 - 15.5 %   Platelets 236 150 - 400 K/uL   nRBC 0.0 0.0 - 0.2 %   Neutrophils Relative % 82 %   Neutro Abs 6.3 1.7 - 7.7 K/uL   Lymphocytes Relative 11 %   Lymphs Abs 0.9 0.7 - 4.0 K/uL   Monocytes Relative 7 %   Monocytes Absolute 0.5 0.1 - 1.0 K/uL   Eosinophils Relative 0 %   Eosinophils Absolute 0.0 0.0 - 0.5 K/uL    Basophils Relative 0 %   Basophils Absolute 0.0 0.0 - 0.1 K/uL   Immature Granulocytes 0 %   Abs Immature Granulocytes 0.02 0.00 - 0.07 K/uL    Comment: Performed at [facility], [place] [address] [place], [place] [address]   Dg Chest 2 View  Result Date: 07-04-2017 CLINICAL DATA:  Left femur fracture.  Pre-op respiratory exam EXAM: CHEST - 2 VIEW COMPARISON:  01/25/2017 and CT on 09/28/2012 FINDINGS: Stable mild cardiomegaly. Aortic atherosclerosis. Right paratracheal mass is again seen, consistent with dilated thoracic esophagus as demonstrated on previous CT. No evidence of pulmonary infiltrate or edema. No significant pleural effusion. Surgical clips again seen in left axilla. IMPRESSION: No active lung disease. Stable right paratracheal mass, consistent with dilated thoracic esophagus. Stable mild cardiomegaly. Electronically Signed   By: Earle Gell M.D.   On: 07-04-2017 18:28   Dg Knee 1-2 Views Left  Result Date: 07-04-2017 CLINICAL DATA:  Left femur fracture. EXAM: LEFT KNEE - 1-2 VIEW COMPARISON:  02/27/2018 FINDINGS: Bones are diffusely demineralized. Comminuted fracture of the distal femoral metaphysis evident although assessment limited by patient positioning. No definite evidence of fracture extension to the weightbearing articular surface. There is advanced tricompartmental degenerative change in the knee. IMPRESSION: Comminuted fracture of the distal femoral metaphysis without definite extension to the weight-bearing articular surface of the femoral condyles. Osteopenia. Electronically Signed   By: Misty Stanley M.D.   On: 07-04-2017 18:25   Dg Knee Complete 4 Views Right  Result Date: 07-04-2017 CLINICAL DATA:  Fall EXAM: RIGHT KNEE - COMPLETE 4+ VIEW COMPARISON:  01/25/2017 FINDINGS: No fracture or malalignment. Severe tricompartment arthritis with joint space narrowing, subarticular sclerosis and osteophyte formation. Small moderate knee effusion. IMPRESSION:  1. No acute osseous abnormality. 2. Advanced arthritis of the knee with small moderate knee effusion Electronically Signed   By: Donavan Foil M.D.   On: 07-04-2017 18:26   Dg Femur Min 2 Views Left  Result Date: 07-04-2017 CLINICAL DATA:  Fall, history of fracture EXAM: LEFT FEMUR 2 VIEWS COMPARISON:  02/27/2018, 07-04-2017 FINDINGS: Left femoral head  projects in joint. Moderate inferior degenerative changes at the glenohumeral interval. Acute mildly comminuted distal femoral fracture involving the metaphysis. 1/2 bone with posterior displacement and lateral displacement of distal fracture fragment. No knee dislocation. Advanced arthritis at the knee. Vascular calcifications. IMPRESSION: Acute mildly comminuted and displaced distal femoral fracture. Electronically Signed   By: Donavan Foil M.D.   On: August 03, 202019 18:32   EKG Interpretation  Date/Time:  Sunday June 15 2018 17:07:14 EST Ventricular Rate:  83 PR Interval:    QRS Duration: 88 QT Interval:  391 QTC Calculation: 460 R Axis:   15 Text Interpretation:  Sinus rhythm Anterior infarct, old Confirmed by Haviland, Julie ([id number]) on 06/15/2018 5:12:19 PM   Pending Labs Unresulted Labs (From admission, onward)    Start     Ordered   06/16/18 0500  CBC  Tomorrow morning,   R     06/15/18 1958   06/16/18 0500  Basic metabolic panel  Tomorrow morning,   R     06/15/18 1958   06/15/18 2014  Urine Culture  Add-on,   R     06/15/18 2013   06/15/18 1658  Urinalysis, Routine w reflex microscopic  ONCE - STAT,   STAT     12 /22/19 1658          Vitals/Pain Today's Vitals   06/15/18 1942 06/15/18 1943 06/15/18 2045 06/15/18 2100  BP:    (!) 102/55  Pulse:   84   Resp:   (!) 26 (!) 28  Temp:      TempSrc:      SpO2:   100%   PainSc: 8  8       Isolation Precautions No active isolations  Medications Medications  fentaNYL (SUBLIMAZE) injection 25 mcg (25 mcg Intravenous Given 06/15/18 1734)  HYDROcodone-acetaminophen  (NORCO/VICODIN) 5-325 MG per tablet 1-2 tablet (2 tablets Oral Given 06/15/18 2054)  morphine 2 MG/ML injection 0.5 mg (has no administration in time range)  enoxaparin (LOVENOX) injection 40 mg (has no administration in time range)  pantoprazole (PROTONIX) EC tablet 40 mg (has no administration in time range)  ENSURE PLUS liquid 237 mL (has no administration in time range)  potassium chloride (K-DUR) CR tablet 10 mEq (has no administration in time range)  latanoprost (XALATAN) 0.005 % ophthalmic solution 1 drop (has no administration in time range)  ondansetron (ZOFRAN) injection 4 mg (4 mg Intravenous Given 06/15/18 1733)  morphine 4 MG/ML injection 4 mg (4 mg Intravenous Given 06/15/18 1849)    Mobility walks

## 2018-06-15 NOTE — ED Notes (Signed)
Dr. Smith at bedside.

## 2018-06-15 NOTE — ED Notes (Signed)
[facility] called for transport. 

## 2018-06-15 NOTE — ED Triage Notes (Signed)
Pt fell earlier today. Was seen at facility where had xray done that showed left femur fracture.

## 2018-06-15 NOTE — H&P (Addendum)
History and Physical    Veronica Phillips FIE:[id number] DOB: 1922-10-30 DOA: May 20, 202019  Referring MD/NP/PA: Isla Pence, MD  PCP: Dorothyann Peng, NP  Patient coming from:  Home Via EMS  Chief Complaint:  Fall  I have personally briefly reviewed patient's old medical records in [facility]   HPI: Veronica Phillips is a 84 y.o. female with medical history significant of HTN, CKD stage III, breast cancer, current UTIs with Klebsiella pneumonia, and frequent falls related to orthostatic hypotension; who presents after having a unwitnessed fall at home.  Patient and family help provide history.  Patient lives at home with daughter and normally ambulates with the use of a walker or in a wheelchair. The patient's daughter had been out the house, and patient reports hearing a noise for which she got up and went to the front door.  Family notes that she normally does not get up without assistance, but has been more confused intermittently.  They relate symptoms to her having frequent UTIs for which she is currently on a 90-day course of ciprofloxacin.  Patient reports that she hit her head on concrete porch.  It is unclear if she lost consciousness, but family believes that she likely lost consciousness.  He makes note that patient haslosing some weight, intermittently complains of some mild shortness of breath, is on a soft diet due to history of achalasia, intermittent nausea, and vomiting.  ED Course: Upon admission intolerance department patient was noted to be afebrile, respirations 18-23, and all other vital signs maintained.  Labs revealed WBC 7.7, hemoglobin 10.3, BUN 19, and creatinine 1.14.  X-rays revealed mildly comminuted and displaced distal femoral fracture.  Dr. Ninfa Linden of Orthopedics was consulted and recommended transfer to [facility] for possible surgery.  Patient was placed in a knee immobilizer and given fentanyl for pain. TRH called to admit.   Review of Systems  Unable to  perform ROS: Mental status change  Constitutional: Positive for weight loss. Negative for chills and fever.  HENT: Negative for nosebleeds and sinus pain.   Eyes: Negative for photophobia and pain.  Respiratory: Positive for shortness of breath. Negative for cough.   Cardiovascular: Negative for chest pain and leg swelling.  Gastrointestinal: Positive for nausea and vomiting. Negative for abdominal pain.  Genitourinary: Negative for dysuria and frequency.  Musculoskeletal: Positive for falls and joint pain.  Skin: Negative for itching and rash.  Neurological: Positive for loss of consciousness. Negative for focal weakness.  Psychiatric/Behavioral: Positive for memory loss. Negative for substance abuse.    Past Medical History:  Diagnosis Date  . Arthritis    "knees, shoulders" (12/29/2015)  . Atrophic gastritis   . Cancer of left breast ([place]) 1989  . CKD (chronic kidney disease), stage III ([place])   . Diverticulosis of colon   . GERD (gastroesophageal reflux disease)   . Glaucoma   . Headache    "awful headaches when I was coming up in my teens"  . Hypertension   . Lactose intolerance   . Pernicious anemia   . Vitamin B 12 deficiency     Past Surgical History:  Procedure Laterality Date  . BALLOON DILATION N/A 06/28/2014   Procedure: BALLOON DILATION;  Surgeon: Ladene Artist, MD;  Location: [facility];  Service: Endoscopy;  Laterality: N/A;  . BOTOX INJECTION N/A 07/09/2013   Procedure: BOTOX INJECTION;  Surgeon: Ladene Artist, MD;  Location: [facility];  Service: Endoscopy;  Laterality: N/A;  . BOTOX INJECTION N/A 06/28/2014  Procedure: BOTOX INJECTION;  Surgeon: Ladene Artist, MD;  Location: [facility];  Service: Endoscopy;  Laterality: N/A;  . CATARACT EXTRACTION W/ INTRAOCULAR LENS  IMPLANT, BILATERAL Bilateral 08-2015-10/2015  . ESOPHAGEAL MANOMETRY N/A 10/27/2012   Procedure: ESOPHAGEAL MANOMETRY (EM);  Surgeon: Ladene Artist, MD;  Location: [facility];  Service:  Endoscopy;  Laterality: N/A;  . ESOPHAGOGASTRODUODENOSCOPY N/A 07/09/2013   Procedure: ESOPHAGOGASTRODUODENOSCOPY (EGD);  Surgeon: Ladene Artist, MD;  Location: [facility];  Service: Endoscopy;  Laterality: N/A;  . ESOPHAGOGASTRODUODENOSCOPY N/A 10/07/2013   Procedure: ESOPHAGOGASTRODUODENOSCOPY (EGD);  Surgeon: Ladene Artist, MD;  Location: [facility];  Service: Endoscopy;  Laterality: N/A;  wtih botox  . ESOPHAGOGASTRODUODENOSCOPY (EGD) WITH PROPOFOL N/A 06/28/2014   Procedure: ESOPHAGOGASTRODUODENOSCOPY (EGD) WITH PROPOFOL;  Surgeon: Ladene Artist, MD;  Location: [facility];  Service: Endoscopy;  Laterality: N/A;  . MASTECTOMY Left 1989     reports that she has never smoked. She has never used smokeless tobacco. She reports that she does not drink alcohol or use drugs.  Allergies  Allergen Reactions  . Septra [Bactrim] Swelling    Site of swelling not recalled by the patient  . Sulfa Antibiotics Swelling    Site of swelling not recalled by the patient  . Tape Other (See Comments)    Skin is sensitive; please use paper tape!!    Family History  Problem Relation Age of Onset  . Hypertension Mother        family hx  . Lung cancer Father        family hx  . Stroke Other        family hx  . Heart disease Sister        family hx    Prior to Admission medications   Medication Sig Start Date End Date Taking? Authorizing Provider  Ascorbic Acid (VITAMIN C PO) Take 1 tablet by mouth at bedtime.    Yes [provider]  BIOTIN PO Take 1 tablet by mouth every evening.   Yes [provider]  ciprofloxacin (CIPRO) 250 MG tablet Take 0.5 tablets (125 mg total) by mouth daily. 05/12/18 08/10/18 Yes Nafziger, [name], NP  Cyanocobalamin (VITAMIN B-12 PO) Take 1 tablet by mouth at bedtime.    Yes [provider]  ENSURE PLUS (ENSURE PLUS) LIQD Take 237 mLs by mouth daily.   Yes [provider]  ferrous gluconate (FERGON) 325 MG tablet Take 325 mg by  mouth daily after supper.    Yes [provider]  latanoprost (XALATAN) 0.005 % ophthalmic solution Place 1 drop into both eyes at bedtime.  12/03/14  Yes [provider]  Multiple Vitamins-Minerals (ONE-A-DAY WOMENS 50 PLUS PO) Take 1 tablet by mouth daily after supper.    Yes [provider]  multivitamin-lutein (OCUVITE-LUTEIN) CAPS capsule Take 1 capsule by mouth daily.   Yes [provider]  omeprazole (PRILOSEC) 20 MG capsule Take 1 capsule (20 mg total) by mouth daily. 05/21/18  Yes Nafziger, [name], NP  potassium chloride (K-DUR) 10 MEQ tablet Take 1 tablet (10 mEq total) by mouth daily. 05/21/18  Yes Nafziger, [name], NP  vitamin E 400 UNIT capsule Take 400 Units by mouth at bedtime.    Yes [provider]  acetaminophen (TYLENOL) 500 MG tablet Take 500 mg by mouth every 6 (six) hours as needed (for pain).     [provider]  fluticasone (FLONASE) 50 MCG/ACT nasal spray Place 2 sprays into both nostrils daily. Patient not taking: Reported  on 2020/04/1218 11/23/16   Dorothyann Peng, NP  Nutritional Supplements (FEEDING SUPPLEMENT, BOOST BREEZE,) LIQD Take 1 Can by mouth 3 (three) times daily. Patient not taking: Reported on 2020/04/1218 03/13/18   Bonnell Public, MD    Physical Exam:  Constitutional: Elderly female in NAD, calm, comfortable Vitals:   06/15/18 1631 06/15/18 1900  BP: 124/77 (!) 142/78  Pulse: 64 84  Resp: 18 (!) 23  Temp: 97.6 F (36.4 C)   TempSrc: Oral   SpO2: 100% 98%   Eyes: PERRL, lids and conjunctivae normal ENMT: Mucous membranes are dry. Posterior pharynx clear of any exudate or lesions.   Neck: normal, supple, no masses, no thyromegaly Respiratory: clear to auscultation bilaterally, no wheezing, no crackles. Normal respiratory effort. No accessory muscle use.  Cardiovascular: Regular rate and rhythm, no murmurs / rubs / gallops. No extremity edema. 2+ pedal pulses. No carotid bruits.  Abdomen: no  tenderness, no masses palpated. No hepatosplenomegaly. Bowel sounds positive.  Musculoskeletal: no clubbing / cyanosis.  Left leg in knee immobilizer Skin: no rashes, lesions, ulcers. No induration Neurologic: CN 2-12 grossly intact. Sensation intact, DTR normal. Strength 5/5 in all 4.  Psychiatric: Intermittently confused. Alert and oriented to person, place, and circumstance. Normal mood.     Labs on Admission: I have personally reviewed following labs and imaging studies  CBC: Recent Labs  Lab 06/15/18 1728  WBC 7.7  NEUTROABS 6.3  HGB 10.3*  HCT 32.9*  MCV 95.1  PLT 161   Basic Metabolic Panel: Recent Labs  Lab 06/15/18 1728  NA 143  K 3.8  CL 106  CO2 26  GLUCOSE 123*  BUN 19  CREATININE 1.14*  CALCIUM 8.4*   GFR: CrCl cannot be calculated (Unknown ideal weight.). Liver Function Tests: Recent Labs  Lab 06/15/18 1728  AST 22  ALT 16  ALKPHOS 50  BILITOT 0.7  PROT 6.2*  ALBUMIN 3.5   No results for input(s): LIPASE, AMYLASE in the last 168 hours. No results for input(s): AMMONIA in the last 168 hours. Coagulation Profile: No results for input(s): INR, PROTIME in the last 168 hours. Cardiac Enzymes: No results for input(s): CKTOTAL, CKMB, CKMBINDEX, TROPONINI in the last 168 hours. BNP (last 3 results) No results for input(s): PROBNP in the last 8760 hours. HbA1C: No results for input(s): HGBA1C in the last 72 hours. CBG: No results for input(s): GLUCAP in the last 168 hours. Lipid Profile: No results for input(s): CHOL, HDL, LDLCALC, TRIG, CHOLHDL, LDLDIRECT in the last 72 hours. Thyroid Function Tests: No results for input(s): TSH, T4TOTAL, FREET4, T3FREE, THYROIDAB in the last 72 hours. Anemia Panel: No results for input(s): VITAMINB12, FOLATE, FERRITIN, TIBC, IRON, RETICCTPCT in the last 72 hours. Urine analysis:    Component Value Date/Time   COLORURINE YELLOW 04/17/2018 1455   APPEARANCEUR Sl Cloudy (A) 04/17/2018 1455   LABSPEC 1.025  04/17/2018 1455   PHURINE 6.0 04/17/2018 1455   GLUCOSEU NEGATIVE 04/17/2018 1455   HGBUR TRACE-INTACT (A) 04/17/2018 1455   HGBUR 2+ 07/11/2010 0921   BILIRUBINUR 1+ 05/08/2018 1058   KETONESUR NEGATIVE 04/17/2018 1455   PROTEINUR Positive (A) 05/08/2018 1058   PROTEINUR 30 (A) 03/11/2018 0012   UROBILINOGEN 0.2 05/08/2018 1058   UROBILINOGEN 0.2 04/17/2018 1455   NITRITE n 05/08/2018 1058   NITRITE NEGATIVE 04/17/2018 1455   LEUKOCYTESUR Small (1+) (A) 05/08/2018 1058   Sepsis Labs: No results found for this or any previous visit (from the past 240 hour(s)).   Radiological Exams  on Admission: Dg Chest 2 View  Result Date: August 22, 202019 CLINICAL DATA:  Left femur fracture.  Pre-op respiratory exam EXAM: CHEST - 2 VIEW COMPARISON:  01/25/2017 and CT on 09/28/2012 FINDINGS: Stable mild cardiomegaly. Aortic atherosclerosis. Right paratracheal mass is again seen, consistent with dilated thoracic esophagus as demonstrated on previous CT. No evidence of pulmonary infiltrate or edema. No significant pleural effusion. Surgical clips again seen in left axilla. IMPRESSION: No active lung disease. Stable right paratracheal mass, consistent with dilated thoracic esophagus. Stable mild cardiomegaly. Electronically Signed   By: Earle Gell M.D.   On: August 22, 202019 18:28   Dg Knee 1-2 Views Left  Result Date: August 22, 202019 CLINICAL DATA:  Left femur fracture. EXAM: LEFT KNEE - 1-2 VIEW COMPARISON:  02/27/2018 FINDINGS: Bones are diffusely demineralized. Comminuted fracture of the distal femoral metaphysis evident although assessment limited by patient positioning. No definite evidence of fracture extension to the weightbearing articular surface. There is advanced tricompartmental degenerative change in the knee. IMPRESSION: Comminuted fracture of the distal femoral metaphysis without definite extension to the weight-bearing articular surface of the femoral condyles. Osteopenia. Electronically Signed   By: Misty Stanley M.D.   On: August 22, 202019 18:25   Dg Knee Complete 4 Views Right  Result Date: August 22, 202019 CLINICAL DATA:  Fall EXAM: RIGHT KNEE - COMPLETE 4+ VIEW COMPARISON:  01/25/2017 FINDINGS: No fracture or malalignment. Severe tricompartment arthritis with joint space narrowing, subarticular sclerosis and osteophyte formation. Small moderate knee effusion. IMPRESSION: 1. No acute osseous abnormality. 2. Advanced arthritis of the knee with small moderate knee effusion Electronically Signed   By: Donavan Foil M.D.   On: August 22, 202019 18:26   Dg Femur Min 2 Views Left  Result Date: August 22, 202019 CLINICAL DATA:  Fall, history of fracture EXAM: LEFT FEMUR 2 VIEWS COMPARISON:  02/27/2018, August 22, 202019 FINDINGS: Left femoral head projects in joint. Moderate inferior degenerative changes at the glenohumeral interval. Acute mildly comminuted distal femoral fracture involving the metaphysis. 1/2 bone with posterior displacement and lateral displacement of distal fracture fragment. No knee dislocation. Advanced arthritis at the knee. Vascular calcifications. IMPRESSION: Acute mildly comminuted and displaced distal femoral fracture. Electronically Signed   By: Donavan Foil M.D.   On: August 22, 202019 18:32    EKG: Independently reviewed.  Sinus rhythm at 83 bpm  Assessment/Plan Complex left distal femur fracture 2/2 fall at home: Acute.  Patient reportedly got up unassisted and fell going outside hitting her head on concrete.  Unclear if fall was caused by patient be getting tangled up vs. orthostatic hypotension vs. other.  Patient was found to have communicated distal femur fracture on the left.  Dr. Ninfa Linden of orthopedics consulted and plan on doing surgery sometime next week. - Admit to a telemetry bed at [facility]  - Hip fracture order set initiated - Pain control - Social work and care management order set initiated - F/U  - Appreciate orthopedic consultative services, will follow-up for further  recommendations  Acute encephalopathy: Patient intermittently reported to be confused and altered.  CT scan of the brain did not show any acute abnormalities.  Question of secondary to underlying infection versus dementia/sundowning versus other.  - Neurochecks overnight - May warrant further investigation  Recurrent UTIs: Review of records shows long history of urinary tract infections dating back to 2014 with Klebsiella pneumoniae that is starting to develop resistance based off of last culture from 11/18 2019.  Patient currently has been on a 90-day course of 250 mg of Cipro daily. - Follow-up urinalysis and  urine culture - Rocephin IV if urinalysis shows signs of infection.  Anemia of chronic disease:stable. Hemoglobin 10.3 on admission which seems near patient's baseline. - check CBC in a.m.  CKD stage III: Patient creatinine appears near baseline at 1.14. - Continue to montior  Orthostatic hypotension:Blood pressure stable. Patient not on any medication to treat for the symptoms.  History of hypokalemia: Patient currently taking 10 mEq of potassium chloride.  Initial potassium noted to be within normal limits at 3.8. - Continue daily potassium supplementation  Achalasia and GERD: Patient intermittently has difficulty swallowing certain food, and family reports should be on a soft diet. - Aspiration precautions - Soft diet - Continue to pharmacy substitution of Protonix  DVT prophylaxis: Lovenox   Code Status: Full code Family Communication: Discussed plan of care with the patient and family present at bedside Disposition Plan: TBD  Consults called: ortho  Admission status: inpatient   Norval Morton MD [facility] Pager [phone]   If 7PM-7AM, please contact night-coverage [email or web address] Password [facility]  2020-05-2818, 7:31 PM

## 2018-06-15 NOTE — ED Notes (Signed)
Patient transported to X-ray 

## 2018-06-15 NOTE — Progress Notes (Signed)
Patient ID: Veronica Phillips, female   DOB: [birth year], 84 y.o.   MRN: [id number] I have reviewed the patient's x-rays. She has a very complex left distal femur fracture.  This will require surgery.  Based on OR availability and surgeon availability, she will need to be transferred to [facility] where the definitive surgery can be performed this week.  She will also need to be admitted to the Medical Service for medical optimization and surgical clearance.  For no, she will need a knee immobilizer and ice with non-weight bearing on her left leg.

## 2018-06-16 ENCOUNTER — Inpatient Hospital Stay (HOSPITAL_COMMUNITY): Payer: Medicare Other

## 2018-06-16 ENCOUNTER — Encounter (HOSPITAL_COMMUNITY)
Admission: EM | Disposition: A | Discharge status: Skilled Nursing Facility | Payer: Self-pay | Source: Ambulatory Visit | Attending: Family Medicine

## 2018-06-16 ENCOUNTER — Other Ambulatory Visit: Payer: Self-pay

## 2018-06-16 ENCOUNTER — Inpatient Hospital Stay (HOSPITAL_COMMUNITY): Payer: Medicare Other | Admitting: Certified Registered"

## 2018-06-16 DIAGNOSIS — S72452A Displaced supracondylar fracture without intracondylar extension of lower end of left femur, initial encounter for closed fracture: Principal | ICD-10-CM

## 2018-06-16 DIAGNOSIS — L8990 Pressure ulcer of unspecified site, unspecified stage: Secondary | ICD-10-CM

## 2018-06-16 HISTORY — PX: ORIF FEMUR FRACTURE: SHX2119

## 2018-06-16 LAB — URINALYSIS, ROUTINE W REFLEX MICROSCOPIC
Bilirubin Urine: NEGATIVE
Glucose, UA: NEGATIVE mg/dL
Ketones, ur: NEGATIVE mg/dL
Nitrite: NEGATIVE
Protein, ur: NEGATIVE mg/dL
Specific Gravity, Urine: 1.02 (ref 1.005–1.030)
pH: 5.5 (ref 5.0–8.0)

## 2018-06-16 LAB — MRSA PCR SCREENING: MRSA by PCR: NEGATIVE

## 2018-06-16 LAB — URINALYSIS, MICROSCOPIC (REFLEX): RBC / HPF: >50 RBC/hpf (ref 0–5)

## 2018-06-16 LAB — ABO/RH: ABO/RH(D): A POS

## 2018-06-16 SURGERY — OPEN REDUCTION INTERNAL FIXATION (ORIF) DISTAL FEMUR FRACTURE
Anesthesia: General | Site: Leg Upper | Laterality: Left

## 2018-06-16 MED ORDER — MEPERIDINE HCL 50 MG/ML IJ SOLN: 6.2500 mg | INTRAMUSCULAR | Status: DC | PRN

## 2018-06-16 MED ORDER — MENTHOL 3 MG MT LOZG: 1.0000 | LOZENGE | OROMUCOSAL | Status: DC | PRN

## 2018-06-16 MED ORDER — ROCURONIUM BROMIDE 50 MG/5ML IV SOSY
PREFILLED_SYRINGE | INTRAVENOUS | Status: DC | PRN
  Administered 2018-06-16: 10 mg via INTRAVENOUS
  Administered 2018-06-16: 50 mg via INTRAVENOUS

## 2018-06-16 MED ORDER — CEFAZOLIN SODIUM-DEXTROSE 2-4 GM/100ML-% IV SOLN
2.0000 g | Freq: Once | INTRAVENOUS | Status: AC
  Administered 2018-06-16: 2 g via INTRAVENOUS

## 2018-06-16 MED ORDER — PROPOFOL 10 MG/ML IV BOLUS
INTRAVENOUS | Status: AC
  Filled 2018-06-16: qty 20

## 2018-06-16 MED ORDER — FENTANYL CITRATE (PF) 100 MCG/2ML IJ SOLN
INTRAMUSCULAR | Status: DC | PRN
  Administered 2018-06-16 (×2): 50 ug via INTRAVENOUS

## 2018-06-16 MED ORDER — PHENOL 1.4 % MT LIQD: 1.0000 | OROMUCOSAL | Status: DC | PRN

## 2018-06-16 MED ORDER — ROCURONIUM BROMIDE 50 MG/5ML IV SOSY
PREFILLED_SYRINGE | INTRAVENOUS | Status: AC
  Filled 2018-06-16: qty 5

## 2018-06-16 MED ORDER — ACETAMINOPHEN 325 MG PO TABS
325.0000 mg | ORAL_TABLET | Freq: Four times a day (QID) | ORAL | Status: DC | PRN
  Administered 2018-06-18 – 2018-06-23 (×5): 650 mg via ORAL
  Filled 2018-06-16 (×5): qty 2

## 2018-06-16 MED ORDER — 0.9 % SODIUM CHLORIDE (POUR BTL) OPTIME
TOPICAL | Status: DC | PRN
  Administered 2018-06-16: 1000 mL

## 2018-06-16 MED ORDER — SODIUM CHLORIDE 0.9% IV SOLUTION
Freq: Once | INTRAVENOUS | Status: AC
  Administered 2018-06-17: 07:00:00 via INTRAVENOUS

## 2018-06-16 MED ORDER — CEFAZOLIN SODIUM-DEXTROSE 2-4 GM/100ML-% IV SOLN
2.0000 g | Freq: Four times a day (QID) | INTRAVENOUS | Status: AC
  Administered 2018-06-16 – 2018-06-17 (×3): 2 g via INTRAVENOUS
  Filled 2018-06-16 (×3): qty 100

## 2018-06-16 MED ORDER — CIPROFLOXACIN HCL 250 MG PO TABS
125.0000 mg | ORAL_TABLET | Freq: Every day | ORAL | Status: DC
  Administered 2018-06-16 – 2018-06-17 (×2): 125 mg via ORAL
  Filled 2018-06-16 (×2): qty 0.5

## 2018-06-16 MED ORDER — LIDOCAINE 2% (20 MG/ML) 5 ML SYRINGE
INTRAMUSCULAR | Status: AC
  Filled 2018-06-16: qty 5

## 2018-06-16 MED ORDER — SODIUM CHLORIDE 0.9 % IV SOLN
INTRAVENOUS | Status: DC
  Administered 2018-06-16: 19:00:00 via INTRAVENOUS

## 2018-06-16 MED ORDER — ALUM & MAG HYDROXIDE-SIMETH 200-200-20 MG/5ML PO SUSP
30.0000 mL | ORAL | Status: DC | PRN
  Administered 2018-06-19 – 2018-06-23 (×3): 30 mL via ORAL
  Filled 2018-06-16 (×2): qty 30

## 2018-06-16 MED ORDER — ONDANSETRON HCL 4 MG/2ML IJ SOLN: 4.0000 mg | Freq: Once | INTRAMUSCULAR | Status: DC | PRN

## 2018-06-16 MED ORDER — HYDROCODONE-ACETAMINOPHEN 5-325 MG PO TABS
1.0000 | ORAL_TABLET | ORAL | Status: DC | PRN
  Administered 2018-06-16 – 2018-06-23 (×5): 1 via ORAL
  Filled 2018-06-16: qty 1

## 2018-06-16 MED ORDER — LIDOCAINE 2% (20 MG/ML) 5 ML SYRINGE
INTRAMUSCULAR | Status: DC | PRN
  Administered 2018-06-16: 40 mg via INTRAVENOUS

## 2018-06-16 MED ORDER — PROPOFOL 10 MG/ML IV BOLUS
INTRAVENOUS | Status: DC | PRN
  Administered 2018-06-16: 60 mg via INTRAVENOUS

## 2018-06-16 MED ORDER — LACTATED RINGERS IV SOLN
INTRAVENOUS | Status: DC
  Administered 2018-06-16: 13:00:00 via INTRAVENOUS

## 2018-06-16 MED ORDER — METHOCARBAMOL 1000 MG/10ML IJ SOLN
500.0000 mg | Freq: Four times a day (QID) | INTRAVENOUS | Status: DC | PRN
  Filled 2018-06-16: qty 5

## 2018-06-16 MED ORDER — ENOXAPARIN SODIUM 40 MG/0.4ML ~~LOC~~ SOLN
40.0000 mg | SUBCUTANEOUS | Status: DC
  Administered 2018-06-17 – 2018-06-19 (×3): 40 mg via SUBCUTANEOUS
  Filled 2018-06-16 (×3): qty 0.4

## 2018-06-16 MED ORDER — ACETAMINOPHEN 500 MG PO TABS
500.0000 mg | ORAL_TABLET | Freq: Four times a day (QID) | ORAL | Status: AC
  Administered 2018-06-16 – 2018-06-17 (×4): 500 mg via ORAL
  Filled 2018-06-16 (×3): qty 1

## 2018-06-16 MED ORDER — FENTANYL CITRATE (PF) 100 MCG/2ML IJ SOLN: 25.0000 ug | INTRAMUSCULAR | Status: DC | PRN

## 2018-06-16 MED ORDER — DEXAMETHASONE SODIUM PHOSPHATE 10 MG/ML IJ SOLN
INTRAMUSCULAR | Status: AC
  Filled 2018-06-16: qty 1

## 2018-06-16 MED ORDER — ONDANSETRON HCL 4 MG/2ML IJ SOLN
INTRAMUSCULAR | Status: DC | PRN
  Administered 2018-06-16: 4 mg via INTRAVENOUS

## 2018-06-16 MED ORDER — OXYCODONE HCL 5 MG PO TABS: 5.0000 mg | ORAL_TABLET | ORAL | 0 refills | Status: DC | PRN

## 2018-06-16 MED ORDER — SUGAMMADEX SODIUM 200 MG/2ML IV SOLN
INTRAVENOUS | Status: DC | PRN
  Administered 2018-06-16: 150 mg via INTRAVENOUS

## 2018-06-16 MED ORDER — ONDANSETRON HCL 4 MG PO TABS: 4.0000 mg | ORAL_TABLET | Freq: Four times a day (QID) | ORAL | Status: DC | PRN

## 2018-06-16 MED ORDER — SORBITOL 70 % SOLN
30.0000 mL | Freq: Every day | Status: DC | PRN
  Filled 2018-06-16: qty 30

## 2018-06-16 MED ORDER — MORPHINE SULFATE (PF) 2 MG/ML IV SOLN: 1.0000 mg | INTRAVENOUS | Status: DC | PRN

## 2018-06-16 MED ORDER — ONDANSETRON HCL 4 MG/2ML IJ SOLN
INTRAMUSCULAR | Status: AC
  Filled 2018-06-16: qty 2

## 2018-06-16 MED ORDER — CEFAZOLIN SODIUM-DEXTROSE 2-4 GM/100ML-% IV SOLN
INTRAVENOUS | Status: AC
  Filled 2018-06-16: qty 100

## 2018-06-16 MED ORDER — EPHEDRINE 5 MG/ML INJ
INTRAVENOUS | Status: AC
  Filled 2018-06-16: qty 10

## 2018-06-16 MED ORDER — POLYETHYLENE GLYCOL 3350 17 G PO PACK: 17.0000 g | PACK | Freq: Every day | ORAL | Status: DC | PRN

## 2018-06-16 MED ORDER — DOCUSATE SODIUM 100 MG PO CAPS
100.0000 mg | ORAL_CAPSULE | Freq: Two times a day (BID) | ORAL | Status: DC
  Administered 2018-06-16 – 2018-06-17 (×2): 100 mg via ORAL
  Filled 2018-06-16 (×2): qty 1

## 2018-06-16 MED ORDER — PHENYLEPHRINE 40 MCG/ML (10ML) SYRINGE FOR IV PUSH (FOR BLOOD PRESSURE SUPPORT)
PREFILLED_SYRINGE | INTRAVENOUS | Status: AC
  Filled 2018-06-16: qty 10

## 2018-06-16 MED ORDER — PHENYLEPHRINE 40 MCG/ML (10ML) SYRINGE FOR IV PUSH (FOR BLOOD PRESSURE SUPPORT)
PREFILLED_SYRINGE | INTRAVENOUS | Status: DC | PRN
  Administered 2018-06-16: 40 ug via INTRAVENOUS
  Administered 2018-06-16: 80 ug via INTRAVENOUS
  Administered 2018-06-16: 40 ug via INTRAVENOUS
  Administered 2018-06-16 (×3): 80 ug via INTRAVENOUS

## 2018-06-16 MED ORDER — FENTANYL CITRATE (PF) 250 MCG/5ML IJ SOLN
INTRAMUSCULAR | Status: AC
  Filled 2018-06-16: qty 5

## 2018-06-16 MED ORDER — SODIUM CHLORIDE 0.9 % IV SOLN
INTRAVENOUS | Status: DC | PRN
  Administered 2018-06-16: 40 ug/min via INTRAVENOUS

## 2018-06-16 MED ORDER — LACTATED RINGERS IV SOLN
INTRAVENOUS | Status: DC | PRN
  Administered 2018-06-16: 13:00:00 via INTRAVENOUS

## 2018-06-16 MED ORDER — HYDROCODONE-ACETAMINOPHEN 7.5-325 MG PO TABS: 1.0000 | ORAL_TABLET | ORAL | Status: DC | PRN

## 2018-06-16 MED ORDER — DEXAMETHASONE SODIUM PHOSPHATE 10 MG/ML IJ SOLN
INTRAMUSCULAR | Status: DC | PRN
  Administered 2018-06-16: 8 mg via INTRAVENOUS

## 2018-06-16 MED ORDER — ONDANSETRON HCL 4 MG/2ML IJ SOLN: 4.0000 mg | Freq: Four times a day (QID) | INTRAMUSCULAR | Status: DC | PRN

## 2018-06-16 MED ORDER — ENOXAPARIN SODIUM 40 MG/0.4ML ~~LOC~~ SOLN: 40.0000 mg | Freq: Every day | SUBCUTANEOUS | 0 refills | Status: DC

## 2018-06-16 MED ORDER — METHOCARBAMOL 500 MG PO TABS: 500.0000 mg | ORAL_TABLET | Freq: Four times a day (QID) | ORAL | Status: DC | PRN

## 2018-06-16 MED ORDER — MAGNESIUM CITRATE PO SOLN: 1.0000 | Freq: Once | ORAL | Status: DC | PRN

## 2018-06-16 SURGICAL SUPPLY — 46 items
BIT DRILL CALIBRATED 4.3MMX365 (DRILL) ×1 IMPLANT
BIT DRILL CROWE PNT TWST 4.5MM (DRILL) ×2 IMPLANT
BLADE CLIPPER SURG (BLADE) IMPLANT
COVER WAND RF STERILE (DRAPES) IMPLANT
DRAPE C-ARM 42X72 X-RAY (DRAPES) ×2 IMPLANT
DRAPE C-ARMOR (DRAPES) ×2 IMPLANT
DRAPE IMP U-DRAPE 54X76 (DRAPES) ×4 IMPLANT
DRAPE ORTHO SPLIT 77X108 STRL (DRAPES) ×2
DRAPE SURG ORHT 6 SPLT 77X108 (DRAPES) ×2 IMPLANT
DRAPE U-SHAPE 47X51 STRL (DRAPES) ×2 IMPLANT
DRILL CALIBRATED 4.3MMX365 (DRILL) ×2
DRILL CROWE POINT TWIST 4.5MM (DRILL) ×4
ELECT REM PT RETURN 9FT ADLT (ELECTROSURGICAL) ×2
ELECTRODE REM PT RTRN 9FT ADLT (ELECTROSURGICAL) ×1 IMPLANT
GLOVE BIOGEL PI IND STRL 7.0 (GLOVE) ×1 IMPLANT
GLOVE BIOGEL PI INDICATOR 7.0 (GLOVE) ×1
GLOVE ECLIPSE 7.0 STRL STRAW (GLOVE) ×2 IMPLANT
GLOVE SKINSENSE NS SZ7.5 (GLOVE) ×2
GLOVE SKINSENSE STRL SZ7.5 (GLOVE) ×2 IMPLANT
GOWN STRL REIN XL XLG (GOWN DISPOSABLE) ×4 IMPLANT
GUIDEPIN 3.2X17.5 THRD DISP (PIN) ×2 IMPLANT
GUIDEWIRE BEAD TIP (WIRE) ×2 IMPLANT
IMMOBILIZER KNEE 22  40 CIR (ORTHOPEDIC SUPPLIES) ×1
IMMOBILIZER KNEE 22 40 CIR (ORTHOPEDIC SUPPLIES) ×1 IMPLANT
KIT BASIN OR (CUSTOM PROCEDURE TRAY) ×2 IMPLANT
KIT TURNOVER KIT B (KITS) ×2 IMPLANT
MANIFOLD NEPTUNE II (INSTRUMENTS) ×2 IMPLANT
NAIL FEM RETRO 13.5X360 (Nail) ×2 IMPLANT
NS IRRIG 1000ML POUR BTL (IV SOLUTION) ×2 IMPLANT
PACK GENERAL/GYN (CUSTOM PROCEDURE TRAY) ×2 IMPLANT
PACK UNIVERSAL I (CUSTOM PROCEDURE TRAY) ×2 IMPLANT
PAD ARMBOARD 7.5X6 YLW CONV (MISCELLANEOUS) ×2 IMPLANT
SCREW CORT TI DBL LEAD 5X34 (Screw) ×2 IMPLANT
SCREW CORT TI DBL LEAD 5X36 (Screw) ×2 IMPLANT
SCREW CORT TI DBL LEAD 5X60 (Screw) ×2 IMPLANT
SCREW CORT TI DBL LEAD 5X70 (Screw) ×2 IMPLANT
SCREW CORT TI DBL LEAD 5X75 (Screw) ×2 IMPLANT
SCREW CORT TI DBLE LEAD 5X54 (Screw) ×2 IMPLANT
STAPLER VISISTAT 35W (STAPLE) ×2 IMPLANT
SUT ETHILON 2 0 FS 18 (SUTURE) IMPLANT
SUT VIC AB 0 CT1 27 (SUTURE) ×1
SUT VIC AB 0 CT1 27XBRD ANBCTR (SUTURE) ×1 IMPLANT
SUT VIC AB 2-0 CT1 36 (SUTURE) ×2 IMPLANT
TOWEL OR 17X24 6PK STRL BLUE (TOWEL DISPOSABLE) ×2 IMPLANT
TOWEL OR 17X26 10 PK STRL BLUE (TOWEL DISPOSABLE) ×4 IMPLANT
WATER STERILE IRR 1000ML POUR (IV SOLUTION) ×4 IMPLANT

## 2018-06-16 NOTE — Discharge Instructions (Signed)
° ° °  1. Change dressings as needed °2. May shower but keep incisions covered and dry °3. Take lovenox to prevent blood clots °4. Take stool softeners as needed °5. Take pain meds as needed ° °

## 2018-06-16 NOTE — Progress Notes (Addendum)
1200 Pt to Short stay with daughter present. NPO post midnight maint, Pt signed consent for surgery. 1735 Received pt from PACU, awake, a little confused. Left hip dressing dry and intact, LLE with ace wrap dry and intact. Denies pain at this time.

## 2018-06-16 NOTE — Progress Notes (Signed)
Patient came to Short Stay with a signed consent by patient. However on arrival patient unable to state surgery (had hydrocodone at 1000). Requested that daughter resign consent. Verified armband matches patient's stated name and birth date. Verified NPO status and that all jewelry, contact, glasses, dentures, and partials had been removed (if applicable).

## 2018-06-16 NOTE — Progress Notes (Signed)
Patient ID: Veronica Phillips, female   DOB: 1923/01/25, 84 y.o.   MRN: [id number] I have spoken to the patient about her left femur fracture and the recommendation for surgery today.  I will communicate this with the family.  She has been NPO since midnight.  I have spoken with my partner Dr. Erlinda Hong, who will see her and perform surgery today if the family also agrees.

## 2018-06-16 NOTE — Plan of Care (Signed)
  Problem: Pain Managment: Goal: General experience of comfort will improve Outcome: Progressing   Problem: Safety: Goal: Ability to remain free from injury will improve Outcome: Progressing   

## 2018-06-16 NOTE — Anesthesia Preprocedure Evaluation (Addendum)
Anesthesia Evaluation  Patient identified by MRN, date of birth, ID band Patient awake    Reviewed: Allergy & Precautions, H&P , NPO status , Patient's Chart, lab work & pertinent test results  Airway Mallampati: II  TM Distance: >3 FB Neck ROM: Full    Dental  (+) Dental Advisory Given   Pulmonary neg pulmonary ROS,    Pulmonary exam normal breath sounds clear to auscultation       Cardiovascular hypertension, negative cardio ROS Normal cardiovascular exam Rhythm:Regular Rate:Normal     Neuro/Psych  Headaches, negative psych ROS   GI/Hepatic Neg liver ROS, GERD  Medicated,  Endo/Other  negative endocrine ROS  Renal/GU Renal InsufficiencyRenal disease     Musculoskeletal  (+) Arthritis ,   Abdominal   Peds  Hematology  (+) anemia ,   Anesthesia Other Findings   Reproductive/Obstetrics negative OB ROS                             Anesthesia Physical  Anesthesia Plan  ASA: III  Anesthesia Plan:    Post-op Pain Management:    Induction:   PONV Risk Score and Plan:   Airway Management Planned:   Additional Equipment:   Intra-op Plan:   Post-operative Plan:   Informed Consent: I have reviewed the patients History and Physical, chart, labs and discussed the procedure including the risks, benefits and alternatives for the proposed anesthesia with the patient or authorized representative who has indicated his/her understanding and acceptance.   Dental advisory given  Plan Discussed with: CRNA  Anesthesia Plan Comments:         Anesthesia Quick Evaluation

## 2018-06-16 NOTE — Progress Notes (Signed)
PROGRESS NOTE  Veronica Phillips UTM:[id number] DOB: 04/01/1923 DOA: 07-Aug-202019 PCP: Dorothyann Peng, NP  HPI/Recap of past 24 hours: Veronica Phillips is a 84 y.o. female with medical history significant of HTN, CKD stage III, breast cancer, current UTIs with Klebsiella pneumonia, and frequent falls related to orthostatic hypotension; who presents after having a unwitnessed fall at home.  Patient and family help provide history.  Patient lives at home with daughter and normally ambulates with the use of a walker or in a wheelchair. The patient's daughter had been out the house, and patient reports hearing a noise for which she got up and went to the front door.  Family notes that she normally does not get up without assistance, but has been more confused intermittently.  They relate symptoms to her having frequent UTIs for which she is currently on a 90-day course of ciprofloxacin.  Patient reports that she hit her head on concrete porch.  It is unclear if she lost consciousness, but family believes that she likely lost consciousness.  He makes note that patient haslosing some weight, intermittently complains of some mild shortness of breath, is on a soft diet due to history of achalasia, intermittent nausea, and vomiting.  06/16/2018: Seen and examined with her daughter/caretaker at bedside.  She is alert but confused.  Surgery planned today.  Assessment/Plan: Principal Problem:   Closed displaced supracondylar fracture of distal end of left femur without intracondylar extension (HCC) Active Problems:   Achalasia   Orthostatic syncope   Fall at home, initial encounter   Recurrent UTI   Acute metabolic encephalopathy   Pressure injury of skin   Left distal femur fracture secondary to fall at home Orthopedic surgery following Surgery planned today for left supracondylar femur fracture repair Pain management in place  Acute metabolic encephalopathy CT head unremarkable for any acute  intracranial findings UA remarkable for WBC 11-20 urine culture in process  History of recurrent UTI Per her daughter she is on ongoing UTI prophylactic treatment day 25/30 on p.o. ciprofloxacin Urine culture pending Continue oral antibiotic from home  Ambulatory dysfunction status post fall PT to assess when indicated Fall precautions  AKI on CKD 3, resolving Presented with creatinine of 1.41 Creatinine today 1.14 with baseline creatinine of 1.08 and GFR of 50 Avoid nephrotoxic agents/dehydration Monitor urine output  Iron deficiency anemia Monitor hemoglobin Continue home iron supplement  Orthostatic hypotension:Blood pressure stable. Patient not on any medication to treat for the symptoms.  History of hypokalemia: Patient currently taking 10 mEq of potassium chloride.  Initial potassium noted to be within normal limits at 3.8. - Continue daily potassium supplementation  Achalasia and GERD: Patient intermittently has difficulty swallowing certain food, and family reports should be on a soft diet. - Aspiration precautions - Soft diet - Continue to pharmacy substitution of Protonix   DVT prophylaxis:  Subcu Lovenox   Code Status: Full code Family Communication: Discussed plan of care with the patient daughter. Disposition Plan: TBD  Consults called: ortho      Objective: Vitals:   06/16/18 1645 06/16/18 1700 06/16/18 1715 06/16/18 1740  BP: (!) 107/57 105/70 (!) 125/57 (!) 108/59  Pulse: 86 87 86 83  Resp: (!) 23 18 (!) 23 18  Temp:   (!) 97.2 F (36.2 C) 98.7 F (37.1 C)  TempSrc:    Oral  SpO2: 99% 97% 96% 96%    Intake/Output Summary (Last 24 hours) at 06/16/2018 1753 Last data filed at 06/16/2018 1717 Gross per  24 hour  Intake 1506.25 ml  Output 575 ml  Net 931.25 ml   There were no vitals filed for this visit.  Exam:  . General: 84 y.o. year-old female well developed well nourished in no acute distress.  Alert and confused. . Cardiovascular:  Regular rate and rhythm with no rubs or gallops.  No thyromegaly or JVD noted.   Marland Kitchen Respiratory: Clear to auscultation with no wheezes or rales. Good inspiratory effort. . Abdomen: Soft nontender nondistended with normal bowel sounds x4 quadrants. . Musculoskeletal: Trace lower extremity edema. 2/4 pulses in all 4 extremities. Marland Kitchen Psychiatry: Mood is appropriate for condition and setting   Data Reviewed: CBC: Recent Labs  Lab 06/15/18 1728  WBC 7.7  NEUTROABS 6.3  HGB 10.3*  HCT 32.9*  MCV 95.1  PLT 409   Basic Metabolic Panel: Recent Labs  Lab 06/15/18 1728  NA 143  K 3.8  CL 106  CO2 26  GLUCOSE 123*  BUN 19  CREATININE 1.14*  CALCIUM 8.4*   GFR: CrCl cannot be calculated (Unknown ideal weight.). Liver Function Tests: Recent Labs  Lab 06/15/18 1728  AST 22  ALT 16  ALKPHOS 50  BILITOT 0.7  PROT 6.2*  ALBUMIN 3.5   No results for input(s): LIPASE, AMYLASE in the last 168 hours. No results for input(s): AMMONIA in the last 168 hours. Coagulation Profile: No results for input(s): INR, PROTIME in the last 168 hours. Cardiac Enzymes: No results for input(s): CKTOTAL, CKMB, CKMBINDEX, TROPONINI in the last 168 hours. BNP (last 3 results) No results for input(s): PROBNP in the last 8760 hours. HbA1C: No results for input(s): HGBA1C in the last 72 hours. CBG: No results for input(s): GLUCAP in the last 168 hours. Lipid Profile: No results for input(s): CHOL, HDL, LDLCALC, TRIG, CHOLHDL, LDLDIRECT in the last 72 hours. Thyroid Function Tests: No results for input(s): TSH, T4TOTAL, FREET4, T3FREE, THYROIDAB in the last 72 hours. Anemia Panel: No results for input(s): VITAMINB12, FOLATE, FERRITIN, TIBC, IRON, RETICCTPCT in the last 72 hours. Urine analysis:    Component Value Date/Time   COLORURINE YELLOW 06/16/2018 [place] 06/16/2018 1248   LABSPEC 1.020 06/16/2018 1248   PHURINE 5.5 06/16/2018 1248   GLUCOSEU NEGATIVE 06/16/2018 1248    GLUCOSEU NEGATIVE 04/17/2018 1455   HGBUR LARGE (A) 06/16/2018 1248   HGBUR 2+ 07/11/2010 0921   BILIRUBINUR NEGATIVE 06/16/2018 1248   BILIRUBINUR 1+ 05/08/2018 1058   KETONESUR NEGATIVE 06/16/2018 1248   PROTEINUR NEGATIVE 06/16/2018 1248   UROBILINOGEN 0.2 05/08/2018 1058   UROBILINOGEN 0.2 04/17/2018 1455   NITRITE NEGATIVE 06/16/2018 1248   LEUKOCYTESUR SMALL (A) 06/16/2018 1248   Sepsis Labs: @LABRCNTIP (procalcitonin:4,lacticidven:4)  ) Recent Results (from the past 240 hour(s))  MRSA PCR Screening     Status: None   Collection Time: 06/16/18  9:00 AM  Result Value Ref Range Status   MRSA by PCR NEGATIVE NEGATIVE Final    Comment:        The GeneXpert MRSA Assay (FDA approved for NASAL specimens only), is one component of a comprehensive MRSA colonization surveillance program. It is not intended to diagnose MRSA infection nor to guide or monitor treatment for MRSA infections. Performed at [facility], [place] [address], [place], [place] [address]       Studies: Ct Abdomen Pelvis Wo Contrast  Result Date: 06/16/2018 CLINICAL DATA:  84 year old female with recurrent UTI. Initial encounter. EXAM: CT ABDOMEN AND PELVIS WITHOUT CONTRAST TECHNIQUE: Multidetector CT imaging  of the abdomen and pelvis was performed following the standard protocol without IV contrast. COMPARISON:  No comparison CT abdomen or pelvis. Comparison chest CT 09/28/2012. FINDINGS: Lower chest: Bibasilar atelectasis with minimal pleural effusion/pleural thickening. Cardiomegaly. Aortic valve calcification. Hepatobiliary: Taking into account limitation by non contrast imaging, no worrisome hepatic lesion. Right lobe liver small calcifications. No calcified gallstone or common bile duct stone. Limited for evaluating for gallbladder inflammation. Pancreas: Taking into account limitation by non contrast imaging, no worrisome pancreatic mass. Limited for evaluating for inflammation. Spleen: No splenic mass  or enlargement. Adrenals/Urinary Tract: No obstructing stone or hydronephrosis. Left lower pole 1.7 cm nonobstructing stone. Taking into account limitation by non contrast imaging, no worrisome renal lesion. Hyperplasia adrenal glands. Foley catheter in place with decompressed urinary bladder. Stomach/Bowel: Evaluation for the possibility of extraluminal bowel inflammatory process is limited by lack of fat planes and third spacing of fluid. Diverticulosis throughout the colon. Fluid and secretion filled dilated esophagus. Vascular/Lymphatic: Atherosclerotic changes aorta and aortic branch vessels. No abdominal aortic aneurysm. Soft tissue surrounds the abdominal aorta. This may be partially explained by unopacified bowel but can not exclude retroperitoneal fibrosis or adenopathy. Reproductive: No obvious mass identified. Other: No free intraperitoneal air. Third spacing of fluid. No bowel containing hernia. Musculoskeletal: Anterior slip L3, L4 and L5 most notable L4 level (10 mm) secondary to facet degenerative changes. Disc space narrowing at each of these levels. No compression fracture. Degenerative changes lower thoracic spine. Question 9 cm mass or hematoma anterior left thigh. Bilateral hip joint degenerative changes. Bones appear osteopenic. No obvious fracture. IMPRESSION: 1. No obstructing stone or hydronephrosis. Left lower pole 1.7 cm nonobstructing stone. 2. Evaluation for the possibility of extraluminal bowel inflammatory process is limited by lack of fat planes and third spacing of fluid. Diverticulosis throughout the colon. 3. Fluid and secretion filled dilated esophagus. 4. Question 9 cm mass or hematoma anterior left thigh. 5. Aortic Atherosclerosis (ICD10-I70.0). No abdominal aortic aneurysm. Soft tissue surrounds the abdominal aorta. This may be partially explained by unopacified bowel but can not exclude retroperitoneal fibrosis or adenopathy. 6. Degenerative changes thoracic spine, lumbar spine  and both hips. Electronically Signed   By: Genia Del M.D.   On: 06/16/2018 14:00   Dg Chest 2 View  Result Date: 01-20-202019 CLINICAL DATA:  Left femur fracture.  Pre-op respiratory exam EXAM: CHEST - 2 VIEW COMPARISON:  01/25/2017 and CT on 09/28/2012 FINDINGS: Stable mild cardiomegaly. Aortic atherosclerosis. Right paratracheal mass is again seen, consistent with dilated thoracic esophagus as demonstrated on previous CT. No evidence of pulmonary infiltrate or edema. No significant pleural effusion. Surgical clips again seen in left axilla. IMPRESSION: No active lung disease. Stable right paratracheal mass, consistent with dilated thoracic esophagus. Stable mild cardiomegaly. Electronically Signed   By: Earle Gell M.D.   On: 01-20-202019 18:28   Dg Knee 1-2 Views Left  Result Date: 01-20-202019 CLINICAL DATA:  Left femur fracture. EXAM: LEFT KNEE - 1-2 VIEW COMPARISON:  02/27/2018 FINDINGS: Bones are diffusely demineralized. Comminuted fracture of the distal femoral metaphysis evident although assessment limited by patient positioning. No definite evidence of fracture extension to the weightbearing articular surface. There is advanced tricompartmental degenerative change in the knee. IMPRESSION: Comminuted fracture of the distal femoral metaphysis without definite extension to the weight-bearing articular surface of the femoral condyles. Osteopenia. Electronically Signed   By: Misty Stanley M.D.   On: 01-20-202019 18:25   Ct Knee Right Wo Contrast  Result Date: 06/16/2018 CLINICAL DATA:  Knee pain post falling. EXAM: CT OF THE RIGHT KNEE WITHOUT CONTRAST TECHNIQUE: Multidetector CT imaging of the right knee was performed according to the standard protocol. Multiplanar CT image reconstructions were also generated. COMPARISON:  Radiographs 03-31-2018 and 01/25/2017. FINDINGS: Bones/Joint/Cartilage The bones are diffusely demineralized. There is no evidence of acute fracture or dislocation. There are  advanced tricompartmental degenerative changes with fragmented osteophytes, especially medially. There is meniscal chondrocalcinosis, a moderate-sized joint effusion and multiple small intra-articular loose bodies. Ligaments Suboptimally assessed by CT. Muscles and Tendons No focal muscular abnormalities are identified. The extensor mechanism is intact. Soft tissues There is a complex, septated Baker's cyst measuring up to 3.2 x 1.7 cm transverse and extending 6.5 cm in length. There are small calcifications within this Baker's cyst. There is prominent atherosclerosis within the popliteal and lower leg arteries. No acute vascular findings identified on noncontrast imaging. IMPRESSION: 1. No evidence of acute fracture or dislocation. 2. Advanced tricompartmental degenerative changes with fragmented osteophytes, loose bodies and a moderate size joint effusion. 3. Complex Baker's cyst. Electronically Signed   By: Richardean Sale M.D.   On: 06/16/2018 13:49   Dg Knee Complete 4 Views Right  Result Date: 03-31-2018 CLINICAL DATA:  Fall EXAM: RIGHT KNEE - COMPLETE 4+ VIEW COMPARISON:  01/25/2017 FINDINGS: No fracture or malalignment. Severe tricompartment arthritis with joint space narrowing, subarticular sclerosis and osteophyte formation. Small moderate knee effusion. IMPRESSION: 1. No acute osseous abnormality. 2. Advanced arthritis of the knee with small moderate knee effusion Electronically Signed   By: Donavan Foil M.D.   On: 03-31-2018 18:26   Dg C-arm 1-60 Min  Result Date: 06/16/2018 CLINICAL DATA:  ORIF of the left femur. EXAM: DG C-ARM 61-120 MIN; LEFT FEMUR 2 VIEWS COMPARISON:  Left knee radiographs 03-31-2018 FLUOROSCOPY TIME:  Fluoroscopy Time:  2 minutes 27 seconds Number of Acquired Spot Images: 5 FINDINGS: A reverse IM nail is in place. For transverse screws are present at the level of fracture. The fracture is reduced. Knee is located. Two proximal interlocking screws are present. There is  slight displacement of the cortex at the tip of the more distal of the 2 proximal interlocking screws. IMPRESSION: 1. Status post ORIF with reverse IM nail through the distal femur. 2. Near anatomic reduction. Electronically Signed   By: San Morelle M.D.   On: 06/16/2018 16:33   Dg Femur Min 2 Views Left  Result Date: 06/16/2018 CLINICAL DATA:  ORIF of the left femur. EXAM: DG C-ARM 61-120 MIN; LEFT FEMUR 2 VIEWS COMPARISON:  Left knee radiographs 03-31-2018 FLUOROSCOPY TIME:  Fluoroscopy Time:  2 minutes 27 seconds Number of Acquired Spot Images: 5 FINDINGS: A reverse IM nail is in place. For transverse screws are present at the level of fracture. The fracture is reduced. Knee is located. Two proximal interlocking screws are present. There is slight displacement of the cortex at the tip of the more distal of the 2 proximal interlocking screws. IMPRESSION: 1. Status post ORIF with reverse IM nail through the distal femur. 2. Near anatomic reduction. Electronically Signed   By: San Morelle M.D.   On: 06/16/2018 16:33   Dg Femur Min 2 Views Left  Result Date: 03-31-2018 CLINICAL DATA:  Fall, history of fracture EXAM: LEFT FEMUR 2 VIEWS COMPARISON:  02/27/2018, 03-31-2018 FINDINGS: Left femoral head projects in joint. Moderate inferior degenerative changes at the glenohumeral interval. Acute mildly comminuted distal femoral fracture involving the metaphysis. 1/2 bone with posterior displacement and lateral displacement of distal fracture  fragment. No knee dislocation. Advanced arthritis at the knee. Vascular calcifications. IMPRESSION: Acute mildly comminuted and displaced distal femoral fracture. Electronically Signed   By: Donavan Foil M.D.   On: 01/24/2018 18:32    Scheduled Meds: . sodium chloride   Intravenous Once  . acetaminophen  500 mg Oral Q6H  . docusate sodium  100 mg Oral BID  . [START ON 06/17/2018] enoxaparin (LOVENOX) injection  40 mg Subcutaneous Q24H  . feeding  supplement (ENSURE ENLIVE)  237 mL Oral BID BM  . latanoprost  1 drop Both Eyes QHS  . pantoprazole  40 mg Oral Daily  . potassium chloride  10 mEq Oral Daily    Continuous Infusions: . sodium chloride 75 mL/hr at 06/15/18 2235  . sodium chloride    .  ceFAZolin (ANCEF) IV    . lactated ringers 10 mL/hr at 06/16/18 1244  . methocarbamol (ROBAXIN) IV       LOS: 1 day     Kayleen Memos, MD [facility] Pager [phone]  If 7PM-7AM, please contact night-coverage [email or web address] Password [facility] 06/16/2018, 5:53 PM

## 2018-06-16 NOTE — Anesthesia Procedure Notes (Signed)
Procedure Name: Intubation Date/Time: 06/16/2018 2:39 PM Performed by: Orlie Dakin, CRNA Pre-anesthesia Checklist: Patient identified, Emergency Drugs available, Suction available and Patient being monitored Patient Re-evaluated:Patient Re-evaluated prior to induction Oxygen Delivery Method: Circle system utilized Preoxygenation: Pre-oxygenation with 100% oxygen Induction Type: IV induction Ventilation: Mask ventilation without difficulty and Oral airway inserted - appropriate to patient size Laryngoscope Size: Sabra Heck and 3 Grade View: Grade I Tube type: Oral Tube size: 7.0 mm Number of attempts: 1 Airway Equipment and Method: Stylet Placement Confirmation: ETT inserted through vocal cords under direct vision,  positive ETCO2 and breath sounds checked- equal and bilateral Secured at: 21 cm Tube secured with: Tape Dental Injury: Teeth and Oropharynx as per pre-operative assessment

## 2018-06-16 NOTE — Transfer of Care (Signed)
Immediate Anesthesia Transfer of Care Note  Patient: Veronica Phillips  Procedure(s) Performed: OPEN REDUCTION INTERNAL FIXATION (ORIF) DISTAL FEMUR FRACTURE (Left Leg Upper)  Patient Location: PACU  Anesthesia Type:General  Level of Consciousness: drowsy  Airway & Oxygen Therapy: Patient Spontanous Breathing and Patient connected to face mask oxygen  Post-op Assessment: Report given to RN and Post -op Vital signs reviewed and stable  Post vital signs: Reviewed and stable  Last Vitals:  Vitals Value Taken Time  BP 148/67 06/16/2018  4:30 PM  Temp    Pulse 83 06/16/2018  4:31 PM  Resp 23 06/16/2018  4:31 PM  SpO2 100 % 06/16/2018  4:31 PM  Vitals shown include unvalidated device data.  Last Pain:  Vitals:   06/16/18 1007  TempSrc:   PainSc: 6          Complications: No apparent anesthesia complications

## 2018-06-16 NOTE — Consult Note (Signed)
ORTHOPAEDIC CONSULTATION  REQUESTING PHYSICIAN: Kayleen Memos, DO  Chief Complaint: Left supracondylar femur fracture  HPI: Veronica Phillips is a 84 y.o. female who presents with left supracondylar femur fracture.  She is a household ambulator with walker.  Patient fell out of the front door and was found by her daughter.  She has mild dementia and AMS and is therefore unable to provide history.   Past Medical History:  Diagnosis Date  . Arthritis    "knees, shoulders" (12/29/2015)  . Atrophic gastritis   . Cancer of left breast ([place]) 1989  . CKD (chronic kidney disease), stage III ([place])   . Diverticulosis of colon   . GERD (gastroesophageal reflux disease)   . Glaucoma   . Headache    "awful headaches when I was coming up in my teens"  . Hypertension   . Lactose intolerance   . Pernicious anemia   . Vitamin B 12 deficiency    Past Surgical History:  Procedure Laterality Date  . BALLOON DILATION N/A 06/28/2014   Procedure: BALLOON DILATION;  Surgeon: Ladene Artist, MD;  Location: [facility];  Service: Endoscopy;  Laterality: N/A;  . BOTOX INJECTION N/A 07/09/2013   Procedure: BOTOX INJECTION;  Surgeon: Ladene Artist, MD;  Location: [facility];  Service: Endoscopy;  Laterality: N/A;  . BOTOX INJECTION N/A 06/28/2014   Procedure: BOTOX INJECTION;  Surgeon: Ladene Artist, MD;  Location: [facility];  Service: Endoscopy;  Laterality: N/A;  . CATARACT EXTRACTION W/ INTRAOCULAR LENS  IMPLANT, BILATERAL Bilateral 08-2015-10/2015  . ESOPHAGEAL MANOMETRY N/A 10/27/2012   Procedure: ESOPHAGEAL MANOMETRY (EM);  Surgeon: Ladene Artist, MD;  Location: [facility];  Service: Endoscopy;  Laterality: N/A;  . ESOPHAGOGASTRODUODENOSCOPY N/A 07/09/2013   Procedure: ESOPHAGOGASTRODUODENOSCOPY (EGD);  Surgeon: Ladene Artist, MD;  Location: [facility];  Service: Endoscopy;  Laterality: N/A;  . ESOPHAGOGASTRODUODENOSCOPY N/A 10/07/2013   Procedure: ESOPHAGOGASTRODUODENOSCOPY (EGD);  Surgeon:  Ladene Artist, MD;  Location: [facility];  Service: Endoscopy;  Laterality: N/A;  wtih botox  . ESOPHAGOGASTRODUODENOSCOPY (EGD) WITH PROPOFOL N/A 06/28/2014   Procedure: ESOPHAGOGASTRODUODENOSCOPY (EGD) WITH PROPOFOL;  Surgeon: Ladene Artist, MD;  Location: [facility];  Service: Endoscopy;  Laterality: N/A;  . MASTECTOMY Left 1989   Social History   Socioeconomic History  . Marital status: Widowed    Spouse name: Not on file  . Number of children: Not on file  . Years of education: Not on file  . Highest education level: Not on file  Occupational History  . Not on file  Social Needs  . Financial resource strain: Not on file  . Food insecurity:    Worry: Not on file    Inability: Not on file  . Transportation needs:    Medical: Not on file    Non-medical: Not on file  Tobacco Use  . Smoking status: Never Smoker  . Smokeless tobacco: Never Used  Substance and Sexual Activity  . Alcohol use: No  . Drug use: No  . Sexual activity: Not Currently  Lifestyle  . Physical activity:    Days per week: Not on file    Minutes per session: Not on file  . Stress: Not on file  Relationships  . Social connections:    Talks on phone: Not on file    Gets together: Not on file    Attends religious service: Not on file    Active member of club or organization: Not on file    Attends  meetings of clubs or organizations: Not on file    Relationship status: Not on file  Other Topics Concern  . 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 Hypertension Mother        family hx  . Lung cancer Father        family hx  . Stroke Other        family hx  . Heart disease Sister        family hx   - negative except otherwise stated in the family history section Allergies  Allergen Reactions  . Septra [Bactrim] Swelling    Site of swelling not recalled by the patient  . Sulfa Antibiotics Swelling     Site of swelling not recalled by the patient  . Tape Other (See Comments)    Skin is sensitive; please use paper tape!!   Prior to Admission medications   Medication Sig Start Date End Date Taking? Authorizing Provider  Ascorbic Acid (VITAMIN C PO) Take 1 tablet by mouth at bedtime.    Yes [provider]  BIOTIN PO Take 1 tablet by mouth every evening.   Yes [provider]  ciprofloxacin (CIPRO) 250 MG tablet Take 0.5 tablets (125 mg total) by mouth daily. 05/12/18 08/10/18 Yes Nafziger, [name], NP  Cyanocobalamin (VITAMIN B-12 PO) Take 1 tablet by mouth at bedtime.    Yes [provider]  ENSURE PLUS (ENSURE PLUS) LIQD Take 237 mLs by mouth daily.   Yes [provider]  ferrous gluconate (FERGON) 325 MG tablet Take 325 mg by mouth daily after supper.    Yes [provider]  latanoprost (XALATAN) 0.005 % ophthalmic solution Place 1 drop into both eyes at bedtime.  12/03/14  Yes [provider]  Multiple Vitamins-Minerals (ONE-A-DAY WOMENS 50 PLUS PO) Take 1 tablet by mouth daily after supper.    Yes [provider]  multivitamin-lutein (OCUVITE-LUTEIN) CAPS capsule Take 1 capsule by mouth daily.   Yes [provider]  omeprazole (PRILOSEC) 20 MG capsule Take 1 capsule (20 mg total) by mouth daily. 05/21/18  Yes Nafziger, [name], NP  potassium chloride (K-DUR) 10 MEQ tablet Take 1 tablet (10 mEq total) by mouth daily. 05/21/18  Yes Nafziger, [name], NP  vitamin E 400 UNIT capsule Take 400 Units by mouth at bedtime.    Yes [provider]  acetaminophen (TYLENOL) 500 MG tablet Take 500 mg by mouth every 6 (six) hours as needed (for pain).     [provider]  fluticasone (FLONASE) 50 MCG/ACT nasal spray Place 2 sprays into both nostrils daily. Patient not taking: Reported on 02/16/2018 11/23/16   Dorothyann Peng, NP  Nutritional Supplements (FEEDING SUPPLEMENT, BOOST BREEZE,) LIQD Take 1 Can by mouth 3 (three) times  daily. Patient not taking: Reported on 02/16/2018 03/13/18   Bonnell Public, MD   Ct Abdomen Pelvis Wo Contrast  Result Date: 06/16/2018 CLINICAL DATA:  84 year old female with recurrent UTI. Initial encounter. EXAM: CT ABDOMEN AND PELVIS WITHOUT CONTRAST TECHNIQUE: Multidetector CT imaging of the abdomen and pelvis was performed following the standard protocol without IV contrast. COMPARISON:  No comparison CT abdomen or pelvis. Comparison chest CT 09/28/2012. FINDINGS: Lower chest: Bibasilar atelectasis with minimal pleural effusion/pleural thickening. Cardiomegaly. Aortic valve calcification. Hepatobiliary: Taking into account limitation by non contrast imaging, no worrisome hepatic lesion. Right lobe liver small  calcifications. No calcified gallstone or common bile duct stone. Limited for evaluating for gallbladder inflammation. Pancreas: Taking into account limitation by non contrast imaging, no worrisome pancreatic mass. Limited for evaluating for inflammation. Spleen: No splenic mass or enlargement. Adrenals/Urinary Tract: No obstructing stone or hydronephrosis. Left lower pole 1.7 cm nonobstructing stone. Taking into account limitation by non contrast imaging, no worrisome renal lesion. Hyperplasia adrenal glands. Foley catheter in place with decompressed urinary bladder. Stomach/Bowel: Evaluation for the possibility of extraluminal bowel inflammatory process is limited by lack of fat planes and third spacing of fluid. Diverticulosis throughout the colon. Fluid and secretion filled dilated esophagus. Vascular/Lymphatic: Atherosclerotic changes aorta and aortic branch vessels. No abdominal aortic aneurysm. Soft tissue surrounds the abdominal aorta. This may be partially explained by unopacified bowel but can not exclude retroperitoneal fibrosis or adenopathy. Reproductive: No obvious mass identified. Other: No free intraperitoneal air. Third spacing of fluid. No bowel containing hernia.  Musculoskeletal: Anterior slip L3, L4 and L5 most notable L4 level (10 mm) secondary to facet degenerative changes. Disc space narrowing at each of these levels. No compression fracture. Degenerative changes lower thoracic spine. Question 9 cm mass or hematoma anterior left thigh. Bilateral hip joint degenerative changes. Bones appear osteopenic. No obvious fracture. IMPRESSION: 1. No obstructing stone or hydronephrosis. Left lower pole 1.7 cm nonobstructing stone. 2. Evaluation for the possibility of extraluminal bowel inflammatory process is limited by lack of fat planes and third spacing of fluid. Diverticulosis throughout the colon. 3. Fluid and secretion filled dilated esophagus. 4. Question 9 cm mass or hematoma anterior left thigh. 5. Aortic Atherosclerosis (ICD10-I70.0). No abdominal aortic aneurysm. Soft tissue surrounds the abdominal aorta. This may be partially explained by unopacified bowel but can not exclude retroperitoneal fibrosis or adenopathy. 6. Degenerative changes thoracic spine, lumbar spine and both hips. Electronically Signed   By: Genia Del M.D.   On: 06/16/2018 14:00   Dg Chest 2 View  Result Date: 25-Oct-202019 CLINICAL DATA:  Left femur fracture.  Pre-op respiratory exam EXAM: CHEST - 2 VIEW COMPARISON:  01/25/2017 and CT on 09/28/2012 FINDINGS: Stable mild cardiomegaly. Aortic atherosclerosis. Right paratracheal mass is again seen, consistent with dilated thoracic esophagus as demonstrated on previous CT. No evidence of pulmonary infiltrate or edema. No significant pleural effusion. Surgical clips again seen in left axilla. IMPRESSION: No active lung disease. Stable right paratracheal mass, consistent with dilated thoracic esophagus. Stable mild cardiomegaly. Electronically Signed   By: Earle Gell M.D.   On: 25-Oct-202019 18:28   Dg Knee 1-2 Views Left  Result Date: 25-Oct-202019 CLINICAL DATA:  Left femur fracture. EXAM: LEFT KNEE - 1-2 VIEW COMPARISON:  02/27/2018 FINDINGS: Bones  are diffusely demineralized. Comminuted fracture of the distal femoral metaphysis evident although assessment limited by patient positioning. No definite evidence of fracture extension to the weightbearing articular surface. There is advanced tricompartmental degenerative change in the knee. IMPRESSION: Comminuted fracture of the distal femoral metaphysis without definite extension to the weight-bearing articular surface of the femoral condyles. Osteopenia. Electronically Signed   By: Misty Stanley M.D.   On: 25-Oct-202019 18:25   Ct Knee Right Wo Contrast  Result Date: 06/16/2018 CLINICAL DATA:  Knee pain post falling. EXAM: CT OF THE RIGHT KNEE WITHOUT CONTRAST TECHNIQUE: Multidetector CT imaging of the right knee was performed according to the standard protocol. Multiplanar CT image reconstructions were also generated. COMPARISON:  Radiographs 25-Oct-202019 and 01/25/2017. FINDINGS: Bones/Joint/Cartilage The bones are diffusely demineralized. There is no evidence of acute fracture or dislocation.  There are advanced tricompartmental degenerative changes with fragmented osteophytes, especially medially. There is meniscal chondrocalcinosis, a moderate-sized joint effusion and multiple small intra-articular loose bodies. Ligaments Suboptimally assessed by CT. Muscles and Tendons No focal muscular abnormalities are identified. The extensor mechanism is intact. Soft tissues There is a complex, septated Baker's cyst measuring up to 3.2 x 1.7 cm transverse and extending 6.5 cm in length. There are small calcifications within this Baker's cyst. There is prominent atherosclerosis within the popliteal and lower leg arteries. No acute vascular findings identified on noncontrast imaging. IMPRESSION: 1. No evidence of acute fracture or dislocation. 2. Advanced tricompartmental degenerative changes with fragmented osteophytes, loose bodies and a moderate size joint effusion. 3. Complex Baker's cyst. Electronically Signed   By:  Richardean Sale M.D.   On: 06/16/2018 13:49   Dg Knee Complete 4 Views Right  Result Date: 2020-12-1417 CLINICAL DATA:  Fall EXAM: RIGHT KNEE - COMPLETE 4+ VIEW COMPARISON:  01/25/2017 FINDINGS: No fracture or malalignment. Severe tricompartment arthritis with joint space narrowing, subarticular sclerosis and osteophyte formation. Small moderate knee effusion. IMPRESSION: 1. No acute osseous abnormality. 2. Advanced arthritis of the knee with small moderate knee effusion Electronically Signed   By: Donavan Foil M.D.   On: 2020-12-1417 18:26   Dg Femur Min 2 Views Left  Result Date: 2020-12-1417 CLINICAL DATA:  Fall, history of fracture EXAM: LEFT FEMUR 2 VIEWS COMPARISON:  02/27/2018, 2020-12-1417 FINDINGS: Left femoral head projects in joint. Moderate inferior degenerative changes at the glenohumeral interval. Acute mildly comminuted distal femoral fracture involving the metaphysis. 1/2 bone with posterior displacement and lateral displacement of distal fracture fragment. No knee dislocation. Advanced arthritis at the knee. Vascular calcifications. IMPRESSION: Acute mildly comminuted and displaced distal femoral fracture. Electronically Signed   By: Donavan Foil M.D.   On: 2020-12-1417 18:32   - pertinent xrays, CT, MRI studies were reviewed and independently interpreted  Positive ROS: All other systems have been reviewed and were otherwise negative with the exception of those mentioned in the HPI and as above.  Physical Exam: General: No acute distress Cardiovascular: No pedal edema Respiratory: No cyanosis, no use of accessory musculature GI: No organomegaly, abdomen is soft and non-tender Skin: No lesions in the area of chief complaint Neurologic: Sensation intact distally Psychiatric: Patient has mild dementia Lymphatic: No axillary or cervical lymphadenopathy  MUSCULOSKELETAL:  - moderate swelling of LLE - strong distal pulses - compartments soft  Assessment: Left supracondylar femur  fracture  Plan: - I have spoken with family and they are comfortable with me doing the surgery today instead of with Dr. Ninfa Linden tomorrow. - discussed r/b/a to surgery and informed consent obtained  Thank you for the consult and the opportunity to see Ms. Kathrin Greathouse, MD [place] 2:27 PM

## 2018-06-16 NOTE — Op Note (Signed)
   Date of Surgery: 06/16/2018  INDICATIONS: Veronica Phillips is a 84 y.o.-year-old female with a left supracondylar femur fracture;  The patient did consent to the procedure after discussion of the risks and benefits.  PREOPERATIVE DIAGNOSIS: Left supracondylar femur fracture  POSTOPERATIVE DIAGNOSIS: Same.  PROCEDURE: Retrograde intramedullary fixation of left supracondylar femur fracture without intracondylar extension  SURGEON: N. Eduard Roux, M.D.  ASSIST: Veronica Phillips, Veronica Phillips; necessary for the timely completion of procedure and due to complexity of procedure.  ANESTHESIA:  general  IV FLUIDS AND URINE: See anesthesia.  ESTIMATED BLOOD LOSS: 200 mL.  IMPLANTS: Biomet 13.5 x 36  DRAINS: none  COMPLICATIONS: None.  DESCRIPTION OF PROCEDURE: The patient was brought to the operating room and placed supine on the operating table.  The patient had been signed prior to the procedure and this was documented. The patient had the anesthesia placed by the anesthesiologist.  A time-out was performed to confirm that this was the correct patient, site, side and location. The patient did receive antibiotics prior to the incision and was re-dosed during the procedure as needed at indicated intervals.  The patient had the operative extremity prepped and draped in the standard surgical fashion.    I made an incision directly over the patellar tendon.  Dissection was carried down through the subcutaneous tissue.  The peritenon was elevated off of the patella tendon.  The patella tendon was then split in line with its fibers sharply.  The infrapatellar fat pad was then removed.  A guidepin was then placed in the intercondylar notch at the appropriate position using fluoroscopic guidance.  The pin was then advanced up the femoral canal with the fracture reduced.  An opening reamer was then advanced over the guidepin to gain entry into the femoral canal.  This was then removed.  With the fracture reduced  the guidewire was then advanced up the femoral canal to the appropriate depth using fluoroscopic guidance.  The femoral nail was then measured to be 36 cm.  Sequential reaming was then performed up to 15.5 mm with adequate chatter.  The nail was then placed over the guidewire to the appropriate depth using fluoroscopy.  I then placed 4 distal interlocking screws through the jig.  The locking bolt was then tightened to lock the construct.  I then placed 2 proximal interlocking screws using the perfect circle technique.  Final x-rays were taken.  The wounds were then thoroughly irrigated and closed in layered fashion using 0 Vicryl for the peritenon, 2-0 Vicryl for the subcuticular layer, staples for the skin.  Sterile dressings were applied.  She was then placed in a knee immobilizer.  Patient tolerated procedure well had no immediate complications.  POSTOPERATIVE PLAN: Patient may begin knee range of motion as tolerated.  She is to remain nonweightbearing to the left lower extremity.  Azucena Cecil, MD [facility] 4:00 PM

## 2018-06-17 ENCOUNTER — Inpatient Hospital Stay (HOSPITAL_COMMUNITY): Payer: Medicare Other

## 2018-06-17 ENCOUNTER — Encounter (HOSPITAL_COMMUNITY): Payer: Self-pay | Admitting: Orthopaedic Surgery

## 2018-06-17 DIAGNOSIS — D62 Acute posthemorrhagic anemia: Secondary | ICD-10-CM

## 2018-06-17 LAB — BASIC METABOLIC PANEL
Anion gap: 11 (ref 5–15)
BUN: 25 mg/dL — ABNORMAL HIGH (ref 8–23)
CALCIUM: 7.3 mg/dL — AB (ref 8.9–10.3)
CO2: 22 mmol/L (ref 22–32)
Chloride: 106 mmol/L (ref 98–111)
Creatinine, Ser: 1.43 mg/dL — ABNORMAL HIGH (ref 0.44–1.00)
GFR calc non Af Amer: 31 mL/min — ABNORMAL LOW (ref >60)
GFR, EST AFRICAN AMERICAN: 36 mL/min — AB (ref >60)
Glucose, Bld: 173 mg/dL — ABNORMAL HIGH (ref 70–99)
Potassium: 3.8 mmol/L (ref 3.5–5.1)
Sodium: 139 mmol/L (ref 135–145)

## 2018-06-17 LAB — CBC
HCT: 18 % — ABNORMAL LOW (ref 36.0–46.0)
Hemoglobin: 5.7 g/dL — CL (ref 12.0–15.0)
MCH: 29.2 pg (ref 26.0–34.0)
MCHC: 31.7 g/dL (ref 30.0–36.0)
MCV: 92.3 fL (ref 80.0–100.0)
PLATELETS: 159 10*3/uL (ref 150–400)
RBC: 1.95 MIL/uL — ABNORMAL LOW (ref 3.87–5.11)
RDW: 14.9 % (ref 11.5–15.5)
WBC: 10.7 10*3/uL — ABNORMAL HIGH (ref 4.0–10.5)
nRBC: 0 % (ref 0.0–0.2)

## 2018-06-17 LAB — URINE CULTURE: Culture: NO GROWTH

## 2018-06-17 LAB — PREPARE RBC (CROSSMATCH)

## 2018-06-17 LAB — HEMOGLOBIN AND HEMATOCRIT, BLOOD
HCT: 27.6 % — ABNORMAL LOW (ref 36.0–46.0)
HEMOGLOBIN: 9.1 g/dL — AB (ref 12.0–15.0)

## 2018-06-17 MED ORDER — DOCUSATE SODIUM 50 MG/5ML PO LIQD
100.0000 mg | Freq: Two times a day (BID) | ORAL | Status: DC
  Administered 2018-06-17 – 2018-06-23 (×6): 100 mg via ORAL
  Filled 2018-06-17 (×12): qty 10

## 2018-06-17 MED ORDER — BOOST PLUS PO LIQD
237.0000 mL | Freq: Three times a day (TID) | ORAL | Status: DC
  Administered 2018-06-18 – 2018-06-23 (×14): 237 mL via ORAL
  Filled 2018-06-17 (×18): qty 237

## 2018-06-17 MED ORDER — SODIUM CHLORIDE 0.9% IV SOLUTION
Freq: Once | INTRAVENOUS | Status: AC
  Administered 2018-06-17: 07:00:00 via INTRAVENOUS

## 2018-06-17 MED ORDER — WHITE PETROLATUM EX OINT
TOPICAL_OINTMENT | CUTANEOUS | Status: AC
  Administered 2018-06-18: 03:00:00
  Filled 2018-06-17: qty 28.35

## 2018-06-17 NOTE — Plan of Care (Signed)
  Problem: Safety: Goal: Ability to remain free from injury will improve Outcome: Progressing   Problem: Skin Integrity: Goal: Risk for impaired skin integrity will decrease Outcome: Progressing   Problem: Activity: Goal: Ability to ambulate and perform ADLs will improve Outcome: Progressing   Problem: Pain Management: Goal: Pain level will decrease Outcome: Progressing   Problem: Nutrition: Goal: Adequate nutrition will be maintained Outcome: Progressing

## 2018-06-17 NOTE — Progress Notes (Signed)
OT Cancellation Note  Patient Details Name: EMELYNN RANCE MRN: [id number] DOB: [birth year]   Cancelled Treatment:    Reason Eval/Treat Not Completed: Patient at procedure or test/ unavailable(Pt receiving first unit of blood. Hgb 5.7. Will hold and return as schedule allows.)  [place], OTR/L Acute Rehab Pager: [phone] Office: [phone] 06/17/2018, 8:19 AM

## 2018-06-17 NOTE — Evaluation (Signed)
Physical Therapy Evaluation Patient Details Name: Veronica Phillips MRN: [id number] DOB: Jul 01, 1922 Today's Date: 06/17/2018   History of Present Illness  Veronica Phillips is a 84 y.o. female with medical history significant of HTN, CKD stage III, breast cancer, current UTIs with Klebsiella pneumonia, and frequent falls related to orthostatic hypotension; who presents after having a unwitnessed fall at home. Pt sustained a L distal femur fx and underwent IM nail 06/16/18.   Clinical Impression  Pt admitted with above diagnosis. Pt currently with functional limitations due to the deficits listed below (see PT Problem List). Pt requiring max A +2 for bed mobility and transfers. It is not realistic for pt to be able to ambulate keeping NWB status, will be transfer level. Family hesitant about rehab due to bad experience with pt's husband but agree that she needs therapy and that they may not be able to provide what she needs at home right now.  Pt will benefit from skilled PT to increase their independence and safety with mobility to allow discharge to the venue listed below.       Follow Up Recommendations SNF;Supervision for mobility/OOB    Equipment Recommendations  None recommended by PT    Recommendations for Other Services       Precautions / Restrictions Precautions Precautions: Fall Required Braces or Orthoses: Other Brace Other Brace: Brace donned on left leg upon arrival Restrictions Weight Bearing Restrictions: Yes LLE Weight Bearing: Non weight bearing      Mobility  Bed Mobility Overal bed mobility: Needs Assistance Bed Mobility: Supine to Sit     Supine to sit: Max assist;+2 for physical assistance     General bed mobility comments: Assistance for bringing BLEs towards EOB and then elevating trunk.  Transfers Overall transfer level: Needs assistance Equipment used: Rolling walker (2 wheeled);None Transfers: Sit to/from [employer] Sit to Stand: Max  assist;+2 physical assistance   Squat pivot transfers: Total assist;+2 physical assistance     General transfer comment: Max A +2 to elevate from EOB. Physical A to maintain NWB status. Total A to pivot to right to recliner  Ambulation/Gait             General Gait Details: pt will not be able to ambulate and keep NWB status  Stairs            Wheelchair Mobility    Modified Rankin (Stroke Patients Only)       Balance Overall balance assessment: Needs assistance Sitting-balance support: No upper extremity supported;Feet supported Sitting balance-Leahy Scale: Fair   Postural control: Posterior lean Standing balance support: Bilateral upper extremity supported;During functional activity Standing balance-Leahy Scale: Poor Standing balance comment: heavily reliant on UE support                             Pertinent Vitals/Pain Pain Assessment: Faces Faces Pain Scale: Hurts little more Pain Location: left leg Pain Descriptors / Indicators: Operative site guarding;Sore;Grimacing Pain Intervention(s): Limited activity within patient's tolerance;Monitored during session    Home Living Family/patient expects to be discharged to:: Private residence Living Arrangements: Children Available Help at Discharge: Family;Available 24 hours/day Type of Home: House Home Access: Stairs to enter;Ramped entrance Entrance Stairs-Rails: Right;Left;Can reach both Entrance Stairs-Number of Steps: 3 Home Layout: Two level;Able to live on main level with bedroom/bathroom Home Equipment: Gilford Rile - 2 wheels;Cane - single point;Bedside commode;Wheelchair - manual      Prior Function Level of Independence:  Needs assistance   Gait / Transfers Assistance Needed: Alternate between using SPC and RW  ADL's / Homemaking Assistance Needed: Performs ADLs. Daughter performs IADLs        Hand Dominance   Dominant Hand: Right    Extremity/Trunk Assessment   Upper Extremity  Assessment Upper Extremity Assessment: Defer to OT evaluation    Lower Extremity Assessment Lower Extremity Assessment: LLE deficits/detail LLE Deficits / Details: unable to lift LLE, painful with movement LLE: Unable to fully assess due to pain LLE Sensation: WNL LLE Coordination: WNL    Cervical / Trunk Assessment Cervical / Trunk Assessment: Kyphotic  Communication   Communication: HOH  Cognition Arousal/Alertness: Awake/alert Behavior During Therapy: WFL for tasks assessed/performed Overall Cognitive Status: History of cognitive impairments - at baseline                                 General Comments: Baseline dementia      General Comments General comments (skin integrity, edema, etc.): daughter and grandson present    Exercises General Exercises - Lower Extremity Ankle Circles/Pumps: AROM;Both;10 reps;Seated   Assessment/Plan    PT Assessment Patient needs continued PT services  PT Problem List Decreased strength;Decreased activity tolerance;Decreased balance;Decreased mobility;Decreased range of motion;Decreased cognition;Decreased safety awareness;Decreased knowledge of precautions;Decreased knowledge of use of DME;Pain       PT Treatment Interventions DME instruction;Functional mobility training;Therapeutic activities;Therapeutic exercise;Balance training;Patient/family education    PT Goals (Current goals can be found in the Care Plan section)  Acute Rehab PT Goals Patient Stated Goal: "Walk again" PT Goal Formulation: With patient Time For Goal Achievement: 07/01/18 Potential to Achieve Goals: Good    Frequency Min 2X/week   Barriers to discharge        Co-evaluation PT/OT/SLP Co-Evaluation/Treatment: Yes Reason for Co-Treatment: Complexity of the patient's impairments (multi-system involvement);For patient/therapist safety PT goals addressed during session: Mobility/safety with mobility;Balance         AM-PAC PT "6 Clicks"  Mobility  Outcome Measure Help needed turning from your back to your side while in a flat bed without using bedrails?: A Lot Help needed moving from lying on your back to sitting on the side of a flat bed without using bedrails?: A Lot Help needed moving to and from a bed to a chair (including a wheelchair)?: A Lot Help needed standing up from a chair using your arms (e.g., wheelchair or bedside chair)?: A Lot Help needed to walk in hospital room?: Total Help needed climbing 3-5 steps with a railing? : Total 6 Click Score: 10    End of Session Equipment Utilized During Treatment: Gait belt Activity Tolerance: Patient tolerated treatment well Patient left: in chair;with call bell/phone within reach;with family/visitor present;with chair alarm set Nurse Communication: Mobility status PT Visit Diagnosis: Unsteadiness on feet (R26.81);History of falling (Z91.81);Difficulty in walking, not elsewhere classified (R26.2);Pain Pain - Right/Left: Left Pain - part of body: Hip    Time: 1447-1511 PT Time Calculation (min) (ACUTE ONLY): 24 min   Charges:   PT Evaluation $PT Eval Moderate Complexity: Landa  Pager [phone] Office [place] 06/17/2018, 4:12 PM

## 2018-06-17 NOTE — Progress Notes (Signed)
PROGRESS NOTE  Veronica Phillips:[id number] DOB: 14-Oct-1922 DOA: 05/15/2018 PCP: Dorothyann Peng, NP  HPI/Recap of past 24 hours: Veronica Phillips is a 84 y.o. female with medical history significant of HTN, CKD stage III, breast cancer, current UTIs with Klebsiella pneumonia, and frequent falls related to orthostatic hypotension; who presents after having a unwitnessed fall at home.  Patient and family help provide history.  Patient lives at home with daughter and normally ambulates with the use of a walker or in a wheelchair. The patient's daughter had been out the house, and patient reports hearing a noise for which she got up and went to the front door.  Family notes that she normally does not get up without assistance, but has been more confused intermittently.  They relate symptoms to her having frequent UTIs for which she is currently on a 90-day course of ciprofloxacin.  Patient reports that she hit her head on concrete porch.  It is unclear if she lost consciousness, but family believes that she likely lost consciousness.  He makes note that patient haslosing some weight, intermittently complains of some mild shortness of breath, is on a soft diet due to history of achalasia, intermittent nausea, and vomiting.    Today, patient reported expected postop pain especially when she moves.  Denies any other new complaints.  Noted postop drop in hemoglobin.  Assessment/Plan: Principal Problem:   Closed displaced supracondylar fracture of distal end of left femur without intracondylar extension (HCC) Active Problems:   Achalasia   Orthostatic syncope   Fall at home, initial encounter   Recurrent UTI   Acute metabolic encephalopathy   Pressure injury of skin   Left distal femur fracture secondary to fall at home s/p retrograde intramedullary fixation of left supracondylar femur fracture on 06/16/18 Management by Ortho Pain, DVT management by Ortho  Acute metabolic  encephalopathy Resolved CT head unremarkable for any acute intracranial findings Chest x-ray unremarkable  Acute blood loss/iron deficiency anemia Post op, hemoglobin 5.7, transfused 2 PRBC today Monitor hemoglobin Continue home iron supplement  History of recurrent UTI Per her daughter she is on ongoing UTI prophylactic treatment day 25/30 on p.o. ciprofloxacin Urine culture with no growth Discontinue home oral ciprofloxacin  Ambulatory dysfunction status post fall PT to assess when indicated Fall precautions  AKI on CKD 3 Presented with creatinine of 1.41, baseline creatinine of 1.08 and GFR of 50 Avoid nephrotoxic agents/dehydration Monitor urine output Daily BMP  Orthostatic hypotension Blood pressure stable Patient not on any medication to treat for the symptoms  Achalasia and GERD Patient intermittently has difficulty swallowing certain food, and family reports should be on a soft diet. Aspiration precautions Soft diet Continue to pharmacy substitution of Protonix   DVT prophylaxis:  Subcu Lovenox   Code Status: Full code Family Communication:  None at bedside Disposition Plan: TBD  Consults called: ortho      Objective: Vitals:   06/17/18 0837 06/17/18 1027 06/17/18 1059 06/17/18 1441  BP: (!) 111/51 109/79 (!) 120/58 122/68  Pulse: 77 74 81 85  Resp: 16 17 18 18   Temp: (!) 100.8 F (38.2 C) 99.9 F (37.7 C)  99.8 F (37.7 C)  TempSrc: Oral Axillary  Oral  SpO2: 97% 98% 97% 98%    Intake/Output Summary (Last 24 hours) at 06/17/2018 1541 Last data filed at 06/17/2018 1445 Gross per 24 hour  Intake 3360.78 ml  Output 275 ml  Net 3085.78 ml   There were no vitals filed for this  visit.  Exam:  General: NAD, pale  Cardiovascular: S1, S2 present  Respiratory: CTAB  Abdomen: Soft, nontender, nondistended, bowel sounds present  Musculoskeletal: No bilateral pedal edema noted, LLE in immobilizer  Skin: Normal  Psychiatry: Normal  mood    Data Reviewed: CBC: Recent Labs  Lab 06/15/18 1728 06/17/18 0504  WBC 7.7 10.7*  NEUTROABS 6.3  --   HGB 10.3* 5.7*  HCT 32.9* 18.0*  MCV 95.1 92.3  PLT 236 578   Basic Metabolic Panel: Recent Labs  Lab 06/15/18 1728 06/17/18 0317  NA 143 139  K 3.8 3.8  CL 106 106  CO2 26 22  GLUCOSE 123* 173*  BUN 19 25*  CREATININE 1.14* 1.43*  CALCIUM 8.4* 7.3*   GFR: CrCl cannot be calculated (Unknown ideal weight.). Liver Function Tests: Recent Labs  Lab 06/15/18 1728  AST 22  ALT 16  ALKPHOS 50  BILITOT 0.7  PROT 6.2*  ALBUMIN 3.5   No results for input(s): LIPASE, AMYLASE in the last 168 hours. No results for input(s): AMMONIA in the last 168 hours. Coagulation Profile: No results for input(s): INR, PROTIME in the last 168 hours. Cardiac Enzymes: No results for input(s): CKTOTAL, CKMB, CKMBINDEX, TROPONINI in the last 168 hours. BNP (last 3 results) No results for input(s): PROBNP in the last 8760 hours. HbA1C: No results for input(s): HGBA1C in the last 72 hours. CBG: No results for input(s): GLUCAP in the last 168 hours. Lipid Profile: No results for input(s): CHOL, HDL, LDLCALC, TRIG, CHOLHDL, LDLDIRECT in the last 72 hours. Thyroid Function Tests: No results for input(s): TSH, T4TOTAL, FREET4, T3FREE, THYROIDAB in the last 72 hours. Anemia Panel: No results for input(s): VITAMINB12, FOLATE, FERRITIN, TIBC, IRON, RETICCTPCT in the last 72 hours. Urine analysis:    Component Value Date/Time   COLORURINE YELLOW 06/16/2018 1248   APPEARANCEUR CLEAR 06/16/2018 1248   LABSPEC 1.020 06/16/2018 1248   PHURINE 5.5 06/16/2018 1248   GLUCOSEU NEGATIVE 06/16/2018 1248   GLUCOSEU NEGATIVE 04/17/2018 1455   HGBUR LARGE (A) 06/16/2018 1248   HGBUR 2+ 07/11/2010 0921   BILIRUBINUR NEGATIVE 06/16/2018 1248   BILIRUBINUR 1+ 05/08/2018 1058   KETONESUR NEGATIVE 06/16/2018 1248   PROTEINUR NEGATIVE 06/16/2018 1248   UROBILINOGEN 0.2 05/08/2018 1058    UROBILINOGEN 0.2 04/17/2018 1455   NITRITE NEGATIVE 06/16/2018 1248   LEUKOCYTESUR SMALL (A) 06/16/2018 1248   Sepsis Labs: @LABRCNTIP (procalcitonin:4,lacticidven:4)  ) Recent Results (from the past 240 hour(s))  Culture, Urine     Status: None   Collection Time: 06/15/18 11:08 PM  Result Value Ref Range Status   Specimen Description URINE, CATHETERIZED  Final   Special Requests NONE  Final   Culture   Final    NO GROWTH Performed at [facility], [place] [address] [place], [place] [address]    Report Status 06/17/2018 FINAL  Final  MRSA PCR Screening     Status: None   Collection Time: 06/16/18  9:00 AM  Result Value Ref Range Status   MRSA by PCR NEGATIVE NEGATIVE Final    Comment:        The GeneXpert MRSA Assay (FDA approved for NASAL specimens only), is one component of a comprehensive MRSA colonization surveillance program. It is not intended to diagnose MRSA infection nor to guide or monitor treatment for MRSA infections. Performed at [facility], [place] [address] [place], [place] [address]       Studies: Dg C-arm 1-60 Min  Result Date: 06/16/2018  CLINICAL DATA:  ORIF of the left femur. EXAM: DG C-ARM 61-120 MIN; LEFT FEMUR 2 VIEWS COMPARISON:  Left knee radiographs 12/04/2017 FLUOROSCOPY TIME:  Fluoroscopy Time:  2 minutes 27 seconds Number of Acquired Spot Images: 5 FINDINGS: A reverse IM nail is in place. For transverse screws are present at the level of fracture. The fracture is reduced. Knee is located. Two proximal interlocking screws are present. There is slight displacement of the cortex at the tip of the more distal of the 2 proximal interlocking screws. IMPRESSION: 1. Status post ORIF with reverse IM nail through the distal femur. 2. Near anatomic reduction. Electronically Signed   By: San Morelle M.D.   On: 06/16/2018 16:33   Dg Femur Min 2 Views Left  Result Date: 06/16/2018 CLINICAL DATA:  ORIF of the left femur. EXAM: DG C-ARM  61-120 MIN; LEFT FEMUR 2 VIEWS COMPARISON:  Left knee radiographs 12/04/2017 FLUOROSCOPY TIME:  Fluoroscopy Time:  2 minutes 27 seconds Number of Acquired Spot Images: 5 FINDINGS: A reverse IM nail is in place. For transverse screws are present at the level of fracture. The fracture is reduced. Knee is located. Two proximal interlocking screws are present. There is slight displacement of the cortex at the tip of the more distal of the 2 proximal interlocking screws. IMPRESSION: 1. Status post ORIF with reverse IM nail through the distal femur. 2. Near anatomic reduction. Electronically Signed   By: San Morelle M.D.   On: 06/16/2018 16:33    Scheduled Meds: . ciprofloxacin  125 mg Oral Daily  . docusate  100 mg Oral BID  . enoxaparin (LOVENOX) injection  40 mg Subcutaneous Q24H  . feeding supplement (ENSURE ENLIVE)  237 mL Oral BID BM  . latanoprost  1 drop Both Eyes QHS  . pantoprazole  40 mg Oral Daily  . potassium chloride  10 mEq Oral Daily    Continuous Infusions: . sodium chloride 75 mL/hr at 06/15/18 2235  . sodium chloride 75 mL/hr at 06/16/18 1900  . lactated ringers 10 mL/hr at 06/16/18 1244  . methocarbamol (ROBAXIN) IV       LOS: 2 days     Alma Friendly, MD [facility] 06/17/2018, 3:41 PM

## 2018-06-17 NOTE — Evaluation (Signed)
Occupational Therapy Evaluation Patient Details Name: Veronica Phillips MRN: [id number] DOB: Sep 03, 1922 Today's Date: 06/17/2018    History of Present Illness Veronica Phillips is a 84 y.o. female with medical history significant of HTN, CKD stage III, breast cancer, current UTIs with Klebsiella pneumonia, and frequent falls related to orthostatic hypotension; who presents after having a unwitnessed fall at home. Pt sustained a L distal femur fx and underwent IM nail 06/16/18.    Clinical Impression   PTA, pt was living with her daughter and was independent with BADLs and used SPC and RW as she needed. Pt currently requiring Max A +2 for LB ADLs and Max-Total A +2 for functional transfers. Pt with decreased strength, balance, and adherence of WB status. Pt would benefit from further acute OT to facilitate safe dc. Recommend dc to post-acute rehab for further OT to optimize safety, independence with ADLs, and return to PLOF. Pt's family reporting they want want to dc home with HHOT to maintain home environment for cognitive deficits.      Follow Up Recommendations  SNF;Supervision/Assistance - 24 hour;Other (comment)(Family stating they may want to dc home with HHOT to maintain home environment for cognition)    Equipment Recommendations  None recommended by OT    Recommendations for Other Services PT consult     Precautions / Restrictions Precautions Precautions: Fall Required Braces or Orthoses: Other Brace Other Brace: Brace donned on left leg upon arrival Restrictions Weight Bearing Restrictions: Yes LLE Weight Bearing: Non weight bearing      Mobility Bed Mobility Overal bed mobility: Needs Assistance Bed Mobility: Supine to Sit     Supine to sit: Max assist;+2 for physical assistance     General bed mobility comments: Assistance for bringing BLEs towards EOB and then elevating trunk.  Transfers Overall transfer level: Needs assistance Equipment used: Rolling walker (2  wheeled);None Transfers: Sit to/from [employer] Sit to Stand: Max assist;+2 physical assistance   Squat pivot transfers: Total assist;+2 physical assistance     General transfer comment: Max A +2 to elevate from EOB. Physical A to maintain NWB status. Total A to pivot to right to recliner    Balance Overall balance assessment: Needs assistance Sitting-balance support: No upper extremity supported;Feet supported Sitting balance-Leahy Scale: Fair     Standing balance support: Bilateral upper extremity supported;During functional activity Standing balance-Leahy Scale: Poor                             ADL either performed or assessed with clinical judgement   ADL Overall ADL's : Needs assistance/impaired Eating/Feeding: Set up;Supervision/ safety;Sitting   Grooming: Set up;Supervision/safety;Sitting   Upper Body Bathing: Min guard;Sitting   Lower Body Bathing: Maximal assistance;+2 for physical assistance;Sit to/from stand   Upper Body Dressing : Min guard Upper Body Dressing Details (indicate cue type and reason): Pt donning her night gown with Min Guard A for safety Lower Body Dressing: Maximal assistance;Sit to/from stand;+2 for physical assistance   Toilet Transfer: Total assistance;+2 for physical assistance;Squat-pivot(simulated to recliner) Toilet Transfer Details (indicate cue type and reason): Total A to pivot on RLE and maintain NWB status         Functional mobility during ADLs: Maximal assistance;Total assistance;+2 for physical assistance;Rolling walker(squat pivot only) General ADL Comments: Pt with decreased balance and strength. Poor adherance to NWB status in standing and requires physical A     Vision  Perception     Praxis      Pertinent Vitals/Pain Pain Assessment: Faces Faces Pain Scale: Hurts little more Pain Location: left leg Pain Intervention(s): Monitored during session;Limited activity within patient's  tolerance;Repositioned     Hand Dominance Right   Extremity/Trunk Assessment Upper Extremity Assessment Upper Extremity Assessment: Generalized weakness   Lower Extremity Assessment Lower Extremity Assessment: Defer to PT evaluation   Cervical / Trunk Assessment Cervical / Trunk Assessment: Kyphotic   Communication Communication Communication: HOH   Cognition Arousal/Alertness: Awake/alert Behavior During Therapy: WFL for tasks assessed/performed Overall Cognitive Status: History of cognitive impairments - at baseline                                 General Comments: Baseline dementia   General Comments  Daughter and grandson present    Exercises     Shoulder Instructions      Home Living Family/patient expects to be discharged to:: Private residence Living Arrangements: Children(Daughter') Available Help at Discharge: Family;Available 24 hours/day Type of Home: House Home Access: Stairs to enter;Ramped entrance Entrance Stairs-Number of Steps: 3   Home Layout: Two level;Able to live on main level with bedroom/bathroom     Bathroom Shower/Tub: Teacher, early years/pre: Standard     Home Equipment: Environmental consultant - 2 wheels;Cane - single point;Bedside commode;Wheelchair - manual          Prior Functioning/Environment Level of Independence: Needs assistance  Gait / Transfers Assistance Needed: Alternate between using SPC and RW ADL's / Homemaking Assistance Needed: Performs ADLs. Daughter performs IADLs            OT Problem List: Decreased strength;Decreased range of motion;Decreased activity tolerance;Impaired balance (sitting and/or standing);Decreased knowledge of use of DME or AE;Decreased knowledge of precautions;Pain      OT Treatment/Interventions: Self-care/ADL training;Therapeutic exercise;Energy conservation;DME and/or AE instruction;Therapeutic activities;Patient/family education    OT Goals(Current goals can be found in the  care plan section) Acute Rehab OT Goals Patient Stated Goal: "Walk again" OT Goal Formulation: With patient/family Time For Goal Achievement: 07/01/18 Potential to Achieve Goals: Good  OT Frequency: Min 2X/week   Barriers to D/C:            Co-evaluation              AM-PAC OT "6 Clicks" Daily Activity     Outcome Measure Help from another person eating meals?: A Little Help from another person taking care of personal grooming?: A Little Help from another person toileting, which includes using toliet, bedpan, or urinal?: A Lot Help from another person bathing (including washing, rinsing, drying)?: A Lot Help from another person to put on and taking off regular upper body clothing?: A Lot Help from another person to put on and taking off regular lower body clothing?: A Lot 6 Click Score: 14   End of Session Equipment Utilized During Treatment: Gait belt;Rolling walker;Other (comment)(Brace at left leg) Nurse Communication: Mobility status;Precautions;Weight bearing status  Activity Tolerance: Patient tolerated treatment well Patient left: in chair;with call bell/phone within reach;with chair alarm set;with family/visitor present  OT Visit Diagnosis: Unsteadiness on feet (R26.81);Other abnormalities of gait and mobility (R26.89);Muscle weakness (generalized) (M62.81);Other symptoms and signs involving cognitive function;Pain Pain - Right/Left: Left Pain - part of body: Leg                Time: 1447-1511 OT Time Calculation (min): 24 min Charges:  OT General  Charges $OT Visit: 1 Visit OT Evaluation $OT Eval Moderate Complexity: Vincent, OTR/L Acute Rehab Pager: [phone] Office: [place] 06/17/2018, 4:05 PM

## 2018-06-17 NOTE — Progress Notes (Signed)
PT Cancellation Note  Patient Details Name: Veronica Phillips MRN: [id number] DOB: [birth year]   Cancelled Treatment:    Reason Eval/Treat Not Completed: Medical issues which prohibited therapy(hgb 5.7). Will check back later.    [place] 06/17/2018, 8:21 AM

## 2018-06-17 NOTE — Progress Notes (Signed)
Leonie Amacher (daughter) called via telephone and made aware of critical hemoglobin level. Did obtain consent for blood with second nurse witness Claire Shown, RN.

## 2018-06-17 NOTE — Progress Notes (Signed)
   Subjective:  Patient reports pain as moderate.  No events.  Objective:   VITALS:   Vitals:   06/17/18 0717 06/17/18 0741 06/17/18 0741 06/17/18 0837  BP: (!) 115/56  (!) 95/52 (!) 111/51  Pulse: 85 81 81 77  Resp: 16 16 16 16   Temp: 99.4 F (37.4 C) 99 F (37.2 C) 99 F (37.2 C) (!) 100.8 F (38.2 C)  TempSrc: Oral  Axillary Oral  SpO2: 98%  97% 97%    Neurologically intact Neurovascular intact Sensation intact distally Intact pulses distally Dorsiflexion/Plantar flexion intact Incision: dressing C/D/I and no drainage No cellulitis present Compartment soft   Lab Results  Component Value Date   WBC 10.7 (H) 06/17/2018   HGB 5.7 (LL) 06/17/2018   HCT 18.0 (L) 06/17/2018   MCV 92.3 06/17/2018   PLT 159 06/17/2018     Assessment/Plan:  1 Day Post-Op   - Expected postop acute blood loss anemia - pRBCs ordered - Up with PT/OT - DVT ppx - SCDs, ambulation, lovenox - NWB operative extremity - Pain control - Discharge planning - will need SNF  Eduard Roux 06/17/2018, 10:03 AM [phone]

## 2018-06-17 NOTE — Evaluation (Signed)
Clinical/Bedside Swallow Evaluation Patient Details  Name: Veronica Phillips MRN: [id number] Date of Birth: [birth year]  Today's Date: 06/17/2018 Time: SLP Start Time (ACUTE ONLY): 5956 SLP Stop Time (ACUTE ONLY): 1223 SLP Time Calculation (min) (ACUTE ONLY): 19 min  Past Medical History:  Past Medical History:  Diagnosis Date  . Arthritis    "knees, shoulders" (12/29/2015)  . Atrophic gastritis   . Cancer of left breast ([place]) 1989  . CKD (chronic kidney disease), stage III ([place])   . Diverticulosis of colon   . GERD (gastroesophageal reflux disease)   . Glaucoma   . Headache    "awful headaches when I was coming up in my teens"  . Hypertension   . Lactose intolerance   . Pernicious anemia   . Vitamin B 12 deficiency    Past Surgical History:  Past Surgical History:  Procedure Laterality Date  . BALLOON DILATION N/A 06/28/2014   Procedure: BALLOON DILATION;  Surgeon: Ladene Artist, MD;  Location: [facility];  Service: Endoscopy;  Laterality: N/A;  . BOTOX INJECTION N/A 07/09/2013   Procedure: BOTOX INJECTION;  Surgeon: Ladene Artist, MD;  Location: [facility];  Service: Endoscopy;  Laterality: N/A;  . BOTOX INJECTION N/A 06/28/2014   Procedure: BOTOX INJECTION;  Surgeon: Ladene Artist, MD;  Location: [facility];  Service: Endoscopy;  Laterality: N/A;  . CATARACT EXTRACTION W/ INTRAOCULAR LENS  IMPLANT, BILATERAL Bilateral 08-2015-10/2015  . ESOPHAGEAL MANOMETRY N/A 10/27/2012   Procedure: ESOPHAGEAL MANOMETRY (EM);  Surgeon: Ladene Artist, MD;  Location: [facility];  Service: Endoscopy;  Laterality: N/A;  . ESOPHAGOGASTRODUODENOSCOPY N/A 07/09/2013   Procedure: ESOPHAGOGASTRODUODENOSCOPY (EGD);  Surgeon: Ladene Artist, MD;  Location: [facility];  Service: Endoscopy;  Laterality: N/A;  . ESOPHAGOGASTRODUODENOSCOPY N/A 10/07/2013   Procedure: ESOPHAGOGASTRODUODENOSCOPY (EGD);  Surgeon: Ladene Artist, MD;  Location: [facility];  Service: Endoscopy;  Laterality: N/A;  wtih  botox  . ESOPHAGOGASTRODUODENOSCOPY (EGD) WITH PROPOFOL N/A 06/28/2014   Procedure: ESOPHAGOGASTRODUODENOSCOPY (EGD) WITH PROPOFOL;  Surgeon: Ladene Artist, MD;  Location: [facility];  Service: Endoscopy;  Laterality: N/A;  . MASTECTOMY Left 1989  . ORIF FEMUR FRACTURE Left 06/16/2018   Procedure: OPEN REDUCTION INTERNAL FIXATION (ORIF) DISTAL FEMUR FRACTURE;  Surgeon: Leandrew Koyanagi, MD;  Location: [facility];  Service: Orthopedics;  Laterality: Left;   HPI:  ASHA GRUMBINE is a 84 y.o. female with medical history significant of achalasia (see below), GERD, HTN, CKD stage III, breast cancer, current UTIs with Klebsiella pneumonia, and frequent falls related to orthostatic hypotension; who presents after having a unwitnessed fall at home. Per chart pt has lost some weight, intermittently complains of some mild shortness of breath, is on a soft diet due to history of achalasia, intermittent nausea, and vomiting. Found to have closed displaced supracondylar fracture of distal end of left femor and underwent surgery 12/23. CXR No active lung disease. Stable right paratracheal mass, consistent with dilated thoracic esophagus.   Assessment / Plan / Recommendation Clinical Impression  Pt has history of esophageal dilation and achalasia with affirmation of difficulty stating "things come back up sometimes." She is more alert after receiving blood, confused but redirectable and appropriate with grandson at bedside. Pt attempting to drink OJ in mostly supine position. She consumed thin, puree and solid after repositioning without indications of airway compromise and is most at risk post prandial from history of achalasia. Risk discussed with pt and grandson and educated on reflux precautions and importance. Recommend continue softer texture  after mild delays masticating and transiting solid boluses, thin liquids, crush pills, remain upright minimum 30 min after meals if able. No further ST needed at this time.     SLP Visit Diagnosis: Dysphagia, unspecified (R13.10)    Aspiration Risk  Moderate aspiration risk    Diet Recommendation Thin liquid;Other (Comment)(soft texture)   Liquid Administration via: Cup;Straw Medication Administration: Crushed with puree Supervision: Full supervision/cueing for compensatory strategies;Patient able to self feed Compensations: Minimize environmental distractions;Small sips/bites;Slow rate;Lingual sweep for clearance of pocketing Postural Changes: Seated upright at 90 degrees;Remain upright for at least 30 minutes after po intake    Other  Recommendations Oral Care Recommendations: Oral care BID   Follow up Recommendations None      Frequency and Duration            Prognosis        Swallow Study   General [name] is a 84 y.o. female with medical history significant of achalasia (see below), GERD, HTN, CKD stage III, breast cancer, current UTIs with Klebsiella pneumonia, and frequent falls related to orthostatic hypotension; who presents after having a unwitnessed fall at home. Per chart pt has lost some weight, intermittently complains of some mild shortness of breath, is on a soft diet due to history of achalasia, intermittent nausea, and vomiting. Found to have closed displaced supracondylar fracture of distal end of left femor and underwent surgery 12/23. CXR No active lung disease. Stable right paratracheal mass, consistent with dilated thoracic esophagus. Type of Study: Bedside Swallow Evaluation Previous Swallow Assessment: (see HPI) Diet Prior to this Study: Thin liquids;Other (Comment)(soft texture) Temperature Spikes Noted: Yes Respiratory Status: Room air History of Recent Intubation: Yes Length of Intubations (days): (during surgery) Date extubated: 06/16/18 Behavior/Cognition: Alert;Confused;Requires cueing;Cooperative;Pleasant mood Oral Cavity Assessment: Within Functional Limits Oral Care Completed by SLP: No Oral Cavity -  Dentition: Dentures, top;Dentures, bottom Vision: Functional for self-feeding Self-Feeding Abilities: Able to feed self;Needs assist Patient Positioning: Upright in bed Baseline Vocal Quality: Low vocal intensity Volitional Cough: Strong Volitional Swallow: Able to elicit    Oral/Motor/Sensory Function Overall Oral Motor/Sensory Function: Within functional limits   Ice Chips Ice chips: Not tested   Thin Liquid Thin Liquid: Within functional limits Presentation: Cup;Straw    Nectar Thick Nectar Thick Liquid: Not tested   Honey Thick Honey Thick Liquid: Not tested   Puree Puree: Within functional limits   Solid     Solid: Impaired Oral Phase Functional Implications: Prolonged oral transit Pharyngeal Phase Impairments: (none)      Jinnifer Montejano, Orbie Pyo 06/17/2018,1:25 PM  Orbie Pyo [place].Ed Risk analyst [phone] Office [phone]

## 2018-06-17 NOTE — Progress Notes (Signed)
Patient is confused and combative and not cooperating with care. Patient encouraged to calm down. Will continue to monitor.

## 2018-06-18 LAB — BASIC METABOLIC PANEL
Anion gap: 7 (ref 5–15)
BUN: 28 mg/dL — ABNORMAL HIGH (ref 8–23)
CALCIUM: 7.5 mg/dL — AB (ref 8.9–10.3)
CO2: 27 mmol/L (ref 22–32)
Chloride: 106 mmol/L (ref 98–111)
Creatinine, Ser: 1.3 mg/dL — ABNORMAL HIGH (ref 0.44–1.00)
GFR calc Af Amer: 41 mL/min — ABNORMAL LOW (ref >60)
GFR calc non Af Amer: 35 mL/min — ABNORMAL LOW (ref >60)
Glucose, Bld: 105 mg/dL — ABNORMAL HIGH (ref 70–99)
Potassium: 3.9 mmol/L (ref 3.5–5.1)
Sodium: 140 mmol/L (ref 135–145)

## 2018-06-18 LAB — CBC
HCT: 25.4 % — ABNORMAL LOW (ref 36.0–46.0)
Hemoglobin: 8.8 g/dL — ABNORMAL LOW (ref 12.0–15.0)
MCH: 30 pg (ref 26.0–34.0)
MCHC: 34.6 g/dL (ref 30.0–36.0)
MCV: 86.7 fL (ref 80.0–100.0)
Platelets: 170 10*3/uL (ref 150–400)
RBC: 2.93 MIL/uL — ABNORMAL LOW (ref 3.87–5.11)
RDW: 15.3 % (ref 11.5–15.5)
WBC: 11.1 10*3/uL — AB (ref 4.0–10.5)
nRBC: 0 % (ref 0.0–0.2)

## 2018-06-18 LAB — TYPE AND SCREEN
ABO/RH(D): A POS
Antibody Screen: NEGATIVE
Unit division: 0
Unit division: 0

## 2018-06-18 LAB — BPAM RBC
Blood Product Expiration Date: 201912272359
Blood Product Expiration Date: 202001152359
ISSUE DATE / TIME: 201912240721
ISSUE DATE / TIME: 201912241048
Unit Type and Rh: 6200
Unit Type and Rh: 6200

## 2018-06-18 NOTE — Progress Notes (Signed)
PROGRESS NOTE  Veronica Phillips TKP:[id number] DOB: 03-Dec-1922 DOA: 28-Jun-202019 PCP: Dorothyann Peng, NP  HPI/Recap of past 24 hours: Veronica Phillips is a 84 y.o. female with medical history significant of HTN, CKD stage III, breast cancer, current UTIs with Klebsiella pneumonia, and frequent falls related to orthostatic hypotension; who presents after having a unwitnessed fall at home.  Patient and family help provide history.  Patient lives at home with daughter and normally ambulates with the use of a walker or in a wheelchair. The patient's daughter had been out the house, and patient reports hearing a noise for which she got up and went to the front door.  Family notes that she normally does not get up without assistance, but has been more confused intermittently.  They relate symptoms to her having frequent UTIs for which she is currently on a 90-day course of ciprofloxacin.  Patient reports that she hit her head on concrete porch.  It is unclear if she lost consciousness, but family believes that she likely lost consciousness.  He makes note that patient haslosing some weight, intermittently complains of some mild shortness of breath, is on a soft diet due to history of achalasia, intermittent nausea, and vomiting.   No acute issues overnight.  Assessment/Plan: Principal Problem:   Closed displaced supracondylar fracture of distal end of left femur without intracondylar extension (HCC) Active Problems:   Achalasia   Orthostatic syncope   Fall at home, initial encounter   Recurrent UTI   Acute metabolic encephalopathy   Pressure injury of skin   Left distal femur fracture secondary to fall at home s/p retrograde intramedullary fixation of left supracondylar femur fracture on 06/16/18 Management by Ortho Pain, DVT management by Ortho  Acute metabolic encephalopathy Resolved CT head unremarkable for any acute intracranial findings Chest x-ray unremarkable  Acute blood loss/iron  deficiency anemia Post op, hemoglobin 5.7, transfused 2 PRBC 06/17/18. Post transfusion H/H stable. Continue home iron supplement  History of recurrent UTI Per her daughter she is on ongoing UTI prophylactic treatment day 25/30 , discontiued p.o. ciprofloxacin 06/17/18. Urine culture with no growth  Ambulatory dysfunction status post fall PT to assess when indicated Fall precautions  AKI on CKD 3 Creatinine trending down today to 1.3, baseline creatinine of 1.08 and GFR of 50 Avoid nephrotoxic agents/dehydration Monitor urine output  Orthostatic hypotension Blood pressure stable Patient not on any medication to treat for the symptoms  Achalasia and GERD Patient intermittently has difficulty swallowing certain food, and family reports should be on a soft diet. Aspiration precautions Soft diet Continue to pharmacy substitution of Protonix   DVT prophylaxis:  Subcu Lovenox   Code Status: Full code Family Communication:  None at bedside Disposition Plan: TBD  Consults called: ortho      Objective: Vitals:   06/17/18 2043 06/18/18 0444 06/18/18 1423 06/18/18 2035  BP: 126/61 (!) 147/78 131/72 116/67  Pulse: 83 85 77 84  Resp: 19 (!) 26 18   Temp: 99.5 F (37.5 C) 99.1 F (37.3 C) 98.8 F (37.1 C) 99.1 F (37.3 C)  TempSrc: Oral Oral Oral Oral  SpO2: 99% 99% 97% 99%  Weight:      Height:        Intake/Output Summary (Last 24 hours) at 06/18/2018 2059 Last data filed at 06/18/2018 1707 Gross per 24 hour  Intake 600 ml  Output -  Net 600 ml   Filed Weights   06/17/18 2000  Weight: 46 kg    Exam:  General: NAD, pale  Cardiovascular: S1, S2 present  Respiratory: CTAB  Abdomen: Soft, nontender, nondistended, bowel sounds present  Musculoskeletal: No bilateral pedal edema noted, LLE in immobilizer  Skin: Normal  Psychiatry: Normal mood    Data Reviewed: CBC: Recent Labs  Lab 06/15/18 1728 06/17/18 0504 06/17/18 1633 06/18/18 0329  WBC  7.7 10.7*  --  11.1*  NEUTROABS 6.3  --   --   --   HGB 10.3* 5.7* 9.1* 8.8*  HCT 32.9* 18.0* 27.6* 25.4*  MCV 95.1 92.3  --  86.7  PLT 236 159  --  712   Basic Metabolic Panel: Recent Labs  Lab 06/15/18 1728 06/17/18 0317 06/18/18 0329  NA 143 139 140  K 3.8 3.8 3.9  CL 106 106 106  CO2 26 22 27   GLUCOSE 123* 173* 105*  BUN 19 25* 28*  CREATININE 1.14* 1.43* 1.30*  CALCIUM 8.4* 7.3* 7.5*   GFR: Estimated Creatinine Clearance: 19.6 mL/min (A) (by C-G formula based on SCr of 1.3 mg/dL (H)). Liver Function Tests: Recent Labs  Lab 06/15/18 1728  AST 22  ALT 16  ALKPHOS 50  BILITOT 0.7  PROT 6.2*  ALBUMIN 3.5   No results for input(s): LIPASE, AMYLASE in the last 168 hours. No results for input(s): AMMONIA in the last 168 hours. Coagulation Profile: No results for input(s): INR, PROTIME in the last 168 hours. Cardiac Enzymes: No results for input(s): CKTOTAL, CKMB, CKMBINDEX, TROPONINI in the last 168 hours. BNP (last 3 results) No results for input(s): PROBNP in the last 8760 hours. HbA1C: No results for input(s): HGBA1C in the last 72 hours. CBG: No results for input(s): GLUCAP in the last 168 hours. Lipid Profile: No results for input(s): CHOL, HDL, LDLCALC, TRIG, CHOLHDL, LDLDIRECT in the last 72 hours. Thyroid Function Tests: No results for input(s): TSH, T4TOTAL, FREET4, T3FREE, THYROIDAB in the last 72 hours. Anemia Panel: No results for input(s): VITAMINB12, FOLATE, FERRITIN, TIBC, IRON, RETICCTPCT in the last 72 hours. Urine analysis:    Component Value Date/Time   COLORURINE YELLOW 06/16/2018 1248   APPEARANCEUR CLEAR 06/16/2018 1248   LABSPEC 1.020 06/16/2018 1248   PHURINE 5.5 06/16/2018 1248   GLUCOSEU NEGATIVE 06/16/2018 1248   GLUCOSEU NEGATIVE 04/17/2018 1455   HGBUR LARGE (A) 06/16/2018 1248   HGBUR 2+ 07/11/2010 0921   BILIRUBINUR NEGATIVE 06/16/2018 1248   BILIRUBINUR 1+ 05/08/2018 1058   KETONESUR NEGATIVE 06/16/2018 1248   PROTEINUR  NEGATIVE 06/16/2018 1248   UROBILINOGEN 0.2 05/08/2018 1058   UROBILINOGEN 0.2 04/17/2018 1455   NITRITE NEGATIVE 06/16/2018 1248   LEUKOCYTESUR SMALL (A) 06/16/2018 1248   Sepsis Labs: @LABRCNTIP (procalcitonin:4,lacticidven:4)  ) Recent Results (from the past 240 hour(s))  Culture, Urine     Status: None   Collection Time: 06/15/18 11:08 PM  Result Value Ref Range Status   Specimen Description URINE, CATHETERIZED  Final   Special Requests NONE  Final   Culture   Final    NO GROWTH Performed at [facility], [place] [address], [place], [place] [address]    Report Status 06/17/2018 FINAL  Final  MRSA PCR Screening     Status: None   Collection Time: 06/16/18  9:00 AM  Result Value Ref Range Status   MRSA by PCR NEGATIVE NEGATIVE Final    Comment:        The GeneXpert MRSA Assay (FDA approved for NASAL specimens only), is one component of a comprehensive MRSA colonization surveillance program. It is not intended  to diagnose MRSA infection nor to guide or monitor treatment for MRSA infections. Performed at [facility], [place] [address] [place], [place] [address]       Studies: Dg Femur [place] 2 Views Left  Result Date: 06/17/2018 CLINICAL DATA:  Status post open reduction and internal fixation. EXAM: LEFT FEMUR PORTABLE 2 VIEWS COMPARISON:  06/16/2018 FINDINGS: Distal left femoral fracture is again identified with medullary rod and multiple fixation screws both proximally and distally. No new fracture is noted. Soft tissue swelling is noted related to the recent injury. No acute bony abnormality is seen. IMPRESSION: Status post ORIF of distal left femoral fracture. Electronically Signed   By: Inez Catalina M.D.   On: 06/17/2018 21:07    Scheduled Meds: . docusate  100 mg Oral BID  . enoxaparin (LOVENOX) injection  40 mg Subcutaneous Q24H  . lactose free nutrition  237 mL Oral TID WC  . latanoprost  1 drop Both Eyes QHS  . pantoprazole  40 mg Oral Daily  .  potassium chloride  10 mEq Oral Daily    Continuous Infusions: . sodium chloride 75 mL/hr at 06/16/18 1900  . lactated ringers 10 mL/hr at 06/16/18 1244  . methocarbamol (ROBAXIN) IV       LOS: 3 days     Alma Friendly, MD [facility] 06/18/2018, 8:59 PM

## 2018-06-19 DIAGNOSIS — K220 Achalasia of cardia: Secondary | ICD-10-CM

## 2018-06-19 DIAGNOSIS — I951 Orthostatic hypotension: Secondary | ICD-10-CM

## 2018-06-19 DIAGNOSIS — G9341 Metabolic encephalopathy: Secondary | ICD-10-CM

## 2018-06-19 DIAGNOSIS — N390 Urinary tract infection, site not specified: Secondary | ICD-10-CM

## 2018-06-19 DIAGNOSIS — N189 Chronic kidney disease, unspecified: Secondary | ICD-10-CM

## 2018-06-19 DIAGNOSIS — N179 Acute kidney failure, unspecified: Secondary | ICD-10-CM

## 2018-06-19 LAB — CBC
HCT: 23.8 % — ABNORMAL LOW (ref 36.0–46.0)
HCT: 24.7 % — ABNORMAL LOW (ref 36.0–46.0)
Hemoglobin: 7.8 g/dL — ABNORMAL LOW (ref 12.0–15.0)
Hemoglobin: 8.3 g/dL — ABNORMAL LOW (ref 12.0–15.0)
MCH: 28.6 pg (ref 26.0–34.0)
MCH: 30 pg (ref 26.0–34.0)
MCHC: 32.8 g/dL (ref 30.0–36.0)
MCHC: 33.6 g/dL (ref 30.0–36.0)
MCV: 87.2 fL (ref 80.0–100.0)
MCV: 89.2 fL (ref 80.0–100.0)
PLATELETS: 203 10*3/uL (ref 150–400)
Platelets: 169 10*3/uL (ref 150–400)
RBC: 2.73 MIL/uL — ABNORMAL LOW (ref 3.87–5.11)
RBC: 2.77 MIL/uL — ABNORMAL LOW (ref 3.87–5.11)
RDW: 15.4 % (ref 11.5–15.5)
RDW: 15.4 % (ref 11.5–15.5)
WBC: 9.5 10*3/uL (ref 4.0–10.5)
WBC: 8.2 10*3/uL (ref 4.0–10.5)
nRBC: 0 % (ref 0.0–0.2)
nRBC: 0 % (ref 0.0–0.2)

## 2018-06-19 LAB — BASIC METABOLIC PANEL
Anion gap: 7 (ref 5–15)
BUN: 24 mg/dL — ABNORMAL HIGH (ref 8–23)
CO2: 22 mmol/L (ref 22–32)
Calcium: 7.5 mg/dL — ABNORMAL LOW (ref 8.9–10.3)
Chloride: 111 mmol/L (ref 98–111)
Creatinine, Ser: 1.1 mg/dL — ABNORMAL HIGH (ref 0.44–1.00)
GFR calc Af Amer: 50 mL/min — ABNORMAL LOW (ref >60)
GFR calc non Af Amer: 43 mL/min — ABNORMAL LOW (ref >60)
Glucose, Bld: 97 mg/dL (ref 70–99)
Potassium: 3.8 mmol/L (ref 3.5–5.1)
SODIUM: 140 mmol/L (ref 135–145)

## 2018-06-19 MED ORDER — ENOXAPARIN SODIUM 30 MG/0.3ML ~~LOC~~ SOLN
30.0000 mg | SUBCUTANEOUS | Status: DC
  Administered 2018-06-20 – 2018-06-22 (×3): 30 mg via SUBCUTANEOUS
  Filled 2018-06-19 (×3): qty 0.3

## 2018-06-19 NOTE — Progress Notes (Signed)
Physical Therapy Treatment Patient Details Name: Veronica Phillips MRN: [id number] DOB: [birth year] Today's Date: 06/19/2018    History of Present Illness Veronica Phillips is a 84 y.o. female with medical history significant of HTN, CKD stage III, breast cancer, current UTIs with Klebsiella pneumonia, and frequent falls related to orthostatic hypotension; who presents after having a unwitnessed fall at home. Pt sustained a L distal femur fx and underwent IM nail 06/16/18.     PT Comments    Patient seen for mobility progression. Pt is pleasant and participatory and reports "I don't give up easy". Pt requires mod A +2 for functional transfer training and is able to pivot R foot laterally toward HOB ~1 ft. Pt is unable to maintain NWB status without assistance. Continue to progress as tolerated with anticipated d/c to SNF for further skilled PT services.     Follow Up Recommendations  SNF;Supervision for mobility/OOB     Equipment Recommendations  None recommended by PT    Recommendations for Other Services       Precautions / Restrictions Precautions Precautions: Fall Required Braces or Orthoses: Other Brace Other Brace: Brace donned on left leg upon arrival Restrictions Weight Bearing Restrictions: Yes LLE Weight Bearing: Non weight bearing    Mobility  Bed Mobility Overal bed mobility: Needs Assistance Bed Mobility: Supine to Sit;Sit to Supine     Supine to sit: Mod assist;HOB elevated Sit to supine: Mod assist   General bed mobility comments: assistance to bring L LE to EOB and to elevate trunk into sitting; pt able to use R LE to bring hips to EOB  Transfers Overall transfer level: Needs assistance Equipment used: Rolling walker (2 wheeled) Transfers: Sit to/from Stand Sit to Stand: +2 physical assistance;Mod assist         General transfer comment: assistance required to power up into standing and to maintain NWB status; cues for positioning prior to standing and  for posture and weight shifting in standing   Ambulation/Gait             General Gait Details: pt is able to pivot R foot laterally 1 foot toward [facility] with assist for balance and managing RW   Stairs             Wheelchair Mobility    Modified Rankin (Stroke Patients Only)       Balance Overall balance assessment: Needs assistance Sitting-balance support: No upper extremity supported;Feet supported Sitting balance-Leahy Scale: Fair   Postural control: Posterior lean Standing balance support: Bilateral upper extremity supported;During functional activity Standing balance-Leahy Scale: Poor                              Cognition Arousal/Alertness: Awake/alert Behavior During Therapy: WFL for tasks assessed/performed Overall Cognitive Status: History of cognitive impairments - at baseline                                 General Comments: Baseline dementia; pt is pleasant and participatory      Exercises      General Comments        Pertinent Vitals/Pain Pain Assessment: Faces Faces Pain Scale: Hurts little more Pain Location: left leg Pain Descriptors / Indicators: Operative site guarding;Sore;Grimacing Pain Intervention(s): Limited activity within patient's tolerance;Monitored during session;Repositioned    Home Living  Prior Function            PT Goals (current goals can now be found in the care plan section) Acute Rehab PT Goals Patient Stated Goal: "Walk again" Progress towards PT goals: Progressing toward goals    Frequency    Min 2X/week      PT Plan Current plan remains appropriate    Co-evaluation              AM-PAC PT "6 Clicks" Mobility   Outcome Measure  Help needed turning from your back to your side while in a flat bed without using bedrails?: A Lot Help needed moving from lying on your back to sitting on the side of a flat bed without using bedrails?: A  Lot Help needed moving to and from a bed to a chair (including a wheelchair)?: A Lot Help needed standing up from a chair using your arms (e.g., wheelchair or bedside chair)?: A Lot Help needed to walk in hospital room?: Total Help needed climbing 3-5 steps with a railing? : Total 6 Click Score: 10    End of Session Equipment Utilized During Treatment: Gait belt Activity Tolerance: Patient tolerated treatment well Patient left: with call bell/phone within reach;in bed Nurse Communication: Mobility status PT Visit Diagnosis: Unsteadiness on feet (R26.81);History of falling (Z91.81);Difficulty in walking, not elsewhere classified (R26.2);Pain Pain - Right/Left: Left Pain - part of body: Hip     Time: 1511-1530 PT Time Calculation (min) (ACUTE ONLY): 19 min  Charges:  $Therapeutic Activity: 8-22 mins                     Earney Navy, PTA Acute Rehabilitation Services Pager: [phone] Office: [phone]     Darliss Cheney 06/19/2018, 5:20 PM

## 2018-06-19 NOTE — Anesthesia Postprocedure Evaluation (Signed)
Anesthesia Post Note  Patient: Veronica Phillips  Procedure(s) Performed: OPEN REDUCTION INTERNAL FIXATION (ORIF) DISTAL FEMUR FRACTURE (Left Leg Upper)     Patient location during evaluation: PACU Anesthesia Type: General Level of consciousness: awake and alert Pain management: pain level controlled Vital Signs Assessment: post-procedure vital signs reviewed and stable Respiratory status: spontaneous breathing, nonlabored ventilation, respiratory function stable and patient connected to nasal cannula oxygen Cardiovascular status: blood pressure returned to baseline and stable Postop Assessment: no apparent nausea or vomiting Anesthetic complications: no    Last Vitals:  Vitals:   06/18/18 2035 06/19/18 0558  BP: 116/67 (!) 148/73  Pulse: 84 (!) 59  Resp:    Temp: 37.3 C 36.7 C  SpO2: 99% 90%    Last Pain:  Vitals:   06/19/18 0558  TempSrc: Oral  PainSc:                  Barnet Glasgow

## 2018-06-19 NOTE — Progress Notes (Signed)
PROGRESS NOTE    Veronica Phillips  KNL:[id number] DOB: [birth year] DOA: 06-22-202019 PCP: Dorothyann Peng, NP   Brief Narrative: Veronica Phillips is a 84 y.o. femalewith medical history significant ofHTN, CKD stage III, breast cancer, frequent UTIs, orthostatic hypotension. She presented after falling at home and suffering a femur fracture. She is s/p ORIF.    Assessment & Plan:   Principal Problem:   Closed displaced supracondylar fracture of distal end of left femur without intracondylar extension (HCC) Active Problems:   Achalasia   Orthostatic syncope   Fall at home, initial encounter   Recurrent UTI   Acute metabolic encephalopathy   Pressure injury of skin   Left femur fracture Patient is s/p ORIF on 12/23. Plan for discharge to SNF when medically stable.  Acute blood loss anemia Iron deficiency anemia In setting of recent surgery and Lovenox treatment. No evidence of hematoma on bedside evaluation. Hemoglobin as low as 5.7. Baseline of 9-10. S/p 2 units of PRBC to date. Hemoglobin trending down. -Repeat CBC this afternoon, if trending down significantly, may need to discontinue Lovenox -Continue iron supplementation  Orthostatic syncope PT recommending SNF  AKI on CKD stage 3 Creatinine baseline of 1.1. Creatinine of 1.43 on admission and currently down to 1.3.  Acute metabolic encephalopathy CT head obtained which was unremarkable. Appears resolved. Unknown etiology. ?Pain related but unsure.  Pressure injury of the skin Mid-sacral, present on admission  Recurrent UTIs Completed prophylactic course. Urine culture unremarkable.  Achalasia Chronic. Intermittent difficulties with food -Continue soft diet  GERD -Continue Protonix   DVT prophylaxis: Lovenox Code Status:   Code Status: Full Code Family Communication: None at bedside Disposition Plan: Discharge to SNF when hemoglobin stable   Consultants:   Orthopedic surgery  Procedures:   12/23:  ORIF  Antimicrobials:  Cefazolin  Ciprofloxacin    Subjective: Has some pain of her leg when moving, otherwise, she is comfortable.  Objective: Vitals:   06/18/18 1423 06/18/18 2035 06/19/18 0558 06/19/18 1354  BP: 131/72 116/67 (!) 148/73 (!) 117/57  Pulse: 77 84 (!) 59 90  Resp: 18   16  Temp: 98.8 F (37.1 C) 99.1 F (37.3 C) 98.1 F (36.7 C) 98.4 F (36.9 C)  TempSrc: Oral Oral Oral Oral  SpO2: 97% 99% 90% 100%  Weight:      Height:        Intake/Output Summary (Last 24 hours) at 06/19/2018 1530 Last data filed at 06/19/2018 0915 Gross per 24 hour  Intake 600 ml  Output 600 ml  Net 0 ml   Filed Weights   06/17/18 2000  Weight: 46 kg    Examination:  General exam: Appears calm and comfortable Respiratory system: Clear to auscultation. Respiratory effort normal. Cardiovascular system: S1 & S2 heard, RRR. No murmurs, rubs, gallops or clicks. Gastrointestinal system: Abdomen is nondistended, soft and nontender. No organomegaly or masses felt. Normal bowel sounds heard. Central nervous system: Alert and oriented. No focal neurological deficits. Extremities: No edema. No calf tenderness Skin: No cyanosis. No rashes. Dried blood over right IV site. No noticeable area of ecchymosis of left leg Psychiatry: Judgement and insight appear normal. Mood & affect appropriate.     Data Reviewed: I have personally reviewed following labs and imaging studies  CBC: Recent Labs  Lab 06/15/18 1728 06/17/18 0504 06/17/18 1633 06/18/18 0329 06/19/18 0250  WBC 7.7 10.7*  --  11.1* 9.5  NEUTROABS 6.3  --   --   --   --  HGB 10.3* 5.7* 9.1* 8.8* 7.8*  HCT 32.9* 18.0* 27.6* 25.4* 23.8*  MCV 95.1 92.3  --  86.7 87.2  PLT 236 159  --  170 166   Basic Metabolic Panel: Recent Labs  Lab 06/15/18 1728 06/17/18 0317 06/18/18 0329  NA 143 139 140  K 3.8 3.8 3.9  CL 106 106 106  CO2 26 22 27   GLUCOSE 123* 173* 105*  BUN 19 25* 28*  CREATININE 1.14* 1.43* 1.30*   CALCIUM 8.4* 7.3* 7.5*   GFR: Estimated Creatinine Clearance: 19.6 mL/min (A) (by C-G formula based on SCr of 1.3 mg/dL (H)). Liver Function Tests: Recent Labs  Lab 06/15/18 1728  AST 22  ALT 16  ALKPHOS 50  BILITOT 0.7  PROT 6.2*  ALBUMIN 3.5   No results for input(s): LIPASE, AMYLASE in the last 168 hours. No results for input(s): AMMONIA in the last 168 hours. Coagulation Profile: No results for input(s): INR, PROTIME in the last 168 hours. Cardiac Enzymes: No results for input(s): CKTOTAL, CKMB, CKMBINDEX, TROPONINI in the last 168 hours. BNP (last 3 results) No results for input(s): PROBNP in the last 8760 hours. HbA1C: No results for input(s): HGBA1C in the last 72 hours. CBG: No results for input(s): GLUCAP in the last 168 hours. Lipid Profile: No results for input(s): CHOL, HDL, LDLCALC, TRIG, CHOLHDL, LDLDIRECT in the last 72 hours. Thyroid Function Tests: No results for input(s): TSH, T4TOTAL, FREET4, T3FREE, THYROIDAB in the last 72 hours. Anemia Panel: No results for input(s): VITAMINB12, FOLATE, FERRITIN, TIBC, IRON, RETICCTPCT in the last 72 hours. Sepsis Labs: No results for input(s): PROCALCITON, LATICACIDVEN in the last 168 hours.  Recent Results (from the past 240 hour(s))  Culture, Urine     Status: None   Collection Time: 06/15/18 11:08 PM  Result Value Ref Range Status   Specimen Description URINE, CATHETERIZED  Final   Special Requests NONE  Final   Culture   Final    NO GROWTH Performed at [facility], [address], [place], [place] [address]    Report Status 06/17/2018 FINAL  Final  MRSA PCR Screening     Status: None   Collection Time: 06/16/18  9:00 AM  Result Value Ref Range Status   MRSA by PCR NEGATIVE NEGATIVE Final    Comment:        The GeneXpert MRSA Assay (FDA approved for NASAL specimens only), is one component of a comprehensive MRSA colonization surveillance program. It is not intended to diagnose MRSA infection  nor to guide or monitor treatment for MRSA infections. Performed at [facility], [place] [address], [place], [place] [address]          Radiology Studies: Dg Femur [place] 2 Views Left  Result Date: 06/17/2018 CLINICAL DATA:  Status post open reduction and internal fixation. EXAM: LEFT FEMUR PORTABLE 2 VIEWS COMPARISON:  06/16/2018 FINDINGS: Distal left femoral fracture is again identified with medullary rod and multiple fixation screws both proximally and distally. No new fracture is noted. Soft tissue swelling is noted related to the recent injury. No acute bony abnormality is seen. IMPRESSION: Status post ORIF of distal left femoral fracture. Electronically Signed   By: Inez Catalina M.D.   On: 06/17/2018 21:07        Scheduled Meds: . docusate  100 mg Oral BID  . enoxaparin (LOVENOX) injection  40 mg Subcutaneous Q24H  . lactose free nutrition  237 mL Oral TID WC  . latanoprost  1 drop Both Eyes QHS  . pantoprazole  40 mg Oral Daily  . potassium chloride  10 mEq Oral Daily   Continuous Infusions: . sodium chloride 75 mL/hr at 06/16/18 1900  . lactated ringers 10 mL/hr at 06/16/18 1244  . methocarbamol (ROBAXIN) IV       LOS: 4 days     Cordelia Poche, MD [facility] 06/19/2018, 3:30 PM  If 7PM-7AM, please contact night-coverage [email or web address]

## 2018-06-19 NOTE — Care Management Important Message (Signed)
Important Message  Patient Details  Name: Veronica Phillips MRN: [id number] Date of Birth: Mar 25, 1923   Medicare Important Message Given:  Yes    [name] [name] 06/19/2018, 5:01 PM

## 2018-06-20 ENCOUNTER — Inpatient Hospital Stay: Admission: RE | Admit: 2018-06-20 | Payer: Medicare Other | Source: Ambulatory Visit

## 2018-06-20 LAB — CBC
HCT: 26.4 % — ABNORMAL LOW (ref 36.0–46.0)
HEMOGLOBIN: 8.8 g/dL — AB (ref 12.0–15.0)
MCH: 29.8 pg (ref 26.0–34.0)
MCHC: 33.3 g/dL (ref 30.0–36.0)
MCV: 89.5 fL (ref 80.0–100.0)
Platelets: 160 10*3/uL (ref 150–400)
RBC: 2.95 MIL/uL — ABNORMAL LOW (ref 3.87–5.11)
RDW: 15.4 % (ref 11.5–15.5)
WBC: 7.7 10*3/uL (ref 4.0–10.5)
nRBC: 0 % (ref 0.0–0.2)

## 2018-06-20 MED ORDER — ENOXAPARIN SODIUM 30 MG/0.3ML ~~LOC~~ SOLN: 30.0000 mg | SUBCUTANEOUS | Status: DC

## 2018-06-20 NOTE — NC FL2 (Signed)
[place] MEDICAID FL2 LEVEL OF CARE SCREENING TOOL     IDENTIFICATION  Patient Name: Veronica Phillips Birthdate: 12-Feb-1923 Sex: female Admission Date (Current Location): October 18, 202019  [facility] and [place] Number:  Herbalist and Address:  [facility], [place] [address], [place], [place] [address]      Provider Number: [id number]  Attending Physician Name and Address:  Mariel Aloe, MD  Relative Name and Phone Number:  [name] (daughter) [phone]    Current Level of Care: Hospital Recommended Level of Care: [place] Prior Approval Number:    Date Approved/Denied: 06/20/18 PASRR Number: [id number]  Discharge Plan:      Current Diagnoses: Patient Active Problem List   Diagnosis Date Noted  . Pressure injury of skin 06/16/2018  . Closed displaced supracondylar fracture of distal end of left femur without intracondylar extension ([place]) October 18, 202019  . Fall at home, initial encounter October 18, 202019  . Recurrent UTI October 18, 202019  . Acute metabolic encephalopathy 44/81/8563  . Orthostatic hypotension 03/11/2018  . Acute encephalopathy 03/11/2018  . Bilateral primary osteoarthritis of knee 02/27/2018  . Weight loss 08/30/2017  . S/p left hip fracture 07/12/2017  . Subcoracoid bursitis, right 07/12/2017  . CKD (chronic kidney disease), stage III ([place]) 12/29/2015  . Dizziness 12/29/2015  . Lower urinary tract infectious disease 12/29/2015  . Orthostatic syncope 12/29/2015  . Fall   . Chronic anemia   . Need for prophylactic vaccination against Streptococcus pneumoniae (pneumococcus) 01/06/2015  . Precordial chest pain 11/05/2014  . Dysphagia, pharyngoesophageal phase   . 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 Achalasia 07/07/2013  . Peripheral edema 05/19/2012  . Acute cystitis without hematuria 02/08/2011  . Glaucoma 07/17/2010  . GERD 07/17/2010  . OTHER DYSPHAGIA 07/17/2010  . PROBLEMS WITH SWALLOWING AND MASTICATION  07/11/2010  . IDIOPATHIC URTICARIA 04/11/2010  . ANEMIA, B12 DEFICIENCY 05/18/2008  . OSTEOARTHRITIS 05/12/2007  . DIVERTICULOSIS, COLON 02/26/2007    Orientation RESPIRATION BLADDER Height & Weight     Self, Situation, Place  Normal Incontinent, External catheter Weight: 46 kg Height:  5\' 3"  (160 cm)  BEHAVIORAL SYMPTOMS/MOOD NEUROLOGICAL BOWEL NUTRITION STATUS      Continent Diet(see discharge summary)  AMBULATORY STATUS COMMUNICATION OF NEEDS Skin   Extensive Assist Verbally PU Stage and Appropriate Care(left leg closed surgical incision, pressure injury stage 1 mid sacrum)                       Personal Care Assistance Level of Assistance  Bathing, Feeding, Dressing, Total care Bathing Assistance: Maximum assistance Feeding assistance: Independent Dressing Assistance: Maximum assistance Total Care Assistance: Maximum assistance   Functional Limitations Info  Sight, Hearing, Speech Sight Info: Adequate Hearing Info: Adequate Speech Info: Adequate    SPECIAL CARE FACTORS FREQUENCY  PT (By licensed PT), OT (By licensed OT)     PT Frequency: min 5x weekly OT Frequency: min 5x weekly            Contractures Contractures Info: Not present    Additional Factors Info  Code Status, Allergies Code Status Info: full Allergies Info: Septra (bactrim), lactose intolerance, sulfa antibiotics, tape           Current Medications (06/20/2018):  This is the current hospital active medication list Current Facility-Administered Medications  Medication Dose Route Frequency Provider Last Rate Last Dose  . 0.9 %  sodium chloride infusion   Intravenous Continuous Alma Friendly, MD 75 mL/hr at 06/16/18 1900    .  acetaminophen (TYLENOL) tablet 325-650 mg  325-650 mg Oral Q6H PRN Leandrew Koyanagi, MD   650 mg at 06/18/18 1809  . albuterol (PROVENTIL) (2.5 MG/3ML) 0.083% nebulizer solution 2.5 mg  2.5 mg Nebulization Q6H PRN Leandrew Koyanagi, MD      . alum & mag  hydroxide-simeth (MAALOX/MYLANTA) 200-200-20 MG/5ML suspension 30 mL  30 mL Oral Q4H PRN Leandrew Koyanagi, MD   30 mL at 06/19/18 1844  . docusate (COLACE) 50 MG/5ML liquid 100 mg  100 mg Oral BID Alma Friendly, MD   100 mg at 06/20/18 0801  . enoxaparin (LOVENOX) injection 30 mg  30 mg Subcutaneous Q24H Mariel Aloe, MD      . fentaNYL (SUBLIMAZE) injection 25 mcg  25 mcg Intravenous Q1H PRN Leandrew Koyanagi, MD   25 mcg at 06/15/18 1734  . HYDROcodone-acetaminophen (NORCO) 7.5-325 MG per tablet 1-2 tablet  1-2 tablet Oral Q4H PRN Leandrew Koyanagi, MD      . HYDROcodone-acetaminophen (NORCO/VICODIN) 5-325 MG per tablet 1-2 tablet  1-2 tablet Oral Q6H PRN Leandrew Koyanagi, MD   2 tablet at 06/16/18 1007  . HYDROcodone-acetaminophen (NORCO/VICODIN) 5-325 MG per tablet 1-2 tablet  1-2 tablet Oral Q4H PRN Leandrew Koyanagi, MD   1 tablet at 06/20/18 0805  . lactated ringers infusion   Intravenous Continuous Leandrew Koyanagi, MD 10 mL/hr at 06/16/18 1244    . lactose free nutrition (BOOST PLUS) liquid 237 mL  237 mL Oral TID WC Alma Friendly, MD   237 mL at 06/20/18 0800  . latanoprost (XALATAN) 0.005 % ophthalmic solution 1 drop  1 drop Both Eyes QHS Leandrew Koyanagi, MD   1 drop at 06/19/18 2139  . magnesium citrate solution 1 Bottle  1 Bottle Oral Once PRN Leandrew Koyanagi, MD      . menthol-cetylpyridinium (CEPACOL) lozenge 3 mg  1 lozenge Oral PRN Leandrew Koyanagi, MD       Or  . phenol (CHLORASEPTIC) mouth spray 1 spray  1 spray Mouth/Throat PRN Leandrew Koyanagi, MD      . methocarbamol (ROBAXIN) tablet 500 mg  500 mg Oral Q6H PRN Leandrew Koyanagi, MD       Or  . methocarbamol (ROBAXIN) 500 mg in dextrose 5 % 50 mL IVPB  500 mg Intravenous Q6H PRN Leandrew Koyanagi, MD      . morphine 2 MG/ML injection 0.5 mg  0.5 mg Intravenous Q2H PRN Leandrew Koyanagi, MD   0.5 mg at 06/17/18 0359  . morphine 2 MG/ML injection 1 mg  1 mg Intravenous Q2H PRN Leandrew Koyanagi, MD      . ondansetron [facility]) tablet 4 mg  4 mg Oral Q6H PRN  Leandrew Koyanagi, MD       Or  . ondansetron [facility]) injection 4 mg  4 mg Intravenous Q6H PRN Leandrew Koyanagi, MD      . pantoprazole (PROTONIX) EC tablet 40 mg  40 mg Oral Daily Leandrew Koyanagi, MD   40 mg at 06/20/18 0801  . polyethylene glycol (MIRALAX / GLYCOLAX) packet 17 g  17 g Oral Daily PRN Leandrew Koyanagi, MD      . potassium chloride (K-DUR,KLOR-CON) CR tablet 10 mEq  10 mEq Oral Daily Leandrew Koyanagi, MD   10 mEq at 06/20/18 0801  . sorbitol 70 % solution 30 mL  30 mL Oral Daily PRN Leandrew Koyanagi, MD  Discharge Medications: Please see discharge summary for a list of discharge medications.  Relevant Imaging Results:  Relevant Lab Results:   Additional Information SSN: [id number]  Alberteen Sam, LCSW

## 2018-06-20 NOTE — Clinical Social Work Note (Signed)
Clinical Social Work Assessment  Patient Details  Name: Veronica Phillips MRN: [id number] Date of Birth: 1922-12-19  Date of referral:  06/20/18               Reason for consult:  Discharge Planning                Permission sought to share information with:  Case Manager, Facility Sport and exercise psychologist, Family Supports Permission granted to share information::  Yes, Verbal Permission Granted  Name::     [name]  Agency::  SNFs  Relationship::  daughter  Contact Information:  [phone]  Housing/Transportation Living arrangements for the past 2 months:  Single Family Home Source of Information:  Patient Patient Interpreter Needed:  None Criminal Activity/Legal Involvement Pertinent to Current Situation/Hospitalization:  No - Comment as needed Significant Relationships:  Adult Children Lives with:  Self Do you feel safe going back to the place where you live?  No Need for family participation in patient care:  Yes (Comment)  Care giving concerns: CSW received referral for possible SNF placement at time of discharge. Spoke with patient regarding possibility of SNF placement . Patient's daughter   is currently unable to care for her at their home given patient's current needs and fall risk.  Patient and daughter at bedside expressed understanding of PT recommendation and are agreeable to SNF placement at time of discharge. CSW to continue to follow and assist with discharge planning needs.     Social Worker assessment / plan:  Spoke with patient and daughter concerning possibility of rehab at [facility] before returning home.    Employment status:  Retired Nurse, adult PT Recommendations:  [place] / Referral to community resources:  [place]  Patient/Family's Response to care:  Patient and daughter at bedside    recognize need for rehab before returning home and are agreeable to a SNF in [place]. They report no  preferences at this time. CSW provided list of SNF and daughter reports she would like to visit several today before contacting CSW with top three choices. CSW explained insurance authorization process. Patient's family reported that they want patient to get stronger to be able to come back home.    Patient/Family's Understanding of and Emotional Response to Diagnosis, Current Treatment, and Prognosis:  Patient/family is realistic regarding therapy needs and expressed being hopeful for SNF placement. Patient expressed understanding of CSW role and discharge process as well as medical condition. No questions/concerns about plan or treatment.    Emotional Assessment Appearance:  Appears stated age Attitude/Demeanor/Rapport:  Gracious Affect (typically observed):  Accepting Orientation:  Oriented to Self, Oriented to Place, Oriented to Situation Alcohol / Substance use:  Not Applicable Psych involvement (Current and /or in the community):  No (Comment)  Discharge Needs  Concerns to be addressed:  Discharge Planning Concerns Readmission within the last 30 days:  No Current discharge risk:  Dependent with Mobility Barriers to Discharge:  Continued Medical Work up   [employer], LCSW 06/20/2018, 1:39 PM

## 2018-06-20 NOTE — Progress Notes (Signed)
CSW consulted with daughter at bedside who reports she will look at [place] facilities this afternoon and call CSW with their top three choices.   CSW starting [employer] authorization today.   [place], [place]

## 2018-06-20 NOTE — Progress Notes (Signed)
Physical Therapy Treatment Patient Details Name: [name] MRN: [id number] DOB: 10-Jun-1923 Today's Date: 06/20/2018    History of Present Illness Veronica Phillips is a 84 y.o. female with medical history significant of HTN, CKD stage III, breast cancer, current UTIs with Klebsiella pneumonia, and frequent falls related to orthostatic hypotension; who presents after having a unwitnessed fall at home. Pt sustained a L distal femur fx and underwent IM nail 06/16/18.     PT Comments    Pt is limited in safe mobility by decreased strength and inability to move weight of LLE with brace without assist. Pt currently requires modA for bed mobility, maxAx2 for sit>stand and total Ax2 for transfer to recliner. D/c plans remain appropriate at this time. PT will continue to follow acutely.    Follow Up Recommendations  SNF;Supervision for mobility/OOB     Equipment Recommendations  None recommended by PT       Precautions / Restrictions Precautions Precautions: Fall Required Braces or Orthoses: Other Brace Other Brace: Brace donned on left leg upon arrival Restrictions Weight Bearing Restrictions: Yes LLE Weight Bearing: Non weight bearing    Mobility  Bed Mobility Overal bed mobility: Needs Assistance Bed Mobility: Supine to Sit;Sit to Supine;Rolling Rolling: Mod assist   Supine to sit: Mod assist;HOB elevated;+2 for physical assistance     General bed mobility comments: modA for rolling and max A for pericare, assistance to bring L LE to EOB and to elevate trunk into sitting; pad scoot of hips to EoB   Transfers Overall transfer level: Needs assistance Equipment used: 2 person hand held assist Transfers: Sit to/from Stand Sit to Stand: +2 physical assistance;Max assist   Squat pivot transfers: Total assist;+2 physical assistance     General transfer comment: assistance required to power up into standing and to hold L LE up to maintain NWB status, total Ax2 to pivot to  chair  Ambulation/Gait             General Gait Details: pt unable to advance ambulation        Balance Overall balance assessment: Needs assistance Sitting-balance support: No upper extremity supported;Feet supported Sitting balance-Leahy Scale: Fair   Postural control: Posterior lean Standing balance support: Bilateral upper extremity supported;During functional activity Standing balance-Leahy Scale: Poor                              Cognition Arousal/Alertness: Awake/alert Behavior During Therapy: WFL for tasks assessed/performed Overall Cognitive Status: History of cognitive impairments - at baseline                                 General Comments: Baseline dementia; pt is pleasant and participatory         General Comments General comments (skin integrity, edema, etc.): Pt found in bed incontinent of stool and urine,       Pertinent Vitals/Pain Pain Assessment: Faces Faces Pain Scale: Hurts little more Pain Location: left leg Pain Descriptors / Indicators: Operative site guarding;Sore;Grimacing Pain Intervention(s): Limited activity within patient's tolerance;Monitored during session;Repositioned           PT Goals (current goals can now be found in the care plan section) Acute Rehab PT Goals Patient Stated Goal: "Walk again" Progress towards PT goals: PT to reassess next treatment    Frequency    Min 2X/week      PT  Plan Current plan remains appropriate       AM-PAC PT "6 Clicks" Mobility   Outcome Measure  Help needed turning from your back to your side while in a flat bed without using bedrails?: A Lot Help needed moving from lying on your back to sitting on the side of a flat bed without using bedrails?: A Lot Help needed moving to and from a bed to a chair (including a wheelchair)?: A Lot Help needed standing up from a chair using your arms (e.g., wheelchair or bedside chair)?: A Lot Help needed to walk in  hospital room?: Total Help needed climbing 3-5 steps with a railing? : Total 6 Click Score: 10    End of Session Equipment Utilized During Treatment: Gait belt Activity Tolerance: Patient tolerated treatment well Patient left: with call bell/phone within reach;in bed Nurse Communication: Mobility status PT Visit Diagnosis: Unsteadiness on feet (R26.81);History of falling (Z91.81);Difficulty in walking, not elsewhere classified (R26.2);Pain Pain - Right/Left: Left Pain - part of body: Hip     Time: 1142-1207 PT Time Calculation (min) (ACUTE ONLY): 25 min  Charges:  $Therapeutic Activity: 23-37 mins                     Cathern Tahir B. Migdalia Dk PT, DPT Acute Rehabilitation Services Pager [phone] Office [phone]    [place] 06/20/2018, 1:01 PM

## 2018-06-20 NOTE — Progress Notes (Signed)
PROGRESS NOTE    Veronica Phillips  RWE:[id number] DOB: 16-Mar-1923 DOA: 07/28/2017 PCP: Dorothyann Peng, NP   Brief Narrative: Veronica Phillips is a 84 y.o. femalewith medical history significant ofHTN, CKD stage III, breast cancer, frequent UTIs, orthostatic hypotension. She presented after falling at home and suffering a femur fracture. She is s/p ORIF.    Assessment & Plan:   Principal Problem:   Closed displaced supracondylar fracture of distal end of left femur without intracondylar extension (HCC) Active Problems:   Achalasia   Orthostatic syncope   Fall at home, initial encounter   Recurrent UTI   Acute metabolic encephalopathy   Pressure injury of skin   Left femur fracture Patient is s/p ORIF on 12/23. Plan for discharge to SNF when medically stable. -Orthopedic surgery recommendations: Lovenox  Acute blood loss anemia Iron deficiency anemia In setting of recent surgery and Lovenox treatment. No evidence of hematoma on bedside evaluation. Hemoglobin as low as 5.7. Baseline of 9-10. S/p 2 units of PRBC to date. Hemoglobin now stable -Continue iron supplementation  Orthostatic syncope PT recommending SNF  AKI on CKD stage 3 Creatinine baseline of 1.1. Creatinine of 1.43 on admission and currently down to 1.1.  Acute metabolic encephalopathy CT head obtained which was unremarkable. Appears resolved. Unknown etiology. Improved.  Pressure injury of the skin Mid-sacral, present on admission  Recurrent UTIs Completed prophylactic course. Urine culture with no growth.  Achalasia Chronic. Intermittent difficulties with food -Continue soft diet  GERD -Continue Protonix   DVT prophylaxis: Lovenox Code Status:   Code Status: Full Code Family Communication: Daughter at bedside Disposition Plan: Discharge to SNF when hemoglobin stable   Consultants:   Orthopedic surgery  Procedures:   12/23: ORIF  Antimicrobials:  Cefazolin  Ciprofloxacin     Subjective: Pain in her leg this morning that is improved with oral pain medication.  Objective: Vitals:   06/19/18 0558 06/19/18 1354 06/19/18 2107 06/20/18 0528  BP: (!) 148/73 (!) 117/57 138/74 (!) 141/74  Pulse: (!) 59 90 77 78  Resp:  16 16 16   Temp: 98.1 F (36.7 C) 98.4 F (36.9 C) 99.4 F (37.4 C) 99.3 F (37.4 C)  TempSrc: Oral Oral Oral Oral  SpO2: 90% 100% 96% 96%  Weight:      Height:        Intake/Output Summary (Last 24 hours) at 06/20/2018 1410 Last data filed at 06/19/2018 1700 Gross per 24 hour  Intake 120 ml  Output -  Net 120 ml   Filed Weights   06/17/18 2000  Weight: 46 kg    Examination:  General exam: Appears calm and comfortable Respiratory system: Clear to auscultation. Respiratory effort normal. Cardiovascular system: S1 & S2 heard, RRR. No murmurs, rubs, gallops or clicks. Gastrointestinal system: Abdomen is nondistended, soft and nontender. No organomegaly or masses felt. Normal bowel sounds heard. Central nervous system: Alert and oriented. No focal neurological deficits. Extremities: 1+ edema of left foot. Skin: No cyanosis. No rashes. No ecchymosis over incision site Psychiatry: Judgement and insight appear normal. Mood & affect appropriate.    Data Reviewed: I have personally reviewed following labs and imaging studies  CBC: Recent Labs  Lab 06/15/18 1728 06/17/18 0504 06/17/18 1633 06/18/18 0329 06/19/18 0250 06/19/18 1547 06/20/18 0127  WBC 7.7 10.7*  --  11.1* 9.5 8.2 7.7  NEUTROABS 6.3  --   --   --   --   --   --   HGB 10.3* 5.7* 9.1* 8.8* 7.8*  8.3* 8.8*  HCT 32.9* 18.0* 27.6* 25.4* 23.8* 24.7* 26.4*  MCV 95.1 92.3  --  86.7 87.2 89.2 89.5  PLT 236 159  --  170 169 203 973   Basic Metabolic Panel: Recent Labs  Lab 06/15/18 1728 06/17/18 0317 06/18/18 0329 06/19/18 1547  NA 143 139 140 140  K 3.8 3.8 3.9 3.8  CL 106 106 106 111  CO2 26 22 27 22   GLUCOSE 123* 173* 105* 97  BUN 19 25* 28* 24*   CREATININE 1.14* 1.43* 1.30* 1.10*  CALCIUM 8.4* 7.3* 7.5* 7.5*   GFR: Estimated Creatinine Clearance: 23.2 mL/min (A) (by C-G formula based on SCr of 1.1 mg/dL (H)). Liver Function Tests: Recent Labs  Lab 06/15/18 1728  AST 22  ALT 16  ALKPHOS 50  BILITOT 0.7  PROT 6.2*  ALBUMIN 3.5   No results for input(s): LIPASE, AMYLASE in the last 168 hours. No results for input(s): AMMONIA in the last 168 hours. Coagulation Profile: No results for input(s): INR, PROTIME in the last 168 hours. Cardiac Enzymes: No results for input(s): CKTOTAL, CKMB, CKMBINDEX, TROPONINI in the last 168 hours. BNP (last 3 results) No results for input(s): PROBNP in the last 8760 hours. HbA1C: No results for input(s): HGBA1C in the last 72 hours. CBG: No results for input(s): GLUCAP in the last 168 hours. Lipid Profile: No results for input(s): CHOL, HDL, LDLCALC, TRIG, CHOLHDL, LDLDIRECT in the last 72 hours. Thyroid Function Tests: No results for input(s): TSH, T4TOTAL, FREET4, T3FREE, THYROIDAB in the last 72 hours. Anemia Panel: No results for input(s): VITAMINB12, FOLATE, FERRITIN, TIBC, IRON, RETICCTPCT in the last 72 hours. Sepsis Labs: No results for input(s): PROCALCITON, LATICACIDVEN in the last 168 hours.  Recent Results (from the past 240 hour(s))  Culture, Urine     Status: None   Collection Time: 06/15/18 11:08 PM  Result Value Ref Range Status   Specimen Description URINE, CATHETERIZED  Final   Special Requests NONE  Final   Culture   Final    NO GROWTH Performed at [facility], [address] [place], [place] [address]    Report Status 06/17/2018 FINAL  Final  MRSA PCR Screening     Status: None   Collection Time: 06/16/18  9:00 AM  Result Value Ref Range Status   MRSA by PCR NEGATIVE NEGATIVE Final    Comment:        The GeneXpert MRSA Assay (FDA approved for NASAL specimens only), is one component of a comprehensive MRSA colonization surveillance program. It is  not intended to diagnose MRSA infection nor to guide or monitor treatment for MRSA infections. Performed at [facility], [place] [address], [place], [place] [address]          Radiology Studies: No results found.      Scheduled Meds: . docusate  100 mg Oral BID  . enoxaparin (LOVENOX) injection  30 mg Subcutaneous Q24H  . lactose free nutrition  237 mL Oral TID WC  . latanoprost  1 drop Both Eyes QHS  . pantoprazole  40 mg Oral Daily  . potassium chloride  10 mEq Oral Daily   Continuous Infusions: . sodium chloride 75 mL/hr at 06/16/18 1900  . lactated ringers 10 mL/hr at 06/16/18 1244  . methocarbamol (ROBAXIN) IV       LOS: 5 days     Cordelia Poche, MD [facility] 06/20/2018, 2:10 PM  If 7PM-7AM, please contact night-coverage [email or web address]

## 2018-06-21 NOTE — Progress Notes (Signed)
PROGRESS NOTE    Veronica Phillips  NTI:[id number] DOB: Oct 31, 1922 DOA: 23-Feb-202019 PCP: Dorothyann Peng, NP   Brief Narrative: Veronica Phillips is a 84 y.o. femalewith medical history significant ofHTN, CKD stage III, breast cancer, frequent UTIs, orthostatic hypotension. She presented after falling at home and suffering a femur fracture. She is s/p ORIF.    Assessment & Plan:   Principal Problem:   Closed displaced supracondylar fracture of distal end of left femur without intracondylar extension (HCC) Active Problems:   Achalasia   Orthostatic syncope   Fall at home, initial encounter   Recurrent UTI   Acute metabolic encephalopathy   Pressure injury of skin   Left femur fracture Patient is s/p ORIF on 12/23. Plan for discharge to SNF when medically stable. -Orthopedic surgery recommendations: Lovenox (renally dosed)  Acute blood loss anemia Iron deficiency anemia In setting of recent surgery and Lovenox treatment. No evidence of hematoma on bedside evaluation. Hemoglobin as low as 5.7. Baseline of 9-10. S/p 2 units of PRBC to date. Hemoglobin now stable. -Continue iron supplementation  Orthostatic syncope PT recommending SNF  AKI on CKD stage 3 Creatinine baseline of 1.1. Creatinine of 1.43 on admission and currently down to 1.1.  Acute metabolic encephalopathy CT head obtained which was unremarkable. Appears resolved. Unknown etiology. Improved.  Pressure injury of the skin Mid-sacral, present on admission  Recurrent UTIs Completed prophylactic course. Urine culture with no growth.  Achalasia Chronic. Intermittent difficulties with food -Continue soft diet  GERD -Continue Protonix   DVT prophylaxis: Lovenox Code Status:   Code Status: Full Code Family Communication: None at bedside Disposition Plan: Discharge to SNF when bed available. Medically stable for discharge.   Consultants:   Orthopedic surgery  Procedures:   12/23:  ORIF  Antimicrobials:  Cefazolin  Ciprofloxacin    Subjective: Pain improved today.  Objective: Vitals:   06/20/18 0528 06/20/18 1700 06/20/18 2032 06/21/18 0510  BP: (!) 141/74 140/60 119/60 (!) 146/81  Pulse: 78 80 86 75  Resp: 16  16 15   Temp: 99.3 F (37.4 C) 98.8 F (37.1 C) 98.4 F (36.9 C) 98.3 F (36.8 C)  TempSrc: Oral Oral Oral Oral  SpO2: 96% 98% 100% 97%  Weight:      Height:       No intake or output data in the 24 hours ending 06/21/18 0918 Filed Weights   06/17/18 2000  Weight: 46 kg    Examination:  General: Well appearing, no distress Extremities: 3+ pitting edema of left foot   Data Reviewed: I have personally reviewed following labs and imaging studies  CBC: Recent Labs  Lab 06/15/18 1728 06/17/18 0504 06/17/18 1633 06/18/18 0329 06/19/18 0250 06/19/18 1547 06/20/18 0127  WBC 7.7 10.7*  --  11.1* 9.5 8.2 7.7  NEUTROABS 6.3  --   --   --   --   --   --   HGB 10.3* 5.7* 9.1* 8.8* 7.8* 8.3* 8.8*  HCT 32.9* 18.0* 27.6* 25.4* 23.8* 24.7* 26.4*  MCV 95.1 92.3  --  86.7 87.2 89.2 89.5  PLT 236 159  --  170 169 203 867   Basic Metabolic Panel: Recent Labs  Lab 06/15/18 1728 06/17/18 0317 06/18/18 0329 06/19/18 1547  NA 143 139 140 140  K 3.8 3.8 3.9 3.8  CL 106 106 106 111  CO2 26 22 27 22   GLUCOSE 123* 173* 105* 97  BUN 19 25* 28* 24*  CREATININE 1.14* 1.43* 1.30* 1.10*  CALCIUM 8.4*  7.3* 7.5* 7.5*   GFR: Estimated Creatinine Clearance: 23.2 mL/min (A) (by C-G formula based on SCr of 1.1 mg/dL (H)). Liver Function Tests: Recent Labs  Lab 06/15/18 1728  AST 22  ALT 16  ALKPHOS 50  BILITOT 0.7  PROT 6.2*  ALBUMIN 3.5   No results for input(s): LIPASE, AMYLASE in the last 168 hours. No results for input(s): AMMONIA in the last 168 hours. Coagulation Profile: No results for input(s): INR, PROTIME in the last 168 hours. Cardiac Enzymes: No results for input(s): CKTOTAL, CKMB, CKMBINDEX, TROPONINI in the last 168  hours. BNP (last 3 results) No results for input(s): PROBNP in the last 8760 hours. HbA1C: No results for input(s): HGBA1C in the last 72 hours. CBG: No results for input(s): GLUCAP in the last 168 hours. Lipid Profile: No results for input(s): CHOL, HDL, LDLCALC, TRIG, CHOLHDL, LDLDIRECT in the last 72 hours. Thyroid Function Tests: No results for input(s): TSH, T4TOTAL, FREET4, T3FREE, THYROIDAB in the last 72 hours. Anemia Panel: No results for input(s): VITAMINB12, FOLATE, FERRITIN, TIBC, IRON, RETICCTPCT in the last 72 hours. Sepsis Labs: No results for input(s): PROCALCITON, LATICACIDVEN in the last 168 hours.  Recent Results (from the past 240 hour(s))  Culture, Urine     Status: None   Collection Time: 06/15/18 11:08 PM  Result Value Ref Range Status   Specimen Description URINE, CATHETERIZED  Final   Special Requests NONE  Final   Culture   Final    NO GROWTH Performed at [facility], [address], [place], [place] [address]    Report Status 06/17/2018 FINAL  Final  MRSA PCR Screening     Status: None   Collection Time: 06/16/18  9:00 AM  Result Value Ref Range Status   MRSA by PCR NEGATIVE NEGATIVE Final    Comment:        The GeneXpert MRSA Assay (FDA approved for NASAL specimens only), is one component of a comprehensive MRSA colonization surveillance program. It is not intended to diagnose MRSA infection nor to guide or monitor treatment for MRSA infections. Performed at [facility], [place] [address] [place], [place] [address]          Radiology Studies: No results found.      Scheduled Meds: . docusate  100 mg Oral BID  . enoxaparin (LOVENOX) injection  30 mg Subcutaneous Q24H  . lactose free nutrition  237 mL Oral TID WC  . latanoprost  1 drop Both Eyes QHS  . pantoprazole  40 mg Oral Daily  . potassium chloride  10 mEq Oral Daily   Continuous Infusions: . sodium chloride 75 mL/hr at 06/16/18 1900  . lactated ringers 10  mL/hr at 06/16/18 1244  . methocarbamol (ROBAXIN) IV       LOS: 6 days     Cordelia Poche, MD [facility] 06/21/2018, 9:18 AM  If 7PM-7AM, please contact night-coverage [email or web address]

## 2018-06-22 MED ORDER — FERROUS GLUCONATE 324 (38 FE) MG PO TABS
325.0000 mg | ORAL_TABLET | Freq: Every day | ORAL | Status: DC
  Administered 2018-06-22: 325 mg via ORAL

## 2018-06-22 NOTE — Plan of Care (Signed)
  Problem: Pain Managment: Goal: General experience of comfort will improve Outcome: Progressing   Problem: Safety: Goal: Ability to remain free from injury will improve Outcome: Progressing   Problem: Skin Integrity: Goal: Risk for impaired skin integrity will decrease Outcome: Progressing   

## 2018-06-22 NOTE — Progress Notes (Signed)
PROGRESS NOTE    Veronica Phillips  VOH:[id number] DOB: June 28, 1922 DOA: 02-07-202019 PCP: Dorothyann Peng, NP   Brief Narrative: Veronica Phillips is a 84 y.o. femalewith medical history significant ofHTN, CKD stage III, breast cancer, frequent UTIs, orthostatic hypotension. She presented after falling at home and suffering a femur fracture. She is s/p ORIF.    Assessment & Plan:   Principal Problem:   Closed displaced supracondylar fracture of distal end of left femur without intracondylar extension (HCC) Active Problems:   Achalasia   Orthostatic syncope   Fall at home, initial encounter   Recurrent UTI   Acute metabolic encephalopathy   Pressure injury of skin   Left femur fracture Patient is s/p ORIF on 12/23. Plan for discharge to SNF when medically stable. -Orthopedic surgery recommendations: Lovenox (renally dosed)  Acute blood loss anemia Iron deficiency anemia In setting of recent surgery and Lovenox treatment. No evidence of hematoma on bedside evaluation. Hemoglobin as low as 5.7. Baseline of 9-10. S/p 2 units of PRBC to date. Hemoglobin now stable. -Continue iron supplementation  Orthostatic syncope PT recommending SNF  AKI on CKD stage 3 Creatinine baseline of 1.1. Creatinine of 1.43 on admission and currently down to 1.1.  Acute metabolic encephalopathy CT head obtained which was unremarkable. Appears resolved. Unknown etiology. Improved.  Pressure injury of the skin Mid-sacral, present on admission  Recurrent UTIs Completed prophylactic course. Urine culture with no growth.  Achalasia Chronic. Intermittent difficulties with food -Continue soft diet  GERD -Continue Protonix   DVT prophylaxis: Lovenox Code Status:   Code Status: Full Code Family Communication: None at bedside Disposition Plan: Discharge to SNF when bed available. Medically stable for discharge.   Consultants:   Orthopedic surgery  Procedures:   12/23:  ORIF  Antimicrobials:  Cefazolin  Ciprofloxacin    Subjective: Wants more salt in her food. Leg is doing alright.  Objective: Vitals:   06/21/18 0510 06/21/18 1324 06/21/18 2027 06/22/18 0500  BP: (!) 146/81 128/77 (!) 178/87 (!) 149/84  Pulse: 75 76 74 80  Resp: 15 17 14 14   Temp: 98.3 F (36.8 C) 98.9 F (37.2 C) 98.2 F (36.8 C) 98.3 F (36.8 C)  TempSrc: Oral Oral Oral Oral  SpO2: 97% 97% 98% 100%  Weight:      Height:        Intake/Output Summary (Last 24 hours) at 06/22/2018 0926 Last data filed at 06/21/2018 1700 Gross per 24 hour  Intake 240 ml  Output 300 ml  Net -60 ml   Filed Weights   06/17/18 2000  Weight: 46 kg    Examination:  General: Well appearing, no distress   Data Reviewed: I have personally reviewed following labs and imaging studies  CBC: Recent Labs  Lab 06/15/18 1728 06/17/18 0504 06/17/18 1633 06/18/18 0329 06/19/18 0250 06/19/18 1547 06/20/18 0127  WBC 7.7 10.7*  --  11.1* 9.5 8.2 7.7  NEUTROABS 6.3  --   --   --   --   --   --   HGB 10.3* 5.7* 9.1* 8.8* 7.8* 8.3* 8.8*  HCT 32.9* 18.0* 27.6* 25.4* 23.8* 24.7* 26.4*  MCV 95.1 92.3  --  86.7 87.2 89.2 89.5  PLT 236 159  --  170 169 203 694   Basic Metabolic Panel: Recent Labs  Lab 06/15/18 1728 06/17/18 0317 06/18/18 0329 06/19/18 1547  NA 143 139 140 140  K 3.8 3.8 3.9 3.8  CL 106 106 106 111  CO2 26 22 27  22  GLUCOSE 123* 173* 105* 97  BUN 19 25* 28* 24*  CREATININE 1.14* 1.43* 1.30* 1.10*  CALCIUM 8.4* 7.3* 7.5* 7.5*   GFR: Estimated Creatinine Clearance: 23.2 mL/min (A) (by C-G formula based on SCr of 1.1 mg/dL (H)). Liver Function Tests: Recent Labs  Lab 06/15/18 1728  AST 22  ALT 16  ALKPHOS 50  BILITOT 0.7  PROT 6.2*  ALBUMIN 3.5   No results for input(s): LIPASE, AMYLASE in the last 168 hours. No results for input(s): AMMONIA in the last 168 hours. Coagulation Profile: No results for input(s): INR, PROTIME in the last 168 hours. Cardiac  Enzymes: No results for input(s): CKTOTAL, CKMB, CKMBINDEX, TROPONINI in the last 168 hours. BNP (last 3 results) No results for input(s): PROBNP in the last 8760 hours. HbA1C: No results for input(s): HGBA1C in the last 72 hours. CBG: No results for input(s): GLUCAP in the last 168 hours. Lipid Profile: No results for input(s): CHOL, HDL, LDLCALC, TRIG, CHOLHDL, LDLDIRECT in the last 72 hours. Thyroid Function Tests: No results for input(s): TSH, T4TOTAL, FREET4, T3FREE, THYROIDAB in the last 72 hours. Anemia Panel: No results for input(s): VITAMINB12, FOLATE, FERRITIN, TIBC, IRON, RETICCTPCT in the last 72 hours. Sepsis Labs: No results for input(s): PROCALCITON, LATICACIDVEN in the last 168 hours.  Recent Results (from the past 240 hour(s))  Culture, Urine     Status: None   Collection Time: 06/15/18 11:08 PM  Result Value Ref Range Status   Specimen Description URINE, CATHETERIZED  Final   Special Requests NONE  Final   Culture   Final    NO GROWTH Performed at [facility], [address] [place], [place] [address]    Report Status 06/17/2018 FINAL  Final  MRSA PCR Screening     Status: None   Collection Time: 06/16/18  9:00 AM  Result Value Ref Range Status   MRSA by PCR NEGATIVE NEGATIVE Final    Comment:        The GeneXpert MRSA Assay (FDA approved for NASAL specimens only), is one component of a comprehensive MRSA colonization surveillance program. It is not intended to diagnose MRSA infection nor to guide or monitor treatment for MRSA infections. Performed at [facility], [place] [address] [place], [place] [address]          Radiology Studies: No results found.      Scheduled Meds: . docusate  100 mg Oral BID  . enoxaparin (LOVENOX) injection  30 mg Subcutaneous Q24H  . lactose free nutrition  237 mL Oral TID WC  . latanoprost  1 drop Both Eyes QHS  . pantoprazole  40 mg Oral Daily  . potassium chloride  10 mEq Oral Daily    Continuous Infusions: . sodium chloride 75 mL/hr at 06/16/18 1900  . lactated ringers 10 mL/hr at 06/16/18 1244  . methocarbamol (ROBAXIN) IV       LOS: 7 days     Cordelia Poche, MD [facility] 06/22/2018, 9:26 AM  If 7PM-7AM, please contact night-coverage [email or web address]

## 2018-06-23 DIAGNOSIS — L89150 Pressure ulcer of sacral region, unstageable: Secondary | ICD-10-CM | POA: Diagnosis not present

## 2018-06-23 DIAGNOSIS — K219 Gastro-esophageal reflux disease without esophagitis: Secondary | ICD-10-CM | POA: Diagnosis not present

## 2018-06-23 DIAGNOSIS — N390 Urinary tract infection, site not specified: Secondary | ICD-10-CM | POA: Diagnosis not present

## 2018-06-23 DIAGNOSIS — K220 Achalasia of cardia: Secondary | ICD-10-CM | POA: Diagnosis not present

## 2018-06-23 DIAGNOSIS — E441 Mild protein-calorie malnutrition: Secondary | ICD-10-CM | POA: Diagnosis not present

## 2018-06-23 DIAGNOSIS — R2689 Other abnormalities of gait and mobility: Secondary | ICD-10-CM | POA: Diagnosis not present

## 2018-06-23 DIAGNOSIS — Z87440 Personal history of urinary (tract) infections: Secondary | ICD-10-CM | POA: Diagnosis not present

## 2018-06-23 DIAGNOSIS — I951 Orthostatic hypotension: Secondary | ICD-10-CM | POA: Diagnosis not present

## 2018-06-23 DIAGNOSIS — Z9181 History of falling: Secondary | ICD-10-CM | POA: Diagnosis not present

## 2018-06-23 DIAGNOSIS — K5900 Constipation, unspecified: Secondary | ICD-10-CM | POA: Diagnosis not present

## 2018-06-23 DIAGNOSIS — G309 Alzheimer's disease, unspecified: Secondary | ICD-10-CM | POA: Diagnosis not present

## 2018-06-23 DIAGNOSIS — Z4789 Encounter for other orthopedic aftercare: Secondary | ICD-10-CM | POA: Diagnosis not present

## 2018-06-23 DIAGNOSIS — G9341 Metabolic encephalopathy: Secondary | ICD-10-CM | POA: Diagnosis not present

## 2018-06-23 DIAGNOSIS — Z7401 Bed confinement status: Secondary | ICD-10-CM | POA: Diagnosis not present

## 2018-06-23 DIAGNOSIS — R531 Weakness: Secondary | ICD-10-CM | POA: Diagnosis not present

## 2018-06-23 DIAGNOSIS — S79929A Unspecified injury of unspecified thigh, initial encounter: Secondary | ICD-10-CM | POA: Diagnosis not present

## 2018-06-23 DIAGNOSIS — M25552 Pain in left hip: Secondary | ICD-10-CM | POA: Diagnosis not present

## 2018-06-23 DIAGNOSIS — S72452A Displaced supracondylar fracture without intracondylar extension of lower end of left femur, initial encounter for closed fracture: Secondary | ICD-10-CM | POA: Diagnosis not present

## 2018-06-23 DIAGNOSIS — M2550 Pain in unspecified joint: Secondary | ICD-10-CM | POA: Diagnosis not present

## 2018-06-23 DIAGNOSIS — S72452D Displaced supracondylar fracture without intracondylar extension of lower end of left femur, subsequent encounter for closed fracture with routine healing: Secondary | ICD-10-CM | POA: Diagnosis not present

## 2018-06-23 DIAGNOSIS — L89152 Pressure ulcer of sacral region, stage 2: Secondary | ICD-10-CM | POA: Diagnosis not present

## 2018-06-23 MED ORDER — BOOST PLUS PO LIQD: 237.0000 mL | Freq: Three times a day (TID) | ORAL | 0 refills | Status: DC

## 2018-06-23 MED ORDER — POLYETHYLENE GLYCOL 3350 17 G PO PACK: 17.0000 g | PACK | Freq: Every day | ORAL | 0 refills | Status: DC | PRN

## 2018-06-23 MED ORDER — DOCUSATE SODIUM 50 MG/5ML PO LIQD: 100.0000 mg | Freq: Two times a day (BID) | ORAL | Status: DC

## 2018-06-23 NOTE — Progress Notes (Signed)
Pt transported from facility via EMS. All personal belongings taken with pt. AVS sent with EMS as well as hard scripts

## 2018-06-23 NOTE — Progress Notes (Addendum)
Patient will DC to: [facility] Anticipated DC date: 06/23/18 Family notified:Jean Transport by: Corey Harold  Per MD patient ready for DC to [facility]. RN, patient, patient's family, and facility notified of DC. Discharge Summary sent to facility. RN given number for report [phone] Room 310A. DC packet on chart. Ambulance transport requested for patient.  CSW signing off.  [place], [place]

## 2018-06-23 NOTE — Clinical Social Work Placement (Signed)
   CLINICAL SOCIAL WORK PLACEMENT  NOTE  Date:  06/23/2018  Patient Details  Name: Veronica Phillips MRN: [id number] Date of Birth: August 03, 1922  Clinical Social Work is seeking post-discharge placement for this patient at the [place] level of care (*CSW will initial, date and re-position this form in  chart as items are completed):      Patient/family provided with [facility] Work Department's list of facilities offering this level of care within the geographic area requested by the patient (or if unable, by the patient's family).  Yes   Patient/family informed of their freedom to choose among providers that offer the needed level of care, that participate in Medicare, Medicaid or managed care program needed by the patient, have an available bed and are willing to accept the patient.      Patient/family informed of [facility]'s ownership interest in [facility] and [facility], as well as of the fact that they are under no obligation to receive care at these facilities.  PASRR submitted to EDS on       PASRR number received on 06/20/18     Existing PASRR number confirmed on       FL2 transmitted to all facilities in geographic area requested by pt/family on 06/20/18     FL2 transmitted to all facilities within larger geographic area on       Patient informed that his/her managed care company has contracts with or will negotiate with certain facilities, including the following:        Yes   Patient/family informed of bed offers received.  Patient chooses bed at [facility], [facility]     Physician recommends and patient chooses bed at      Patient to be transferred to [facility] on 06/23/18.  Patient to be transferred to facility by [facility]     Patient family notified on 06/23/18 of transfer.  Name of family member notified:  [name] (daughter)      PHYSICIAN       Additional Comment:     _______________________________________________ Alberteen Sam, LCSW 06/23/2018, 12:10 PM

## 2018-06-23 NOTE — Progress Notes (Signed)
Physical Therapy Treatment Patient Details Name: Veronica Phillips MRN: [id number] DOB: December 01, 1922 Today's Date: 06/23/2018    History of Present Illness Veronica Phillips is a 84 y.o. female with medical history significant of HTN, CKD stage III, breast cancer, current UTIs with Klebsiella pneumonia, and frequent falls related to orthostatic hypotension; who presents after having a unwitnessed fall at home. Pt sustained a L distal femur fx and underwent IM nail 06/16/18.     PT Comments    Patient continues to require +2 assist for all functional transfers and cues throughout session to maintain NWB status. Continue to progress as tolerated with anticipated d/c to SNF for further skilled PT services.     Follow Up Recommendations  SNF;Supervision for mobility/OOB     Equipment Recommendations  None recommended by PT    Recommendations for Other Services       Precautions / Restrictions Precautions Precautions: Fall Restrictions Weight Bearing Restrictions: Yes LLE Weight Bearing: Non weight bearing    Mobility  Bed Mobility Overal bed mobility: Needs Assistance Bed Mobility: Supine to Sit     Supine to sit: HOB elevated;Min guard     General bed mobility comments: min guard for safety; HOB elevated   Transfers Overall transfer level: Needs assistance Equipment used: Rolling walker (2 wheeled) Transfers: Stand Pivot Transfers   Stand pivot transfers: Max assist;+2 physical assistance       General transfer comment: pt stood for EOB and BSC; cues for NWB status thoughout and L LE elevated by therapist during transers; pt attempting to use L LE to take steps if not elevated; cues for sequencing, safe use of AD, and pivoting R foot and assist for balance and guiding RW  Ambulation/Gait                 Stairs             Wheelchair Mobility    Modified Rankin (Stroke Patients Only)       Balance Overall balance assessment: Needs  assistance Sitting-balance support: No upper extremity supported;Feet supported Sitting balance-Leahy Scale: Fair     Standing balance support: Bilateral upper extremity supported;During functional activity Standing balance-Leahy Scale: Poor                              Cognition Arousal/Alertness: Awake/alert Behavior During Therapy: WFL for tasks assessed/performed Overall Cognitive Status: History of cognitive impairments - at baseline                                 General Comments: Baseline dementia; pt demonstrates limited safety awareness this session and attempting to stand without assistance      Exercises      General Comments        Pertinent Vitals/Pain Pain Assessment: Faces Faces Pain Scale: Hurts a little bit Pain Location: left leg Pain Descriptors / Indicators: Sore;Guarding Pain Intervention(s): Monitored during session;Repositioned    Home Living                      Prior Function            PT Goals (current goals can now be found in the care plan section) Progress towards PT goals: Progressing toward goals    Frequency    Min 2X/week      PT Plan Current plan remains appropriate  Co-evaluation              AM-PAC PT "6 Clicks" Mobility   Outcome Measure  Help needed turning from your back to your side while in a flat bed without using bedrails?: A Lot Help needed moving from lying on your back to sitting on the side of a flat bed without using bedrails?: A Lot Help needed moving to and from a bed to a chair (including a wheelchair)?: A Lot Help needed standing up from a chair using your arms (e.g., wheelchair or bedside chair)?: A Lot Help needed to walk in hospital room?: Total Help needed climbing 3-5 steps with a railing? : Total 6 Click Score: 10    End of Session Equipment Utilized During Treatment: Gait belt Activity Tolerance: Patient tolerated treatment well Patient left: with  call bell/phone within reach;in chair;with chair alarm set Nurse Communication: Mobility status PT Visit Diagnosis: Unsteadiness on feet (R26.81);History of falling (Z91.81);Difficulty in walking, not elsewhere classified (R26.2);Pain Pain - Right/Left: Left Pain - part of body: Hip     Time: 1151-1205 PT Time Calculation (min) (ACUTE ONLY): 14 min  Charges:  $Therapeutic Activity: 8-22 mins                     Earney Navy, PTA Acute Rehabilitation Services Pager: [phone] Office: [phone]     Darliss Cheney 06/23/2018, 2:07 PM

## 2018-06-23 NOTE — Discharge Summary (Signed)
Physician Discharge Summary  Veronica Phillips:[id number] DOB: 1922/07/25 DOA: May 26, 202019  PCP: Veronica Peng, NP  Admit date: May 26, 202019 Discharge date: 06/23/2018  Admitted From: Home Disposition: SNF  Recommendations for Outpatient Follow-up:  1. Follow up with orthopedic surgery in 2 weeks for post op visit 2. Please obtain BMP/CBC in one week 3. Non-weight bearing: Left leg 4. Please follow up on the following pending results: None  Home Health: SNF Equipment/Devices: None  Discharge Condition: Stable CODE STATUS: Full code Diet recommendation: Soft diet   Brief/Interim Summary:  Admission HPI written by Norval Morton, MD   HPI: Veronica Phillips is a 84 y.o. female with medical history significant of HTN, CKD stage III, breast cancer, current UTIs with Klebsiella pneumonia, and frequent falls related to orthostatic hypotension; who presents after having a unwitnessed fall at home.  Patient and family help provide history.  Patient lives at home with daughter and normally ambulates with the use of a walker or in a wheelchair. The patient's daughter had been out the house, and patient reports hearing a noise for which she got up and went to the front door.  Family notes that she normally does not get up without assistance, but has been more confused intermittently.  They relate symptoms to her having frequent UTIs for which she is currently on a 90-day course of ciprofloxacin.  Patient reports that she hit her head on concrete porch.  It is unclear if she lost consciousness, but family believes that she likely lost consciousness.  He makes note that patient haslosing some weight, intermittently complains of some mild shortness of breath, is on a soft diet due to history of achalasia, intermittent nausea, and vomiting.  ED Course: Upon admission intolerance department patient was noted to be afebrile, respirations 18-23, and all other vital signs maintained.  Labs revealed  WBC 7.7, hemoglobin 10.3, BUN 19, and creatinine 1.14.  X-rays revealed mildly comminuted and displaced distal femoral fracture.  Dr. Ninfa Linden of Orthopedics was consulted and recommended transfer to [facility] for possible surgery.  Patient was placed in a knee immobilizer and given fentanyl for pain. [facility] called to admit.   Hospital course:  Left femur fracture Patient is s/p ORIF on 12/23. Plan for discharge to SNF when medically stable. Orthopedic surgery recommendations: Lovenox (renally dosed) for DVT prophylaxis, non-weight bearing of left leg, outpatient follow-up. Please continue bowel movement regimen while taking narcotics.  Acute blood loss anemia Iron deficiency anemia In setting of recent surgery and Lovenox treatment. No evidence of hematoma on bedside evaluation. Hemoglobin as low as 5.7. Baseline of 9-10. S/p 2 units of PRBC to date. Hemoglobin now stable. Resume supplementation on discharge.  Orthostatic syncope PT recommending SNF  AKI on CKD stage 3 Creatinine baseline of 1.1. Creatinine of 1.43 on admission and currently down to 1.1.  Acute metabolic encephalopathy CT head obtained which was unremarkable. Appears resolved. Unknown etiology. Improved.  Pressure injury of the skin Mid-sacral, present on admission  Recurrent UTIs Completed prophylactic course. Urine culture with no growth.  Achalasia Chronic. Intermittent difficulties with food. Continue soft diet.  GERD Protonix inpatient. Continue home PPI on discharge.  Discharge Diagnoses:  Principal Problem:   Closed displaced supracondylar fracture of distal end of left femur without intracondylar extension (HCC) Active Problems:   Achalasia   Orthostatic syncope   Fall at home, initial encounter   Recurrent UTI   Acute metabolic encephalopathy   Pressure injury of skin  Discharge Instructions  Discharge Instructions    Call MD for:  redness, tenderness, or signs of infection (pain,  swelling, redness, odor or green/yellow discharge around incision site)   Complete by:  As directed    Call MD for:  severe uncontrolled pain   Complete by:  As directed    Increase activity slowly   Complete by:  As directed    Non weight bearing   Complete by:  As directed      Allergies as of 06/23/2018      Reactions   Septra [bactrim] Swelling   Site of swelling not recalled by the patient   Lactose Intolerance (gi) Diarrhea   Sulfa Antibiotics Swelling   Site of swelling not recalled by the patient   Tape Other (See Comments)   Skin is sensitive; please use paper tape!!      Medication List    STOP taking these medications   ciprofloxacin 250 MG tablet Commonly known as:  CIPRO   fluticasone 50 MCG/ACT nasal spray Commonly known as:  FLONASE     TAKE these medications   acetaminophen 500 MG tablet Commonly known as:  TYLENOL Take 500 mg by mouth every 6 (six) hours as needed (for pain).   BIOTIN PO Take 1 tablet by mouth every evening.   docusate 50 MG/5ML liquid Commonly known as:  COLACE Take 10 mLs (100 mg total) by mouth 2 (two) times daily.   enoxaparin 30 MG/0.3ML injection Commonly known as:  LOVENOX Inject 0.3 mLs (30 mg total) into the skin daily.   ferrous gluconate 325 MG tablet Commonly known as:  FERGON Take 325 mg by mouth daily after supper.   lactose free nutrition Liqd Take 237 mLs by mouth 3 (three) times daily with meals. What changed:    how much to take  when to take this  Another medication with the same name was removed. Continue taking this medication, and follow the directions you see here.   latanoprost 0.005 % ophthalmic solution Commonly known as:  XALATAN Place 1 drop into both eyes at bedtime.   omeprazole 20 MG capsule Commonly known as:  PRILOSEC Take 1 capsule (20 mg total) by mouth daily.   ONE-A-DAY WOMENS 50 PLUS PO Take 1 tablet by mouth daily after supper.   multivitamin-lutein Caps capsule Take 1  capsule by mouth daily.   oxyCODONE 5 MG immediate release tablet Commonly known as:  Oxy IR/ROXICODONE Take 1-3 tablets (5-15 mg total) by mouth every 4 (four) hours as needed.   polyethylene glycol packet Commonly known as:  MIRALAX / GLYCOLAX Take 17 g by mouth daily as needed for mild constipation.   potassium chloride 10 MEQ tablet Commonly known as:  K-DUR Take 1 tablet (10 mEq total) by mouth daily.   VITAMIN B-12 PO Take 1 tablet by mouth at bedtime.   VITAMIN C PO Take 1 tablet by mouth at bedtime.   vitamin E 400 UNIT capsule Take 400 Units by mouth at bedtime.            Discharge Care Instructions  (From admission, onward)         Start     Ordered   06/16/18 0000  Non weight bearing     06/16/18 1606          Contact information for follow-up providers    Leandrew Koyanagi, MD In 2 weeks.   Specialty:  Orthopedic Surgery Why:  For suture removal, For wound re-check  Contact information: [place] [id number] [phone]            Contact information for after-discharge care    Destination    [facility] Preferred SNF .   Service:  Skilled Nursing Contact information: [place] [phone]                 Allergies  Allergen Reactions  . Septra [Bactrim] Swelling    Site of swelling not recalled by the patient  . Lactose Intolerance (Gi) Diarrhea  . Sulfa Antibiotics Swelling    Site of swelling not recalled by the patient  . Tape Other (See Comments)    Skin is sensitive; please use paper tape!!    Consultations:  Orthopedic surgery   Procedures/Studies: Ct Abdomen Pelvis Wo Contrast  Result Date: 06/16/2018 CLINICAL DATA:  84 year old female with recurrent UTI. Initial encounter. EXAM: CT ABDOMEN AND PELVIS WITHOUT CONTRAST TECHNIQUE: Multidetector CT imaging of the abdomen and pelvis was performed following the standard  protocol without IV contrast. COMPARISON:  No comparison CT abdomen or pelvis. Comparison chest CT 09/28/2012. FINDINGS: Lower chest: Bibasilar atelectasis with minimal pleural effusion/pleural thickening. Cardiomegaly. Aortic valve calcification. Hepatobiliary: Taking into account limitation by non contrast imaging, no worrisome hepatic lesion. Right lobe liver small calcifications. No calcified gallstone or common bile duct stone. Limited for evaluating for gallbladder inflammation. Pancreas: Taking into account limitation by non contrast imaging, no worrisome pancreatic mass. Limited for evaluating for inflammation. Spleen: No splenic mass or enlargement. Adrenals/Urinary Tract: No obstructing stone or hydronephrosis. Left lower pole 1.7 cm nonobstructing stone. Taking into account limitation by non contrast imaging, no worrisome renal lesion. Hyperplasia adrenal glands. Foley catheter in place with decompressed urinary bladder. Stomach/Bowel: Evaluation for the possibility of extraluminal bowel inflammatory process is limited by lack of fat planes and third spacing of fluid. Diverticulosis throughout the colon. Fluid and secretion filled dilated esophagus. Vascular/Lymphatic: Atherosclerotic changes aorta and aortic branch vessels. No abdominal aortic aneurysm. Soft tissue surrounds the abdominal aorta. This may be partially explained by unopacified bowel but can not exclude retroperitoneal fibrosis or adenopathy. Reproductive: No obvious mass identified. Other: No free intraperitoneal air. Third spacing of fluid. No bowel containing hernia. Musculoskeletal: Anterior slip L3, L4 and L5 most notable L4 level (10 mm) secondary to facet degenerative changes. Disc space narrowing at each of these levels. No compression fracture. Degenerative changes lower thoracic spine. Question 9 cm mass or hematoma anterior left thigh. Bilateral hip joint degenerative changes. Bones appear osteopenic. No obvious fracture.  IMPRESSION: 1. No obstructing stone or hydronephrosis. Left lower pole 1.7 cm nonobstructing stone. 2. Evaluation for the possibility of extraluminal bowel inflammatory process is limited by lack of fat planes and third spacing of fluid. Diverticulosis throughout the colon. 3. Fluid and secretion filled dilated esophagus. 4. Question 9 cm mass or hematoma anterior left thigh. 5. Aortic Atherosclerosis (ICD10-I70.0). No abdominal aortic aneurysm. Soft tissue surrounds the abdominal aorta. This may be partially explained by unopacified bowel but can not exclude retroperitoneal fibrosis or adenopathy. 6. Degenerative changes thoracic spine, lumbar spine and both hips. Electronically Signed   By: Genia Del M.D.   On: 06/16/2018 14:00   Dg Chest 2 View  Result Date: 01/13/202019 CLINICAL DATA:  Left femur fracture.  Pre-op respiratory exam EXAM: CHEST - 2 VIEW COMPARISON:  01/25/2017 and CT on 09/28/2012 FINDINGS: Stable mild cardiomegaly. Aortic atherosclerosis. Right paratracheal mass is again seen, consistent with  dilated thoracic esophagus as demonstrated on previous CT. No evidence of pulmonary infiltrate or edema. No significant pleural effusion. Surgical clips again seen in left axilla. IMPRESSION: No active lung disease. Stable right paratracheal mass, consistent with dilated thoracic esophagus. Stable mild cardiomegaly. Electronically Signed   By: Earle Gell M.D.   On: 07-16-2017 18:28   Dg Knee 1-2 Views Left  Result Date: 07-16-2017 CLINICAL DATA:  Left femur fracture. EXAM: LEFT KNEE - 1-2 VIEW COMPARISON:  02/27/2018 FINDINGS: Bones are diffusely demineralized. Comminuted fracture of the distal femoral metaphysis evident although assessment limited by patient positioning. No definite evidence of fracture extension to the weightbearing articular surface. There is advanced tricompartmental degenerative change in the knee. IMPRESSION: Comminuted fracture of the distal femoral metaphysis without  definite extension to the weight-bearing articular surface of the femoral condyles. Osteopenia. Electronically Signed   By: Misty Stanley M.D.   On: 07-16-2017 18:25   Ct Knee Right Wo Contrast  Result Date: 06/16/2018 CLINICAL DATA:  Knee pain post falling. EXAM: CT OF THE RIGHT KNEE WITHOUT CONTRAST TECHNIQUE: Multidetector CT imaging of the right knee was performed according to the standard protocol. Multiplanar CT image reconstructions were also generated. COMPARISON:  Radiographs 07-16-2017 and 01/25/2017. FINDINGS: Bones/Joint/Cartilage The bones are diffusely demineralized. There is no evidence of acute fracture or dislocation. There are advanced tricompartmental degenerative changes with fragmented osteophytes, especially medially. There is meniscal chondrocalcinosis, a moderate-sized joint effusion and multiple small intra-articular loose bodies. Ligaments Suboptimally assessed by CT. Muscles and Tendons No focal muscular abnormalities are identified. The extensor mechanism is intact. Soft tissues There is a complex, septated Baker's cyst measuring up to 3.2 x 1.7 cm transverse and extending 6.5 cm in length. There are small calcifications within this Baker's cyst. There is prominent atherosclerosis within the popliteal and lower leg arteries. No acute vascular findings identified on noncontrast imaging. IMPRESSION: 1. No evidence of acute fracture or dislocation. 2. Advanced tricompartmental degenerative changes with fragmented osteophytes, loose bodies and a moderate size joint effusion. 3. Complex Baker's cyst. Electronically Signed   By: Richardean Sale M.D.   On: 06/16/2018 13:49   Dg Knee Complete 4 Views Right  Result Date: 07-16-2017 CLINICAL DATA:  Fall EXAM: RIGHT KNEE - COMPLETE 4+ VIEW COMPARISON:  01/25/2017 FINDINGS: No fracture or malalignment. Severe tricompartment arthritis with joint space narrowing, subarticular sclerosis and osteophyte formation. Small moderate knee effusion.  IMPRESSION: 1. No acute osseous abnormality. 2. Advanced arthritis of the knee with small moderate knee effusion Electronically Signed   By: Donavan Foil M.D.   On: 07-16-2017 18:26   Dg C-arm 1-60 Min  Result Date: 06/16/2018 CLINICAL DATA:  ORIF of the left femur. EXAM: DG C-ARM 61-120 MIN; LEFT FEMUR 2 VIEWS COMPARISON:  Left knee radiographs 07-16-2017 FLUOROSCOPY TIME:  Fluoroscopy Time:  2 minutes 27 seconds Number of Acquired Spot Images: 5 FINDINGS: A reverse IM nail is in place. For transverse screws are present at the level of fracture. The fracture is reduced. Knee is located. Two proximal interlocking screws are present. There is slight displacement of the cortex at the tip of the more distal of the 2 proximal interlocking screws. IMPRESSION: 1. Status post ORIF with reverse IM nail through the distal femur. 2. Near anatomic reduction. Electronically Signed   By: San Morelle M.D.   On: 06/16/2018 16:33   Dg Femur Min 2 Views Left  Result Date: 06/16/2018 CLINICAL DATA:  ORIF of the left femur. EXAM: DG C-ARM 61-120 MIN;  LEFT FEMUR 2 VIEWS COMPARISON:  Left knee radiographs 09/06/2017 FLUOROSCOPY TIME:  Fluoroscopy Time:  2 minutes 27 seconds Number of Acquired Spot Images: 5 FINDINGS: A reverse IM nail is in place. For transverse screws are present at the level of fracture. The fracture is reduced. Knee is located. Two proximal interlocking screws are present. There is slight displacement of the cortex at the tip of the more distal of the 2 proximal interlocking screws. IMPRESSION: 1. Status post ORIF with reverse IM nail through the distal femur. 2. Near anatomic reduction. Electronically Signed   By: San Morelle M.D.   On: 06/16/2018 16:33   Dg Femur Min 2 Views Left  Result Date: 09/06/2017 CLINICAL DATA:  Fall, history of fracture EXAM: LEFT FEMUR 2 VIEWS COMPARISON:  02/27/2018, 09/06/2017 FINDINGS: Left femoral head projects in joint. Moderate inferior degenerative  changes at the glenohumeral interval. Acute mildly comminuted distal femoral fracture involving the metaphysis. 1/2 bone with posterior displacement and lateral displacement of distal fracture fragment. No knee dislocation. Advanced arthritis at the knee. Vascular calcifications. IMPRESSION: Acute mildly comminuted and displaced distal femoral fracture. Electronically Signed   By: Donavan Foil M.D.   On: 09/06/2017 18:32   Dg Femur Port Min 2 Views Left  Result Date: 06/17/2018 CLINICAL DATA:  Status post open reduction and internal fixation. EXAM: LEFT FEMUR PORTABLE 2 VIEWS COMPARISON:  06/16/2018 FINDINGS: Distal left femoral fracture is again identified with medullary rod and multiple fixation screws both proximally and distally. No new fracture is noted. Soft tissue swelling is noted related to the recent injury. No acute bony abnormality is seen. IMPRESSION: Status post ORIF of distal left femoral fracture. Electronically Signed   By: Inez Catalina M.D.   On: 06/17/2018 21:07      Subjective: No issues this morning.  Discharge Exam: Vitals:   06/22/18 1940 06/23/18 0330  BP: (!) 180/87 (!) 144/86  Pulse: 82 76  Resp: 20 18  Temp: 98.5 F (36.9 C) 98.2 F (36.8 C)  SpO2: 99% 98%   Vitals:   06/22/18 0500 06/22/18 1547 06/22/18 1940 06/23/18 0330  BP: (!) 149/84 (!) 145/73 (!) 180/87 (!) 144/86  Pulse: 80 80 82 76  Resp: 14 17 20 18   Temp: 98.3 F (36.8 C) 98.3 F (36.8 C) 98.5 F (36.9 C) 98.2 F (36.8 C)  TempSrc: Oral Oral Oral Oral  SpO2: 100% 98% 99% 98%  Weight:      Height:        General: Pt is alert, awake, not in acute distress Cardiovascular: RRR, S1/S2 +, no rubs, no gallops Respiratory: CTA bilaterally, no wheezing, no rhonchi Abdominal: Soft, NT, ND, bowel sounds + Extremities: no edema, no cyanosis. Incision appears good. Staples intact.    The results of significant diagnostics from this hospitalization (including imaging, microbiology, ancillary  and laboratory) are listed below for reference.     Microbiology: Recent Results (from the past 240 hour(s))  Culture, Urine     Status: None   Collection Time: 06/15/18 11:08 PM  Result Value Ref Range Status   Specimen Description URINE, CATHETERIZED  Final   Special Requests NONE  Final   Culture   Final    NO GROWTH Performed at [facility], [address], [place], [place] [address]    Report Status 06/17/2018 FINAL  Final  MRSA PCR Screening     Status: None   Collection Time: 06/16/18  9:00 AM  Result Value Ref Range Status  MRSA by PCR NEGATIVE NEGATIVE Final    Comment:        The GeneXpert MRSA Assay (FDA approved for NASAL specimens only), is one component of a comprehensive MRSA colonization surveillance program. It is not intended to diagnose MRSA infection nor to guide or monitor treatment for MRSA infections. Performed at [facility], [place] [address] [place], [place] [address]      Labs: BNP (last 3 results) No results for input(s): BNP in the last 8760 hours. Basic Metabolic Panel: Recent Labs  Lab 06/17/18 0317 06/18/18 0329 06/19/18 1547  NA 139 140 140  K 3.8 3.9 3.8  CL 106 106 111  CO2 22 27 22   GLUCOSE 173* 105* 97  BUN 25* 28* 24*  CREATININE 1.43* 1.30* 1.10*  CALCIUM 7.3* 7.5* 7.5*   Liver Function Tests: No results for input(s): AST, ALT, ALKPHOS, BILITOT, PROT, ALBUMIN in the last 168 hours. No results for input(s): LIPASE, AMYLASE in the last 168 hours. No results for input(s): AMMONIA in the last 168 hours. CBC: Recent Labs  Lab 06/17/18 0504 06/17/18 1633 06/18/18 0329 06/19/18 0250 06/19/18 1547 06/20/18 0127  WBC 10.7*  --  11.1* 9.5 8.2 7.7  HGB 5.7* 9.1* 8.8* 7.8* 8.3* 8.8*  HCT 18.0* 27.6* 25.4* 23.8* 24.7* 26.4*  MCV 92.3  --  86.7 87.2 89.2 89.5  PLT 159  --  170 169 203 160   Cardiac Enzymes: No results for input(s): CKTOTAL, CKMB, CKMBINDEX, TROPONINI in the last 168 hours. BNP: Invalid  input(s): POCBNP CBG: No results for input(s): GLUCAP in the last 168 hours. D-Dimer No results for input(s): DDIMER in the last 72 hours. Hgb A1c No results for input(s): HGBA1C in the last 72 hours. Lipid Profile No results for input(s): CHOL, HDL, LDLCALC, TRIG, CHOLHDL, LDLDIRECT in the last 72 hours. Thyroid function studies No results for input(s): TSH, T4TOTAL, T3FREE, THYROIDAB in the last 72 hours.  Invalid input(s): FREET3 Anemia work up No results for input(s): VITAMINB12, FOLATE, FERRITIN, TIBC, IRON, RETICCTPCT in the last 72 hours. Urinalysis    Component Value Date/Time   COLORURINE YELLOW 06/16/2018 1248   APPEARANCEUR CLEAR 06/16/2018 1248   LABSPEC 1.020 06/16/2018 1248   PHURINE 5.5 06/16/2018 1248   GLUCOSEU NEGATIVE 06/16/2018 1248   GLUCOSEU NEGATIVE 04/17/2018 1455   HGBUR LARGE (A) 06/16/2018 1248   HGBUR 2+ 07/11/2010 0921   BILIRUBINUR NEGATIVE 06/16/2018 1248   BILIRUBINUR 1+ 05/08/2018 1058   KETONESUR NEGATIVE 06/16/2018 1248   PROTEINUR NEGATIVE 06/16/2018 1248   UROBILINOGEN 0.2 05/08/2018 1058   UROBILINOGEN 0.2 04/17/2018 1455   NITRITE NEGATIVE 06/16/2018 1248   LEUKOCYTESUR SMALL (A) 06/16/2018 1248   Sepsis Labs Invalid input(s): PROCALCITONIN,  WBC,  LACTICIDVEN Microbiology Recent Results (from the past 240 hour(s))  Culture, Urine     Status: None   Collection Time: 06/15/18 11:08 PM  Result Value Ref Range Status   Specimen Description URINE, CATHETERIZED  Final   Special Requests NONE  Final   Culture   Final    NO GROWTH Performed at [facility], [place] [address] [place], [place] [address]    Report Status 06/17/2018 FINAL  Final  MRSA PCR Screening     Status: None   Collection Time: 06/16/18  9:00 AM  Result Value Ref Range Status   MRSA by PCR NEGATIVE NEGATIVE Final    Comment:        The GeneXpert MRSA Assay (FDA approved for NASAL  specimens only), is one component of a comprehensive MRSA  colonization surveillance program. It is not intended to diagnose MRSA infection nor to guide or monitor treatment for MRSA infections. Performed at [facility], [place] [address] [place], [place] [address]     SIGNED:   Cordelia Poche, MD [facility] 06/23/2018, 11:57 AM

## 2018-06-23 NOTE — Progress Notes (Signed)
Pt alert, oriented x3. Report called to [facility] in [place].

## 2018-06-23 NOTE — Progress Notes (Addendum)
CSW received notification that we have insurance authorization for patient to discharge to [facility] today.   CSW notified Dr. Lonny Prude regarding need of DC summary and orders.   CSW will continue to follow for discharge needs.   [place], [place]

## 2018-06-24 DIAGNOSIS — L89150 Pressure ulcer of sacral region, unstageable: Secondary | ICD-10-CM | POA: Diagnosis not present

## 2018-06-28 DIAGNOSIS — R2689 Other abnormalities of gait and mobility: Secondary | ICD-10-CM | POA: Diagnosis not present

## 2018-06-28 DIAGNOSIS — G309 Alzheimer's disease, unspecified: Secondary | ICD-10-CM | POA: Diagnosis not present

## 2018-06-28 DIAGNOSIS — S72452D Displaced supracondylar fracture without intracondylar extension of lower end of left femur, subsequent encounter for closed fracture with routine healing: Secondary | ICD-10-CM | POA: Diagnosis not present

## 2018-06-28 DIAGNOSIS — E441 Mild protein-calorie malnutrition: Secondary | ICD-10-CM | POA: Diagnosis not present

## 2018-07-01 DIAGNOSIS — L89152 Pressure ulcer of sacral region, stage 2: Secondary | ICD-10-CM | POA: Diagnosis not present

## 2018-07-03 ENCOUNTER — Telehealth: Payer: Self-pay | Admitting: Adult Health

## 2018-07-03 NOTE — Telephone Encounter (Signed)
Spoke to Veronica Phillips.  Pt is currently in [facility] due to a broke leg.  Surgery was right before Christmas. Nursing staff reports the pt is not thriving due to her weight.  Nursing staff is currently administering Megace twice daily. [name] is looking for recommendations.   Fax # for orders: [phone].

## 2018-07-03 NOTE — Telephone Encounter (Signed)
Copied from [place] [id number]. Topic: General - Other >> Jul 03, 2018  8:39 AM Keene Breath wrote: Reason for CRM: Patient's daughter called to speak to the nurse regarding her mother.  Patient is currently in rehab, but daughter would like to speak with the nurse or doctor.  CB# [phone]

## 2018-07-04 ENCOUNTER — Ambulatory Visit (INDEPENDENT_AMBULATORY_CARE_PROVIDER_SITE_OTHER): Payer: Medicare Other | Admitting: Physician Assistant

## 2018-07-04 ENCOUNTER — Other Ambulatory Visit: Payer: Self-pay | Admitting: Adult Health

## 2018-07-04 ENCOUNTER — Encounter (INDEPENDENT_AMBULATORY_CARE_PROVIDER_SITE_OTHER): Payer: Self-pay | Admitting: Orthopaedic Surgery

## 2018-07-04 ENCOUNTER — Ambulatory Visit (INDEPENDENT_AMBULATORY_CARE_PROVIDER_SITE_OTHER): Payer: Medicare Other

## 2018-07-04 ENCOUNTER — Encounter: Payer: Self-pay | Admitting: Family Medicine

## 2018-07-04 DIAGNOSIS — S72452D Displaced supracondylar fracture without intracondylar extension of lower end of left femur, subsequent encounter for closed fracture with routine healing: Secondary | ICD-10-CM | POA: Diagnosis not present

## 2018-07-04 MED ORDER — MIRTAZAPINE 7.5 MG PO TABS: 7.5000 mg | ORAL_TABLET | Freq: Every day | ORAL | 0 refills | Status: DC

## 2018-07-04 NOTE — Progress Notes (Signed)
Post-Op Visit Note   Patient: Veronica Phillips           Date of Birth: [birth year]           MRN: [id number] Visit Date: 07/04/2018 PCP: Dorothyann Peng, NP   Assessment & Plan:  Chief Complaint:  Chief Complaint  Patient presents with  . Left Hip - Routine Post Op   Visit Diagnoses:  1. Closed supracondylar fracture of left femur with routine healing, subsequent encounter     Plan: Patient is a pleasant 85 year old female who presents to our clinic today 2 weeks status post ORIF left supracondylar femur fracture date of surgery 06/16/2018.  She has been residing in a skilled nursing facility.  She has been compliant nonweightbearing and wearing her knee immobilizer at all times.  She denies pain to the left leg.  No fevers or chills.  No calf pain.  Examination of the left lower extremity reveals well healing surgical incisions without evidence of infection.  Calf is soft nontender.  Today, the staples were removed.  We will transition the patient from a knee immobilizer into a hinged knee brace.  She will remain nonweightbearing for a total of 6 to 8 weeks postop.  We will have her begin range of motion exercises as tolerated to the left knee.  This was all written on her skilled nursing facility paperwork.  She will follow-up with [place] in 4 weeks time for repeat evaluation and x-rays.  Follow-Up Instructions: Return in about 4 weeks (around 08/01/2018).   Orders:  Orders Placed This Encounter  Procedures  . XR FEMUR MIN 2 VIEWS LEFT   No orders of the defined types were placed in this encounter.   Imaging: Xr Femur Min 2 Views Left  Result Date: 07/04/2018 X-rays demonstrate stable alignment supracondylar fracture   PMFS History: Patient Active Problem List   Diagnosis Date Noted  . Pressure injury of skin 06/16/2018  . Closed supracondylar fracture of left femur ([place]) 04/16/2018  . Fall at home, initial encounter 04/16/2018  . Recurrent UTI 04/16/2018  . Acute metabolic  encephalopathy 40/98/1191  . Orthostatic hypotension 03/11/2018  . Acute encephalopathy 03/11/2018  . Bilateral primary osteoarthritis of knee 02/27/2018  . Weight loss 08/30/2017  . S/p left hip fracture 07/12/2017  . Subcoracoid bursitis, right 07/12/2017  . CKD (chronic kidney disease), stage III ([place]) 12/29/2015  . Dizziness 12/29/2015  . Lower urinary tract infectious disease 12/29/2015  . Orthostatic syncope 12/29/2015  . Fall   . Chronic anemia   . Need for prophylactic vaccination against Streptococcus pneumoniae (pneumococcus) 01/06/2015  . Precordial chest pain 11/05/2014  . Dysphagia, pharyngoesophageal phase   . 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 Achalasia 07/07/2013  . Peripheral edema 05/19/2012  . Acute cystitis without hematuria 02/08/2011  . Glaucoma 07/17/2010  . GERD 07/17/2010  . OTHER DYSPHAGIA 07/17/2010  . PROBLEMS WITH SWALLOWING AND MASTICATION 07/11/2010  . IDIOPATHIC URTICARIA 04/11/2010  . ANEMIA, B12 DEFICIENCY 05/18/2008  . OSTEOARTHRITIS 05/12/2007  . DIVERTICULOSIS, COLON 02/26/2007   Past Medical History:  Diagnosis Date  . Arthritis    "knees, shoulders" (12/29/2015)  . Atrophic gastritis   . Cancer of left breast ([place]) 1989  . CKD (chronic kidney disease), stage III ([place])   . Diverticulosis of colon   . GERD (gastroesophageal reflux disease)   . Glaucoma   . Headache    "awful headaches when I was coming up in my teens"  . Hypertension   .  Lactose intolerance   . Pernicious anemia   . Vitamin B 12 deficiency     Family History  Problem Relation Age of Onset  . Hypertension Mother        family hx  . Lung cancer Father        family hx  . Stroke Other        family hx  . Heart disease Sister        family hx    Past Surgical History:  Procedure Laterality Date  . BALLOON DILATION N/A 06/28/2014   Procedure: BALLOON DILATION;  Surgeon: Ladene Artist, MD;  Location: [facility];  Service: Endoscopy;  Laterality: N/A;  .  BOTOX INJECTION N/A 07/09/2013   Procedure: BOTOX INJECTION;  Surgeon: Ladene Artist, MD;  [facility];  Service: Endoscopy;  Laterality: N/A;  . BOTOX INJECTION N/A 06/28/2014   Procedure: BOTOX INJECTION;  Surgeon: Ladene Artist, MD;  [facility];  Service: Endoscopy;  Laterality: N/A;  . CATARACT EXTRACTION W/ INTRAOCULAR LENS  IMPLANT, BILATERAL Bilateral 08-2015-10/2015  . ESOPHAGEAL MANOMETRY N/A 10/27/2012   Procedure: ESOPHAGEAL MANOMETRY (EM);  Surgeon: Ladene Artist, MD;  [facility];  Service: Endoscopy;  Laterality: N/A;  . ESOPHAGOGASTRODUODENOSCOPY N/A 07/09/2013   Procedure: ESOPHAGOGASTRODUODENOSCOPY (EGD);  Surgeon: Ladene Artist, MD;  Location: [facility];  Service: Endoscopy;  Laterality: N/A;  . ESOPHAGOGASTRODUODENOSCOPY N/A 10/07/2013   Procedure: ESOPHAGOGASTRODUODENOSCOPY (EGD);  Surgeon: Ladene Artist, MD;  Location: [facility];  Service: Endoscopy;  Laterality: N/A;  wtih botox  . ESOPHAGOGASTRODUODENOSCOPY (EGD) WITH PROPOFOL N/A 06/28/2014   Procedure: ESOPHAGOGASTRODUODENOSCOPY (EGD) WITH PROPOFOL;  Surgeon: Ladene Artist, MD;  [facility];  Service: Endoscopy;  Laterality: N/A;  . MASTECTOMY Left 1989  . ORIF FEMUR FRACTURE Left 06/16/2018   Procedure: OPEN REDUCTION INTERNAL FIXATION (ORIF) DISTAL FEMUR FRACTURE;  Surgeon: Leandrew Koyanagi, MD;  Location: [facility];  Service: Orthopedics;  Laterality: Left;   Social History   Occupational History  . Not on file  Tobacco Use  . Smoking status: Never Smoker  . Smokeless tobacco: Never Used  Substance and Sexual Activity  . Alcohol use: No  . Drug use: No  . Sexual activity: Not Currently

## 2018-07-04 NOTE — Telephone Encounter (Signed)
Pt daughter Veronica Phillips is calling back she still want to talk with [facility] about RX she doesn't know if [facility] would accept it please call Veronica Phillips at [phone]

## 2018-07-04 NOTE — Telephone Encounter (Signed)
Left a message for a return call.  Need to see if Veronica Phillips wants the prescription sent to the pharmacy or send order to [facility] and have them import prescription.  Megace needs to be stopper per [name].

## 2018-07-04 NOTE — Telephone Encounter (Signed)
Order sent and received confirmation that the fax was successful.  Nothing further needed.

## 2018-07-04 NOTE — Telephone Encounter (Signed)
Spoke to Rochelle and informed her that I will send in medication.  Advised they Megace should no longer be given.  Will send a fax order to [facility].

## 2018-07-04 NOTE — Telephone Encounter (Signed)
We can trial Remeron 7.5 mg QHS. Family should be aware that medications that are commonly used to help people gain weight can be sedating

## 2018-07-08 DIAGNOSIS — L89152 Pressure ulcer of sacral region, stage 2: Secondary | ICD-10-CM | POA: Diagnosis not present

## 2018-07-09 ENCOUNTER — Telehealth (INDEPENDENT_AMBULATORY_CARE_PROVIDER_SITE_OTHER): Payer: Self-pay

## 2018-07-09 ENCOUNTER — Telehealth (INDEPENDENT_AMBULATORY_CARE_PROVIDER_SITE_OTHER): Payer: Self-pay | Admitting: Orthopaedic Surgery

## 2018-07-09 NOTE — Telephone Encounter (Signed)
See message below. Okay for her to come in for nurse visit.

## 2018-07-09 NOTE — Telephone Encounter (Signed)
Yes please thanks.

## 2018-07-09 NOTE — Telephone Encounter (Signed)
IC LMVM for Veronica Phillips to call us and hit option for triage and we can schedule her for a nurse visit to look at this and check incision as well.

## 2018-07-09 NOTE — Telephone Encounter (Signed)
Kelly with [facility] called stating that patient has 5 staples that were not taking out at her last office visit.  Would like to know if Dr. Erlinda Hong wants to have the staples removed and if so, please fax an order to [phone].  Cb# is [phone] and ask for [employer].  Please advise. Thank you.

## 2018-07-09 NOTE — Telephone Encounter (Signed)
Patient came in to have her staples removed and several were left in.  Daughter very concerned and noticed it when they were changing her.  Please give daughter [name] a call as she is very concerned.  [phone]

## 2018-07-09 NOTE — Telephone Encounter (Signed)
Please advise 

## 2018-07-09 NOTE — Telephone Encounter (Signed)
yes

## 2018-07-09 NOTE — Telephone Encounter (Signed)
I spoke with Cristie Hem at [facility], giving her the verbal order. Faxed a written order as well.

## 2018-07-10 NOTE — Telephone Encounter (Signed)
See previous message

## 2018-07-13 DIAGNOSIS — G309 Alzheimer's disease, unspecified: Secondary | ICD-10-CM | POA: Diagnosis not present

## 2018-07-13 DIAGNOSIS — K219 Gastro-esophageal reflux disease without esophagitis: Secondary | ICD-10-CM | POA: Diagnosis not present

## 2018-07-13 DIAGNOSIS — E46 Unspecified protein-calorie malnutrition: Secondary | ICD-10-CM | POA: Diagnosis not present

## 2018-07-13 DIAGNOSIS — F0280 Dementia in other diseases classified elsewhere without behavioral disturbance: Secondary | ICD-10-CM | POA: Diagnosis not present

## 2018-07-13 DIAGNOSIS — N183 Chronic kidney disease, stage 3 (moderate): Secondary | ICD-10-CM | POA: Diagnosis not present

## 2018-07-13 DIAGNOSIS — E538 Deficiency of other specified B group vitamins: Secondary | ICD-10-CM | POA: Diagnosis not present

## 2018-07-13 DIAGNOSIS — S72452D Displaced supracondylar fracture without intracondylar extension of lower end of left femur, subsequent encounter for closed fracture with routine healing: Secondary | ICD-10-CM | POA: Diagnosis not present

## 2018-07-13 DIAGNOSIS — I951 Orthostatic hypotension: Secondary | ICD-10-CM | POA: Diagnosis not present

## 2018-07-13 DIAGNOSIS — K5900 Constipation, unspecified: Secondary | ICD-10-CM | POA: Diagnosis not present

## 2018-07-13 DIAGNOSIS — H409 Unspecified glaucoma: Secondary | ICD-10-CM | POA: Diagnosis not present

## 2018-07-13 DIAGNOSIS — D649 Anemia, unspecified: Secondary | ICD-10-CM | POA: Diagnosis not present

## 2018-07-13 DIAGNOSIS — I129 Hypertensive chronic kidney disease with stage 1 through stage 4 chronic kidney disease, or unspecified chronic kidney disease: Secondary | ICD-10-CM | POA: Diagnosis not present

## 2018-07-13 DIAGNOSIS — L89153 Pressure ulcer of sacral region, stage 3: Secondary | ICD-10-CM | POA: Diagnosis not present

## 2018-07-14 DIAGNOSIS — L89153 Pressure ulcer of sacral region, stage 3: Secondary | ICD-10-CM | POA: Diagnosis not present

## 2018-07-14 DIAGNOSIS — K219 Gastro-esophageal reflux disease without esophagitis: Secondary | ICD-10-CM | POA: Diagnosis not present

## 2018-07-14 DIAGNOSIS — N183 Chronic kidney disease, stage 3 (moderate): Secondary | ICD-10-CM | POA: Diagnosis not present

## 2018-07-14 DIAGNOSIS — K5900 Constipation, unspecified: Secondary | ICD-10-CM | POA: Diagnosis not present

## 2018-07-14 DIAGNOSIS — F0280 Dementia in other diseases classified elsewhere without behavioral disturbance: Secondary | ICD-10-CM | POA: Diagnosis not present

## 2018-07-14 DIAGNOSIS — D649 Anemia, unspecified: Secondary | ICD-10-CM | POA: Diagnosis not present

## 2018-07-14 DIAGNOSIS — H409 Unspecified glaucoma: Secondary | ICD-10-CM | POA: Diagnosis not present

## 2018-07-14 DIAGNOSIS — G309 Alzheimer's disease, unspecified: Secondary | ICD-10-CM | POA: Diagnosis not present

## 2018-07-14 DIAGNOSIS — E46 Unspecified protein-calorie malnutrition: Secondary | ICD-10-CM | POA: Diagnosis not present

## 2018-07-14 DIAGNOSIS — I129 Hypertensive chronic kidney disease with stage 1 through stage 4 chronic kidney disease, or unspecified chronic kidney disease: Secondary | ICD-10-CM | POA: Diagnosis not present

## 2018-07-14 DIAGNOSIS — S72452D Displaced supracondylar fracture without intracondylar extension of lower end of left femur, subsequent encounter for closed fracture with routine healing: Secondary | ICD-10-CM | POA: Diagnosis not present

## 2018-07-14 DIAGNOSIS — E538 Deficiency of other specified B group vitamins: Secondary | ICD-10-CM | POA: Diagnosis not present

## 2018-07-14 DIAGNOSIS — I951 Orthostatic hypotension: Secondary | ICD-10-CM | POA: Diagnosis not present

## 2018-07-15 ENCOUNTER — Telehealth: Payer: Self-pay | Admitting: Adult Health

## 2018-07-15 NOTE — Telephone Encounter (Signed)
Copied from [place]. Topic: Quick Communication - See Telephone Encounter >> Jul 15, 2018 10:38 AM Antonieta Iba C wrote: CRM for notification. See Telephone encounter for: 07/15/18.  Mariel Kansky - PT w/ [facility] is calling in to request PT orders.   Frequency: 2 week 3; effective January 20,20   CB: [phone] - [place]

## 2018-07-17 NOTE — Telephone Encounter (Signed)
Verbal orders ok

## 2018-07-17 NOTE — Telephone Encounter (Signed)
Gave verbal orders to [facility] for PT as requested.

## 2018-07-17 NOTE — Telephone Encounter (Signed)
Is it okay to give verbal orders?  

## 2018-07-22 ENCOUNTER — Inpatient Hospital Stay: Payer: Medicare Other | Admitting: Adult Health

## 2018-07-23 ENCOUNTER — Ambulatory Visit: Payer: Medicare Other | Admitting: Adult Health

## 2018-07-23 ENCOUNTER — Encounter: Payer: Self-pay | Admitting: Adult Health

## 2018-07-23 ENCOUNTER — Other Ambulatory Visit: Payer: Self-pay | Admitting: Adult Health

## 2018-07-23 VITALS — BP 80/40 | Temp 97.9°F

## 2018-07-23 DIAGNOSIS — D649 Anemia, unspecified: Secondary | ICD-10-CM | POA: Diagnosis not present

## 2018-07-23 DIAGNOSIS — I951 Orthostatic hypotension: Secondary | ICD-10-CM

## 2018-07-23 DIAGNOSIS — S72452D Displaced supracondylar fracture without intracondylar extension of lower end of left femur, subsequent encounter for closed fracture with routine healing: Secondary | ICD-10-CM | POA: Diagnosis not present

## 2018-07-23 DIAGNOSIS — G9341 Metabolic encephalopathy: Secondary | ICD-10-CM | POA: Diagnosis not present

## 2018-07-23 DIAGNOSIS — R634 Abnormal weight loss: Secondary | ICD-10-CM

## 2018-07-23 MED ORDER — MIDODRINE HCL 2.5 MG PO TABS: 2.5000 mg | ORAL_TABLET | Freq: Three times a day (TID) | ORAL | 0 refills | Status: DC

## 2018-07-23 MED ORDER — MIRTAZAPINE 15 MG PO TABS: 15.0000 mg | ORAL_TABLET | Freq: Every day | ORAL | 1 refills | Status: DC

## 2018-07-23 NOTE — Patient Instructions (Signed)
It was great seeing you today   I am glad you are recovering well   I have sent in a medication called Midodrine to help raise your blood pressure   I have also increased Remeron to 15 mg at night   Please stop by the lab on your way out   Follow up in two weeks

## 2018-07-23 NOTE — Progress Notes (Signed)
Subjective:    Patient ID: Veronica Phillips, female    DOB: [birth year], 85 y.o.   MRN: [id number]  HPI 85 year old female who  has a past medical history of Arthritis, Atrophic gastritis, Cancer of left breast ([place]) (1989), CKD (chronic kidney disease), stage III ([place]), Diverticulosis of colon, GERD (gastroesophageal reflux disease), Glaucoma, Headache, Hypertension, Lactose intolerance, Pernicious anemia, and Vitamin B 12 deficiency.  She presents to the office today for TCM visit.   She presents with her daughter and grandson to this visit   Admit Date: 2020/03/1318 Discharge Date:  06/23/2018 Discharge from SNF: 07/14/2018?  Patient presented to the emergency room status post fall at home.  Patient lives at home with her daughter normally ambulates with the use of a cane or in a wheelchair.  The patient's daughter had been out of the house and the patient reported hearing a noise at which she got up and went to the front door.  The patient fell reported hitting her head on a concrete porch.  It was unclear if she lost consciousness, but family believes that she most likely did..  The patient has been intermittently confused lately and has frequent UTIs.  She was placed on a 90-day course of Cipro by this Probation officer.  On admission patient was afebrile, labs revealed a white blood cell count 7.7, hemoglobin 10.3, BUN 19, and creatinine 1.14.  X-rays revealed a mildly comminuted and displaced distal femoral  fracture.  Dr. Ninfa Linden of orthopedics was consulted and recommended transfer to [facility] for surgery.  Hospital Course  Left Femur Fracture -Status post ORIF on 12/23.  She was given Lovenox for DVT prophylactics, transferred to skilled nursing facility and was advised nonweightbearing on left leg.  Outpatient orthopedic follow-up  Acute Blood Loss Anemia  -In setting of recent surgery and Lovenox treatment.  There was no evidence of hematoma on bedside evaluation.  Hemoglobin as low as  5.7.  Her baseline is 9-10.  She was given 2 units of PRBC in the hospital.  Upon discharge hemoglobin was stable  Orthostatic Hypotension  - PT recommended SNF  AKI on CKD Stage 3 -Cr. baseline of 1.1.  Creatinine of 1.43 on admission, was down to 1.1 upon discharge  Acute metabolic encephalopathy  -CT of the head was obtained which was unremarkable.  Appears to be resolved upon discharge.  Unknown etiology  Today in the office family reports that the patient is doing fairly well.  She continues to have episodes of static hypotension.  The family reports that when she changes positions has not become unusual for the patient to "pass out for a few seconds".  He does monitor her blood pressures at home periodically and reports readings between 80 and 268 systolic.  Patient reports that pain is controlled with over-the-counter medications.  She continues to be nonweightbearing.  There has been no worsening of confusion, and family seems to think that this is actually become improved.  Denies any fevers, chills, nausea, vomiting, diarrhea since being discharged.  Her appetite is good.  A follow-up appointment with orthopedics on February 7  Review of Systems  Constitutional: Negative.   HENT: Negative.   Respiratory: Negative.   Cardiovascular: Negative.   Gastrointestinal: Negative.   Genitourinary: Negative.   Musculoskeletal: Positive for arthralgias and gait problem.  Neurological: Positive for syncope and light-headedness.  Hematological: Negative.   Psychiatric/Behavioral: Negative.   All other systems reviewed and are negative.  Past Medical History:  Diagnosis Date  .  Arthritis    "knees, shoulders" (12/29/2015)  . Atrophic gastritis   . Cancer of left breast ([place]) 1989  . CKD (chronic kidney disease), stage III ([place])   . Diverticulosis of colon   . GERD (gastroesophageal reflux disease)   . Glaucoma   . Headache    "awful headaches when I was coming up in my teens"  .  Hypertension   . Lactose intolerance   . Pernicious anemia   . Vitamin B 12 deficiency     Social History   Socioeconomic History  . Marital status: Widowed    Spouse name: Not on file  . Number of children: Not on file  . Years of education: Not on file  . Highest education level: Not on file  Occupational History  . Not on file  Social Needs  . Financial resource strain: Not on file  . Food insecurity:    Worry: Not on file    Inability: Not on file  . Transportation needs:    Medical: Not on file    Non-medical: Not on file  Tobacco Use  . Smoking status: Never Smoker  . Smokeless tobacco: Never Used  Substance and Sexual Activity  . Alcohol use: No  . Drug use: No  . Sexual activity: Not Currently  Lifestyle  . Physical activity:    Days per week: Not on file    Minutes per session: Not on file  . Stress: Not on file  Relationships  . Social connections:    Talks on phone: Not on file    Gets together: Not on file    Attends religious service: Not on file    Active member of club or organization: Not on file    Attends meetings of clubs or organizations: Not on file    Relationship status: Not on file  . Intimate partner violence:    Fear of current or ex partner: Not on file    Emotionally abused: Not on file    Physically abused: Not on file    Forced sexual activity: Not on file  Other Topics Concern  . 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 BALLOON DILATION N/A 06/28/2014   Procedure: BALLOON DILATION;  Surgeon: Ladene Artist, MD;  [facility];  Service: Endoscopy;  Laterality: N/A;  . BOTOX INJECTION N/A 07/09/2013   Procedure: BOTOX INJECTION;  Surgeon: Ladene Artist, MD;  [facility];  Service: Endoscopy;  Laterality: N/A;  . BOTOX INJECTION N/A 06/28/2014   Procedure: BOTOX INJECTION;  Surgeon: Ladene Artist, MD;  [facility];  Service: Endoscopy;  Laterality: N/A;  . CATARACT EXTRACTION W/ INTRAOCULAR LENS  IMPLANT, BILATERAL Bilateral 08-2015-10/2015  . ESOPHAGEAL MANOMETRY N/A 10/27/2012   Procedure: ESOPHAGEAL MANOMETRY (EM);  Surgeon: Ladene Artist, MD;  [facility];  Service: Endoscopy;  Laterality: N/A;  . ESOPHAGOGASTRODUODENOSCOPY N/A 07/09/2013   Procedure: ESOPHAGOGASTRODUODENOSCOPY (EGD);  Surgeon: Ladene Artist, MD;  Location: [facility];  Service: Endoscopy;  Laterality: N/A;  . ESOPHAGOGASTRODUODENOSCOPY N/A 10/07/2013   Procedure: ESOPHAGOGASTRODUODENOSCOPY (EGD);  Surgeon: Ladene Artist, MD;  Location: [facility];  Service: Endoscopy;  Laterality: N/A;  wtih botox  . ESOPHAGOGASTRODUODENOSCOPY (EGD) WITH PROPOFOL N/A 06/28/2014   Procedure: ESOPHAGOGASTRODUODENOSCOPY (EGD) WITH PROPOFOL;  Surgeon: Ladene Artist, MD;  [facility];  Service: Endoscopy;  Laterality:  N/A;  . MASTECTOMY Left 1989  . ORIF FEMUR FRACTURE Left 06/16/2018   Procedure: OPEN REDUCTION INTERNAL FIXATION (ORIF) DISTAL FEMUR FRACTURE;  Surgeon: Leandrew Koyanagi, MD;  Location: [facility];  Service: Orthopedics;  Laterality: Left;    Family History  Problem Relation Age of Onset  . Hypertension Mother        family hx  . Lung cancer Father        family hx  . Stroke Other        family hx  . Heart disease Sister        family hx    Allergies  Allergen Reactions  . Septra [Bactrim] Swelling    Site of swelling not recalled by the patient  . Lactose Intolerance (Gi) Diarrhea  . Sulfa Antibiotics Swelling    Site of swelling not recalled by the patient  . Tape Other (See Comments)    Skin is sensitive; please use paper tape!!    Current Outpatient Medications on File Prior to Visit  Medication Sig Dispense Refill  . acetaminophen (TYLENOL) 500 MG tablet Take 500 mg by mouth every 6 (six) hours as needed (for pain).     . Ascorbic Acid (VITAMIN C PO) Take 1 tablet by mouth at bedtime.       Marland Kitchen BIOTIN PO Take 1 tablet by mouth every evening.    . Cyanocobalamin (VITAMIN B-12 PO) Take 1 tablet by mouth at bedtime.     . docusate (COLACE) 50 MG/5ML liquid Take 10 mLs (100 mg total) by mouth 2 (two) times daily.    . ferrous gluconate (FERGON) 325 MG tablet Take 325 mg by mouth daily after supper.     . lactose free nutrition (BOOST PLUS) LIQD Take 237 mLs by mouth 3 (three) times daily with meals.  0  . latanoprost (XALATAN) 0.005 % ophthalmic solution Place 1 drop into both eyes at bedtime.   0  . Multiple Vitamins-Minerals (ONE-A-DAY WOMENS 50 PLUS PO) Take 1 tablet by mouth daily after supper.     . multivitamin-lutein (OCUVITE-LUTEIN) CAPS capsule Take 1 capsule by mouth daily.    Marland Kitchen omeprazole (PRILOSEC) 20 MG capsule Take 1 capsule (20 mg total) by mouth daily. 90 capsule 3  . polyethylene glycol (MIRALAX / GLYCOLAX) packet Take 17 g by mouth daily as needed for mild constipation. 14 each 0  . potassium chloride (K-DUR) 10 MEQ tablet Take 1 tablet (10 mEq total) by mouth daily. 90 tablet 1  . vitamin E 400 UNIT capsule Take 400 Units by mouth at bedtime.      No current facility-administered medications on file prior to visit.     BP (!) 80/40   Temp 97.9 F (36.6 C)       Objective:   Physical Exam Vitals signs and nursing note reviewed.  Constitutional:      Appearance: Normal appearance. She is well-groomed and underweight.     Comments: Soft voice   Cardiovascular:     Rate and Rhythm: Normal rate and regular rhythm.     Pulses: Normal pulses.     Heart sounds: Normal heart sounds.  Pulmonary:     Effort: Pulmonary effort is normal.     Breath sounds: Normal breath sounds.  Musculoskeletal:        General: Signs of injury present.     Comments: In wheelchair  Skin:    General: Skin is warm and dry.  Neurological:     General: No  focal deficit present.     Mental Status: She is alert and oriented to person, place, and time.     Coordination: Coordination  abnormal.     Gait: Gait abnormal.  Psychiatric:        Mood and Affect: Mood normal.        Behavior: Behavior normal. Behavior is cooperative.        Thought Content: Thought content normal.        Judgment: Judgment normal.       Assessment & Plan:  1. Acute metabolic encephalopathy -Alert and oriented in the office today she is able to answer questions appropriately.  We will continue to monitor - CBC with Differential/Platelet - Basic Metabolic Panel  2. Orthostatic hypotension -In the setting of orthostatic hypotension will start on midodrine 2.5 mg 3 times daily.  Advised family to monitor blood pressures daily.  Follow-up in 2 weeks unless blood pressures increasing above 150 over 90s - CBC with Differential/Platelet - Basic Metabolic Panel - midodrine (PROAMATINE) 2.5 MG tablet; Take 1 tablet (2.5 mg total) by mouth 3 (three) times daily with meals for 30 days.  Dispense: 90 tablet; Refill: 0  3. Closed supracondylar fracture of left femur with routine healing, subsequent encounter -Reviewed hospital notes, labs, and imaging with the patient and her family today.  All questions were answered to the best of my ability. -Follow up with orthopedics as directed  4. Chronic anemia  - CBC with Differential/Platelet - Basic Metabolic Panel  5. Weight loss -Continue on Remeron.  Will increase from 7.5 mg to 15 mg.  Advised family that I would like her to have close supervision due to sedating effects.  Family is okay with this plan - mirtazapine (REMERON) 15 MG tablet; Take 1 tablet (15 mg total) by mouth at bedtime. CAN BE SEDATING.  Dispense: 90 tablet; Refill: 1  Dorothyann Peng, NP

## 2018-07-24 LAB — CBC WITH DIFFERENTIAL/PLATELET
BASOS ABS: 0.1 10*3/uL (ref 0.0–0.1)
Basophils Relative: 1.1 % (ref 0.0–3.0)
Eosinophils Absolute: 0 10*3/uL (ref 0.0–0.7)
Eosinophils Relative: 0 % (ref 0.0–5.0)
HCT: 31.3 % — ABNORMAL LOW (ref 36.0–46.0)
Hemoglobin: 10.4 g/dL — ABNORMAL LOW (ref 12.0–15.0)
Lymphocytes Relative: 23.3 % (ref 12.0–46.0)
Lymphs Abs: 1.3 10*3/uL (ref 0.7–4.0)
MCHC: 33.1 g/dL (ref 30.0–36.0)
MCV: 90.5 fl (ref 78.0–100.0)
Monocytes Absolute: 0.6 10*3/uL (ref 0.1–1.0)
Monocytes Relative: 10.8 % (ref 3.0–12.0)
NEUTROS ABS: 3.7 10*3/uL (ref 1.4–7.7)
NEUTROS PCT: 64.8 % (ref 43.0–77.0)
PLATELETS: 219 10*3/uL (ref 150.0–400.0)
RBC: 3.46 Mil/uL — AB (ref 3.87–5.11)
RDW: 15.2 % (ref 11.5–15.5)
WBC: 5.8 10*3/uL (ref 4.0–10.5)

## 2018-07-24 LAB — BASIC METABOLIC PANEL
BUN: 19 mg/dL (ref 6–23)
CO2: 24 meq/L (ref 19–32)
Calcium: 8.7 mg/dL (ref 8.4–10.5)
Chloride: 109 mEq/L (ref 96–112)
Creatinine, Ser: 1.06 mg/dL (ref 0.40–1.20)
GFR: 58.51 mL/min — ABNORMAL LOW (ref >60.00)
Glucose, Bld: 108 mg/dL — ABNORMAL HIGH (ref 70–99)
Potassium: 3.6 mEq/L (ref 3.5–5.1)
Sodium: 142 mEq/L (ref 135–145)

## 2018-07-24 NOTE — Telephone Encounter (Signed)
90 day supply denied until reevaluated in the office.

## 2018-08-01 ENCOUNTER — Encounter (INDEPENDENT_AMBULATORY_CARE_PROVIDER_SITE_OTHER): Payer: Self-pay | Admitting: Orthopaedic Surgery

## 2018-08-01 ENCOUNTER — Ambulatory Visit (INDEPENDENT_AMBULATORY_CARE_PROVIDER_SITE_OTHER): Payer: Medicare Other | Admitting: Orthopaedic Surgery

## 2018-08-01 ENCOUNTER — Ambulatory Visit (INDEPENDENT_AMBULATORY_CARE_PROVIDER_SITE_OTHER): Payer: Medicare Other

## 2018-08-01 VITALS — Ht 63.0 in | Wt 101.0 lb

## 2018-08-01 DIAGNOSIS — S72452D Displaced supracondylar fracture without intracondylar extension of lower end of left femur, subsequent encounter for closed fracture with routine healing: Secondary | ICD-10-CM

## 2018-08-01 NOTE — Progress Notes (Signed)
Post-Op Visit Note   Patient: Veronica Phillips           Date of Birth: Oct 30, 1922           MRN: [id number] Visit Date: 08/01/2018 PCP: Dorothyann Peng, NP   Assessment & Plan:  Chief Complaint:  Chief Complaint  Patient presents with  . Left Leg - Routine Post Op    06/16/18 left ORIF supracondylar femur fracture.    Visit Diagnoses:  1. Closed supracondylar fracture of left femur with routine healing, subsequent encounter     Plan: Alvis Lemmings is 6 weeks status post retrograde intramedullary nailing of left supracondylar femur fracture.  She is overall doing well and reports no constant pain.  She takes occasional Tylenol.  Surgical scars are fully healed.  Her x-rays demonstrate evidence of continued healing.  Overall she is doing better.  I am happy with her progress.  I would like to advance her to weight-bear as tolerated to the left lower extremity and progress with physical therapy.  We will see her back in 6 weeks with two-view x-rays of the left knee.  Questions encouraged and answered.  Follow-Up Instructions: Return in about 6 weeks (around 09/12/2018).   Orders:  Orders Placed This Encounter  Procedures  . XR FEMUR MIN 2 VIEWS LEFT   No orders of the defined types were placed in this encounter.   Imaging: Xr Femur Min 2 Views Left  Result Date: 08/01/2018 Stable fixation of supracondylar femur fracture with evidence of healing   PMFS History: Patient Active Problem List   Diagnosis Date Noted  . Pressure injury of skin 06/16/2018  . Closed supracondylar fracture of left femur ([place]) Apr 28, 202019  . Fall at home, initial encounter Apr 28, 202019  . Recurrent UTI Apr 28, 202019  . Acute metabolic encephalopathy 48/54/6270  . Orthostatic hypotension 03/11/2018  . Acute encephalopathy 03/11/2018  . Bilateral primary osteoarthritis of knee 02/27/2018  . Weight loss 08/30/2017  . S/p left hip fracture 07/12/2017  . Subcoracoid bursitis, right 07/12/2017  . CKD (chronic  kidney disease), stage III ([place]) 12/29/2015  . Dizziness 12/29/2015  . Lower urinary tract infectious disease 12/29/2015  . Orthostatic syncope 12/29/2015  . Fall   . Chronic anemia   . Need for prophylactic vaccination against Streptococcus pneumoniae (pneumococcus) 01/06/2015  . Precordial chest pain 11/05/2014  . Dysphagia, pharyngoesophageal phase   . 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 Achalasia 07/07/2013  . Peripheral edema 05/19/2012  . Acute cystitis without hematuria 02/08/2011  . Glaucoma 07/17/2010  . GERD 07/17/2010  . OTHER DYSPHAGIA 07/17/2010  . PROBLEMS WITH SWALLOWING AND MASTICATION 07/11/2010  . IDIOPATHIC URTICARIA 04/11/2010  . ANEMIA, B12 DEFICIENCY 05/18/2008  . OSTEOARTHRITIS 05/12/2007  . DIVERTICULOSIS, COLON 02/26/2007   Past Medical History:  Diagnosis Date  . Arthritis    "knees, shoulders" (12/29/2015)  . Atrophic gastritis   . Cancer of left breast ([place]) 1989  . CKD (chronic kidney disease), stage III ([place])   . Diverticulosis of colon   . GERD (gastroesophageal reflux disease)   . Glaucoma   . Headache    "awful headaches when I was coming up in my teens"  . Hypertension   . Lactose intolerance   . Pernicious anemia   . Vitamin B 12 deficiency     Family History  Problem Relation Age of Onset  . Hypertension Mother        family hx  . Lung cancer Father  family hx  . Stroke Other        family hx  . Heart disease Sister        family hx    Past Surgical History:  Procedure Laterality Date  . BALLOON DILATION N/A 06/28/2014   Procedure: BALLOON DILATION;  Surgeon: Ladene Artist, MD;  [facility]: [facility];  Service: Endoscopy;  Laterality: N/A;  . BOTOX INJECTION N/A 07/09/2013   Procedure: BOTOX INJECTION;  Surgeon: Ladene Artist, MD;  [facility];  Service: Endoscopy;  Laterality: N/A;  . BOTOX INJECTION N/A 06/28/2014   Procedure: BOTOX INJECTION;  Surgeon: Ladene Artist, MD;  [facility];   Service: Endoscopy;  Laterality: N/A;  . CATARACT EXTRACTION W/ INTRAOCULAR LENS  IMPLANT, BILATERAL Bilateral 08-2015-10/2015  . ESOPHAGEAL MANOMETRY N/A 10/27/2012   Procedure: ESOPHAGEAL MANOMETRY (EM);  Surgeon: Ladene Artist, MD;  [facility];  Service: Endoscopy;  Laterality: N/A;  . ESOPHAGOGASTRODUODENOSCOPY N/A 07/09/2013   Procedure: ESOPHAGOGASTRODUODENOSCOPY (EGD);  Surgeon: Ladene Artist, MD;  Location: [facility];  Service: Endoscopy;  Laterality: N/A;  . ESOPHAGOGASTRODUODENOSCOPY N/A 10/07/2013   Procedure: ESOPHAGOGASTRODUODENOSCOPY (EGD);  Surgeon: Ladene Artist, MD;  Location: [facility];  Service: Endoscopy;  Laterality: N/A;  wtih botox  . ESOPHAGOGASTRODUODENOSCOPY (EGD) WITH PROPOFOL N/A 06/28/2014   Procedure: ESOPHAGOGASTRODUODENOSCOPY (EGD) WITH PROPOFOL;  Surgeon: Ladene Artist, MD;  [facility];  Service: Endoscopy;  Laterality: N/A;  . MASTECTOMY Left 1989  . ORIF FEMUR FRACTURE Left 06/16/2018   Procedure: OPEN REDUCTION INTERNAL FIXATION (ORIF) DISTAL FEMUR FRACTURE;  Surgeon: Leandrew Koyanagi, MD;  Location: [facility];  Service: Orthopedics;  Laterality: Left;   Social History   Occupational History  . Not on file  Tobacco Use  . Smoking status: Never Smoker  . Smokeless tobacco: Never Used  Substance and Sexual Activity  . Alcohol use: No  . Drug use: No  . Sexual activity: Not Currently

## 2018-08-04 ENCOUNTER — Ambulatory Visit: Payer: Self-pay

## 2018-08-04 NOTE — Telephone Encounter (Signed)
Incoming call from [place]  From [place].  Who  States that the Patient became  weak and at that time therapist lower her down on her leg and then placed her on the couch.  Patient was  unconscious for about 3 to 5 min. Orthostatics  Were:  Siting 121/74,   94/50 .  Patient has lost a lot of weight . Did not sustain injury.   Therapist wanted provider to be aware.            [place] with   [facility].  Answer Assessment - Initial Assessment Questions 1. ONSET: "How long were you unconscious?" (minutes) "When did it happen?"     Patient was"passed out  2. CONTENT: "What happened during period of unconsciousness?" (e.g., seizure activity)      *No Answer* 3. MENTAL STATUS: "Alert and oriented now?" (oriented x 3 = name, month, location)      *No Answer* 4. TRIGGER: "What do you think caused the fainting?" "What were  you doing just before you fainted?"  (e.g., exercise, sudden standing up, prolonged standing)     PT feel Patient is not hydrating well.   5. RECURRENT SYMPTOM: "Have you ever passed out before?" If so, ask: "When was the last time?" and "What happened that time?"      *No Answer* 6. INJURY: "Did you sustain any injury during the fall?"      no 7. CARDIAC SYMPTOMS: "Have you had any of the following symptoms: chest pain, difficulty breathing, palpitations?"      8. NEUROLOGIC SYMPTOMS: "Have you had any of the following symptoms: headache, numbness, vertigo, weakness?"     *No Answer* 9. GI SYMPTOMS: "Have you had any of the following symptoms: abdominal pain, vomiting, diarrhea, blood in stools?"     *No Answer* 10. OTHER SYMPTOMS: "Do you have any other symptoms?"       *No Answer* 11. PREGNANCY: "Is there any chance you are pregnant?" "When was your last menstrual period?"       *No Answer*  Protocols used: [facility]

## 2018-08-06 ENCOUNTER — Other Ambulatory Visit: Payer: Self-pay | Admitting: Adult Health

## 2018-08-06 ENCOUNTER — Encounter: Payer: Self-pay | Admitting: Adult Health

## 2018-08-06 ENCOUNTER — Ambulatory Visit (INDEPENDENT_AMBULATORY_CARE_PROVIDER_SITE_OTHER): Payer: Medicare Other | Admitting: Adult Health

## 2018-08-06 VITALS — BP 104/60 | Temp 98.4°F | Wt 90.0 lb

## 2018-08-06 DIAGNOSIS — R634 Abnormal weight loss: Secondary | ICD-10-CM | POA: Diagnosis not present

## 2018-08-06 DIAGNOSIS — I951 Orthostatic hypotension: Secondary | ICD-10-CM

## 2018-08-06 DIAGNOSIS — R410 Disorientation, unspecified: Secondary | ICD-10-CM

## 2018-08-06 LAB — POCT URINALYSIS DIPSTICK
Bilirubin, UA: NEGATIVE
Blood, UA: NEGATIVE
Glucose, UA: NEGATIVE
Ketones, UA: NEGATIVE
Leukocytes, UA: NEGATIVE
Nitrite, UA: NEGATIVE
Protein, UA: NEGATIVE
Spec Grav, UA: 1.025 (ref 1.010–1.025)
Urobilinogen, UA: 0.2 E.U./dL
pH, UA: 5.5 (ref 5.0–8.0)

## 2018-08-06 MED ORDER — MIDODRINE HCL 5 MG PO TABS: 5.0000 mg | ORAL_TABLET | Freq: Three times a day (TID) | ORAL | 1 refills | Status: AC

## 2018-08-06 NOTE — Addendum Note (Signed)
Addended by: Gwynne Edinger on: 08/06/2018 05:11 PM   Modules accepted: Orders

## 2018-08-06 NOTE — Progress Notes (Signed)
Subjective:    Patient ID: Veronica Phillips, female    DOB: [birth year], 85 y.o.   MRN: [id number]  HPI 85 year old female who  has a past medical history of Arthritis, Atrophic gastritis, Cancer of left breast ([place]) (1989), CKD (chronic kidney disease), stage III ([place]), Diverticulosis of colon, GERD (gastroesophageal reflux disease), Glaucoma, Headache, Hypertension, Lactose intolerance, Pernicious anemia, and Vitamin B 12 deficiency.  She presents to the office today for two week follow up regarding orthostatic hypotension. She was started on Midodrine 2.5 mg during her last visit as she was having issues with syncope during changes in position. At home her blood pressure readings had been low in the 92-330 systolic.    Today family reports that the patient continues to have low blood pressure readings at home when changing positions but she is no longer feeling dizzy or having issues with syncope. They forgot to bring the blood pressure log with them.   Family also reports that her appetite has improved and she is sleeping better since the increase in Remeron from 7.5 mg to 15 mg   Wt Readings from Last 3 Encounters:  08/01/18 101 lb (45.8 kg)  06/17/18 101 lb 6.6 oz (46 kg)  03/21/18 102 lb (46.3 kg)   Review of Systems See HPI   Past Medical History:  Diagnosis Date  . Arthritis    "knees, shoulders" (12/29/2015)  . Atrophic gastritis   . Cancer of left breast ([place]) 1989  . CKD (chronic kidney disease), stage III ([place])   . Diverticulosis of colon   . GERD (gastroesophageal reflux disease)   . Glaucoma   . Headache    "awful headaches when I was coming up in my teens"  . Hypertension   . Lactose intolerance   . Pernicious anemia   . Vitamin B 12 deficiency     Social History   Socioeconomic History  . Marital status: Widowed    Spouse name: Not on file  . Number of children: Not on file  . Years of education: Not on file  . Highest education level: Not on file    Occupational History  . Not on file  Social Needs  . Financial resource strain: Not on file  . Food insecurity:    Worry: Not on file    Inability: Not on file  . Transportation needs:    Medical: Not on file    Non-medical: Not on file  Tobacco Use  . Smoking status: Never Smoker  . Smokeless tobacco: Never Used  Substance and Sexual Activity  . Alcohol use: No  . Drug use: No  . Sexual activity: Not Currently  Lifestyle  . Physical activity:    Days per week: Not on file    Minutes per session: Not on file  . Stress: Not on file  Relationships  . Social connections:    Talks on phone: Not on file    Gets together: Not on file    Attends religious service: Not on file    Active member of club or organization: Not on file    Attends meetings of clubs or organizations: Not on file    Relationship status: Not on file  . Intimate partner violence:    Fear of current or ex partner: Not on file    Emotionally abused: Not on file    Physically abused: Not on file    Forced sexual activity: Not on file  Other Topics Concern  .  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 BALLOON DILATION N/A 06/28/2014   Procedure: BALLOON DILATION;  Surgeon: Ladene Artist, MD;  [facility];  Service: Endoscopy;  Laterality: N/A;  . BOTOX INJECTION N/A 07/09/2013   Procedure: BOTOX INJECTION;  Surgeon: Ladene Artist, MD;  [facility];  Service: Endoscopy;  Laterality: N/A;  . BOTOX INJECTION N/A 06/28/2014   Procedure: BOTOX INJECTION;  Surgeon: Ladene Artist, MD;  [facility];  Service: Endoscopy;  Laterality: N/A;  . CATARACT EXTRACTION W/ INTRAOCULAR LENS  IMPLANT, BILATERAL Bilateral 08-2015-10/2015  . ESOPHAGEAL MANOMETRY N/A 10/27/2012   Procedure: ESOPHAGEAL MANOMETRY (EM);  Surgeon: Ladene Artist, MD;  [facility];  Service: Endoscopy;  Laterality:  N/A;  . ESOPHAGOGASTRODUODENOSCOPY N/A 07/09/2013   Procedure: ESOPHAGOGASTRODUODENOSCOPY (EGD);  Surgeon: Ladene Artist, MD;  Location: [facility];  Service: Endoscopy;  Laterality: N/A;  . ESOPHAGOGASTRODUODENOSCOPY N/A 10/07/2013   Procedure: ESOPHAGOGASTRODUODENOSCOPY (EGD);  Surgeon: Ladene Artist, MD;  Location: [facility];  Service: Endoscopy;  Laterality: N/A;  wtih botox  . ESOPHAGOGASTRODUODENOSCOPY (EGD) WITH PROPOFOL N/A 06/28/2014   Procedure: ESOPHAGOGASTRODUODENOSCOPY (EGD) WITH PROPOFOL;  Surgeon: Ladene Artist, MD;  [facility];  Service: Endoscopy;  Laterality: N/A;  . MASTECTOMY Left 1989  . ORIF FEMUR FRACTURE Left 06/16/2018   Procedure: OPEN REDUCTION INTERNAL FIXATION (ORIF) DISTAL FEMUR FRACTURE;  Surgeon: Leandrew Koyanagi, MD;  Location: [facility];  Service: Orthopedics;  Laterality: Left;    Family History  Problem Relation Age of Onset  . Hypertension Mother        family hx  . Lung cancer Father        family hx  . Stroke Other        family hx  . Heart disease Sister        family hx    Allergies  Allergen Reactions  . Septra [Bactrim] Swelling    Site of swelling not recalled by the patient  . Lactose Intolerance (Gi) Diarrhea  . Sulfa Antibiotics Swelling    Site of swelling not recalled by the patient  . Tape Other (See Comments)    Skin is sensitive; please use paper tape!!    Current Outpatient Medications on File Prior to Visit  Medication Sig Dispense Refill  . acetaminophen (TYLENOL) 500 MG tablet Take 500 mg by mouth every 6 (six) hours as needed (for pain).     . Ascorbic Acid (VITAMIN C PO) Take 1 tablet by mouth at bedtime.     Marland Kitchen BIOTIN PO Take 1 tablet by mouth every evening.    . Cyanocobalamin (VITAMIN B-12 PO) Take 1 tablet by mouth at bedtime.     . docusate (COLACE) 50 MG/5ML liquid Take 10 mLs (100 mg total) by mouth 2 (two) times daily.    . ferrous gluconate (FERGON) 325 MG tablet Take 325 mg by mouth daily after  supper.     . lactose free nutrition (BOOST PLUS) LIQD Take 237 mLs by mouth 3 (three) times daily with meals.  0  . latanoprost (XALATAN) 0.005 % ophthalmic solution Place 1 drop into both eyes at bedtime.   0  . midodrine (PROAMATINE) 2.5 MG tablet Take 1 tablet (2.5 mg total) by mouth 3 (three) times daily with meals for 30 days. 90 tablet 0  . mirtazapine (REMERON) 15  MG tablet Take 1 tablet (15 mg total) by mouth at bedtime. CAN BE SEDATING. 90 tablet 1  . Multiple Vitamins-Minerals (ONE-A-DAY WOMENS 50 PLUS PO) Take 1 tablet by mouth daily after supper.     . multivitamin-lutein (OCUVITE-LUTEIN) CAPS capsule Take 1 capsule by mouth daily.    Marland Kitchen omeprazole (PRILOSEC) 20 MG capsule Take 1 capsule (20 mg total) by mouth daily. 90 capsule 3  . polyethylene glycol (MIRALAX / GLYCOLAX) packet Take 17 g by mouth daily as needed for mild constipation. 14 each 0  . potassium chloride (K-DUR) 10 MEQ tablet Take 1 tablet (10 mEq total) by mouth daily. 90 tablet 1  . vitamin E 400 UNIT capsule Take 400 Units by mouth at bedtime.      No current facility-administered medications on file prior to visit.     BP 104/60   Temp 98.4 F (36.9 C)       Objective:   Physical Exam Vitals signs and nursing note reviewed.  Constitutional:      Appearance: Normal appearance.  Cardiovascular:     Rate and Rhythm: Normal rate and regular rhythm.     Pulses: Normal pulses.     Heart sounds: Normal heart sounds.  Pulmonary:     Effort: Pulmonary effort is normal.     Breath sounds: Normal breath sounds.  Skin:    Capillary Refill: Capillary refill takes less than 2 seconds.  Neurological:     General: No focal deficit present.     Mental Status: She is alert and oriented to person, place, and time.  Psychiatric:        Mood and Affect: Mood normal.        Behavior: Behavior normal.        Thought Content: Thought content normal.        Judgment: Judgment normal.       Assessment & Plan:  1.  Orthostatic hypotension - Will increase midodrine to 5 mg TID. Follow up if syncope continues or blood pressure continues to be below 889 systolic  - midodrine (PROAMATINE) 5 MG tablet; Take 1 tablet (5 mg total) by mouth 3 (three) times daily with meals for 30 days.  Dispense: 90 tablet; Refill: 1  2. Unintentional weight loss - Continue with Remeron 15 mg  - Falls precautions advised. Her daughter is sleeping in the same room    Dorothyann Peng, NP

## 2018-08-07 NOTE — Telephone Encounter (Signed)
Denied.  Veronica Phillips filled on 08/06/2018.

## 2018-08-12 DIAGNOSIS — S72452D Displaced supracondylar fracture without intracondylar extension of lower end of left femur, subsequent encounter for closed fracture with routine healing: Secondary | ICD-10-CM | POA: Diagnosis not present

## 2018-08-12 DIAGNOSIS — F0280 Dementia in other diseases classified elsewhere without behavioral disturbance: Secondary | ICD-10-CM | POA: Diagnosis not present

## 2018-08-12 DIAGNOSIS — L89153 Pressure ulcer of sacral region, stage 3: Secondary | ICD-10-CM | POA: Diagnosis not present

## 2018-08-12 DIAGNOSIS — I951 Orthostatic hypotension: Secondary | ICD-10-CM | POA: Diagnosis not present

## 2018-08-12 DIAGNOSIS — E538 Deficiency of other specified B group vitamins: Secondary | ICD-10-CM | POA: Diagnosis not present

## 2018-08-12 DIAGNOSIS — D649 Anemia, unspecified: Secondary | ICD-10-CM | POA: Diagnosis not present

## 2018-08-12 DIAGNOSIS — I129 Hypertensive chronic kidney disease with stage 1 through stage 4 chronic kidney disease, or unspecified chronic kidney disease: Secondary | ICD-10-CM | POA: Diagnosis not present

## 2018-08-12 DIAGNOSIS — K219 Gastro-esophageal reflux disease without esophagitis: Secondary | ICD-10-CM | POA: Diagnosis not present

## 2018-08-12 DIAGNOSIS — E46 Unspecified protein-calorie malnutrition: Secondary | ICD-10-CM | POA: Diagnosis not present

## 2018-08-12 DIAGNOSIS — G309 Alzheimer's disease, unspecified: Secondary | ICD-10-CM | POA: Diagnosis not present

## 2018-08-12 DIAGNOSIS — H409 Unspecified glaucoma: Secondary | ICD-10-CM | POA: Diagnosis not present

## 2018-08-12 DIAGNOSIS — N183 Chronic kidney disease, stage 3 (moderate): Secondary | ICD-10-CM | POA: Diagnosis not present

## 2018-08-12 DIAGNOSIS — K5900 Constipation, unspecified: Secondary | ICD-10-CM | POA: Diagnosis not present

## 2018-08-14 DIAGNOSIS — S72452D Displaced supracondylar fracture without intracondylar extension of lower end of left femur, subsequent encounter for closed fracture with routine healing: Secondary | ICD-10-CM | POA: Diagnosis not present

## 2018-08-25 ENCOUNTER — Telehealth: Payer: Self-pay

## 2018-08-25 DIAGNOSIS — R8290 Unspecified abnormal findings in urine: Secondary | ICD-10-CM

## 2018-08-25 NOTE — Telephone Encounter (Signed)
Copied from [place] [id number]. Topic: General - Other >> Aug 25, 2018  9:27 AM Oneta Rack wrote: Caller Name: Grabbe,Jean Relation to pt: daughter  Call back number: [phone]  Reason for call:  Daughter states patient experiences frequent UTI's, requesting urine orders only, please advise

## 2018-08-26 ENCOUNTER — Other Ambulatory Visit: Payer: Medicare Other

## 2018-08-26 NOTE — Telephone Encounter (Signed)
Pt now scheduled on the lab.  Nothing further needed.

## 2018-08-26 NOTE — Telephone Encounter (Signed)
Ok for orders? 

## 2018-08-26 NOTE — Telephone Encounter (Signed)
Left a message for Veronica Phillips to call back.  CRM created.  Pt needs to be placed on lab schedule and Veronica Phillips can bring in a urine specimen.

## 2018-08-27 ENCOUNTER — Telehealth: Payer: Self-pay | Admitting: Adult Health

## 2018-08-27 ENCOUNTER — Other Ambulatory Visit (INDEPENDENT_AMBULATORY_CARE_PROVIDER_SITE_OTHER): Payer: Medicare Other

## 2018-08-27 DIAGNOSIS — R8290 Unspecified abnormal findings in urine: Secondary | ICD-10-CM | POA: Diagnosis not present

## 2018-08-27 LAB — POC URINALSYSI DIPSTICK (AUTOMATED)
Bilirubin, UA: NEGATIVE
Blood, UA: NEGATIVE
Glucose, UA: NEGATIVE
Ketones, UA: NEGATIVE
Leukocytes, UA: NEGATIVE
NITRITE UA: NEGATIVE
Protein, UA: NEGATIVE
Spec Grav, UA: 1.02 (ref 1.010–1.025)
Urobilinogen, UA: 0.2 E.U./dL
pH, UA: 5.5 (ref 5.0–8.0)

## 2018-08-27 NOTE — Telephone Encounter (Signed)
How have her blood pressures been over the week?

## 2018-08-27 NOTE — Telephone Encounter (Signed)
Patient returning call to [facility]. Please advise.

## 2018-08-27 NOTE — Telephone Encounter (Signed)
Allahna Husband dropped off urine for the patient today and wanted to let [name] know the the passed out this morning and was out a little longer than usual. Her BP was low.   Please Advise

## 2018-08-27 NOTE — Telephone Encounter (Signed)
Left a message for a return call.

## 2018-08-28 NOTE — Telephone Encounter (Signed)
Spoke to Veronica Phillips.  She informed me that before the pt gets out of bed in the morning her BP is fine.  Once she is up and mobile then it drops low.  [name] was not at home so she is unable to give me exact readings.  Continues to take the midodrine but only once or twice daily.  Unable to take TID because she sleeps so much.  Please advise.

## 2018-08-29 NOTE — Telephone Encounter (Signed)
Spoke to [place].  She stated the pt's bp at 10:57 AM (laying down) was 112/61.  At 2:55 this afternoon (laying down) it was 155/80.  At 3:25 she was sitting up and it was 77/46.  She only had the 1 syncopal episode since starting the medication two days ago.  Instructed [name] to give the medication at least twice daily but preferably as prescribed.  [name] will monitor BP over the weekend and I will call her back on Tuesday.

## 2018-08-29 NOTE — Telephone Encounter (Signed)
Have her take atleast twice a day so that we are getting some moderate effect of the medication. Please monitor BP when at rest and ambulatory over the weekend and let me know what they are  Was this last time the first syncopal episode she has had since starting midodrine?

## 2018-09-02 ENCOUNTER — Ambulatory Visit (INDEPENDENT_AMBULATORY_CARE_PROVIDER_SITE_OTHER): Payer: Medicare Other | Admitting: Adult Health

## 2018-09-02 ENCOUNTER — Encounter: Payer: Self-pay | Admitting: Adult Health

## 2018-09-02 ENCOUNTER — Ambulatory Visit: Payer: Self-pay

## 2018-09-02 VITALS — BP 106/56 | HR 70 | Temp 98.2°F

## 2018-09-02 DIAGNOSIS — R4189 Other symptoms and signs involving cognitive functions and awareness: Secondary | ICD-10-CM | POA: Diagnosis not present

## 2018-09-02 DIAGNOSIS — H401131 Primary open-angle glaucoma, bilateral, mild stage: Secondary | ICD-10-CM | POA: Diagnosis not present

## 2018-09-02 MED ORDER — RISPERIDONE 0.25 MG PO TABS: 0.2500 mg | ORAL_TABLET | Freq: Every day | ORAL | 0 refills | Status: DC

## 2018-09-02 NOTE — Patient Instructions (Signed)
I am going to have you decreased Remeron from 15 mg to 7.5 mg - 1/2 tab at night x 2 weeks   Start Risperidone tonight with the Remeron 7.5 mg   I will follow up with you on Friday

## 2018-09-02 NOTE — Progress Notes (Signed)
Subjective:    Patient ID: Veronica Phillips, female    DOB: 1923-06-17, 85 y.o.   MRN: [id number]  HPI 85 year old female who  has a past medical history of Arthritis, Atrophic gastritis, Cancer of left breast ([place]) (1989), CKD (chronic kidney disease), stage III ([place]), Diverticulosis of colon, GERD (gastroesophageal reflux disease), Glaucoma, Headache, Hypertension, Lactose intolerance, Pernicious anemia, and Vitamin B 12 deficiency.  She presents in the office today with her daughter for the complaint of insomnia, agitation, and hallucinations that have become worse over the last 2-3 weeks. Her daughter reports that the patient has has become more agitated at her, especially at night.   She has been giving her Remeron 15 mg at 9-10 pm and she will not fall asleep until 3-4 am .    Review of Systems See HPI   Past Medical History:  Diagnosis Date  . Arthritis    "knees, shoulders" (12/29/2015)  . Atrophic gastritis   . Cancer of left breast ([place]) 1989  . CKD (chronic kidney disease), stage III ([place])   . Diverticulosis of colon   . GERD (gastroesophageal reflux disease)   . Glaucoma   . Headache    "awful headaches when I was coming up in my teens"  . Hypertension   . Lactose intolerance   . Pernicious anemia   . Vitamin B 12 deficiency     Social History   Socioeconomic History  . Marital status: Widowed    Spouse name: Not on file  . Number of children: Not on file  . Years of education: Not on file  . Highest education level: Not on file  Occupational History  . Not on file  Social Needs  . Financial resource strain: Not on file  . Food insecurity:    Worry: Not on file    Inability: Not on file  . Transportation needs:    Medical: Not on file    Non-medical: Not on file  Tobacco Use  . Smoking status: Never Smoker  . Smokeless tobacco: Never Used  Substance and Sexual Activity  . Alcohol use: No  . Drug use: No  . Sexual activity: Not Currently  Lifestyle    . Physical activity:    Days per week: Not on file    Minutes per session: Not on file  . Stress: Not on file  Relationships  . Social connections:    Talks on phone: Not on file    Gets together: Not on file    Attends religious service: Not on file    Active member of club or organization: Not on file    Attends meetings of clubs or organizations: Not on file    Relationship status: Not on file  . Intimate partner violence:    Fear of current or ex partner: Not on file    Emotionally abused: Not on file    Physically abused: Not on file    Forced sexual activity: Not on file  Other Topics Concern  . 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 BALLOON DILATION N/A 06/28/2014   Procedure: BALLOON DILATION;  Surgeon: Ladene Artist, MD;  [facility]: [facility];  Service: Endoscopy;  Laterality: N/A;  . BOTOX INJECTION N/A 07/09/2013   Procedure: BOTOX INJECTION;  Surgeon: Ladene Artist, MD;  [facility];  Service: Endoscopy;  Laterality: N/A;  . BOTOX INJECTION N/A 06/28/2014   Procedure: BOTOX INJECTION;  Surgeon: Ladene Artist, MD;  [facility];  Service: Endoscopy;  Laterality: N/A;  . CATARACT EXTRACTION W/ INTRAOCULAR LENS  IMPLANT, BILATERAL Bilateral 08-2015-10/2015  . ESOPHAGEAL MANOMETRY N/A 10/27/2012   Procedure: ESOPHAGEAL MANOMETRY (EM);  Surgeon: Ladene Artist, MD;  [facility];  Service: Endoscopy;  Laterality: N/A;  . ESOPHAGOGASTRODUODENOSCOPY N/A 07/09/2013   Procedure: ESOPHAGOGASTRODUODENOSCOPY (EGD);  Surgeon: Ladene Artist, MD;  Location: [facility];  Service: Endoscopy;  Laterality: N/A;  . ESOPHAGOGASTRODUODENOSCOPY N/A 10/07/2013   Procedure: ESOPHAGOGASTRODUODENOSCOPY (EGD);  Surgeon: Ladene Artist, MD;  Location: [facility];  Service: Endoscopy;  Laterality: N/A;  wtih botox  . ESOPHAGOGASTRODUODENOSCOPY (EGD) WITH PROPOFOL N/A  06/28/2014   Procedure: ESOPHAGOGASTRODUODENOSCOPY (EGD) WITH PROPOFOL;  Surgeon: Ladene Artist, MD;  [facility];  Service: Endoscopy;  Laterality: N/A;  . MASTECTOMY Left 1989  . ORIF FEMUR FRACTURE Left 06/16/2018   Procedure: OPEN REDUCTION INTERNAL FIXATION (ORIF) DISTAL FEMUR FRACTURE;  Surgeon: Leandrew Koyanagi, MD;  Location: [facility];  Service: Orthopedics;  Laterality: Left;    Family History  Problem Relation Age of Onset  . Hypertension Mother        family hx  . Lung cancer Father        family hx  . Stroke Other        family hx  . Heart disease Sister        family hx    Allergies  Allergen Reactions  . Septra [Bactrim] Swelling    Site of swelling not recalled by the patient  . Lactose Intolerance (Gi) Diarrhea  . Sulfa Antibiotics Swelling    Site of swelling not recalled by the patient  . Tape Other (See Comments)    Skin is sensitive; please use paper tape!!    Current Outpatient Medications on File Prior to Visit  Medication Sig Dispense Refill  . acetaminophen (TYLENOL) 500 MG tablet Take 500 mg by mouth every 6 (six) hours as needed (for pain).     . Ascorbic Acid (VITAMIN C PO) Take 1 tablet by mouth at bedtime.     Marland Kitchen BIOTIN PO Take 1 tablet by mouth every evening.    . Cyanocobalamin (VITAMIN B-12 PO) Take 1 tablet by mouth at bedtime.     . docusate (COLACE) 50 MG/5ML liquid Take 10 mLs (100 mg total) by mouth 2 (two) times daily.    . ferrous gluconate (FERGON) 325 MG tablet Take 325 mg by mouth daily after supper.     . lactose free nutrition (BOOST PLUS) LIQD Take 237 mLs by mouth 3 (three) times daily with meals.  0  . latanoprost (XALATAN) 0.005 % ophthalmic solution Place 1 drop into both eyes at bedtime.   0  . midodrine (PROAMATINE) 5 MG tablet Take 1 tablet (5 mg total) by mouth 3 (three) times daily with meals for 30 days. 90 tablet 1  . mirtazapine (REMERON) 15 MG tablet Take 1 tablet (15 mg total) by mouth at bedtime. CAN BE SEDATING.  90 tablet 1  . Multiple Vitamins-Minerals (ONE-A-DAY WOMENS 50 PLUS PO) Take 1 tablet by mouth daily after supper.     . multivitamin-lutein (OCUVITE-LUTEIN) CAPS capsule Take 1 capsule by mouth daily.    Marland Kitchen omeprazole (PRILOSEC) 20 MG capsule Take 1 capsule (20 mg total) by mouth daily. 90 capsule 3  . polyethylene glycol (MIRALAX / GLYCOLAX) packet Take 17  g by mouth daily as needed for mild constipation. 14 each 0  . potassium chloride (K-DUR) 10 MEQ tablet Take 1 tablet (10 mEq total) by mouth daily. 90 tablet 1  . vitamin E 400 UNIT capsule Take 400 Units by mouth at bedtime.      No current facility-administered medications on file prior to visit.     There were no vitals taken for this visit.      Objective:   Physical Exam Vitals signs and nursing note reviewed.  Constitutional:      Appearance: Normal appearance.  Cardiovascular:     Rate and Rhythm: Normal rate and regular rhythm.     Pulses: Normal pulses.     Heart sounds: Normal heart sounds.  Pulmonary:     Effort: Pulmonary effort is normal.     Breath sounds: Normal breath sounds.  Neurological:     Mental Status: She is alert. Mental status is at baseline.  Psychiatric:        Attention and Perception: She perceives auditory and visual hallucinations.        Mood and Affect: Mood normal.        Speech: Speech is delayed.        Behavior: Behavior normal.        Cognition and Memory: Cognition is impaired. Memory is impaired.       Assessment & Plan:  1. Cognitive impairment - Will decreased remeron to 7.5 mg x 2 weeks and start on Risperidone for behavior disturbances  - risperiDONE (RISPERDAL) 0.25 MG tablet; Take 1 tablet (0.25 mg total) by mouth at bedtime for 30 days.  Dispense: 30 tablet; Refill: 0 - Will follow up with on Friday via phone call   Dorothyann Peng, NP

## 2018-09-02 NOTE — Telephone Encounter (Signed)
Patient daughter Veronica Phillips called to ask Dr [name] if the midodrine (PROAMATINE) 5 MG tablet could be affecting her behavior. Stated that since she started taking this medication she have not been herself, confused and not sleeping. Called daughter and pt is having increased irritability, insomnia and confusion. Pt given appt today with PCP today. Daughter verbalized understanding. Reason for Disposition . Caller has URGENT medication question about med that PCP prescribed and triager unable to answer question  Answer Assessment - Initial Assessment Questions 1. SYMPTOMS: "Do you have any symptoms?"     Confusion, insomnia 2. SEVERITY: If symptoms are present, ask "Are they mild, moderate or severe?"     severe  Protocols used: MEDICATION QUESTION CALL-A-AH

## 2018-09-02 NOTE — Telephone Encounter (Signed)
Pt seen today in [facility].  Nothing further to do.  See office visit note.

## 2018-09-12 ENCOUNTER — Ambulatory Visit (INDEPENDENT_AMBULATORY_CARE_PROVIDER_SITE_OTHER): Payer: Medicare Other | Admitting: Orthopaedic Surgery

## 2018-09-12 ENCOUNTER — Encounter (INDEPENDENT_AMBULATORY_CARE_PROVIDER_SITE_OTHER): Payer: Self-pay | Admitting: Orthopaedic Surgery

## 2018-09-12 ENCOUNTER — Other Ambulatory Visit: Payer: Self-pay

## 2018-09-12 ENCOUNTER — Ambulatory Visit (INDEPENDENT_AMBULATORY_CARE_PROVIDER_SITE_OTHER): Payer: Medicare Other

## 2018-09-12 DIAGNOSIS — S72452D Displaced supracondylar fracture without intracondylar extension of lower end of left femur, subsequent encounter for closed fracture with routine healing: Secondary | ICD-10-CM

## 2018-09-12 NOTE — Progress Notes (Signed)
Post-Op Visit Note   Patient: Veronica Phillips           Date of Birth: May 19, 1923           MRN: [id number] Visit Date: 09/12/2018 PCP: Dorothyann Peng, NP   Assessment & Plan:  Chief Complaint:  Chief Complaint  Patient presents with  . Left Knee - Pain   Visit Diagnoses:  1. Closed supracondylar fracture of left femur with routine healing, subsequent encounter     Plan: Alvis Lemmings is nearly 3 months status post retrograde femoral nail for supracondylar femur fracture.  She reports minimal pain.  She has not been ambulating much due to issues with orthostatic hypotension.  She did not walk that much before this injury.  Her exam is benign today.  She is in a wheelchair today.  Her x-rays do demonstrate continued healing of the fracture.  At this point I would like to see her back in another 3 months with 2 view x-rays of the left femur.  Hopefully her orthostatic hypotension will resolve so that she can rehab with PT.  Follow-Up Instructions: Return in about 3 months (around 12/13/2018).   Orders:  Orders Placed This Encounter  Procedures  . XR Knee 1-2 Views Left   No orders of the defined types were placed in this encounter.   Imaging: Xr Knee 1-2 Views Left  Result Date: 09/12/2018 Stable alignment and fixation of supracondylar femur fracture with evidence of bridging callus formation.   PMFS History: Patient Active Problem List   Diagnosis Date Noted  . Pressure injury of skin 06/16/2018  . Closed supracondylar fracture of left femur ([place]) June 17, 202019  . Fall at home, initial encounter June 17, 202019  . Recurrent UTI June 17, 202019  . Acute metabolic encephalopathy 37/16/9678  . Orthostatic hypotension 03/11/2018  . Acute encephalopathy 03/11/2018  . Bilateral primary osteoarthritis of knee 02/27/2018  . Weight loss 08/30/2017  . S/p left hip fracture 07/12/2017  . Subcoracoid bursitis, right 07/12/2017  . CKD (chronic kidney disease), stage III ([place]) 12/29/2015  .  Dizziness 12/29/2015  . Lower urinary tract infectious disease 12/29/2015  . Orthostatic syncope 12/29/2015  . Fall   . Chronic anemia   . Need for prophylactic vaccination against Streptococcus pneumoniae (pneumococcus) 01/06/2015  . Precordial chest pain 11/05/2014  . Dysphagia, pharyngoesophageal phase   . 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 Achalasia 07/07/2013  . Peripheral edema 05/19/2012  . Acute cystitis without hematuria 02/08/2011  . Glaucoma 07/17/2010  . GERD 07/17/2010  . OTHER DYSPHAGIA 07/17/2010  . PROBLEMS WITH SWALLOWING AND MASTICATION 07/11/2010  . IDIOPATHIC URTICARIA 04/11/2010  . ANEMIA, B12 DEFICIENCY 05/18/2008  . OSTEOARTHRITIS 05/12/2007  . DIVERTICULOSIS, COLON 02/26/2007   Past Medical History:  Diagnosis Date  . Arthritis    "knees, shoulders" (12/29/2015)  . Atrophic gastritis   . Cancer of left breast ([place]) 1989  . CKD (chronic kidney disease), stage III ([place])   . Diverticulosis of colon   . GERD (gastroesophageal reflux disease)   . Glaucoma   . Headache    "awful headaches when I was coming up in my teens"  . Hypertension   . Lactose intolerance   . Pernicious anemia   . Vitamin B 12 deficiency     Family History  Problem Relation Age of Onset  . Hypertension Mother        family hx  . Lung cancer Father        family hx  . Stroke Other  family hx  . Heart disease Sister        family hx    Past Surgical History:  Procedure Laterality Date  . BALLOON DILATION N/A 06/28/2014   Procedure: BALLOON DILATION;  Surgeon: Ladene Artist, MD;  [facility];  Service: Endoscopy;  Laterality: N/A;  . BOTOX INJECTION N/A 07/09/2013   Procedure: BOTOX INJECTION;  Surgeon: Ladene Artist, MD;  [facility];  Service: Endoscopy;  Laterality: N/A;  . BOTOX INJECTION N/A 06/28/2014   Procedure: BOTOX INJECTION;  Surgeon: Ladene Artist, MD;  [facility];  Service: Endoscopy;  Laterality: N/A;  . CATARACT  EXTRACTION W/ INTRAOCULAR LENS  IMPLANT, BILATERAL Bilateral 08-2015-10/2015  . ESOPHAGEAL MANOMETRY N/A 10/27/2012   Procedure: ESOPHAGEAL MANOMETRY (EM);  Surgeon: Ladene Artist, MD;  [facility];  Service: Endoscopy;  Laterality: N/A;  . ESOPHAGOGASTRODUODENOSCOPY N/A 07/09/2013   Procedure: ESOPHAGOGASTRODUODENOSCOPY (EGD);  Surgeon: Ladene Artist, MD;  Location: [facility];  Service: Endoscopy;  Laterality: N/A;  . ESOPHAGOGASTRODUODENOSCOPY N/A 10/07/2013   Procedure: ESOPHAGOGASTRODUODENOSCOPY (EGD);  Surgeon: Ladene Artist, MD;  Location: [facility];  Service: Endoscopy;  Laterality: N/A;  wtih botox  . ESOPHAGOGASTRODUODENOSCOPY (EGD) WITH PROPOFOL N/A 06/28/2014   Procedure: ESOPHAGOGASTRODUODENOSCOPY (EGD) WITH PROPOFOL;  Surgeon: Ladene Artist, MD;  [facility];  Service: Endoscopy;  Laterality: N/A;  . MASTECTOMY Left 1989  . ORIF FEMUR FRACTURE Left 06/16/2018   Procedure: OPEN REDUCTION INTERNAL FIXATION (ORIF) DISTAL FEMUR FRACTURE;  Surgeon: Leandrew Koyanagi, MD;  Location: [facility];  Service: Orthopedics;  Laterality: Left;   Social History   Occupational History  . Not on file  Tobacco Use  . Smoking status: Never Smoker  . Smokeless tobacco: Never Used  Substance and Sexual Activity  . Alcohol use: No  . Drug use: No  . Sexual activity: Not Currently

## 2018-09-30 ENCOUNTER — Other Ambulatory Visit: Payer: Self-pay | Admitting: Adult Health

## 2018-09-30 DIAGNOSIS — R4189 Other symptoms and signs involving cognitive functions and awareness: Secondary | ICD-10-CM

## 2018-09-30 NOTE — Telephone Encounter (Signed)
Ok to refill. Can you just ask daughter how she is doing on this medication?  Thanks

## 2018-10-01 NOTE — Telephone Encounter (Signed)
[name] says pt is doing well.  No complaints.

## 2018-10-01 NOTE — Telephone Encounter (Signed)
Sent to the pharmacy by e-scribe. 

## 2018-10-01 NOTE — Telephone Encounter (Signed)
Ok to refill for 90 days + 1

## 2018-10-13 DIAGNOSIS — S72452D Displaced supracondylar fracture without intracondylar extension of lower end of left femur, subsequent encounter for closed fracture with routine healing: Secondary | ICD-10-CM | POA: Diagnosis not present

## 2018-10-22 ENCOUNTER — Ambulatory Visit (INDEPENDENT_AMBULATORY_CARE_PROVIDER_SITE_OTHER): Payer: Medicare Other | Admitting: Adult Health

## 2018-10-22 ENCOUNTER — Other Ambulatory Visit: Payer: Self-pay

## 2018-10-22 ENCOUNTER — Ambulatory Visit: Payer: Self-pay | Admitting: Adult Health

## 2018-10-22 ENCOUNTER — Encounter: Payer: Self-pay | Admitting: Adult Health

## 2018-10-22 DIAGNOSIS — R441 Visual hallucinations: Secondary | ICD-10-CM | POA: Diagnosis not present

## 2018-10-22 NOTE — Progress Notes (Signed)
Virtual Visit via Video Note  I connected with Veronica Phillips on 10/22/18 at  2:00 PM EDT by a video enabled telemedicine application and verified that I am speaking with the correct person using two identifiers.  Location patient: home Location provider:work or home office Persons participating in the virtual visit: daughter and  provider  I discussed the limitations of evaluation and management by telemedicine and the availability of in person appointments. The patient expressed understanding and agreed to proceed.   HPI: Virtual visit with the patient's daughter today for increased visual hallucinations and confusion, increased sedation and agitation over the last 30 days.  Her daughter thinks that currently the symptoms are getting worse.  Reports that last night the patient asked her daughter "when the girl between the dresser is was going to leave".  She also reports that the patient became agitated at the family last night when they would not let her leave the house to go on a walk.  Patient remembered getting agitated this morning and apologized to the family.  She was last seen in the office in early March at which time she was started on Risperdal 0.25 mg nightly for agitation and hallucinations.    Reports that she has had no recent falls, UTI-like symptoms, and has a good appetite.  ROS: See pertinent positives and negatives per HPI.  Past Medical History:  Diagnosis Date  . Arthritis    "knees, shoulders" (12/29/2015)  . Atrophic gastritis   . Cancer of left breast ([place]) 1989  . CKD (chronic kidney disease), stage III ([place])   . Diverticulosis of colon   . GERD (gastroesophageal reflux disease)   . Glaucoma   . Headache    "awful headaches when I was coming up in my teens"  . Hypertension   . Lactose intolerance   . Pernicious anemia   . Vitamin B 12 deficiency     Past Surgical History:  Procedure Laterality Date  . BALLOON DILATION N/A 06/28/2014   Procedure: BALLOON  DILATION;  Surgeon: Ladene Artist, MD;  [facility];  Service: Endoscopy;  Laterality: N/A;  . BOTOX INJECTION N/A 07/09/2013   Procedure: BOTOX INJECTION;  Surgeon: Ladene Artist, MD;  [facility];  Service: Endoscopy;  Laterality: N/A;  . BOTOX INJECTION N/A 06/28/2014   Procedure: BOTOX INJECTION;  Surgeon: Ladene Artist, MD;  [facility];  Service: Endoscopy;  Laterality: N/A;  . CATARACT EXTRACTION W/ INTRAOCULAR LENS  IMPLANT, BILATERAL Bilateral 08-2015-10/2015  . ESOPHAGEAL MANOMETRY N/A 10/27/2012   Procedure: ESOPHAGEAL MANOMETRY (EM);  Surgeon: Ladene Artist, MD;  [facility];  Service: Endoscopy;  Laterality: N/A;  . ESOPHAGOGASTRODUODENOSCOPY N/A 07/09/2013   Procedure: ESOPHAGOGASTRODUODENOSCOPY (EGD);  Surgeon: Ladene Artist, MD;  Location: [facility];  Service: Endoscopy;  Laterality: N/A;  . ESOPHAGOGASTRODUODENOSCOPY N/A 10/07/2013   Procedure: ESOPHAGOGASTRODUODENOSCOPY (EGD);  Surgeon: Ladene Artist, MD;  Location: [facility];  Service: Endoscopy;  Laterality: N/A;  wtih botox  . ESOPHAGOGASTRODUODENOSCOPY (EGD) WITH PROPOFOL N/A 06/28/2014   Procedure: ESOPHAGOGASTRODUODENOSCOPY (EGD) WITH PROPOFOL;  Surgeon: Ladene Artist, MD;  [facility];  Service: Endoscopy;  Laterality: N/A;  . MASTECTOMY Left 1989  . ORIF FEMUR FRACTURE Left 06/16/2018   Procedure: OPEN REDUCTION INTERNAL FIXATION (ORIF) DISTAL FEMUR FRACTURE;  Surgeon: Leandrew Koyanagi, MD;  Location: [facility];  Service: Orthopedics;  Laterality: Left;    Family History  Problem Relation Age of Onset  . Hypertension Mother  family hx  . Lung cancer Father        family hx  . Stroke Other        family hx  . Heart disease Sister        family hx       Current Outpatient Medications:  .  acetaminophen (TYLENOL) 500 MG tablet, Take 500 mg by mouth every 6 (six) hours as needed (for pain). , Disp: , Rfl:  .  Ascorbic Acid (VITAMIN C PO), Take 1  tablet by mouth at bedtime. , Disp: , Rfl:  .  BIOTIN PO, Take 1 tablet by mouth every evening., Disp: , Rfl:  .  Cyanocobalamin (VITAMIN B-12 PO), Take 1 tablet by mouth at bedtime. , Disp: , Rfl:  .  docusate (COLACE) 50 MG/5ML liquid, Take 10 mLs (100 mg total) by mouth 2 (two) times daily., Disp: , Rfl:  .  ferrous gluconate (FERGON) 325 MG tablet, Take 325 mg by mouth daily after supper. , Disp: , Rfl:  .  lactose free nutrition (BOOST PLUS) LIQD, Take 237 mLs by mouth 3 (three) times daily with meals., Disp: , Rfl: 0 .  latanoprost (XALATAN) 0.005 % ophthalmic solution, Place 1 drop into both eyes at bedtime. , Disp: , Rfl: 0 .  Multiple Vitamins-Minerals (ONE-A-DAY WOMENS 50 PLUS PO), Take 1 tablet by mouth daily after supper. , Disp: , Rfl:  .  multivitamin-lutein (OCUVITE-LUTEIN) CAPS capsule, Take 1 capsule by mouth daily., Disp: , Rfl:  .  omeprazole (PRILOSEC) 20 MG capsule, Take 1 capsule (20 mg total) by mouth daily., Disp: 90 capsule, Rfl: 3 .  polyethylene glycol (MIRALAX / GLYCOLAX) packet, Take 17 g by mouth daily as needed for mild constipation., Disp: 14 each, Rfl: 0 .  potassium chloride (K-DUR) 10 MEQ tablet, Take 1 tablet (10 mEq total) by mouth daily., Disp: 90 tablet, Rfl: 1 .  risperiDONE (RISPERDAL) 0.25 MG tablet, TAKE 1 TABLET(0.25 MG) BY MOUTH AT BEDTIME (Patient taking differently: Take 0.5 mg by mouth at bedtime. ), Disp: 90 tablet, Rfl: 1 .  vitamin E 400 UNIT capsule, Take 400 Units by mouth at bedtime. , Disp: , Rfl:   EXAM:  This visit was with the patients daughter. Patient was asleep at time of visit    ASSESSMENT AND PLAN:  Discussed the following assessment and plan:  Visual hallucinations - Plan: Ambulatory referral to Neurology  -This is more related to dementia-like symptoms.  I am okay with her daughter increasing Risperdal to 0.5 mg nightly.  Daughter would also like to get a neurology consult for further evaluation and help.  I will place this  order today.  Her daughter was advised to follow-up if needed   I discussed the assessment and treatment plan with the patient. The patient was provided an opportunity to ask questions and all were answered. The patient agreed with the plan and demonstrated an understanding of the instructions.   The patient was advised to call back or seek an in-person evaluation if the symptoms worsen or if the condition fails to improve as anticipated.   Dorothyann Peng, NP

## 2018-10-22 NOTE — Telephone Encounter (Signed)
Pt. Started Risperdal 2-3 months ago for her mood per daughter. Reports 1 month after starting the medicine she started hallucinating and having "her heart racing at times." Reports it is getting worse and she was very irritable yesterday. Warm transferred to [facility] in the practice for virtual visit.  Answer Assessment - Initial Assessment Questions 1. SYMPTOMS: "Do you have any symptoms?"     Yes- hallucinations. Heart rate will "races at times." 2. SEVERITY: If symptoms are present, ask "Are they mild, moderate or severe?"     Severe  Protocols used: MEDICATION QUESTION CALL-A-AH

## 2018-10-31 ENCOUNTER — Other Ambulatory Visit: Payer: Self-pay

## 2018-10-31 ENCOUNTER — Telehealth (INDEPENDENT_AMBULATORY_CARE_PROVIDER_SITE_OTHER): Payer: Medicare Other | Admitting: Neurology

## 2018-10-31 DIAGNOSIS — F0391 Unspecified dementia with behavioral disturbance: Secondary | ICD-10-CM | POA: Diagnosis not present

## 2018-10-31 DIAGNOSIS — F03B18 Unspecified dementia, moderate, with other behavioral disturbance: Secondary | ICD-10-CM

## 2018-10-31 MED ORDER — MIRTAZAPINE 15 MG PO TABS: ORAL_TABLET | ORAL | 5 refills | Status: DC

## 2018-10-31 NOTE — Progress Notes (Signed)
Virtual Visit via Video Note The purpose of this virtual visit is to provide medical care while limiting exposure to the novel coronavirus.    Consent was obtained for video visit:  Yes.   Answered questions that patient had about telehealth interaction:  Yes.   I discussed the limitations, risks, security and privacy concerns of performing an evaluation and management service by telemedicine. I also discussed with the patient that there may be a patient responsible charge related to this service. The patient expressed understanding and agreed to proceed.  Pt location: Home Physician Location: office Name of referring provider:  Dorothyann Peng, NP I connected with CHARLEAN CARNEAL at patients initiation/request on 10/31/2018 at  2:30 PM EDT by video enabled telemedicine application and verified that I am speaking with the correct person using two identifiers. Pt MRN:  [id number] Pt DOB:  Jun 23, 1923 Video Participants:  Rhea Belton;  Jack Quarto (daughter)   History of Present Illness:  The patient is a 85 year old right-handed woman with a history of orthostatic hypotension, breast cancer, presenting for evaluation of hallucinations. She states she is feeling pretty good. She is a poor historian and hard of hearing, history is obtained from her daughter [name]. [name] reports that she had been living with [name] for the past 3-4 years. She was previously living alone. She stopped driving at age 36. She continued to manage her own medications and finances until a year ago, when she started forgetting more. [name] has been administering medications and managing finances. She is able to dress and bathe herself independently. [name] reports hallucinations started 3 months ago, she would be seeing children and talk to them. These were not bothersome or scary for her. Family reports she talks a lot, which is not her personality. She sometimes thinks her husband is alive and "goes back in time." She has not slept  well in years, but worse recently. [name] reported insomnia, agitation, worse at night on her visit with PCP in March, despite taking Remeron. Remeron dose was reduced and risperdal started. Risperal dose increased to 0.5mg  qhs last week, Remeron stopped. Her daughter has not noticed much difference in the past week. She occasionally gets stubborn and angry with daughter. [name] reports she would wander around the house. She has a history of falls in the past from orthostatic hypotension and fractured her distal femur in December. She ambulates with a cane. There is no family history of dementia, no history of significant head injuries or alcohol use.    I personally reviewed head CT without contrast done 02/2018 which did not show any acute changes, there was mild diffuse atrophy.  Laboratory Data: Lab Results  Component Value Date   TSH 3.51 05/21/2018   Lab Results  Component Value Date   VITAMINB12 >1500 (H) 03/27/2016    PAST MEDICAL HISTORY: Past Medical History:  Diagnosis Date   Arthritis    "knees, shoulders" (12/29/2015)   Atrophic gastritis    Cancer of left breast ([place]) 1989   CKD (chronic kidney disease), stage III (HCC)    Diverticulosis of colon    GERD (gastroesophageal reflux disease)    Glaucoma    Headache    "awful headaches when I was coming up in my teens"   Hypertension    Lactose intolerance    Orthostatic hypotension    Pernicious anemia    Vitamin B 12 deficiency     PAST SURGICAL HISTORY: Past Surgical History:  Procedure Laterality Date  BALLOON DILATION N/A 06/28/2014   Procedure: BALLOON DILATION;  Surgeon: Ladene Artist, MD;  Location: [facility];  Service: Endoscopy;  Laterality: N/A;   BOTOX INJECTION N/A 07/09/2013   Procedure: BOTOX INJECTION;  Surgeon: Ladene Artist, MD;  Location: [facility];  Service: Endoscopy;  Laterality: N/A;   BOTOX INJECTION N/A 06/28/2014   Procedure: BOTOX INJECTION;  Surgeon: Ladene Artist, MD;   [facility]: [facility];  Service: Endoscopy;  Laterality: N/A;   CATARACT EXTRACTION W/ INTRAOCULAR LENS  IMPLANT, BILATERAL Bilateral 08-2015-10/2015   ESOPHAGEAL MANOMETRY N/A 10/27/2012   Procedure: ESOPHAGEAL MANOMETRY (EM);  Surgeon: Ladene Artist, MD;  [facility];  Service: Endoscopy;  Laterality: N/A;   ESOPHAGOGASTRODUODENOSCOPY N/A 07/09/2013   Procedure: ESOPHAGOGASTRODUODENOSCOPY (EGD);  Surgeon: Ladene Artist, MD;  Location: [facility];  Service: Endoscopy;  Laterality: N/A;   ESOPHAGOGASTRODUODENOSCOPY N/A 10/07/2013   Procedure: ESOPHAGOGASTRODUODENOSCOPY (EGD);  Surgeon: Ladene Artist, MD;  Location: [facility];  Service: Endoscopy;  Laterality: N/A;  wtih botox   ESOPHAGOGASTRODUODENOSCOPY (EGD) WITH PROPOFOL N/A 06/28/2014   Procedure: ESOPHAGOGASTRODUODENOSCOPY (EGD) WITH PROPOFOL;  Surgeon: Ladene Artist, MD;  [facility];  Service: Endoscopy;  Laterality: N/A;   MASTECTOMY Left 1989   ORIF FEMUR FRACTURE Left 06/16/2018   Procedure: OPEN REDUCTION INTERNAL FIXATION (ORIF) DISTAL FEMUR FRACTURE;  Surgeon: Leandrew Koyanagi, MD;  Location: [facility];  Service: Orthopedics;  Laterality: Left;    MEDICATIONS: Current Outpatient Medications on File Prior to Visit  Medication Sig Dispense Refill   acetaminophen (TYLENOL) 500 MG tablet Take 500 mg by mouth every 6 (six) hours as needed (for pain).      Ascorbic Acid (VITAMIN C PO) Take 1 tablet by mouth at bedtime.      BIOTIN PO Take 1 tablet by mouth every evening.     Cyanocobalamin (VITAMIN B-12 PO) Take 1 tablet by mouth at bedtime.      ferrous gluconate (FERGON) 325 MG tablet Take 325 mg by mouth daily after supper.      latanoprost (XALATAN) 0.005 % ophthalmic solution Place 1 drop into both eyes at bedtime.   0   midodrine (PROAMATINE) 5 MG tablet Take 5 mg by mouth 3 (three) times daily with meals.     Multiple Vitamins-Minerals (ONE-A-DAY WOMENS 50 PLUS PO) Take 1 tablet by mouth  daily after supper.      multivitamin-lutein (OCUVITE-LUTEIN) CAPS capsule Take 1 capsule by mouth daily.     omeprazole (PRILOSEC) 20 MG capsule Take 1 capsule (20 mg total) by mouth daily. 90 capsule 3   potassium chloride (K-DUR) 10 MEQ tablet Take 1 tablet (10 mEq total) by mouth daily. 90 tablet 1   risperiDONE (RISPERDAL) 0.25 MG tablet TAKE 1 TABLET(0.25 MG) BY MOUTH AT BEDTIME (Patient taking differently: Take 0.5 mg by mouth at bedtime. ) 90 tablet 1   vitamin E 400 UNIT capsule Take 400 Units by mouth at bedtime.      No current facility-administered medications on file prior to visit.     ALLERGIES: Allergies  Allergen Reactions   Septra [Bactrim] Swelling    Site of swelling not recalled by the patient   Lactose Intolerance (Gi) Diarrhea   Sulfa Antibiotics Swelling    Site of swelling not recalled by the patient   Tape Other (See Comments)    Skin is sensitive; please use paper tape!!    FAMILY HISTORY: Family History  Problem Relation Age of Onset   Hypertension Mother  family hx   Lung cancer Father        family hx   Stroke Other        family hx   Heart disease Sister        family hx      Observations/Objective:   GEN:  The patient appears stated age and is in NAD. Neurological examination: She is awake, alert, oriented to person, place (home, [place], [place]). States it is November, Fall, year is "20." She is hard of hearing but can follow commands. She can name, difficulty repeating. She has difficulty saying "daisy." She can spell WORLD but cannot spell it backward. 0/3 delayed recall. Cranial nerves: There is good facial symmetry. There is no facial hypomimia.  Motor: Strength is at least antigravity x 4. There is no pronator drift. Gait:hunched with cane, appears to favor the left side but family denies any focal weakness, she has arthritis in both knees.  Assessment and Plan:   This is a 85 year old right-handed woman with orthostatic  hypotension, breast cancer, presenting for evaluation of visual hallucinations. Memory changes started several years ago, more noticeable in the past year where she has needed more help with complex tasks. Head CT in 2019 no acute changes. No clear parkinsonian signs on video. We discussed the diagnosis of dementia, likely Alzheimer's disease, with behavioral disturbance and associated hallucinations. We discussed expectations from medications used for hallucinations, and that if hallucinations are not significantly bothersome for patient, would weigh benefits and risks, especially at her age. Continue Risperdal 0.5mg  qhs for now, daughter is concerned about inability to sleep through the night, if no effect in another week, a prescription for Trazodone 50mg  1/2 tab qhs will be sent. Side effects discussed, we can further increase if necessary. Resources and educational information for dealing with behavioral changes in dementia will be sent to [facility]. Follow-up in 6 months, they know to call for any changes.  Follow Up Instructions:   -I discussed the assessment and treatment plan with the patient/family. The patient/family were provided an opportunity to ask questions and all were answered. The patient/family agreed with the plan and demonstrated an understanding of the instructions.   The patient was advised to call back or seek an in-person evaluation if the symptoms worsen or if the condition fails to improve as anticipated.   Cameron Sprang, MD

## 2018-11-05 ENCOUNTER — Telehealth: Payer: Self-pay

## 2018-11-05 MED ORDER — TRAZODONE HCL 50 MG PO TABS: ORAL_TABLET | ORAL | 5 refills | Status: AC

## 2018-11-05 NOTE — Telephone Encounter (Signed)
Pt daughter called informed that mirtazapine was dc and trazodone was ordered, pharmacy called as well and mirtazapine was DC

## 2018-11-10 ENCOUNTER — Telehealth: Payer: Self-pay | Admitting: Neurology

## 2018-11-10 NOTE — Telephone Encounter (Signed)
Called pt daughter back--stated having both leg swelling for about 3 days, no fever/pain. Start the Rx Trazadone on the 13th of May. Please advise

## 2018-11-10 NOTE — Telephone Encounter (Signed)
Pls have daughter stop the Trazodone. If symptoms worsen, pls call PCP. Thanks

## 2018-11-10 NOTE — Telephone Encounter (Signed)
Daughter called during lunch and a message was sent to the office regarding her mom's feet swelling after taking a new medication Trazodone. Her daughter called again needing to speak with the nurse. Please Call. Thanks

## 2018-11-10 NOTE — Telephone Encounter (Signed)
Notified pt daughter to stop Rx Trazodone with Dr. Delice Lesch instructions. Pt daughter understood without questions.

## 2018-11-11 NOTE — Telephone Encounter (Signed)
-----   Message from Wilder Glade, LPN sent at 2/41/9914  9:53 AM EDT ----- Following up to see if anyone told you that Mrs Seif daughter called the office after hour and stated that [place] was having swelling in legs up to her knees after taken trazadone that was started on 11/05/18 at bedtime, I called this morning spoke to [name] she stated that at this time her mom was still in the bed but they had her feet elevated and that they had stopped the trazadone. Per print out from after hours the nurse they spoke with told them to follow up with PCP in 24 hrs if swelling wasn't any better. I told Mrs Mclees's daughter [name] to please call us and keep [place] informed on how she is doing.

## 2018-11-12 DIAGNOSIS — S72452D Displaced supracondylar fracture without intracondylar extension of lower end of left femur, subsequent encounter for closed fracture with routine healing: Secondary | ICD-10-CM | POA: Diagnosis not present

## 2018-11-14 ENCOUNTER — Other Ambulatory Visit: Payer: Self-pay

## 2018-11-14 ENCOUNTER — Ambulatory Visit (INDEPENDENT_AMBULATORY_CARE_PROVIDER_SITE_OTHER): Payer: Medicare Other | Admitting: Family Medicine

## 2018-11-14 ENCOUNTER — Ambulatory Visit: Payer: Self-pay

## 2018-11-14 DIAGNOSIS — I951 Orthostatic hypotension: Secondary | ICD-10-CM | POA: Diagnosis not present

## 2018-11-14 DIAGNOSIS — R600 Localized edema: Secondary | ICD-10-CM | POA: Diagnosis not present

## 2018-11-14 NOTE — Telephone Encounter (Signed)
Please see message. °

## 2018-11-14 NOTE — Telephone Encounter (Signed)
Video visit with pt today.

## 2018-11-14 NOTE — Progress Notes (Signed)
Patient ID: Veronica Phillips, female   DOB: 1922-11-18, 85 y.o.   MRN: [id number]  This visit type was conducted due to national recommendations for restrictions regarding the COVID-19 pandemic in an effort to limit this patient's exposure and mitigate transmission in our community.   Virtual Visit via Video Note  I connected with Veronica Phillips on 11/14/18 at 10:45 AM EDT by a video enabled telemedicine application and verified that I am speaking with the correct person using two identifiers.  Location patient: home Location provider:work or home office Persons participating in the virtual visit: patient, provider  I discussed the limitations of evaluation and management by telemedicine and the availability of in person appointments. The patient expressed understanding and agreed to proceed.   HPI: Video enabled visit with her daughter to discuss some recent increased swelling in her feet and lower legs.  Patient's chronic problems include history of orthostatic hypotension, GERD, achalasia, osteoarthritis, chronic kidney disease, B12 deficiency.  She is followed by neurology and recently placed on trazodone 25 mg at night.  Her daughter states that after she started that she started having edema.  We explained that edema can be seen with trazodone but not frequently.  She denies any dyspnea.  They are not obtaining regular weights.  She had basic metabolic panel back in January that was stable.  Patient does consume lots of sodium.  Does not ambulate much.  Daughter has compression hose but she has not been wearing these.  They discontinued her trazodone 4 days ago.  Have not seen much improvement yet.  Only other medications are Midodrine, omeprazole, and Risperdal.  She has no history of heart failure.  No recent chest pains.  No complaints of cough or fever.   ROS: See pertinent positives and negatives per HPI.  Past Medical History:  Diagnosis Date  . Arthritis    "knees, shoulders"  (12/29/2015)  . Atrophic gastritis   . Cancer of left breast ([place]) 1989  . CKD (chronic kidney disease), stage III ([place])   . Diverticulosis of colon   . GERD (gastroesophageal reflux disease)   . Glaucoma   . Headache    "awful headaches when I was coming up in my teens"  . Hypertension   . Lactose intolerance   . Orthostatic hypotension   . Pernicious anemia   . Vitamin B 12 deficiency     Past Surgical History:  Procedure Laterality Date  . BALLOON DILATION N/A 06/28/2014   Procedure: BALLOON DILATION;  Surgeon: Ladene Artist, MD;  [facility];  Service: Endoscopy;  Laterality: N/A;  . BOTOX INJECTION N/A 07/09/2013   Procedure: BOTOX INJECTION;  Surgeon: Ladene Artist, MD;  [facility];  Service: Endoscopy;  Laterality: N/A;  . BOTOX INJECTION N/A 06/28/2014   Procedure: BOTOX INJECTION;  Surgeon: Ladene Artist, MD;  [facility];  Service: Endoscopy;  Laterality: N/A;  . CATARACT EXTRACTION W/ INTRAOCULAR LENS  IMPLANT, BILATERAL Bilateral 08-2015-10/2015  . ESOPHAGEAL MANOMETRY N/A 10/27/2012   Procedure: ESOPHAGEAL MANOMETRY (EM);  Surgeon: Ladene Artist, MD;  [facility];  Service: Endoscopy;  Laterality: N/A;  . ESOPHAGOGASTRODUODENOSCOPY N/A 07/09/2013   Procedure: ESOPHAGOGASTRODUODENOSCOPY (EGD);  Surgeon: Ladene Artist, MD;  Location: [facility];  Service: Endoscopy;  Laterality: N/A;  . ESOPHAGOGASTRODUODENOSCOPY N/A 10/07/2013   Procedure: ESOPHAGOGASTRODUODENOSCOPY (EGD);  Surgeon: Ladene Artist, MD;  Location: [facility];  Service: Endoscopy;  Laterality: N/A;  wtih botox  . ESOPHAGOGASTRODUODENOSCOPY (EGD) WITH PROPOFOL N/A  06/28/2014   Procedure: ESOPHAGOGASTRODUODENOSCOPY (EGD) WITH PROPOFOL;  Surgeon: Ladene Artist, MD;  [facility];  Service: Endoscopy;  Laterality: N/A;  . MASTECTOMY Left 1989  . ORIF FEMUR FRACTURE Left 06/16/2018   Procedure: OPEN REDUCTION INTERNAL FIXATION (ORIF) DISTAL FEMUR  FRACTURE;  Surgeon: Leandrew Koyanagi, MD;  Location: [facility];  Service: Orthopedics;  Laterality: Left;    Family History  Problem Relation Age of Onset  . Hypertension Mother        family hx  . Lung cancer Father        family hx  . Stroke Other        family hx  . Heart disease Sister        family hx    SOCIAL HX: Non-smoker   Current Outpatient Medications:  .  acetaminophen (TYLENOL) 500 MG tablet, Take 500 mg by mouth every 6 (six) hours as needed (for pain). , Disp: , Rfl:  .  Ascorbic Acid (VITAMIN C PO), Take 1 tablet by mouth at bedtime. , Disp: , Rfl:  .  BIOTIN PO, Take 1 tablet by mouth every evening., Disp: , Rfl:  .  Cyanocobalamin (VITAMIN B-12 PO), Take 1 tablet by mouth at bedtime. , Disp: , Rfl:  .  ferrous gluconate (FERGON) 325 MG tablet, Take 325 mg by mouth daily after supper. , Disp: , Rfl:  .  latanoprost (XALATAN) 0.005 % ophthalmic solution, Place 1 drop into both eyes at bedtime. , Disp: , Rfl: 0 .  midodrine (PROAMATINE) 5 MG tablet, Take 5 mg by mouth 3 (three) times daily with meals., Disp: , Rfl:  .  Multiple Vitamins-Minerals (ONE-A-DAY WOMENS 50 PLUS PO), Take 1 tablet by mouth daily after supper. , Disp: , Rfl:  .  multivitamin-lutein (OCUVITE-LUTEIN) CAPS capsule, Take 1 capsule by mouth daily., Disp: , Rfl:  .  omeprazole (PRILOSEC) 20 MG capsule, Take 1 capsule (20 mg total) by mouth daily., Disp: 90 capsule, Rfl: 3 .  potassium chloride (K-DUR) 10 MEQ tablet, Take 1 tablet (10 mEq total) by mouth daily., Disp: 90 tablet, Rfl: 1 .  risperiDONE (RISPERDAL) 0.25 MG tablet, TAKE 1 TABLET(0.25 MG) BY MOUTH AT BEDTIME (Patient taking differently: Take 0.5 mg by mouth at bedtime. ), Disp: 90 tablet, Rfl: 1 .  traZODone (DESYREL) 50 MG tablet, Take 1/2 tablet every night, Disp: 15 tablet, Rfl: 5 .  vitamin E 400 UNIT capsule, Take 400 Units by mouth at bedtime. , Disp: , Rfl:   EXAM:  VITALS per patient if applicable:  GENERAL: alert, oriented, appears  well and in no acute distress  HEENT: atraumatic, conjunttiva clear, no obvious abnormalities on inspection of external nose and ears  NECK: normal movements of the head and neck  LUNGS: on inspection no signs of respiratory distress, breathing rate appears normal, no obvious gross SOB, gasping or wheezing  CV: no obvious cyanosis  MS: moves all visible extremities without noticeable abnormality  PSYCH/NEURO: pleasant and cooperative, no obvious depression or anxiety, speech and thought processing grossly intact  ASSESSMENT AND PLAN:  Discussed the following assessment and plan:  Bilateral foot and lower leg edema.  This is a new symptom.  Family concerned this may be related to trazodone.  Even though trazodone does have association with possible edema seems like this would be a low likelihood given that she was only on 25 mg and symptoms have not improved over the past several days after she discontinued.  She does not  have any red flags such as dyspnea.  We recommended the following  -Elevate legs frequently -Start back compression garments which they have at home already -Continue to leave off trazodone for now -We will try to avoid diuretics especially in view of her age and prior history of orthostatic hypotension -Touch base with primary by next week if not improving.  May need office evaluation to further assess if edema progressing and not improving with the above   I discussed the assessment and treatment plan with the patient. The patient was provided an opportunity to ask questions and all were answered. The patient agreed with the plan and demonstrated an understanding of the instructions.   The patient was advised to call back or seek an in-person evaluation if the symptoms worsen or if the condition fails to improve as anticipated.   Carolann Littler, MD

## 2018-11-14 NOTE — Telephone Encounter (Signed)
Pt's. Daughter called to report swelling in bilateral feet since she started on new medication, Trazodone on 5/13.  Reported symptoms of feet swelling to Neurologist, that started the medication, and was advised to hold the medication as of 5/18.  Daughter stated that the swelling has not gone down, even after the medication was stopped.  Reported bilateral feet swelling is equal in size.  Denied redness, pain, or drainage.  Denied fever/ chills.  Denied shortness of breath or chest pain.  Reported has had swelling in feet previously, but it always improved on its own, however, it has not this time.  Transferred pt. To the office to be scheduled for virtual appt.  Daughter agreed with plan.    Reason for Disposition . [1] MILD swelling of both ankles (i.e., pedal edema) AND [2] new onset or worsening  Answer Assessment - Initial Assessment Questions 1. ONSET: "When did the swelling start?" (e.g., minutes, hours, days)     On 5/15 2. LOCATION: "What part of the leg is swollen?"  "Are both legs swollen or just one leg?"     Bilateral feet  3. SEVERITY: "How bad is the swelling?" (e.g., localized; mild, moderate, severe)  - Localized - small area of swelling localized to one leg  - MILD pedal edema - swelling limited to foot and ankle, pitting edema < 1/4 inch (6 mm) deep, rest and elevation eliminate most or all swelling  - MODERATE edema - swelling of lower leg to knee, pitting edema > 1/4 inch (6 mm) deep, rest and elevation only partially reduce swelling  - SEVERE edema - swelling extends above knee, facial or hand swelling present      Mild with pitting  4. REDNESS: "Does the swelling look red or infected?"     Denied  5. PAIN: "Is the swelling painful to touch?" If so, ask: "How painful is it?"   (Scale 1-10; mild, moderate or severe)     Denied  6. FEVER: "Do you have a fever?" If so, ask: "What is it, how was it measured, and when did it start?"      Denied 7. CAUSE: "What do you think is  causing the leg swelling?"     It started after taking Trazodone  8. MEDICAL HISTORY: "Do you have a history of heart failure, kidney disease, liver failure, or cancer?"     Denied CHF, kidney disease 9. RECURRENT SYMPTOM: "Have you had leg swelling before?" If so, ask: "When was the last time?" "What happened that time?"     Has had intermittent swelling in feet but never medicated for this.  10. OTHER SYMPTOMS: "Do you have any other symptoms?" (e.g., chest pain, difficulty breathing)       Denied SOB. Denied chest pain.  Denied redness or drainage of feet.   11. PREGNANCY: "Is there any chance you are pregnant?" "When was your last menstrual period?"      N/a  Protocols used: LEG SWELLING AND EDEMA-A-AH

## 2018-11-24 ENCOUNTER — Telehealth: Payer: Self-pay | Admitting: *Deleted

## 2018-11-24 NOTE — Telephone Encounter (Signed)
Copied from [place] [id number]. Topic: General - Other >> Nov 24, 2018 10:23 AM Rutherford Nail, NT wrote: Reason for CRM: Patient's daughter, [name], calling and is requesting a call bak from Cory's assistant. Would not go into detail about what it was regarding. Please advise.  CB#: [phone]

## 2018-11-25 NOTE — Telephone Encounter (Signed)
Spoke to Veronica Phillips at the [facility] building.  Was unable to take the call at the time.  She did report pt seen Dr. Elease Hashimoto and still continues to have swelling.  Would you like a virtual visit?

## 2018-11-25 NOTE — Telephone Encounter (Signed)
We can do a virtual visit

## 2018-11-25 NOTE — Telephone Encounter (Signed)
Left a message for Veronica Phillips on identified voicemail informing her to call and schedule a virtual appointment with [name].  No further action required.

## 2018-11-26 ENCOUNTER — Other Ambulatory Visit: Payer: Self-pay

## 2018-11-26 ENCOUNTER — Encounter: Payer: Self-pay | Admitting: Adult Health

## 2018-11-26 ENCOUNTER — Ambulatory Visit (INDEPENDENT_AMBULATORY_CARE_PROVIDER_SITE_OTHER): Payer: Medicare Other | Admitting: Adult Health

## 2018-11-26 DIAGNOSIS — R600 Localized edema: Secondary | ICD-10-CM

## 2018-11-26 NOTE — Progress Notes (Signed)
Virtual Visit via Video Note  I connected with Veronica Phillips on 11/26/18 at  2:00 PM EDT by a video enabled telemedicine application and verified that I am speaking with the correct person using two identifiers.  Location patient: home Location provider:work or home office Persons participating in the virtual visit: patient, provider, daughter  I discussed the limitations of evaluation and management by telemedicine and the availability of in person appointments. The patient expressed understanding and agreed to proceed.   HPI: 85 year old female who is being seen today via video visit with her daughter for follow-up for increased swelling in her legs and feet.  Was seen approximately 1 week ago by another provider in the office after the patient started having lower extremity edema.  This started soon after starting trazodone 25 mg at night.  She denied dyspnea, chest pain, or pain in her lower extremities.  Family discontinued the trazodone 4 days prior to the initial appointment but had not seen much improvement at that time.   She was advised to start back using compression socks, elevate legs, and continue to leave off trazodone for now.  Today her daughter, [name] reports that swelling has started to improve.  There is no swelling in the right leg and only trace swelling in the left ankle.  Patient is using compression socks but [name] is not sure how well that is working.  For the most part she has been keeping the patient's legs elevated when she is resting on the couch or in bed. They continue not to use trazodone at this time.   ROS: See pertinent positives and negatives per HPI.  Past Medical History:  Diagnosis Date  . Arthritis    "knees, shoulders" (12/29/2015)  . Atrophic gastritis   . Cancer of left breast ([place]) 1989  . CKD (chronic kidney disease), stage III ([place])   . Diverticulosis of colon   . GERD (gastroesophageal reflux disease)   . Glaucoma   . Headache    "awful  headaches when I was coming up in my teens"  . Hypertension   . Lactose intolerance   . Orthostatic hypotension   . Pernicious anemia   . Vitamin B 12 deficiency     Past Surgical History:  Procedure Laterality Date  . BALLOON DILATION N/A 06/28/2014   Procedure: BALLOON DILATION;  Surgeon: Ladene Artist, MD;  [facility];  Service: Endoscopy;  Laterality: N/A;  . BOTOX INJECTION N/A 07/09/2013   Procedure: BOTOX INJECTION;  Surgeon: Ladene Artist, MD;  [facility];  Service: Endoscopy;  Laterality: N/A;  . BOTOX INJECTION N/A 06/28/2014   Procedure: BOTOX INJECTION;  Surgeon: Ladene Artist, MD;  [facility];  Service: Endoscopy;  Laterality: N/A;  . CATARACT EXTRACTION W/ INTRAOCULAR LENS  IMPLANT, BILATERAL Bilateral 08-2015-10/2015  . ESOPHAGEAL MANOMETRY N/A 10/27/2012   Procedure: ESOPHAGEAL MANOMETRY (EM);  Surgeon: Ladene Artist, MD;  [facility];  Service: Endoscopy;  Laterality: N/A;  . ESOPHAGOGASTRODUODENOSCOPY N/A 07/09/2013   Procedure: ESOPHAGOGASTRODUODENOSCOPY (EGD);  Surgeon: Ladene Artist, MD;  Location: [facility];  Service: Endoscopy;  Laterality: N/A;  . ESOPHAGOGASTRODUODENOSCOPY N/A 10/07/2013   Procedure: ESOPHAGOGASTRODUODENOSCOPY (EGD);  Surgeon: Ladene Artist, MD;  Location: [facility];  Service: Endoscopy;  Laterality: N/A;  wtih botox  . ESOPHAGOGASTRODUODENOSCOPY (EGD) WITH PROPOFOL N/A 06/28/2014   Procedure: ESOPHAGOGASTRODUODENOSCOPY (EGD) WITH PROPOFOL;  Surgeon: Ladene Artist, MD;  [facility];  Service: Endoscopy;  Laterality: N/A;  . MASTECTOMY Left  1989  . ORIF FEMUR FRACTURE Left 06/16/2018   Procedure: OPEN REDUCTION INTERNAL FIXATION (ORIF) DISTAL FEMUR FRACTURE;  Surgeon: Leandrew Koyanagi, MD;  Location: [facility];  Service: Orthopedics;  Laterality: Left;    Family History  Problem Relation Age of Onset  . Hypertension Mother        family hx  . Lung cancer Father        family hx  .  Stroke Other        family hx  . Heart disease Sister        family hx     Current Outpatient Medications:  .  acetaminophen (TYLENOL) 500 MG tablet, Take 500 mg by mouth every 6 (six) hours as needed (for pain). , Disp: , Rfl:  .  Ascorbic Acid (VITAMIN C PO), Take 1 tablet by mouth at bedtime. , Disp: , Rfl:  .  BIOTIN PO, Take 1 tablet by mouth every evening., Disp: , Rfl:  .  Cyanocobalamin (VITAMIN B-12 PO), Take 1 tablet by mouth at bedtime. , Disp: , Rfl:  .  ferrous gluconate (FERGON) 325 MG tablet, Take 325 mg by mouth daily after supper. , Disp: , Rfl:  .  latanoprost (XALATAN) 0.005 % ophthalmic solution, Place 1 drop into both eyes at bedtime. , Disp: , Rfl: 0 .  midodrine (PROAMATINE) 5 MG tablet, Take 5 mg by mouth 3 (three) times daily with meals., Disp: , Rfl:  .  Multiple Vitamins-Minerals (ONE-A-DAY WOMENS 50 PLUS PO), Take 1 tablet by mouth daily after supper. , Disp: , Rfl:  .  multivitamin-lutein (OCUVITE-LUTEIN) CAPS capsule, Take 1 capsule by mouth daily., Disp: , Rfl:  .  omeprazole (PRILOSEC) 20 MG capsule, Take 1 capsule (20 mg total) by mouth daily., Disp: 90 capsule, Rfl: 3 .  potassium chloride (K-DUR) 10 MEQ tablet, Take 1 tablet (10 mEq total) by mouth daily., Disp: 90 tablet, Rfl: 1 .  risperiDONE (RISPERDAL) 0.25 MG tablet, TAKE 1 TABLET(0.25 MG) BY MOUTH AT BEDTIME (Patient taking differently: Take 0.5 mg by mouth at bedtime. ), Disp: 90 tablet, Rfl: 1 .  traZODone (DESYREL) 50 MG tablet, Take 1/2 tablet every night, Disp: 15 tablet, Rfl: 5 .  vitamin E 400 UNIT capsule, Take 400 Units by mouth at bedtime. , Disp: , Rfl:   EXAM:  VITALS per patient if applicable:  GENERAL: alert, oriented, appears well and in no acute distress  HEENT: atraumatic, conjunttiva clear, no obvious abnormalities on inspection of external nose and ears  NECK: normal movements of the head and neck  LUNGS: on inspection no signs of respiratory distress, breathing rate appears  normal, no obvious gross SOB, gasping or wheezing  CV: no obvious cyanosis  MS: moves all visible extremities without noticeable abnormality  PSYCH/NEURO: pleasant and cooperative, no obvious depression or anxiety, speech and thought processing grossly intact  ASSESSMENT AND PLAN:  Discussed the following assessment and plan:  Bilateral lower extremity edema -She is on a low-dose of trazodone, likely not causative factor and family was notified of this.  Edema seems to be resolving.  Advised to continue with compression socks, elevate legs, and watch sodium intake.  Thankfully we do not need to use diuretics at this time.  Advise follow-up with swelling returns.    I discussed the assessment and treatment plan with the patient. The patient was provided an opportunity to ask questions and all were answered. The patient agreed with the plan and demonstrated an understanding of the  instructions.   The patient was advised to call back or seek an in-person evaluation if the symptoms worsen or if the condition fails to improve as anticipated.   Dorothyann Peng, NP

## 2018-12-01 ENCOUNTER — Other Ambulatory Visit: Payer: Self-pay | Admitting: Adult Health

## 2018-12-01 DIAGNOSIS — R4189 Other symptoms and signs involving cognitive functions and awareness: Secondary | ICD-10-CM

## 2018-12-01 NOTE — Telephone Encounter (Signed)
Copied from [facility] [id number]. Topic: Quick Communication - Rx Refill/Question >> Dec 01, 2018 10:13 AM Erick Blinks wrote: Medication: risperiDONE (RISPERDAL) 0.25 MG tablet [[id number]] -pt is out   Has the patient contacted their pharmacy? Yes  (Agent: If no, request that the patient contact the pharmacy for the refill.) (Agent: If yes, when and what did the pharmacy advise?)  Preferred Pharmacy (with phone number or street name): [facility] [phone] - [place], [place] - [address] [place] [address] Phone: [phone] Fax: [phone]    Agent: Please be advised that RX refills may take up to 3 business days. We ask that you follow-up with your pharmacy.

## 2018-12-02 MED ORDER — RISPERIDONE 0.25 MG PO TABS: 0.5000 mg | ORAL_TABLET | Freq: Every day | ORAL | 1 refills | Status: DC

## 2018-12-09 ENCOUNTER — Other Ambulatory Visit: Payer: Self-pay

## 2018-12-09 ENCOUNTER — Encounter: Payer: Self-pay | Admitting: Podiatry

## 2018-12-09 ENCOUNTER — Ambulatory Visit (INDEPENDENT_AMBULATORY_CARE_PROVIDER_SITE_OTHER): Payer: Medicare Other | Admitting: Podiatry

## 2018-12-09 VITALS — BP 166/102 | HR 82 | Temp 96.8°F

## 2018-12-09 DIAGNOSIS — B351 Tinea unguium: Secondary | ICD-10-CM

## 2018-12-09 DIAGNOSIS — M79675 Pain in left toe(s): Secondary | ICD-10-CM | POA: Diagnosis not present

## 2018-12-09 DIAGNOSIS — M79674 Pain in right toe(s): Secondary | ICD-10-CM

## 2018-12-09 NOTE — Patient Instructions (Signed)

## 2018-12-16 ENCOUNTER — Encounter: Payer: Self-pay | Admitting: Orthopaedic Surgery

## 2018-12-16 ENCOUNTER — Other Ambulatory Visit: Payer: Self-pay

## 2018-12-16 ENCOUNTER — Ambulatory Visit (INDEPENDENT_AMBULATORY_CARE_PROVIDER_SITE_OTHER): Payer: Medicare Other | Admitting: Orthopaedic Surgery

## 2018-12-16 ENCOUNTER — Ambulatory Visit: Payer: Self-pay

## 2018-12-16 DIAGNOSIS — S72452D Displaced supracondylar fracture without intracondylar extension of lower end of left femur, subsequent encounter for closed fracture with routine healing: Secondary | ICD-10-CM

## 2018-12-16 NOTE — Progress Notes (Signed)
Office Visit Note   Patient: Veronica Phillips           Date of Birth: 07/09/1922           MRN: [id number] Visit Date: 12/16/2018              Requested by: Dorothyann Peng, NP [address] [place],  [place] [address] PCP: Dorothyann Peng, NP   Assessment & Plan: Visit Diagnoses:  1. Closed supracondylar fracture of left femur with routine healing, subsequent encounter     Plan: Impression is healed left supracondylar femur fracture.  Patient continues to do well.  She will continue to work on a home exercise program.  She will follow-up with [place] as needed.  Follow-Up Instructions: Return if symptoms worsen or fail to improve.   Orders:  Orders Placed This Encounter  Procedures  . XR FEMUR MIN 2 VIEWS LEFT   No orders of the defined types were placed in this encounter.     Procedures: No procedures performed   Clinical Data: No additional findings.   Subjective: Chief Complaint  Patient presents with  . Left Leg - Follow-up    HPI patient is a pleasant 85 year old female presents our clinic today 6 months status post ORIF left distal femur fracture, date of surgery 06/16/2018.  She comes in today in a wheelchair and with her daughter.  She has been doing well.  Very slight tenderness to the area, but really no pain.  She was fairly nonambulatory prior to the injury and is now able to get up on occasion to do different things throughout the house.  Her blood pressure has stabilized enough for her to do so.  Overall, doing very well.  Review of Systems as detailed in HPI.  All others reviewed and are negative.   Objective: Vital Signs: There were no vitals taken for this visit.  Physical Exam well-developed and well-nourished female in no acute distress.  Alert and oriented x3.  Ortho Exam examination of her left hip reveals fairly good range of motion and strength.  Calf soft nontender.  She is neurovascularly intact distally.  Specialty Comments:  No  specialty comments available.  Imaging: Xr Femur Min 2 Views Left  Result Date: 12/16/2018 X-rays demonstrate a healed supracondylar femur fracture    PMFS History: Patient Active Problem List   Diagnosis Date Noted  . Pressure injury of skin 06/16/2018  . Closed supracondylar fracture of left femur ([place]) April 02, 202019  . Fall at home, initial encounter April 02, 202019  . Recurrent UTI April 02, 202019  . Acute metabolic encephalopathy 81/06/7508  . Orthostatic hypotension 03/11/2018  . Acute encephalopathy 03/11/2018  . Bilateral primary osteoarthritis of knee 02/27/2018  . Weight loss 08/30/2017  . S/p left hip fracture 07/12/2017  . Subcoracoid bursitis, right 07/12/2017  . CKD (chronic kidney disease), stage III ([place]) 12/29/2015  . Dizziness 12/29/2015  . Lower urinary tract infectious disease 12/29/2015  . Orthostatic syncope 12/29/2015  . Fall   . Chronic anemia   . Need for prophylactic vaccination against Streptococcus pneumoniae (pneumococcus) 01/06/2015  . Precordial chest pain 11/05/2014  . Dysphagia, pharyngoesophageal phase   . 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 Achalasia 07/07/2013  . Peripheral edema 05/19/2012  . Acute cystitis without hematuria 02/08/2011  . Glaucoma 07/17/2010  . GERD 07/17/2010  . OTHER DYSPHAGIA 07/17/2010  . PROBLEMS WITH SWALLOWING AND MASTICATION 07/11/2010  . IDIOPATHIC URTICARIA 04/11/2010  . ANEMIA, B12 DEFICIENCY 05/18/2008  . OSTEOARTHRITIS 05/12/2007  .  DIVERTICULOSIS, COLON 02/26/2007   Past Medical History:  Diagnosis Date  . Arthritis    "knees, shoulders" (12/29/2015)  . Atrophic gastritis   . Cancer of left breast ([place]) 1989  . CKD (chronic kidney disease), stage III ([place])   . Diverticulosis of colon   . GERD (gastroesophageal reflux disease)   . Glaucoma   . Headache    "awful headaches when I was coming up in my teens"  . Hypertension   . Lactose intolerance   . Orthostatic hypotension   . Pernicious anemia   .  Vitamin B 12 deficiency     Family History  Problem Relation Age of Onset  . Hypertension Mother        family hx  . Lung cancer Father        family hx  . Stroke Other        family hx  . Heart disease Sister        family hx    Past Surgical History:  Procedure Laterality Date  . BALLOON DILATION N/A 06/28/2014   Procedure: BALLOON DILATION;  Surgeon: Ladene Artist, MD;  [facility];  Service: Endoscopy;  Laterality: N/A;  . BOTOX INJECTION N/A 07/09/2013   Procedure: BOTOX INJECTION;  Surgeon: Ladene Artist, MD;  [facility];  Service: Endoscopy;  Laterality: N/A;  . BOTOX INJECTION N/A 06/28/2014   Procedure: BOTOX INJECTION;  Surgeon: Ladene Artist, MD;  [facility];  Service: Endoscopy;  Laterality: N/A;  . CATARACT EXTRACTION W/ INTRAOCULAR LENS  IMPLANT, BILATERAL Bilateral 08-2015-10/2015  . ESOPHAGEAL MANOMETRY N/A 10/27/2012   Procedure: ESOPHAGEAL MANOMETRY (EM);  Surgeon: Ladene Artist, MD;  [facility];  Service: Endoscopy;  Laterality: N/A;  . ESOPHAGOGASTRODUODENOSCOPY N/A 07/09/2013   Procedure: ESOPHAGOGASTRODUODENOSCOPY (EGD);  Surgeon: Ladene Artist, MD;  Location: [facility];  Service: Endoscopy;  Laterality: N/A;  . ESOPHAGOGASTRODUODENOSCOPY N/A 10/07/2013   Procedure: ESOPHAGOGASTRODUODENOSCOPY (EGD);  Surgeon: Ladene Artist, MD;  Location: [facility];  Service: Endoscopy;  Laterality: N/A;  wtih botox  . ESOPHAGOGASTRODUODENOSCOPY (EGD) WITH PROPOFOL N/A 06/28/2014   Procedure: ESOPHAGOGASTRODUODENOSCOPY (EGD) WITH PROPOFOL;  Surgeon: Ladene Artist, MD;  [facility];  Service: Endoscopy;  Laterality: N/A;  . MASTECTOMY Left 1989  . ORIF FEMUR FRACTURE Left 06/16/2018   Procedure: OPEN REDUCTION INTERNAL FIXATION (ORIF) DISTAL FEMUR FRACTURE;  Surgeon: Leandrew Koyanagi, MD;  Location: [facility];  Service: Orthopedics;  Laterality: Left;   Social History   Occupational History  . Not on file  Tobacco Use   . Smoking status: Never Smoker  . Smokeless tobacco: Never Used  Substance and Sexual Activity  . Alcohol use: No  . Drug use: No  . Sexual activity: Not Currently

## 2018-12-17 ENCOUNTER — Telehealth: Payer: Self-pay | Admitting: Adult Health

## 2018-12-17 NOTE — Telephone Encounter (Signed)
Patient's daughter [name] is requesting an In-office visit for the continuous swelling of the patient's ankles and feet. She states pt has been seen twice for this condition so she wants to bring her in.

## 2018-12-17 NOTE — Telephone Encounter (Signed)
Ok for in-office visit. 

## 2018-12-17 NOTE — Telephone Encounter (Signed)
Pt now scheduled to see [facility] on 12/18/2018.  Nothing further needed.

## 2018-12-18 ENCOUNTER — Ambulatory Visit: Payer: Medicare Other | Admitting: Adult Health

## 2018-12-19 ENCOUNTER — Other Ambulatory Visit: Payer: Self-pay

## 2018-12-19 ENCOUNTER — Encounter: Payer: Self-pay | Admitting: Adult Health

## 2018-12-19 ENCOUNTER — Ambulatory Visit (INDEPENDENT_AMBULATORY_CARE_PROVIDER_SITE_OTHER): Payer: Medicare Other | Admitting: Adult Health

## 2018-12-19 ENCOUNTER — Other Ambulatory Visit: Payer: Self-pay | Admitting: Adult Health

## 2018-12-19 DIAGNOSIS — R600 Localized edema: Secondary | ICD-10-CM

## 2018-12-19 MED ORDER — HYDROCHLOROTHIAZIDE 12.5 MG PO CAPS: ORAL_CAPSULE | ORAL | 0 refills | Status: AC

## 2018-12-19 NOTE — Progress Notes (Signed)
Virtual Visit via Video Note  I connected with Veronica Phillips on 12/19/18 at  3:00 PM EDT by a video enabled telemedicine application and verified that I am speaking with the correct person using two identifiers.  Location patient: home Location provider:work or home office Persons participating in the virtual visit: patient, provider, daughter  I discussed the limitations of evaluation and management by telemedicine and the availability of in person appointments. The patient expressed understanding and agreed to proceed.   HPI:  85 year old female who is being evaluated today for lower extremity edema.  She has had bilateral lower extremity edema for approximately 1 month at this point in time, but edema has been progressively getting worse.  Her daughter has been elevating her legs while resting and using compression socks but this has not helped at all.  Patient has not been experiencing any shortness of breath or chest pain.  There is no redness or warmth or calf tenderness noted.  Blood pressures have been running between 90s and 120s over 60s, her daughter reports that the blood pressures have been more in the 120s without Midodrin lately.  ROS: See pertinent positives and negatives per HPI.  Past Medical History:  Diagnosis Date  . Arthritis    "knees, shoulders" (12/29/2015)  . Atrophic gastritis   . Cancer of left breast ([place]) 1989  . CKD (chronic kidney disease), stage III ([place])   . Diverticulosis of colon   . GERD (gastroesophageal reflux disease)   . Glaucoma   . Headache    "awful headaches when I was coming up in my teens"  . Hypertension   . Lactose intolerance   . Orthostatic hypotension   . Pernicious anemia   . Vitamin B 12 deficiency     Past Surgical History:  Procedure Laterality Date  . BALLOON DILATION N/A 06/28/2014   Procedure: BALLOON DILATION;  Surgeon: Ladene Artist, MD;  [facility];  Service: Endoscopy;  Laterality: N/A;  . BOTOX INJECTION  N/A 07/09/2013   Procedure: BOTOX INJECTION;  Surgeon: Ladene Artist, MD;  [facility];  Service: Endoscopy;  Laterality: N/A;  . BOTOX INJECTION N/A 06/28/2014   Procedure: BOTOX INJECTION;  Surgeon: Ladene Artist, MD;  [facility];  Service: Endoscopy;  Laterality: N/A;  . CATARACT EXTRACTION W/ INTRAOCULAR LENS  IMPLANT, BILATERAL Bilateral 08-2015-10/2015  . ESOPHAGEAL MANOMETRY N/A 10/27/2012   Procedure: ESOPHAGEAL MANOMETRY (EM);  Surgeon: Ladene Artist, MD;  [facility];  Service: Endoscopy;  Laterality: N/A;  . ESOPHAGOGASTRODUODENOSCOPY N/A 07/09/2013   Procedure: ESOPHAGOGASTRODUODENOSCOPY (EGD);  Surgeon: Ladene Artist, MD;  Location: [facility];  Service: Endoscopy;  Laterality: N/A;  . ESOPHAGOGASTRODUODENOSCOPY N/A 10/07/2013   Procedure: ESOPHAGOGASTRODUODENOSCOPY (EGD);  Surgeon: Ladene Artist, MD;  Location: [facility];  Service: Endoscopy;  Laterality: N/A;  wtih botox  . ESOPHAGOGASTRODUODENOSCOPY (EGD) WITH PROPOFOL N/A 06/28/2014   Procedure: ESOPHAGOGASTRODUODENOSCOPY (EGD) WITH PROPOFOL;  Surgeon: Ladene Artist, MD;  [facility];  Service: Endoscopy;  Laterality: N/A;  . MASTECTOMY Left 1989  . ORIF FEMUR FRACTURE Left 06/16/2018   Procedure: OPEN REDUCTION INTERNAL FIXATION (ORIF) DISTAL FEMUR FRACTURE;  Surgeon: Leandrew Koyanagi, MD;  Location: [facility];  Service: Orthopedics;  Laterality: Left;    Family History  Problem Relation Age of Onset  . Hypertension Mother        family hx  . Lung cancer Father        family hx  . Stroke Other  family hx  . Heart disease Sister        family hx     Current Outpatient Medications:  .  acetaminophen (TYLENOL) 500 MG tablet, Take 500 mg by mouth every 6 (six) hours as needed (for pain). , Disp: , Rfl:  .  Ascorbic Acid (VITAMIN C PO), Take 1 tablet by mouth at bedtime. , Disp: , Rfl:  .  BIOTIN PO, Take 1 tablet by mouth every evening., Disp: , Rfl:  .  Cyanocobalamin  (VITAMIN B-12 PO), Take 1 tablet by mouth at bedtime. , Disp: , Rfl:  .  ferrous gluconate (FERGON) 325 MG tablet, Take 325 mg by mouth daily after supper. , Disp: , Rfl:  .  latanoprost (XALATAN) 0.005 % ophthalmic solution, Place 1 drop into both eyes at bedtime. , Disp: , Rfl: 0 .  midodrine (PROAMATINE) 5 MG tablet, Take 5 mg by mouth 3 (three) times daily with meals., Disp: , Rfl:  .  Multiple Vitamins-Minerals (ONE-A-DAY WOMENS 50 PLUS PO), Take 1 tablet by mouth daily after supper. , Disp: , Rfl:  .  multivitamin-lutein (OCUVITE-LUTEIN) CAPS capsule, Take 1 capsule by mouth daily., Disp: , Rfl:  .  omeprazole (PRILOSEC) 20 MG capsule, Take 1 capsule (20 mg total) by mouth daily., Disp: 90 capsule, Rfl: 3 .  potassium chloride (K-DUR) 10 MEQ tablet, Take 1 tablet (10 mEq total) by mouth daily., Disp: 90 tablet, Rfl: 1 .  risperiDONE (RISPERDAL) 0.25 MG tablet, Take 2 tablets (0.5 mg total) by mouth at bedtime., Disp: 180 tablet, Rfl: 1 .  traZODone (DESYREL) 50 MG tablet, Take 1/2 tablet every night, Disp: 15 tablet, Rfl: 5 .  vitamin E 400 UNIT capsule, Take 400 Units by mouth at bedtime. , Disp: , Rfl:   EXAM:  VITALS per patient if applicable:  GENERAL: alert, oriented, appears well and in no acute distress  HEENT: atraumatic, conjunttiva clear, no obvious abnormalities on inspection of external nose and ears  NECK: normal movements of the head and neck  LUNGS: on inspection no signs of respiratory distress, breathing rate appears normal, no obvious gross SOB, gasping or wheezing  CV: no obvious cyanosis. + 2 pitting edema bilaterally. No redness or streaking noted.   MS: moves all visible extremities without noticeable abnormality  PSYCH/NEURO: pleasant and cooperative, no obvious depression or anxiety, speech and thought processing grossly intact  ASSESSMENT AND PLAN:  Discussed the following assessment and plan:  1. Lower extremity edema -Significantly swollen lower  extremities.  She has no chest pain or shortness of breath doubt PE or DVT.  Does have issues with orthostatic hypotension so we were trying to forego diuretic therapy.  We will have her start off with taking 6.25 mg of hydrochlorothiazide every other day and will have the daughter give 10 mg of midodrine with this medication.   - We will follow up in two days or sooner if needed - hydrochlorothiazide (MICROZIDE) 12.5 MG capsule; Take 1/2 tab every other day  Dispense: 15 capsule; Refill: 0     I discussed the assessment and treatment plan with the patient. The patient was provided an opportunity to ask questions and all were answered. The patient agreed with the plan and demonstrated an understanding of the instructions.   The patient was advised to call back or seek an in-person evaluation if the symptoms worsen or if the condition fails to improve as anticipated.   Dorothyann Peng, NP

## 2018-12-22 NOTE — Progress Notes (Signed)
Subjective: Veronica Phillips presents today referred by Veronica Peng, NP with cc of painful, discolored, thick toenails for several weeks in duration. Location is digits 1-5 b/l. Pain is getting progressively worse. Aggravated when wearing enclosed shoe gear.   Veronica Phillips has seen Dr. Amalia Phillips in the past. Last visit was June, 2016.  Past Medical History:  Diagnosis Date  . Arthritis    "knees, shoulders" (12/29/2015)  . Atrophic gastritis   . Cancer of left breast ([place]) 1989  . CKD (chronic kidney disease), stage III ([place])   . Diverticulosis of colon   . GERD (gastroesophageal reflux disease)   . Glaucoma   . Headache    "awful headaches when I was coming up in my teens"  . Hypertension   . Lactose intolerance   . Orthostatic hypotension   . Pernicious anemia   . Vitamin B 12 deficiency      Patient Active Problem List   Diagnosis Date Noted  . Pressure injury of skin 06/16/2018  . Closed supracondylar fracture of left femur ([place]) 30-Dec-202019  . Fall at home, initial encounter 30-Dec-202019  . Recurrent UTI 30-Dec-202019  . Acute metabolic encephalopathy 12/45/8099  . Orthostatic hypotension 03/11/2018  . Acute encephalopathy 03/11/2018  . Bilateral primary osteoarthritis of knee 02/27/2018  . Weight loss 08/30/2017  . S/p left hip fracture 07/12/2017  . Subcoracoid bursitis, right 07/12/2017  . CKD (chronic kidney disease), stage III ([place]) 12/29/2015  . Dizziness 12/29/2015  . Lower urinary tract infectious disease 12/29/2015  . Orthostatic syncope 12/29/2015  . Fall   . Chronic anemia   . Need for prophylactic vaccination against Streptococcus pneumoniae (pneumococcus) 01/06/2015  . Precordial chest pain 11/05/2014  . Dysphagia, pharyngoesophageal phase   . 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 Achalasia 07/07/2013  . Peripheral edema 05/19/2012  . Acute cystitis without hematuria 02/08/2011  . Glaucoma 07/17/2010  . GERD 07/17/2010  . OTHER DYSPHAGIA 07/17/2010  .  PROBLEMS WITH SWALLOWING AND MASTICATION 07/11/2010  . IDIOPATHIC URTICARIA 04/11/2010  . ANEMIA, B12 DEFICIENCY 05/18/2008  . OSTEOARTHRITIS 05/12/2007  . DIVERTICULOSIS, COLON 02/26/2007     Past Surgical History:  Procedure Laterality Date  . BALLOON DILATION N/A 06/28/2014   Procedure: BALLOON DILATION;  Surgeon: Veronica Artist, MD;  Location: [facility];  Service: Endoscopy;  Laterality: N/A;  . BOTOX INJECTION N/A 07/09/2013   Procedure: BOTOX INJECTION;  Surgeon: Veronica Artist, MD;  [facility];  Service: Endoscopy;  Laterality: N/A;  . BOTOX INJECTION N/A 06/28/2014   Procedure: BOTOX INJECTION;  Surgeon: Veronica Artist, MD;  [facility];  Service: Endoscopy;  Laterality: N/A;  . CATARACT EXTRACTION W/ INTRAOCULAR LENS  IMPLANT, BILATERAL Bilateral 08-2015-10/2015  . ESOPHAGEAL MANOMETRY N/A 10/27/2012   Procedure: ESOPHAGEAL MANOMETRY (EM);  Surgeon: Veronica Artist, MD;  [facility];  Service: Endoscopy;  Laterality: N/A;  . ESOPHAGOGASTRODUODENOSCOPY N/A 07/09/2013   Procedure: ESOPHAGOGASTRODUODENOSCOPY (EGD);  Surgeon: Veronica Artist, MD;  Location: [facility];  Service: Endoscopy;  Laterality: N/A;  . ESOPHAGOGASTRODUODENOSCOPY N/A 10/07/2013   Procedure: ESOPHAGOGASTRODUODENOSCOPY (EGD);  Surgeon: Veronica Artist, MD;  Location: [facility];  Service: Endoscopy;  Laterality: N/A;  wtih botox  . ESOPHAGOGASTRODUODENOSCOPY (EGD) WITH PROPOFOL N/A 06/28/2014   Procedure: ESOPHAGOGASTRODUODENOSCOPY (EGD) WITH PROPOFOL;  Surgeon: Veronica Artist, MD;  [facility];  Service: Endoscopy;  Laterality: N/A;  . MASTECTOMY Left 1989  . ORIF FEMUR FRACTURE Left 06/16/2018   Procedure: OPEN REDUCTION INTERNAL FIXATION (ORIF)  DISTAL FEMUR FRACTURE;  Surgeon: Veronica Koyanagi, MD;  Location: [facility];  Service: Orthopedics;  Laterality: Left;      Current Outpatient Medications:  .  acetaminophen (TYLENOL) 500 MG tablet, Take 500 mg by mouth every 6  (six) hours as needed (for pain). , Disp: , Rfl:  .  Ascorbic Acid (VITAMIN C PO), Take 1 tablet by mouth at bedtime. , Disp: , Rfl:  .  BIOTIN PO, Take 1 tablet by mouth every evening., Disp: , Rfl:  .  Cyanocobalamin (VITAMIN B-12 PO), Take 1 tablet by mouth at bedtime. , Disp: , Rfl:  .  ferrous gluconate (FERGON) 325 MG tablet, Take 325 mg by mouth daily after supper. , Disp: , Rfl:  .  hydrochlorothiazide (MICROZIDE) 12.5 MG capsule, Take 1/2 tab every other day, Disp: 15 capsule, Rfl: 0 .  latanoprost (XALATAN) 0.005 % ophthalmic solution, Place 1 drop into both eyes at bedtime. , Disp: , Rfl: 0 .  midodrine (PROAMATINE) 5 MG tablet, Take 5 mg by mouth 3 (three) times daily with meals., Disp: , Rfl:  .  Multiple Vitamins-Minerals (ONE-A-DAY WOMENS 50 PLUS PO), Take 1 tablet by mouth daily after supper. , Disp: , Rfl:  .  multivitamin-lutein (OCUVITE-LUTEIN) CAPS capsule, Take 1 capsule by mouth daily., Disp: , Rfl:  .  omeprazole (PRILOSEC) 20 MG capsule, Take 1 capsule (20 mg total) by mouth daily., Disp: 90 capsule, Rfl: 3 .  potassium chloride (K-DUR) 10 MEQ tablet, Take 1 tablet (10 mEq total) by mouth daily., Disp: 90 tablet, Rfl: 1 .  risperiDONE (RISPERDAL) 0.25 MG tablet, Take 2 tablets (0.5 mg total) by mouth at bedtime., Disp: 180 tablet, Rfl: 1 .  traZODone (DESYREL) 50 MG tablet, Take 1/2 tablet every night, Disp: 15 tablet, Rfl: 5 .  vitamin E 400 UNIT capsule, Take 400 Units by mouth at bedtime. , Disp: , Rfl:    Allergies  Allergen Reactions  . Septra [Bactrim] Swelling    Site of swelling not recalled by the patient  . Lactose Intolerance (Gi) Diarrhea  . Sulfa Antibiotics Swelling    Site of swelling not recalled by the patient  . Tape Other (See Comments)    Skin is sensitive; please use paper tape!!     Social History   Occupational History  . Not on file  Tobacco Use  . Smoking status: Never Smoker  . Smokeless tobacco: Never Used  Substance and Sexual  Activity  . Alcohol use: No  . Drug use: No  . Sexual activity: Not Currently     Family History  Problem Relation Age of Onset  . Hypertension Mother        family hx  . Lung cancer Father        family hx  . Stroke Other        family hx  . Heart disease Sister        family hx     Immunization History  Administered Date(s) Administered  . Influenza Split 03/12/2011, 03/18/2012  . Influenza Whole 06/25/2005, 05/12/2007, 03/18/2009, 03/14/2010  . Influenza, High Dose Seasonal PF 03/27/2016, 03/21/2017, 03/13/2018  . Influenza,inj,Quad PF,6+ Mos 03/12/2013, 03/04/2014  . Pneumococcal Conjugate-13 01/06/2015  . Pneumococcal Polysaccharide-23 06/26/2003  . Td 06/25/1997, 05/11/2008     Review of systems: Positive Findings in bold print.  Constitutional:  chills, fatigue, fever, sweats, weight change Communication: Optometrist, sign Ecologist, hand writing, iPad/Android device Head: headaches, head injury Eyes: changes in vision, eye  pain, glaucoma, cataracts, macular degeneration, diplopia, glare,  light sensitivity, eyeglasses or contacts, blindness Ears nose mouth throat: hearing impaired, hearing aids,  ringing in ears, deaf, sign language,  vertigo,   nosebleeds,  rhinitis,  cold sores, snoring, swollen glands Cardiovascular: HTN, edema, arrhythmia, pacemaker in place, defibrillator in place, chest pain/tightness, chronic anticoagulation, blood clot, heart failure, MI Peripheral Vascular: leg cramps, varicose veins, blood clots, lymphedema, varicosities Respiratory:  difficulty breathing, denies congestion, SOB, wheezing, cough, emphysema Gastrointestinal: change in appetite or weight, abdominal pain, constipation, diarrhea, nausea, vomiting, vomiting blood, change in bowel habits, abdominal pain, jaundice, rectal bleeding, hemorrhoids, GERD Genitourinary:  nocturia,  pain on urination, polyuria,  blood in urine, Foley catheter, urinary urgency, ESRD on  hemodialysis Musculoskeletal: amputation, cramping, stiff joints, painful joints, decreased joint motion, fractures, OA, gout, hemiplegia, paraplegia, uses cane, wheelchair bound, uses walker, uses rollator Skin: +changes in toenails, color change, dryness, itching, mole changes,  rash, wound(s) Neurological: headaches, numbness in feet, paresthesias in feet, burning in feet, fainting,  seizures, change in speech. denies headaches, memory problems/poor historian, cerebral palsy, weakness, paralysis, CVA, TIA Endocrine: diabetes, hypothyroidism, hyperthyroidism,  goiter, dry mouth, flushing, heat intolerance,  cold intolerance,  excessive thirst, denies polyuria,  nocturia Hematological:  easy bleeding, excessive bleeding, easy bruising, enlarged lymph nodes, on long term blood thinner, history of past transusions Allergy/immunological:  hives, eczema, frequent infections, multiple drug allergies, seasonal allergies, transplant recipient, multiple food allergies Psychiatric:  anxiety, depression, mood disorder, suicidal ideations, hallucinations, insomnia  Objective: Vitals:   12/09/18 1414  BP: (!) 166/102  Pulse: 82  Temp: (!) 96.8 F (36 C)    Vascular Examination: Capillary refill time immediate x 10 digits.  Dorsalis pedis pulses 2/4 b/l.   Posterior tibial pulses nonpalpable b/l.   No digital hair x 10 digits.  Skin temperature gradient WNL b/l  +2 pitting edema BLE.  No pain on calf compression.  Dermatological Examination: Skin is thin, shiny and atrophic b/l.  Toenails 1-5 b/l discolored, thick, dystrophic with subungual debris and pain with palpation to nailbeds due to thickness of nails.  Musculoskeletal: Muscle strength 5/5 to all LE muscle groups  Neurological: Sensation intact with 10 gram monofilament.  Vibratory sensation intact.  Assessment: 1. Painful onychomycosis toenails 1-5 b/l   Plan: 1. Discussed onychomycosis and treatment options.  Literature  dispensed on today. 2. Toenails 1-5 b/l were debrided in length and girth without iatrogenic bleeding. 3. Patient to continue soft, supportive shoe gear daily. 4. Patient to report any pedal injuries to medical professional immediately. 5. Follow up 3 months.  6. Patient/POA to call should there be a concern in the interim.

## 2018-12-24 ENCOUNTER — Telehealth: Payer: Self-pay | Admitting: Adult Health

## 2018-12-24 NOTE — Telephone Encounter (Signed)
Spoke to patient's daughter for follow-up regarding bilateral lower extremity edema.  Daughter reports that she has been giving HCTZ 6.255 mg on a daily basis has noticed significant improvement and lower extremity edema.  Blood pressures are maintaining.  We will have her continue with this dose of hydrochlorothiazide on a daily basis until fluid is completely resolved.

## 2019-01-05 ENCOUNTER — Other Ambulatory Visit: Payer: Self-pay | Admitting: Adult Health

## 2019-01-21 ENCOUNTER — Ambulatory Visit: Payer: Medicare Other | Admitting: Adult Health

## 2019-01-27 ENCOUNTER — Ambulatory Visit (INDEPENDENT_AMBULATORY_CARE_PROVIDER_SITE_OTHER): Payer: Medicare Other | Admitting: Adult Health

## 2019-01-27 ENCOUNTER — Encounter: Payer: Self-pay | Admitting: Adult Health

## 2019-01-27 ENCOUNTER — Other Ambulatory Visit: Payer: Self-pay

## 2019-01-27 DIAGNOSIS — R197 Diarrhea, unspecified: Secondary | ICD-10-CM | POA: Diagnosis not present

## 2019-01-27 NOTE — Progress Notes (Signed)
Virtual Visit via Video Note  I connected with Veronica Phillips on 01/27/19 at  2:30 PM EDT by a video enabled telemedicine application and verified that I am speaking with the correct person using two identifiers.  Location patient: home Location provider:work or home office Persons participating in the virtual visit: patient, provider, daughter [name])  I discussed the limitations of evaluation and management by telemedicine and the availability of in person appointments. The patient expressed understanding and agreed to proceed.   HPI: 85 year old female who is being evaluated today for worsening diarrhea and some abdominal pain.  Daughter reports that the patient has had diarrhea for most of her life but this seems to be getting worse.  She may have 4-5 bowel movements a day all of which are diarrhea.  The smell has become stronger.  The daughter has not noticed any yellow stool or blood in the stool.  Daughter has taken away milk products as she thought this may be a cause but has not noticed any improvement.  Continues to have a good appetite but is not gaining any weight.  Denies nausea, vomiting, or fevers. She has not been on any antibiotics recently   ROS: See pertinent positives and negatives per HPI.  Past Medical History:  Diagnosis Date  . Arthritis    "knees, shoulders" (12/29/2015)  . Atrophic gastritis   . Cancer of left breast ([place]) 1989  . CKD (chronic kidney disease), stage III ([place])   . Diverticulosis of colon   . GERD (gastroesophageal reflux disease)   . Glaucoma   . Headache    "awful headaches when I was coming up in my teens"  . Hypertension   . Lactose intolerance   . Orthostatic hypotension   . Pernicious anemia   . Vitamin B 12 deficiency     Past Surgical History:  Procedure Laterality Date  . BALLOON DILATION N/A 06/28/2014   Procedure: BALLOON DILATION;  Surgeon: Ladene Artist, MD;  [facility];  Service: Endoscopy;  Laterality: N/A;  .  BOTOX INJECTION N/A 07/09/2013   Procedure: BOTOX INJECTION;  Surgeon: Ladene Artist, MD;  [facility];  Service: Endoscopy;  Laterality: N/A;  . BOTOX INJECTION N/A 06/28/2014   Procedure: BOTOX INJECTION;  Surgeon: Ladene Artist, MD;  [facility];  Service: Endoscopy;  Laterality: N/A;  . CATARACT EXTRACTION W/ INTRAOCULAR LENS  IMPLANT, BILATERAL Bilateral 08-2015-10/2015  . ESOPHAGEAL MANOMETRY N/A 10/27/2012   Procedure: ESOPHAGEAL MANOMETRY (EM);  Surgeon: Ladene Artist, MD;  [facility];  Service: Endoscopy;  Laterality: N/A;  . ESOPHAGOGASTRODUODENOSCOPY N/A 07/09/2013   Procedure: ESOPHAGOGASTRODUODENOSCOPY (EGD);  Surgeon: Ladene Artist, MD;  Location: [facility];  Service: Endoscopy;  Laterality: N/A;  . ESOPHAGOGASTRODUODENOSCOPY N/A 10/07/2013   Procedure: ESOPHAGOGASTRODUODENOSCOPY (EGD);  Surgeon: Ladene Artist, MD;  Location: [facility];  Service: Endoscopy;  Laterality: N/A;  wtih botox  . ESOPHAGOGASTRODUODENOSCOPY (EGD) WITH PROPOFOL N/A 06/28/2014   Procedure: ESOPHAGOGASTRODUODENOSCOPY (EGD) WITH PROPOFOL;  Surgeon: Ladene Artist, MD;  [facility];  Service: Endoscopy;  Laterality: N/A;  . MASTECTOMY Left 1989  . ORIF FEMUR FRACTURE Left 06/16/2018   Procedure: OPEN REDUCTION INTERNAL FIXATION (ORIF) DISTAL FEMUR FRACTURE;  Surgeon: Leandrew Koyanagi, MD;  Location: [facility];  Service: Orthopedics;  Laterality: Left;    Family History  Problem Relation Age of Onset  . Hypertension Mother        family hx  . Lung cancer Father  family hx  . Stroke Other        family hx  . Heart disease Sister        family hx      Current Outpatient Medications:  .  acetaminophen (TYLENOL) 500 MG tablet, Take 500 mg by mouth every 6 (six) hours as needed (for pain). , Disp: , Rfl:  .  Ascorbic Acid (VITAMIN C PO), Take 1 tablet by mouth at bedtime. , Disp: , Rfl:  .  BIOTIN PO, Take 1 tablet by mouth every evening., Disp: , Rfl:   .  Cyanocobalamin (VITAMIN B-12 PO), Take 1 tablet by mouth at bedtime. , Disp: , Rfl:  .  ferrous gluconate (FERGON) 325 MG tablet, Take 325 mg by mouth daily after supper. , Disp: , Rfl:  .  hydrochlorothiazide (MICROZIDE) 12.5 MG capsule, Take 1/2 tab every other day, Disp: 15 capsule, Rfl: 0 .  latanoprost (XALATAN) 0.005 % ophthalmic solution, Place 1 drop into both eyes at bedtime. , Disp: , Rfl: 0 .  midodrine (PROAMATINE) 5 MG tablet, Take 5 mg by mouth 3 (three) times daily with meals., Disp: , Rfl:  .  Multiple Vitamins-Minerals (ONE-A-DAY WOMENS 50 PLUS PO), Take 1 tablet by mouth daily after supper. , Disp: , Rfl:  .  multivitamin-lutein (OCUVITE-LUTEIN) CAPS capsule, Take 1 capsule by mouth daily., Disp: , Rfl:  .  omeprazole (PRILOSEC) 20 MG capsule, Take 1 capsule (20 mg total) by mouth daily., Disp: 90 capsule, Rfl: 3 .  potassium chloride (K-DUR) 10 MEQ tablet, TAKE 1 TABLET(10 MEQ) BY MOUTH DAILY, Disp: 90 tablet, Rfl: 1 .  risperiDONE (RISPERDAL) 0.25 MG tablet, Take 2 tablets (0.5 mg total) by mouth at bedtime., Disp: 180 tablet, Rfl: 1 .  traZODone (DESYREL) 50 MG tablet, Take 1/2 tablet every night, Disp: 15 tablet, Rfl: 5 .  vitamin E 400 UNIT capsule, Take 400 Units by mouth at bedtime. , Disp: , Rfl:   EXAM:  VITALS per patient if applicable:  GENERAL: alert, oriented, appears well and in no acute distress  HEENT: atraumatic, conjunttiva clear, no obvious abnormalities on inspection of external nose and ears  NECK: normal movements of the head and neck  LUNGS: on inspection no signs of respiratory distress, breathing rate appears normal, no obvious gross SOB, gasping or wheezing  CV: no obvious cyanosis  MS: moves all visible extremities without noticeable abnormality  PSYCH/NEURO: pleasant and cooperative, no obvious depression or anxiety, speech and thought processing grossly intact  ASSESSMENT AND PLAN:  Discussed the following assessment and plan:  1.  Diarrhea, unspecified type -Doubt C. difficile since she has not been on any antibiotics recently but will test for C. difficile as well as do a stool culture.  Likely due to malabsorption or functional diarrhea.  Can consider half dose Imodium daily if needed - C. difficile, PCR([facility]) - Stool culture   I discussed the assessment and treatment plan with the patient. The patient was provided an opportunity to ask questions and all were answered. The patient agreed with the plan and demonstrated an understanding of the instructions.   The patient was advised to call back or seek an in-person evaluation if the symptoms worsen or if the condition fails to improve as anticipated.   Dorothyann Peng, NP

## 2019-03-03 NOTE — Addendum Note (Signed)
Addended by: Elmer Picker on: 03/03/2019 04:56 PM   Modules accepted: Orders

## 2019-03-06 ENCOUNTER — Ambulatory Visit: Payer: Self-pay

## 2019-03-06 NOTE — Telephone Encounter (Signed)
Provided lab results of Dorothyann Peng NP of 03/05/19 to daughter [name] of Patient.Voiced Understaninding.Daughteter states "there is still something wrong food appears und digested in stool. " would like to speak with PCP.

## 2019-03-07 LAB — CLOSTRIDIUM DIFFICILE TOXIN B, QUALITATIVE, REAL-TIME PCR: Toxigenic C. Difficile by PCR: NOT DETECTED

## 2019-03-07 LAB — STOOL CULTURE
MICRO NUMBER:: 857197
MICRO NUMBER:: 857198
MICRO NUMBER:: 857199
SHIGA RESULT:: NOT DETECTED
SPECIMEN QUALITY:: ADEQUATE
SPECIMEN QUALITY:: ADEQUATE
SPECIMEN QUALITY:: ADEQUATE

## 2019-03-07 LAB — TIQ-NTM

## 2019-03-17 ENCOUNTER — Other Ambulatory Visit: Payer: Self-pay

## 2019-03-17 ENCOUNTER — Telehealth (INDEPENDENT_AMBULATORY_CARE_PROVIDER_SITE_OTHER): Payer: Medicare Other | Admitting: Adult Health

## 2019-03-17 DIAGNOSIS — K909 Intestinal malabsorption, unspecified: Secondary | ICD-10-CM | POA: Diagnosis not present

## 2019-03-17 DIAGNOSIS — R197 Diarrhea, unspecified: Secondary | ICD-10-CM

## 2019-03-17 IMAGING — CR DG KNEE COMPLETE 4+V*R*
4 series · 4 of 4 positions shown · non-contrast
Comparison: 01/25/2017

CLINICAL DATA: Fall

EXAM:
RIGHT KNEE - COMPLETE 4+ VIEW

[x knee ap right (1 of 3)]
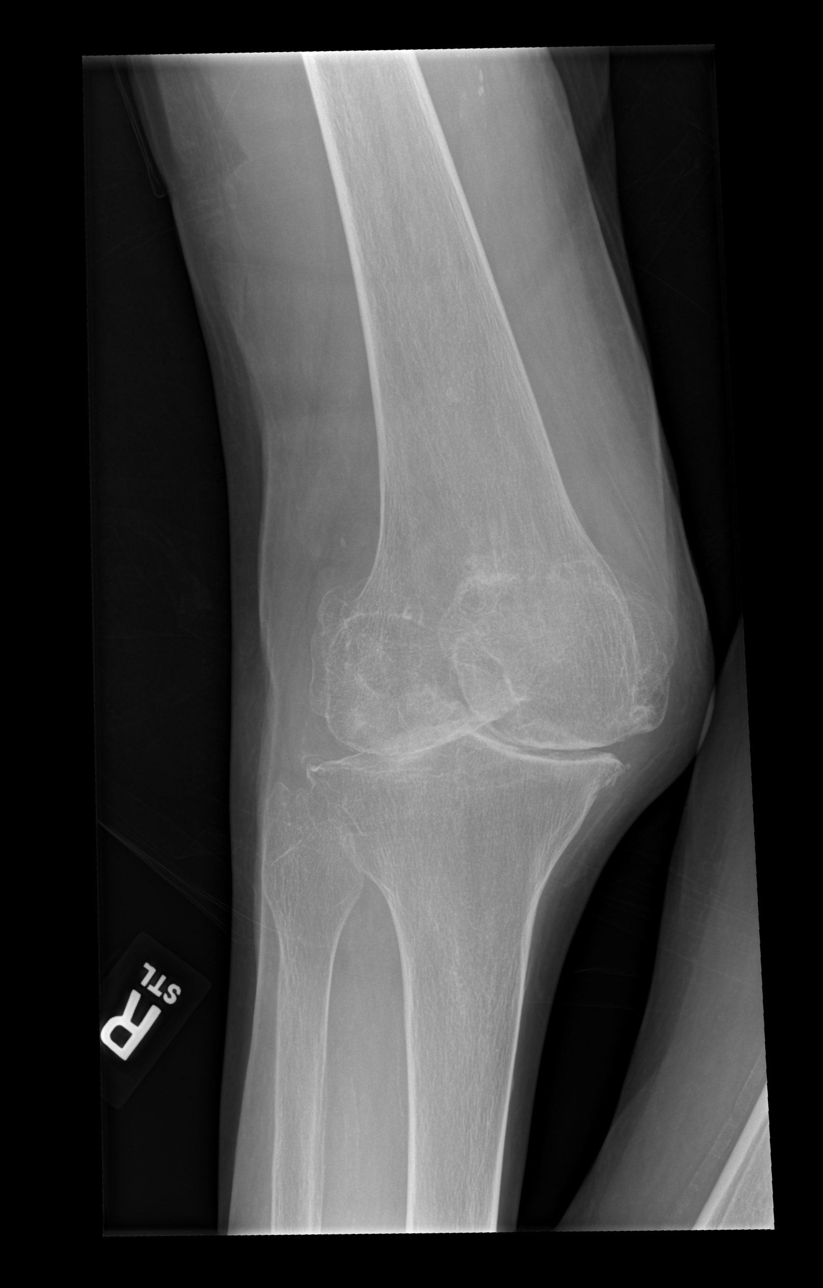

[x knee ap right (2 of 3)]
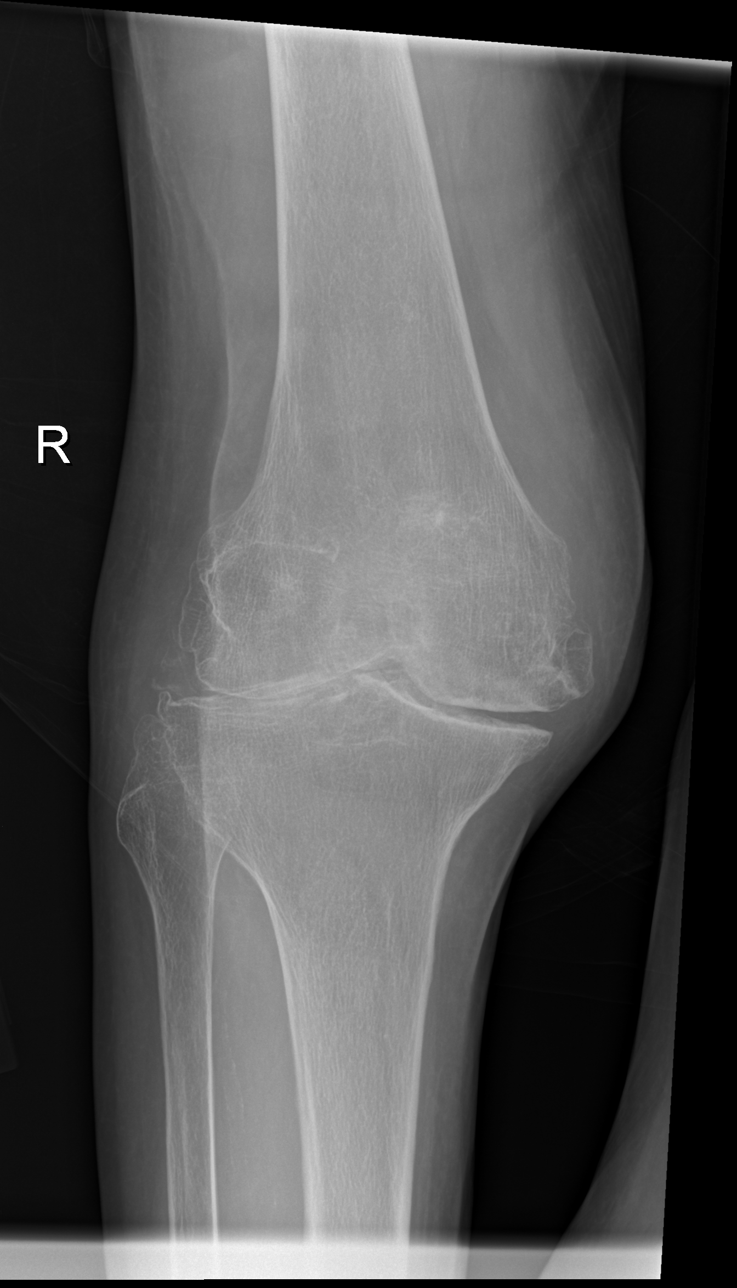

[x knee ap right (3 of 3)]
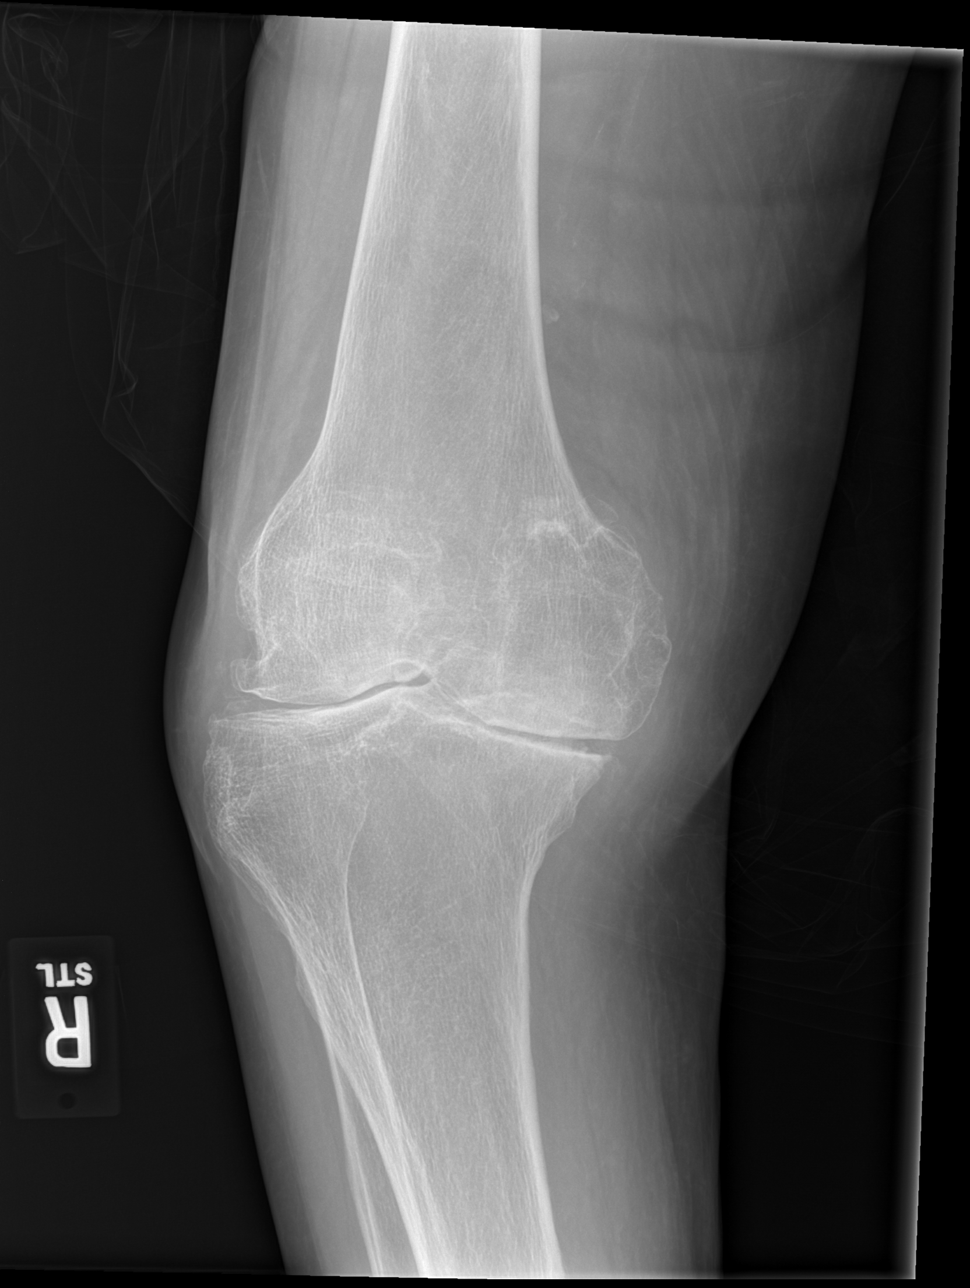

[x knee lat right]
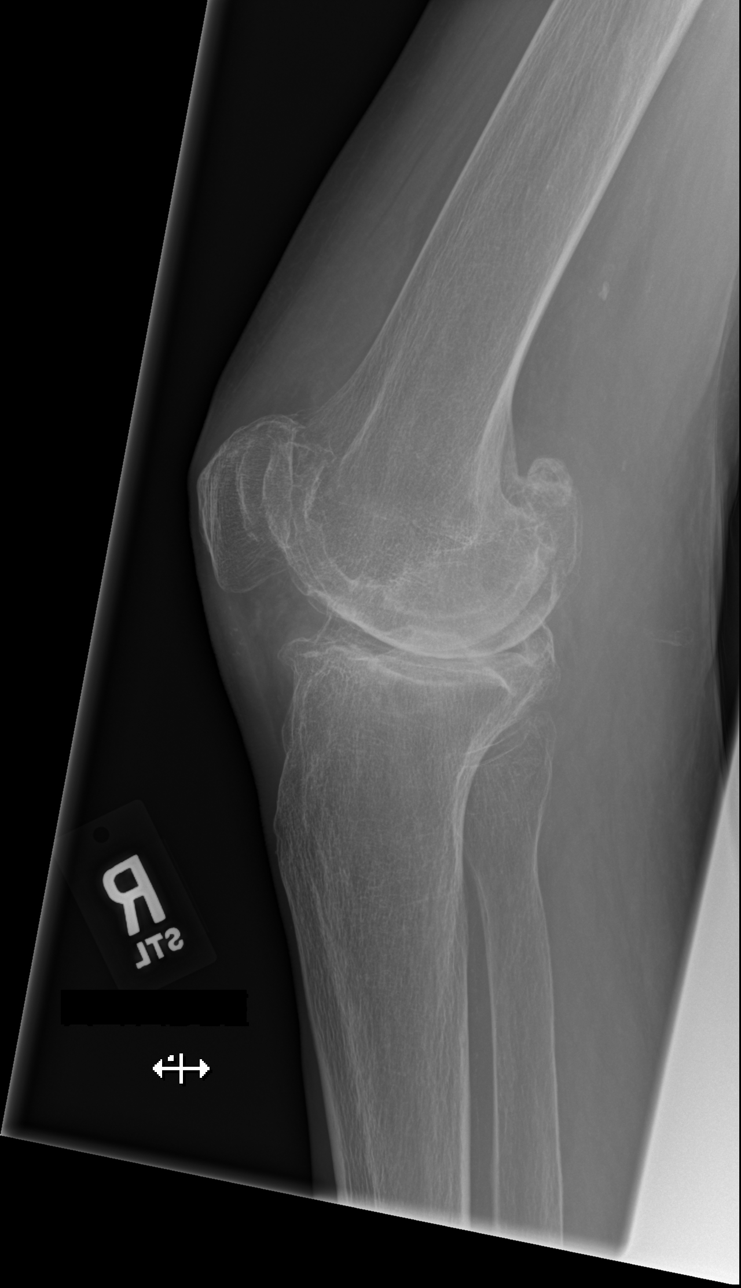

[4 of 4 positions shown; findings below may reference images not displayed]

FINDINGS: No fracture or malalignment. Severe tricompartment arthritis with
joint space narrowing, subarticular sclerosis and osteophyte
formation. Small moderate knee effusion.
IMPRESSION: 1. No acute osseous abnormality.
2. Advanced arthritis of the knee with small moderate knee effusion

## 2019-03-17 IMAGING — CR DG FEMUR 2+V*L*
5 series · 5 of 5 positions shown · non-contrast
Comparison: 02/27/2018, 06/15/2018

CLINICAL DATA: Fall, history of fracture

EXAM:
LEFT FEMUR 2 VIEWS

[x femur proximal ap left]
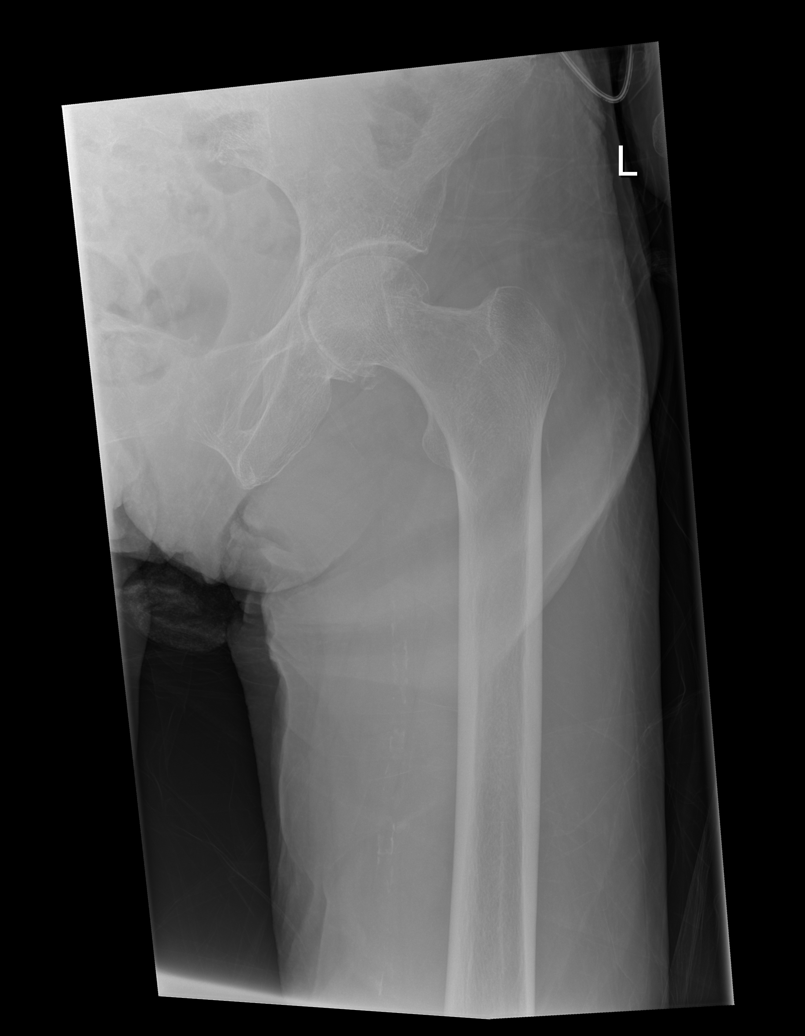

[x femur distal ap left]
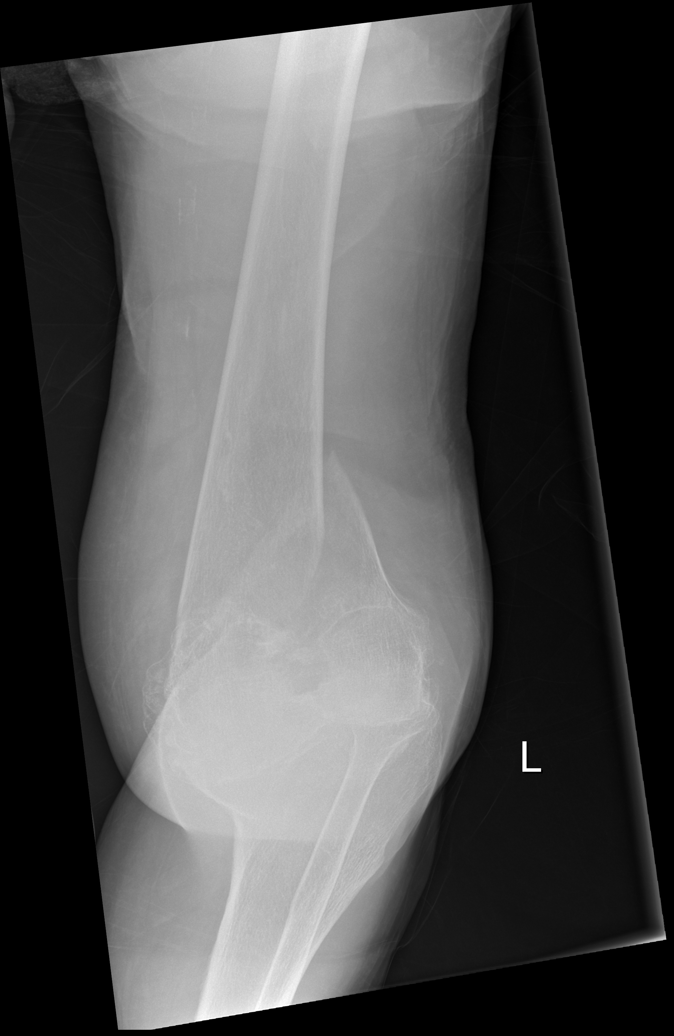

[x femur distal lat left]
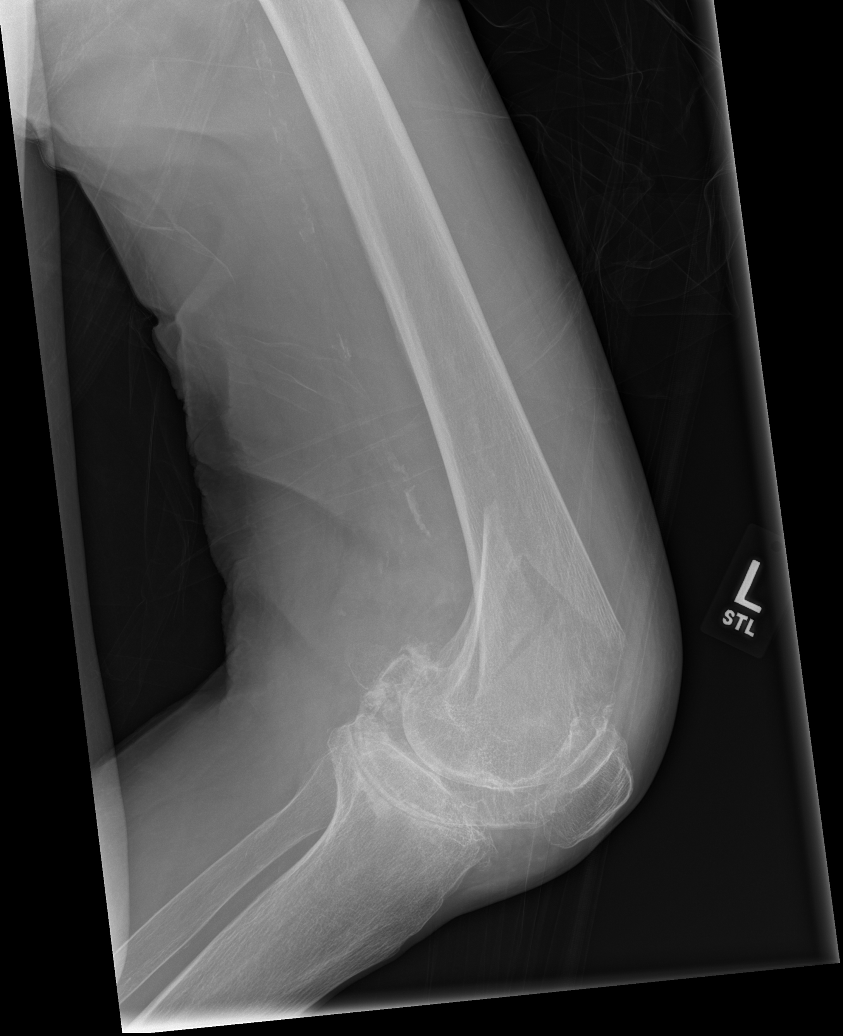

[w hip lat left (1 of 2)]
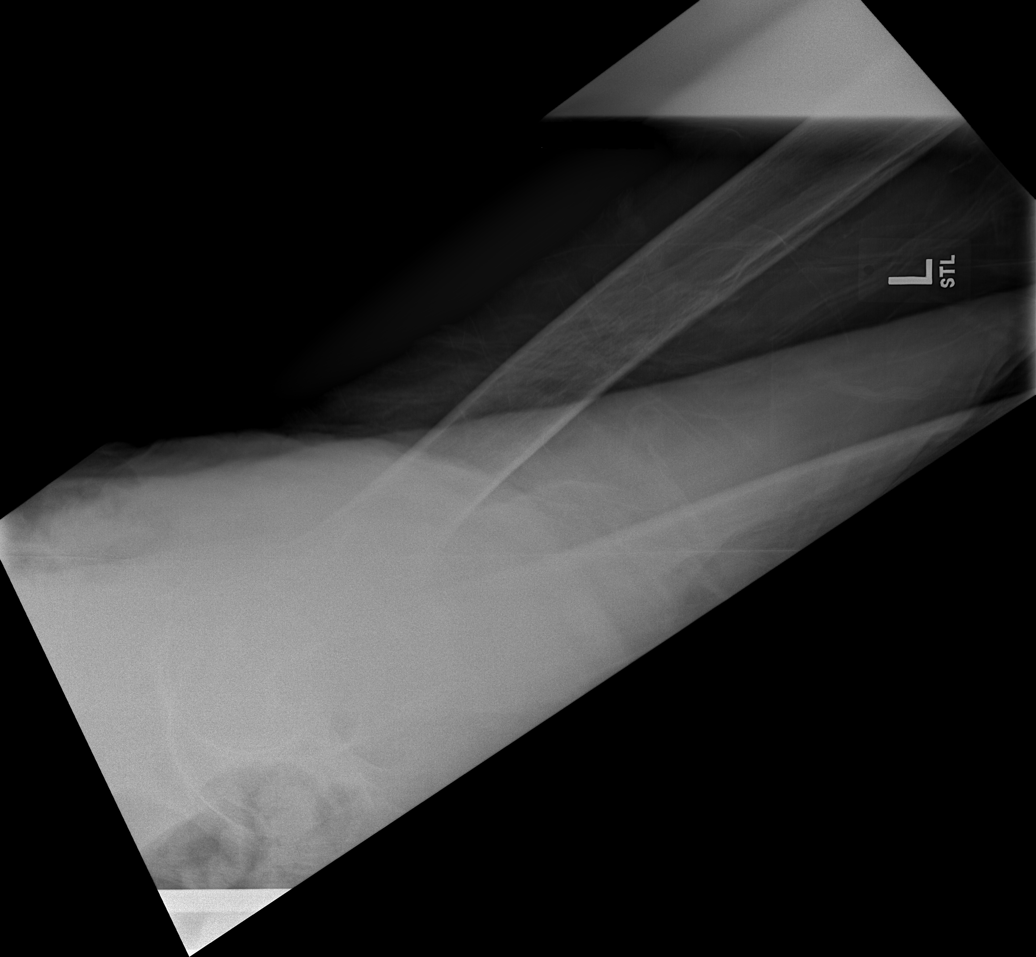

[w hip lat left (2 of 2)]
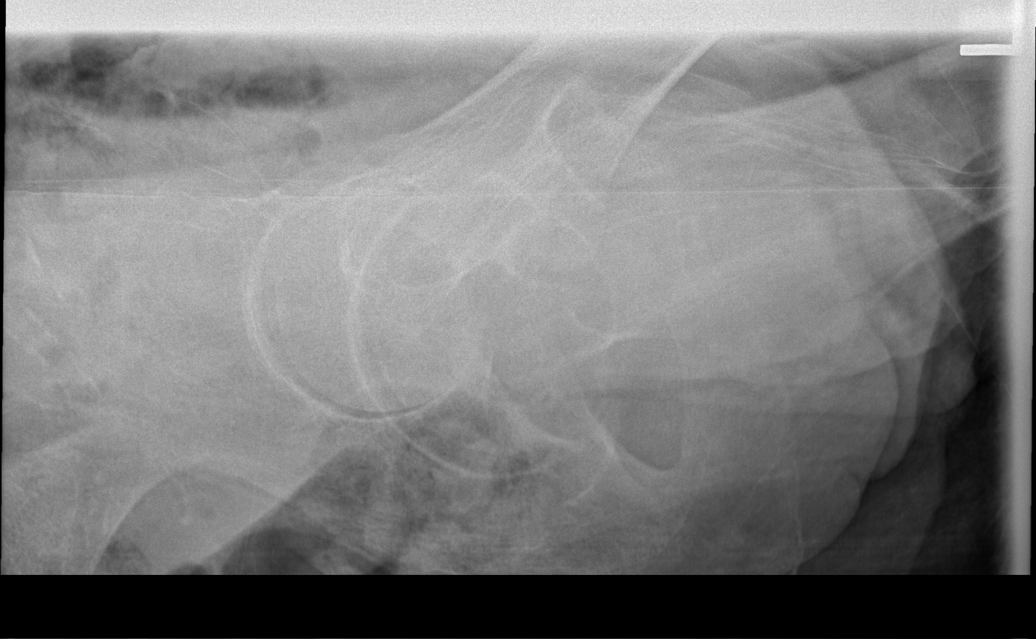

[5 of 5 positions shown; findings below may reference images not displayed]

FINDINGS: Left femoral head projects in joint. Moderate inferior degenerative
changes at the glenohumeral interval. Acute mildly comminuted distal
femoral fracture involving the metaphysis. [DATE] bone with posterior
displacement and lateral displacement of distal fracture fragment.
No knee dislocation. Advanced arthritis at the knee. Vascular
calcifications.
IMPRESSION: Acute mildly comminuted and displaced distal femoral fracture.

## 2019-03-17 NOTE — Telephone Encounter (Signed)
Pt scheduled to see [name] through Virtual Visit today at 4:30.  Nothing further needed.

## 2019-03-17 NOTE — Progress Notes (Addendum)
Virtual Visit via Video Note  I connected with Veronica Phillips on 03/17/19 at  4:30 PM EDT by a video enabled telemedicine application and verified that I am speaking with the correct person using two identifiers.  Location patient: home Location provider:work or home office Persons participating in the virtual visit: patient, provider, daughter  I discussed the limitations of evaluation and management by telemedicine and the availability of in person appointments. The patient expressed understanding and agreed to proceed.   HPI: 85 year old female who is being evaluated today for continued diarrhea.  Was last evaluated for this in August 2020.  She has a longstanding history of diarrhea, which she has had for most of her life.  Recently we checked C. difficile panel as well as a stool culture both came back negative.  The daughter has taken away milk products and has not noticed any improvement.  The patient continues to have a good appetite, but it about 30 to 60 minutes after eating she will have watery diarrhea.  She does report some intermittent mild abdominal pain right before bowel movement.  Denies nausea, vomiting, or fevers.  Daughter restarted her on Imodium 1 tab daily and has noticed some mild improvement in her stools but they continue to be mostly water.   ROS: See pertinent positives and negatives per HPI.  Past Medical History:  Diagnosis Date  . Arthritis    "knees, shoulders" (12/29/2015)  . Atrophic gastritis   . Cancer of left breast ([place]) 1989  . CKD (chronic kidney disease), stage III ([place])   . Diverticulosis of colon   . GERD (gastroesophageal reflux disease)   . Glaucoma   . Headache    "awful headaches when I was coming up in my teens"  . Hypertension   . Lactose intolerance   . Orthostatic hypotension   . Pernicious anemia   . Vitamin B 12 deficiency     Past Surgical History:  Procedure Laterality Date  . BALLOON DILATION N/A 06/28/2014   Procedure:  BALLOON DILATION;  Surgeon: Ladene Artist, MD;  Location: [facility];  Service: Endoscopy;  Laterality: N/A;  . BOTOX INJECTION N/A 07/09/2013   Procedure: BOTOX INJECTION;  Surgeon: Ladene Artist, MD;  Location: [facility];  Service: Endoscopy;  Laterality: N/A;  . BOTOX INJECTION N/A 06/28/2014   Procedure: BOTOX INJECTION;  Surgeon: Ladene Artist, MD;  Location: [facility];  Service: Endoscopy;  Laterality: N/A;  . CATARACT EXTRACTION W/ INTRAOCULAR LENS  IMPLANT, BILATERAL Bilateral 08-2015-10/2015  . ESOPHAGEAL MANOMETRY N/A 10/27/2012   Procedure: ESOPHAGEAL MANOMETRY (EM);  Surgeon: Ladene Artist, MD;  Location: [facility];  Service: Endoscopy;  Laterality: N/A;  . ESOPHAGOGASTRODUODENOSCOPY N/A 07/09/2013   Procedure: ESOPHAGOGASTRODUODENOSCOPY (EGD);  Surgeon: Ladene Artist, MD;  Location: [facility];  Service: Endoscopy;  Laterality: N/A;  . ESOPHAGOGASTRODUODENOSCOPY N/A 10/07/2013   Procedure: ESOPHAGOGASTRODUODENOSCOPY (EGD);  Surgeon: Ladene Artist, MD;  Location: [facility];  Service: Endoscopy;  Laterality: N/A;  wtih botox  . ESOPHAGOGASTRODUODENOSCOPY (EGD) WITH PROPOFOL N/A 06/28/2014   Procedure: ESOPHAGOGASTRODUODENOSCOPY (EGD) WITH PROPOFOL;  Surgeon: Ladene Artist, MD;  [facility];  Service: Endoscopy;  Laterality: N/A;  . MASTECTOMY Left 1989  . ORIF FEMUR FRACTURE Left 06/16/2018   Procedure: OPEN REDUCTION INTERNAL FIXATION (ORIF) DISTAL FEMUR FRACTURE;  Surgeon: Leandrew Koyanagi, MD;  Location: [facility];  Service: Orthopedics;  Laterality: Left;    Family History  Problem Relation Age of Onset  . Hypertension Mother  family hx  . Lung cancer Father        family hx  . Stroke Other        family hx  . Heart disease Sister        family hx     Current Outpatient Medications:  .  acetaminophen (TYLENOL) 500 MG tablet, Take 500 mg by mouth every 6 (six) hours as needed (for pain). , Disp: , Rfl:  .  Ascorbic Acid (VITAMIN C PO), Take  1 tablet by mouth at bedtime. , Disp: , Rfl:  .  BIOTIN PO, Take 1 tablet by mouth every evening., Disp: , Rfl:  .  Cyanocobalamin (VITAMIN B-12 PO), Take 1 tablet by mouth at bedtime. , Disp: , Rfl:  .  ferrous gluconate (FERGON) 325 MG tablet, Take 325 mg by mouth daily after supper. , Disp: , Rfl:  .  hydrochlorothiazide (MICROZIDE) 12.5 MG capsule, Take 1/2 tab every other day, Disp: 15 capsule, Rfl: 0 .  latanoprost (XALATAN) 0.005 % ophthalmic solution, Place 1 drop into both eyes at bedtime. , Disp: , Rfl: 0 .  midodrine (PROAMATINE) 5 MG tablet, Take 5 mg by mouth 3 (three) times daily with meals., Disp: , Rfl:  .  Multiple Vitamins-Minerals (ONE-A-DAY WOMENS 50 PLUS PO), Take 1 tablet by mouth daily after supper. , Disp: , Rfl:  .  multivitamin-lutein (OCUVITE-LUTEIN) CAPS capsule, Take 1 capsule by mouth daily., Disp: , Rfl:  .  omeprazole (PRILOSEC) 20 MG capsule, Take 1 capsule (20 mg total) by mouth daily., Disp: 90 capsule, Rfl: 3 .  potassium chloride (K-DUR) 10 MEQ tablet, TAKE 1 TABLET(10 MEQ) BY MOUTH DAILY, Disp: 90 tablet, Rfl: 1 .  risperiDONE (RISPERDAL) 0.25 MG tablet, Take 2 tablets (0.5 mg total) by mouth at bedtime., Disp: 180 tablet, Rfl: 1 .  traZODone (DESYREL) 50 MG tablet, Take 1/2 tablet every night, Disp: 15 tablet, Rfl: 5 .  vitamin E 400 UNIT capsule, Take 400 Units by mouth at bedtime. , Disp: , Rfl:   EXAM:  VITALS per patient if applicable:  GENERAL: alert, oriented, appears well and in no acute distress  HEENT: atraumatic, conjunttiva clear, no obvious abnormalities on inspection of external nose and ears  NECK: normal movements of the head and neck  LUNGS: on inspection no signs of respiratory distress, breathing rate appears normal, no obvious gross SOB, gasping or wheezing  CV: no obvious cyanosis  MS: moves all visible extremities without noticeable abnormality  PSYCH/NEURO: pleasant and cooperative, no obvious depression or anxiety, speech  and thought processing grossly intact  ASSESSMENT AND PLAN:  Discussed the following assessment and plan: 1. Diarrhea due to malabsorption -Likely due to malabsorption.  Advised to continue to use Imodium, one half tab 3 times daily as needed until her stools are formed and then can back off.  Cannot rule out other causes such as microcytic colitis.  Daughter would like to hold off from any additional medications at this time.  Unfortunately cannot use bentyl  because patient continues to deal with some episodes of venous and orthostatic hypotension.  They do not want to see gastroenterology at this time   I discussed the assessment and treatment plan with the patient. The patient was provided an opportunity to ask questions and all were answered. The patient agreed with the plan and demonstrated an understanding of the instructions.   The patient was advised to call back or seek an in-person evaluation if the symptoms worsen or if the  condition fails to improve as anticipated.   Dorothyann Peng, NP

## 2019-03-18 ENCOUNTER — Ambulatory Visit (INDEPENDENT_AMBULATORY_CARE_PROVIDER_SITE_OTHER): Payer: Medicare Other | Admitting: Podiatry

## 2019-03-18 ENCOUNTER — Other Ambulatory Visit: Payer: Self-pay

## 2019-03-18 DIAGNOSIS — M79674 Pain in right toe(s): Secondary | ICD-10-CM | POA: Diagnosis not present

## 2019-03-18 DIAGNOSIS — M79675 Pain in left toe(s): Secondary | ICD-10-CM

## 2019-03-18 DIAGNOSIS — B351 Tinea unguium: Secondary | ICD-10-CM

## 2019-03-18 IMAGING — RF DG FEMUR 2+V*L*
1 series · 5 of 5 positions shown · non-contrast
Comparison: Left knee radiographs 06/15/2018

CLINICAL DATA: ORIF of the left femur.

EXAM:
DG C-ARM 61-120 MIN; LEFT FEMUR 2 VIEWS

[Series 1: run · 5 of 5 slices shown]
[im 1/5]
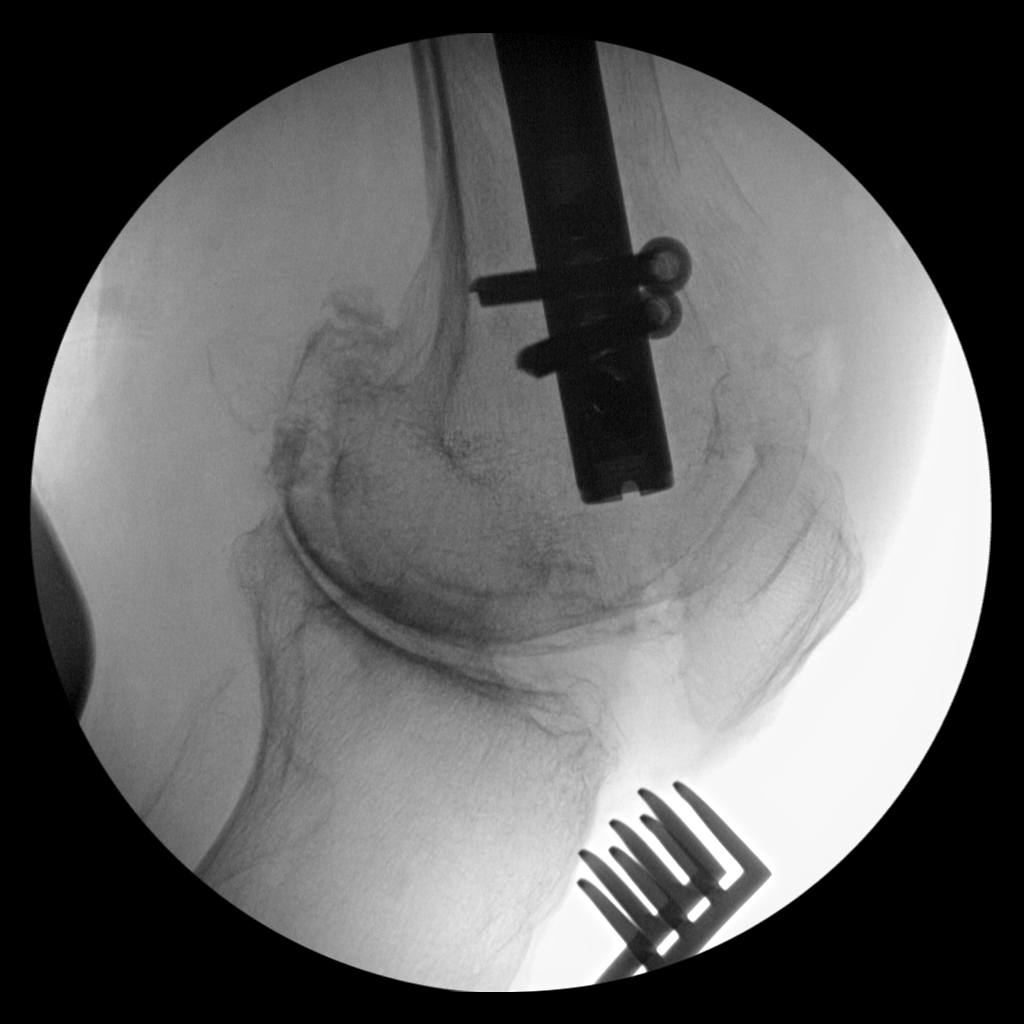
[im 2/5]
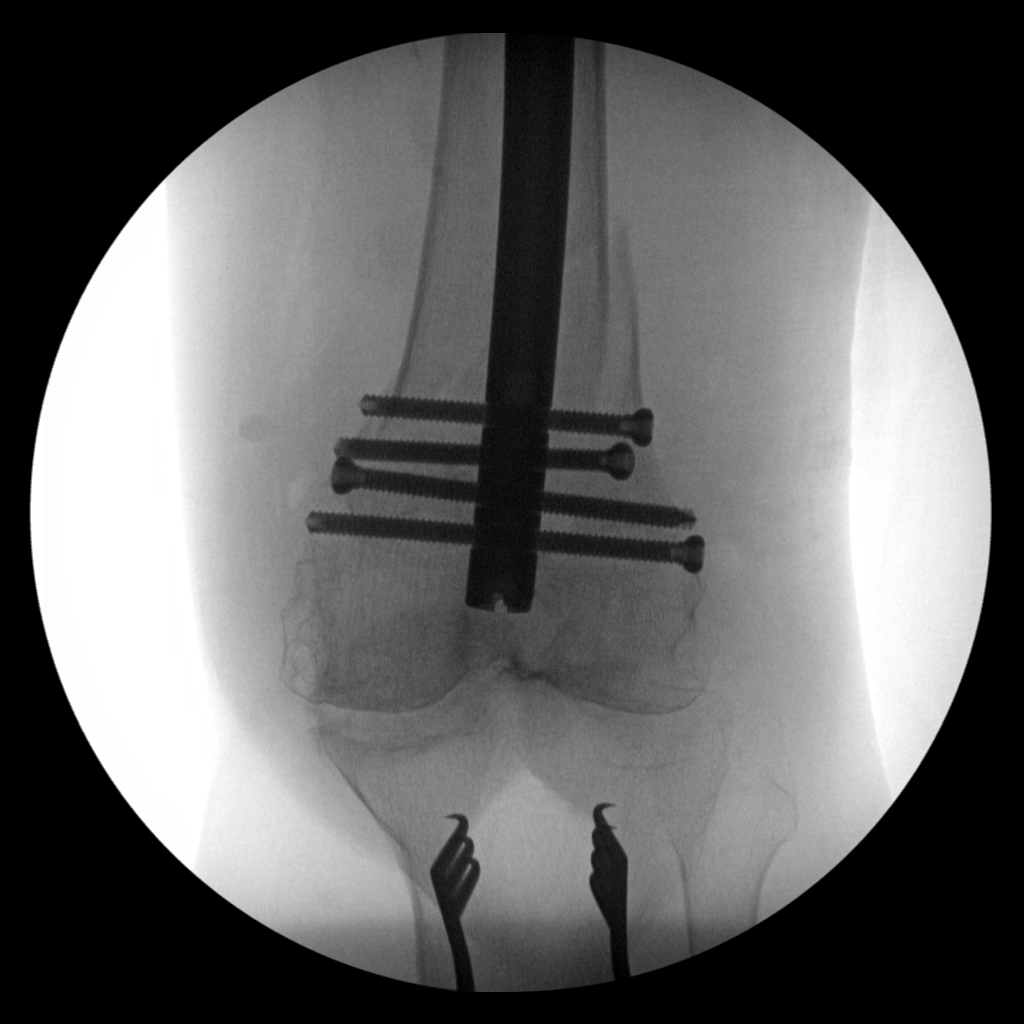
[im 3/5]
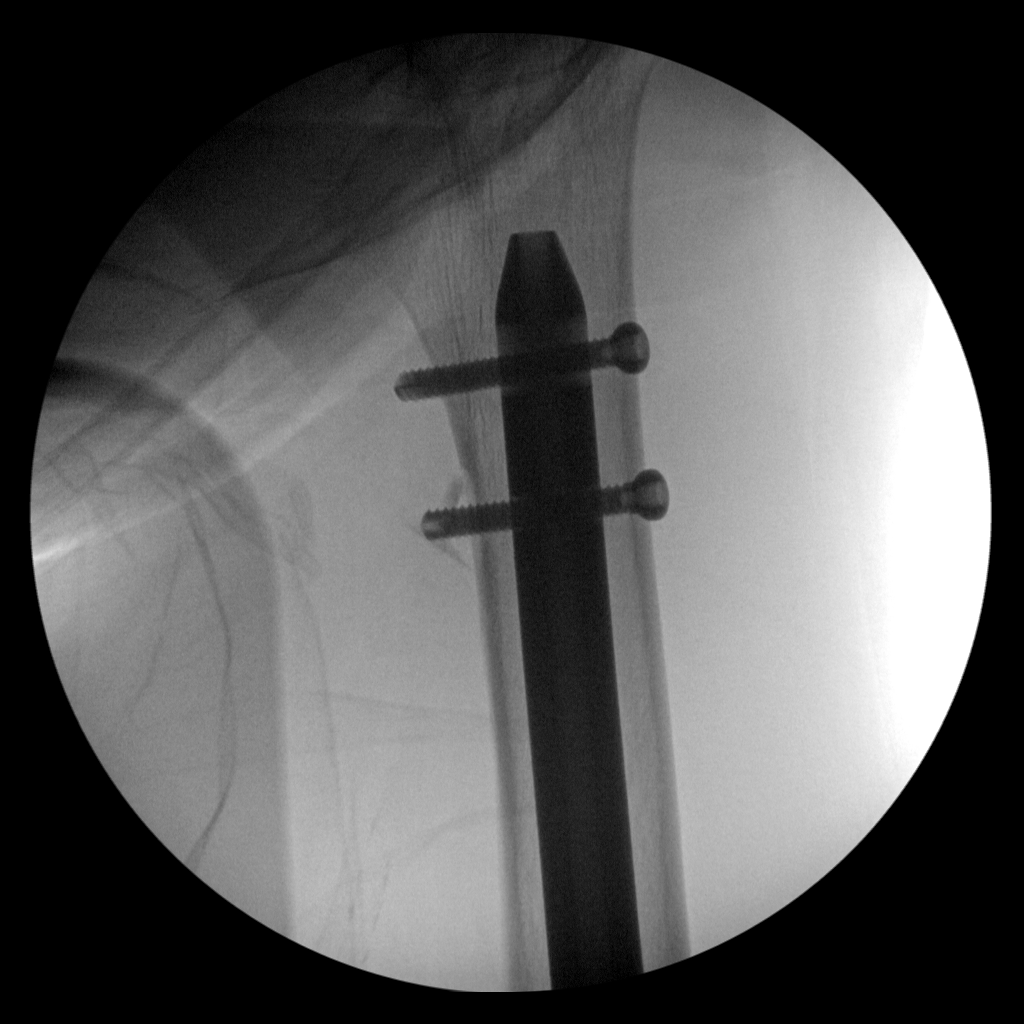
[im 4/5]
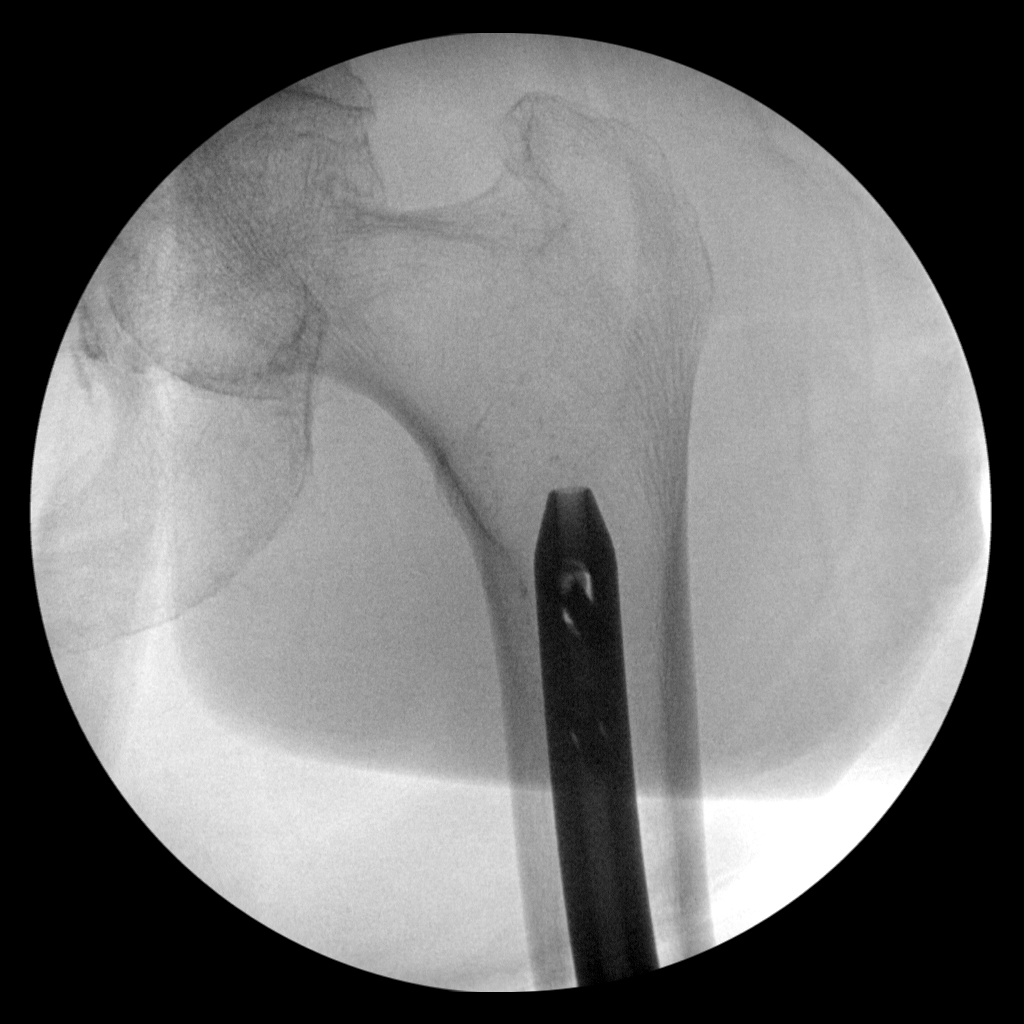
[im 5/5]
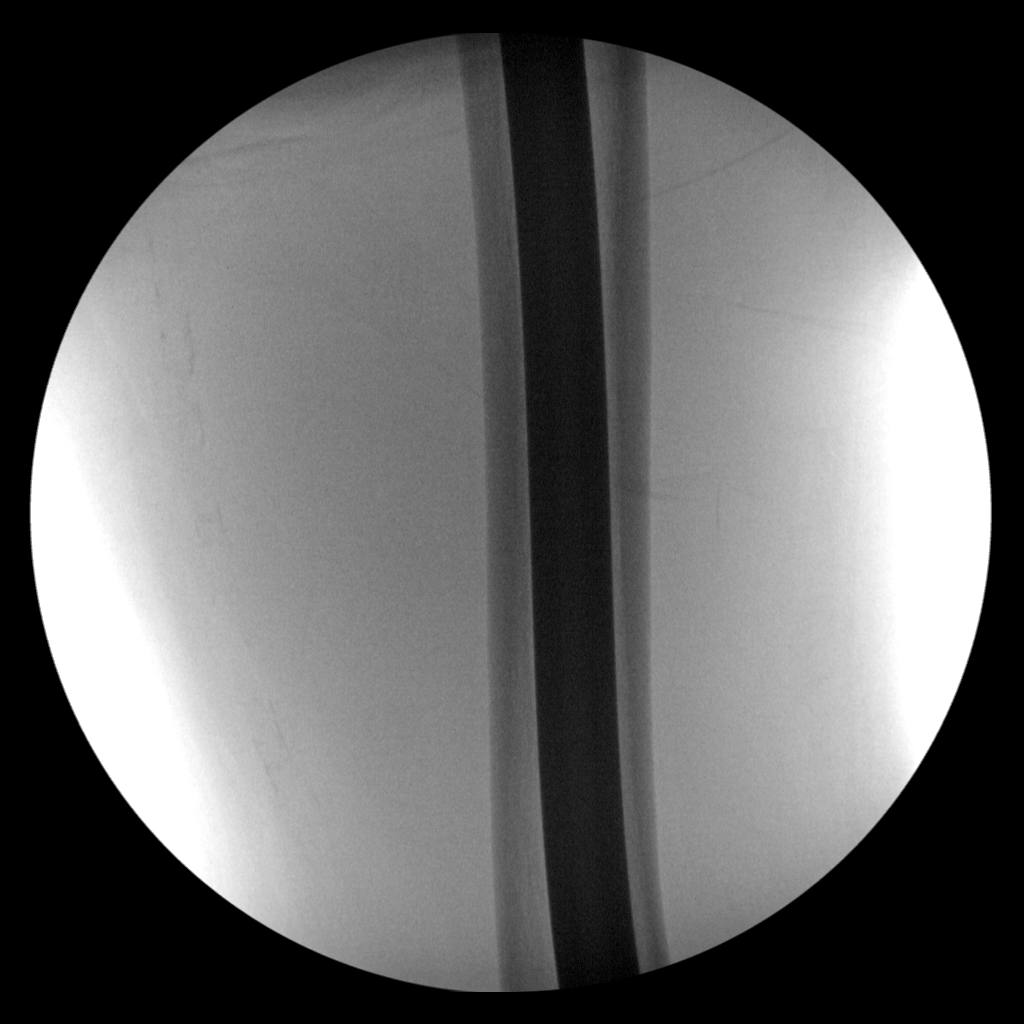

[5 of 5 positions shown; findings below may reference images not displayed]

FLUOROSCOPY TIME:  Fluoroscopy Time:  2 minutes 27 seconds

Number of Acquired Spot Images: 5
FINDINGS: A reverse IM nail is in place. For transverse screws are present at
the level of fracture. The fracture is reduced. Knee is located. Two
proximal interlocking screws are present. There is slight
displacement of the cortex at the tip of the more distal of the 2
proximal interlocking screws.
IMPRESSION: 1. Status post ORIF with reverse IM nail through the distal femur.
2. Near anatomic reduction.

## 2019-03-18 NOTE — Patient Instructions (Signed)

## 2019-03-24 ENCOUNTER — Encounter: Payer: Self-pay | Admitting: Podiatry

## 2019-03-24 NOTE — Progress Notes (Signed)
Subjective: Veronica Phillips is seen today for follow up painful, elongated, thickened toenails 1-5 b/l feet that she cannot cut. Pain interferes with daily activities. Aggravating factor includes wearing enclosed shoe gear and relieved with periodic debridement.  She is accompanied by her daughter on today's visit.   Current Outpatient Medications on File Prior to Visit  Medication Sig  . acetaminophen (TYLENOL) 500 MG tablet Take 500 mg by mouth every 6 (six) hours as needed (for pain).   . Ascorbic Acid (VITAMIN C PO) Take 1 tablet by mouth at bedtime.   Marland Kitchen BIOTIN PO Take 1 tablet by mouth every evening.  . Cyanocobalamin (VITAMIN B-12 PO) Take 1 tablet by mouth at bedtime.   . ferrous gluconate (FERGON) 325 MG tablet Take 325 mg by mouth daily after supper.   . hydrochlorothiazide (MICROZIDE) 12.5 MG capsule Take 1/2 tab every other day  . latanoprost (XALATAN) 0.005 % ophthalmic solution Place 1 drop into both eyes at bedtime.   . midodrine (PROAMATINE) 5 MG tablet Take 5 mg by mouth 3 (three) times daily with meals.  . Multiple Vitamins-Minerals (ONE-A-DAY WOMENS 50 PLUS PO) Take 1 tablet by mouth daily after supper.   . multivitamin-lutein (OCUVITE-LUTEIN) CAPS capsule Take 1 capsule by mouth daily.  Marland Kitchen omeprazole (PRILOSEC) 20 MG capsule Take 1 capsule (20 mg total) by mouth daily.  . potassium chloride (K-DUR) 10 MEQ tablet TAKE 1 TABLET(10 MEQ) BY MOUTH DAILY  . risperiDONE (RISPERDAL) 0.25 MG tablet Take 2 tablets (0.5 mg total) by mouth at bedtime.  . traZODone (DESYREL) 50 MG tablet Take 1/2 tablet every night  . vitamin E 400 UNIT capsule Take 400 Units by mouth at bedtime.    No current facility-administered medications on file prior to visit.      Allergies  Allergen Reactions  . Septra [Bactrim] Swelling    Site of swelling not recalled by the patient  . Lactose Intolerance (Gi) Diarrhea  . Sulfa Antibiotics Swelling    Site of swelling not recalled by the patient  .  Tape Other (See Comments)    Skin is sensitive; please use paper tape!!   Objective:  Vascular Examination: Capillary refill time immediate x 10 digits.  Dorsalis pedis present b/l.  Posterior tibial pulses nonpalpable b/l.  Digital hair absent b/l.  Skin temperature gradient WNL b/l.  Edema BLE. No breaks in skin. No blisters. No warmth b/l.  Dermatological Examination: Skin with normal turgor, texture and tone b/l.  Toenails 1-5 b/l discolored, thick, dystrophic with subungual debris and pain with palpation to nailbeds due to thickness of nails.  Musculoskeletal: Muscle strength 5/5 to all LE muscle groups  No gross bony deformities b/l.  No pain, crepitus or joint limitation noted with ROM.   Neurological Examination: Protective sensation intact with 10 gram monofilament bilaterally.  Epicritic sensation present bilaterally.  Vibratory sensation intact bilaterally.   Assessment: Painful onychomycosis toenails 1-5 b/l   Plan: 1. Toenails 1-5 b/l were debrided in length and girth without iatrogenic bleeding. 2. Patient to continue soft, supportive shoe gear. 3. Patient to report any pedal injuries to medical professional immediately. 4. Follow up 3 months.  5. Patient/POA to call should there be a concern in the interim.

## 2019-03-27 ENCOUNTER — Other Ambulatory Visit: Payer: Self-pay | Admitting: Adult Health

## 2019-03-27 NOTE — Telephone Encounter (Signed)
Sent to the pharmacy by e-scribe. 

## 2019-03-27 NOTE — Telephone Encounter (Signed)
Ok to refill for one year  

## 2019-04-01 ENCOUNTER — Telehealth: Payer: Self-pay | Admitting: Family Medicine

## 2019-04-01 NOTE — Telephone Encounter (Signed)
Copied from [place] [phone]. Topic: General - Other >> Mar 31, 2019 12:47 PM Keene Breath wrote: Reason for CRM: Patient's daughter, [name], called to ask Riccardo Holeman to call regarding patient's medication.  CB# [phone]

## 2019-04-01 NOTE — Telephone Encounter (Signed)
Risperdone has to be titrated down slowly or she will experience withdrawal symptoms, such as abdominal pain, headaches, insomnia.   Passing out episodes should not be from stopping Risperdal. She can increase Midodrine from 5 mg to 10 mg TID

## 2019-04-01 NOTE — Telephone Encounter (Signed)
Spoke to [place].  Pt has been passing out when she stands up.  Also, she will not take her risperidone.  Complaining of abdominal pain.  Not sure if from the medication.  [name] wanted to make sure there will be no side effects from abruptly stopping medication.  What should she do about pt passing out?

## 2019-04-01 NOTE — Telephone Encounter (Signed)
Spoke to Veronica Phillips and advised that ideally [name] would like the pt to stay on the risperidone and that medication should not be stopped abruptly.  She will restart and wean down to taking every other day and then every 2 days until stopped.  Will try taking midodrine TID to see if that helps with passing out.  Nothing further needed.

## 2019-04-14 ENCOUNTER — Telehealth: Payer: Self-pay

## 2019-04-14 NOTE — Telephone Encounter (Signed)
Copied from [place] [phone]. Topic: General - Other >> Apr 14, 2019  9:43 AM Leward Quan A wrote: Reason for CRM: Veronica Phillips called to ask Misty to give her a call back to discuss establishing palliative care for her mother.  Ph# [phone]

## 2019-04-15 NOTE — Telephone Encounter (Signed)
[name] notified that palliative care has been contacted and she should get a call.  Nothing further needed.

## 2019-04-15 NOTE — Telephone Encounter (Signed)
I think this is a great option. I am ok with it

## 2019-04-22 NOTE — Telephone Encounter (Signed)
Patient's daughter called to say that she still has not heard anything about the referral for palliative care for her mother.  Please advise.

## 2019-04-23 ENCOUNTER — Telehealth: Payer: Self-pay | Admitting: Internal Medicine

## 2019-04-23 NOTE — Telephone Encounter (Signed)
Spoke to Alanreed at palliative care.  They had not been able to reach the pt.  Melina Modena the telephone # to reach [place].  Marzetta Board will reach [name] today.  Left a message on Jean's voicemail informing her to look for the call.  Nothing further needed.

## 2019-04-23 NOTE — Telephone Encounter (Signed)
Spoke with patient's daughter [name] regarding Palliative services and she was in agreement with this.  I have scheduled a [place] for 05/01/19 @ 11 Am

## 2019-04-24 ENCOUNTER — Telehealth: Payer: Self-pay

## 2019-04-24 NOTE — Telephone Encounter (Signed)
Spoke to [place] and explained that I do not have a DPR on file to speak with him.  He did say that he has spoke with [name] and is very concerned about his mother.  She is skin and bones and is hardly eating.  He would like to have an order for a feeding tube.  Advised that I could call and discuss with [name].  He agreed but was upset that I could not discuss with him.  [name] said she thought the feeding tube was a good idea.  She is also concerned about her mother.  BP continues to fluctuate. They have an upcoming appointment with Palliative Care on 05/01/2019.  Will forward to [facility] for instruction.

## 2019-04-24 NOTE — Telephone Encounter (Signed)
Copied from [place] [phone]. Topic: General - Other >> Apr 24, 2019  2:09 PM [name] wrote:  Pt son calling has concerns about pt and would like a call back today

## 2019-04-24 NOTE — Telephone Encounter (Signed)
Let VM on pt daughters vm about concerns she has on the patient. Will try her again when I am back in the office

## 2019-04-28 ENCOUNTER — Telehealth: Payer: Self-pay | Admitting: Adult Health

## 2019-04-28 NOTE — Telephone Encounter (Signed)
Left VM to call back to the office

## 2019-04-28 NOTE — Telephone Encounter (Signed)
Patient returned call from Dr [name] . Please advise . Called office no answer

## 2019-05-01 ENCOUNTER — Other Ambulatory Visit: Payer: Self-pay

## 2019-05-01 ENCOUNTER — Other Ambulatory Visit: Payer: Medicare Other | Admitting: Internal Medicine

## 2019-05-08 ENCOUNTER — Other Ambulatory Visit: Payer: Self-pay

## 2019-05-08 ENCOUNTER — Other Ambulatory Visit: Payer: Medicare Other | Admitting: Internal Medicine

## 2019-05-08 DIAGNOSIS — Z515 Encounter for palliative care: Secondary | ICD-10-CM

## 2019-05-08 NOTE — Progress Notes (Signed)
[facility] Consult Note Telephone: [phone]  Fax: [phone]  PATIENT NAME: Veronica Phillips DOB: 27-Jun-1922 MRN: [id number]  PRIMARY CARE PROVIDER:   Dorothyann Peng, NP  REFERRING PROVIDER:  Dorothyann Peng, NP [place],  [place] [address]  RESPONSIBLE PARTY:   Daughter       RECOMMENDATIONS and PLAN:  Palliative care encounter Z51.5   1.  Advance care planning:  DNAR and MOST forms completed and in home  2.  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  Cooperative.  Continue supportive care  3.  Dysphagia with weight loss:  Unchanged.  Continue aspiration precautions.  Comfort feedings throughout the day.  I spent 35 minutes providing this consultation,  from 1400 to 1435. More than 50% of the time in this consultation was spent coordinating communication with patient and daugter.   HISTORY OF PRESENT ILLNESS: Follow-up with Veronica Phillips.  Daughter reports improvement of nutritional intake.  No acute illnesses. She still requires assistance with all ADLs.   Palliative Care was asked to help address goals of care.   CODE STATUS: DNAR/DNI  PPS: 30% HOSPICE ELIGIBILITY/DIAGNOSIS: TBD  PAST MEDICAL HISTORY:  Past Medical History:  Diagnosis Date   Arthritis    "knees, shoulders" (12/29/2015)   Atrophic gastritis    Cancer of left breast ([place]) 1989   CKD (chronic kidney disease), stage III ([facility])    Diverticulosis of colon    GERD (gastroesophageal reflux disease)    Glaucoma    Headache    "awful headaches when I was coming up in my teens"   Hypertension    Lactose intolerance    Orthostatic hypotension    Pernicious anemia    Vitamin B 12 deficiency      PERTINENT MEDICATIONS:  Outpatient Encounter Medications as of 05/08/2019  Medication Sig   acetaminophen (TYLENOL) 500 MG tablet Take 500 mg by mouth every 6 (six) hours as needed (for pain).    Ascorbic Acid (VITAMIN C PO) Take 1 tablet  by mouth at bedtime.    BIOTIN PO Take 1 tablet by mouth every evening.   Cyanocobalamin (VITAMIN B-12 PO) Take 1 tablet by mouth at bedtime.    ferrous gluconate (FERGON) 325 MG tablet Take 325 mg by mouth daily after supper.    hydrochlorothiazide (MICROZIDE) 12.5 MG capsule Take 1/2 tab every other day   latanoprost (XALATAN) 0.005 % ophthalmic solution Place 1 drop into both eyes at bedtime.    midodrine (PROAMATINE) 5 MG tablet TAKE 1 TABLET(5 MG) BY MOUTH THREE TIMES DAILY WITH MEALS   Multiple Vitamins-Minerals (ONE-A-DAY WOMENS 50 PLUS PO) Take 1 tablet by mouth daily after supper.    multivitamin-lutein (OCUVITE-LUTEIN) CAPS capsule Take 1 capsule by mouth daily.   omeprazole (PRILOSEC) 20 MG capsule Take 1 capsule (20 mg total) by mouth daily.   potassium chloride (K-DUR) 10 MEQ tablet TAKE 1 TABLET(10 MEQ) BY MOUTH DAILY   risperiDONE (RISPERDAL) 0.25 MG tablet Take 2 tablets (0.5 mg total) by mouth at bedtime.   traZODone (DESYREL) 50 MG tablet Take 1/2 tablet every night   vitamin E 400 UNIT capsule Take 400 Units by mouth at bedtime.    No facility-administered encounter medications on file as of 05/08/2019.     PHYSICAL EXAM:   General: NAD, frail appearing, thin elderly female in bed Cardiovascular: regular rate and rhythm Pulmonary: clear ant fields Abdomen: soft, nontender, + bowel sounds Extremities: no edema,  no muscle mass Skin: exposed skin is intact.  Mild erythema of coccyx Neurological: somnolent and arouses to name, weakness.  Oriented to person only  Gonzella Lex, NP-C

## 2019-05-12 ENCOUNTER — Other Ambulatory Visit: Payer: Self-pay | Admitting: Adult Health

## 2019-05-12 MED ORDER — MEGESTROL ACETATE 40 MG/ML PO SUSP: 400.0000 mg | Freq: Every day | ORAL | 3 refills | Status: AC

## 2019-05-26 ENCOUNTER — Other Ambulatory Visit: Payer: Medicare Other | Admitting: Internal Medicine

## 2019-05-27 ENCOUNTER — Other Ambulatory Visit: Payer: Self-pay | Admitting: Family Medicine

## 2019-05-27 DIAGNOSIS — R4189 Other symptoms and signs involving cognitive functions and awareness: Secondary | ICD-10-CM

## 2019-05-27 MED ORDER — RISPERIDONE 0.25 MG PO TABS: 0.5000 mg | ORAL_TABLET | Freq: Every day | ORAL | 1 refills | Status: AC

## 2019-06-16 ENCOUNTER — Telehealth (INDEPENDENT_AMBULATORY_CARE_PROVIDER_SITE_OTHER): Payer: Medicare Other | Admitting: Family Medicine

## 2019-06-16 ENCOUNTER — Encounter: Payer: Self-pay | Admitting: Family Medicine

## 2019-06-16 ENCOUNTER — Other Ambulatory Visit: Payer: Self-pay

## 2019-06-16 ENCOUNTER — Ambulatory Visit: Payer: Medicare Other | Admitting: Podiatry

## 2019-06-16 ENCOUNTER — Telehealth: Payer: Self-pay | Admitting: *Deleted

## 2019-06-16 DIAGNOSIS — L989 Disorder of the skin and subcutaneous tissue, unspecified: Secondary | ICD-10-CM | POA: Diagnosis not present

## 2019-06-16 DIAGNOSIS — M25452 Effusion, left hip: Secondary | ICD-10-CM

## 2019-06-17 ENCOUNTER — Ambulatory Visit: Payer: Medicare Other | Admitting: Neurology

## 2019-06-17 ENCOUNTER — Telehealth: Payer: Self-pay | Admitting: Internal Medicine

## 2019-06-17 NOTE — Telephone Encounter (Signed)
Phone call from daughter who reports new onset of a "pressure sore" of pt's sacral region and swelling of her L hip.  She denies redness of skin of the hip and sacral would is approximately quarter sized.   She does report that pt rolled off of her sofa aprox 2 weeks ago.  No known injury at that time.  Pt is currently non-ambulatory.   Instructed to apply a dry dressing to wound today until evaluation. Appointment made with daughter to visit pt on 06/17/19 at 1:30pm.  Call EMS if pt's condition changes prior to home visit.  Daughter agreed with plan.             Gonzella Lex, NP-C

## 2019-06-24 ENCOUNTER — Telehealth: Payer: Self-pay | Admitting: Adult Health

## 2019-06-24 ENCOUNTER — Telehealth: Payer: Self-pay | Admitting: *Deleted

## 2019-06-24 NOTE — Telephone Encounter (Signed)
Called funeral home and advised they pick up death certificate.  Nothing further needed.

## 2019-06-24 NOTE — Telephone Encounter (Signed)
Received death certificate on patient. Death Certificate filled out and given to [facility]

## 2019-06-24 NOTE — Telephone Encounter (Signed)
[facility] dropped a death certificate to be completed by the provider.  Upon completion funeral home would liked to be called [phone] to pick up.  Gave to [place].

## 2019-06-24 NOTE — Telephone Encounter (Signed)
Copied from [place] [phone]. Topic: General - Deceased Patient >> 07-07-2019 11:22 AM Alanda Slim E wrote: Reason for CRM: community funeral home will be bringing by the Pt death certificate for Veronica Phillips to sign

## 2019-06-25 ENCOUNTER — Telehealth: Payer: Self-pay

## 2019-06-25 NOTE — Telephone Encounter (Signed)
Copied from [place] [phone]. Topic: General - Deceased Patient >> 2019/07/17 11:22 AM Alanda Slim E wrote: Reason for CRM: community funeral home will be bringing by the Pt death certificate for Veronica Phillips to sign

## 2019-06-25 NOTE — Telephone Encounter (Signed)
Funeral home picked up death certificate on [id number]

## 2019-06-26 NOTE — Telephone Encounter (Signed)
Copied from [place] [phone]. Topic: General - Other >> 2019-07-07 12:47 PM Greggory Keen D wrote: Reason for CRM: daughter called saying mom has a bed sore that just developed and she wants to know what she needs to do for it.  She also has a swollen hip from fluid.  CB#  [phone]

## 2019-06-26 NOTE — Telephone Encounter (Signed)
Spoke to [place].  She said the pt has a small bedsore on her right buttocks and hip swelling.  Pt is not able to come to the office.  [name] is capable of moving the camera and assisting with the virtual visit.  Scheduled with Dr. Maudie Mercury today as [name] did not want to wait until tomorrow for [facility] to see her.  Will forward to Dr. Maudie Mercury as Juluis Rainier.

## 2019-06-26 NOTE — Progress Notes (Signed)
Virtual Visit via Video Note  I connected with Amauri  on 06/25/19 at  3:20 PM EST by a video enabled telemedicine application and verified that I am speaking with the correct person using two identifiers.  Location patient: home Location provider:work or home office Persons participating in the virtual visit: patient, provider, Avonda  I discussed the limitations of evaluation and management by telemedicine and the availability of in person appointments. The patient expressed understanding and agreed to proceed.   HPI:  Acute visit for a skin wound: -first noticed this morning when she was helping her change -patient is bed-bound per daughter, and patient needs assistance for any activities, wears diapers -Patient tends to lie on her left buttock -daughter reports noticed a small wound on the L buttock near the lateral to the gluteal cleft this morning with a little bright red blood from the area today, denies odor or pus -daughter reports pt fell of the cough about a week ago; she did not notice any wounds, swelling or trauma to the buttocks or hips at that time and denies any further injury. However, reports pt has had a bruise like lesion on the R and today has a swollen bump on the L lateral hip that looks a little bruise but is not red or warm to touch. Reports she move patient to her other side and now the swelling is resolving. -daughter denies fever, change in status, patient acting as if has discomfort or is in pain -they are not able to get a blood pressure on her at home -Patient is a palliative care patient, daughter called them today after setting up this visit as well about  and they will be coming to evaluate her for this at her home tomorrow -she is very reluctant to take patient out as reports is very difficult to move her    ROS: See pertinent positives and negatives per HPI.  Past Medical History:  Diagnosis Date  . Arthritis    "knees, shoulders" (12/29/2015)  .  Atrophic gastritis   . Cancer of left breast ([place]) 1989  . CKD (chronic kidney disease), stage III   . Diverticulosis of colon   . GERD (gastroesophageal reflux disease)   . Glaucoma   . Headache    "awful headaches when I was coming up in my teens"  . Hypertension   . Lactose intolerance   . Orthostatic hypotension   . Pernicious anemia   . Vitamin B 12 deficiency     Past Surgical History:  Procedure Laterality Date  . BALLOON DILATION N/A 06/28/2014   Procedure: BALLOON DILATION;  Surgeon: Ladene Artist, MD;  Location: [facility];  Service: Endoscopy;  Laterality: N/A;  . BOTOX INJECTION N/A 07/09/2013   Procedure: BOTOX INJECTION;  Surgeon: Ladene Artist, MD;  Location: [facility];  Service: Endoscopy;  Laterality: N/A;  . BOTOX INJECTION N/A 06/28/2014   Procedure: BOTOX INJECTION;  Surgeon: Ladene Artist, MD;  Location: [facility];  Service: Endoscopy;  Laterality: N/A;  . CATARACT EXTRACTION W/ INTRAOCULAR LENS  IMPLANT, BILATERAL Bilateral 08-2015-10/2015  . ESOPHAGEAL MANOMETRY N/A 10/27/2012   Procedure: ESOPHAGEAL MANOMETRY (EM);  Surgeon: Ladene Artist, MD;  Location: [facility];  Service: Endoscopy;  Laterality: N/A;  . ESOPHAGOGASTRODUODENOSCOPY N/A 07/09/2013   Procedure: ESOPHAGOGASTRODUODENOSCOPY (EGD);  Surgeon: Ladene Artist, MD;  Location: [facility];  Service: Endoscopy;  Laterality: N/A;  . ESOPHAGOGASTRODUODENOSCOPY N/A 10/07/2013   Procedure: ESOPHAGOGASTRODUODENOSCOPY (EGD);  Surgeon: Ladene Artist, MD;  Location: [facility];  Service: Endoscopy;  Laterality: N/A;  wtih botox  . ESOPHAGOGASTRODUODENOSCOPY (EGD) WITH PROPOFOL N/A 06/28/2014   Procedure: ESOPHAGOGASTRODUODENOSCOPY (EGD) WITH PROPOFOL;  Surgeon: Ladene Artist, MD;  Location: [facility];  Service: Endoscopy;  Laterality: N/A;  . MASTECTOMY Left 1989  . ORIF FEMUR FRACTURE Left 06/16/2018   Procedure: OPEN REDUCTION INTERNAL FIXATION (ORIF) DISTAL FEMUR FRACTURE;  Surgeon: Leandrew Koyanagi, MD;  Location: [facility];  Service: Orthopedics;  Laterality: Left;    Family History  Problem Relation Age of Onset  . Hypertension Mother        family hx  . Lung cancer Father        family hx  . Stroke Other        family hx  . Heart disease Sister        family hx    SOCIAL HX: see hpi   Current Outpatient Medications:  .  acetaminophen (TYLENOL) 500 MG tablet, Take 500 mg by mouth every 6 (six) hours as needed (for pain). , Disp: , Rfl:  .  Ascorbic Acid (VITAMIN C PO), Take 1 tablet by mouth at bedtime. , Disp: , Rfl:  .  BIOTIN PO, Take 1 tablet by mouth every evening., Disp: , Rfl:  .  Cyanocobalamin (VITAMIN B-12 PO), Take 1 tablet by mouth at bedtime. , Disp: , Rfl:  .  ferrous gluconate (FERGON) 325 MG tablet, Take 325 mg by mouth daily after supper. , Disp: , Rfl:  .  hydrochlorothiazide (MICROZIDE) 12.5 MG capsule, Take 1/2 tab every other day, Disp: 15 capsule, Rfl: 0 .  latanoprost (XALATAN) 0.005 % ophthalmic solution, Place 1 drop into both eyes at bedtime. , Disp: , Rfl: 0 .  midodrine (PROAMATINE) 5 MG tablet, TAKE 1 TABLET(5 MG) BY MOUTH THREE TIMES DAILY WITH MEALS, Disp: 270 tablet, Rfl: 3 .  Multiple Vitamins-Minerals (ONE-A-DAY WOMENS 50 PLUS PO), Take 1 tablet by mouth daily after supper. , Disp: , Rfl:  .  multivitamin-lutein (OCUVITE-LUTEIN) CAPS capsule, Take 1 capsule by mouth daily., Disp: , Rfl:  .  omeprazole (PRILOSEC) 20 MG capsule, Take 1 capsule (20 mg total) by mouth daily., Disp: 90 capsule, Rfl: 3 .  potassium chloride (K-DUR) 10 MEQ tablet, TAKE 1 TABLET(10 MEQ) BY MOUTH DAILY, Disp: 90 tablet, Rfl: 1 .  risperiDONE (RISPERDAL) 0.25 MG tablet, Take 2 tablets (0.5 mg total) by mouth at bedtime., Disp: 180 tablet, Rfl: 1 .  traZODone (DESYREL) 50 MG tablet, Take 1/2 tablet every night, Disp: 15 tablet, Rfl: 5 .  vitamin E 400 UNIT capsule, Take 400 Units by mouth at bedtime. , Disp: , Rfl:   EXAM:  VITALS per patient if  applicable:none  GENERAL: patient is lying in bed and is non-communicative, appears comfortable and does wake up with exam but does not flinch or seem bothered when daughter is examining the area  SKIN:poor video image so unable to evaluate well, but appears to have area of bruise and ? Raised area around the L latera hip, small area of erythema (not able to asses degree of tissue injury due to poor video quality) just L of the gluteal clef tin the upper buttock and larger area of erythema on the R buttock. When daughter gently palpates the ? Raised area pt does not seem uncomfortable, daughter reports is not warm to touch and daughter denies an purulence  MS: lying in bed  PSYCH/NEURO: pleasant and cooperative, no obvious depression or anxiety, speech  and thought processing grossly intact  ASSESSMENT AND PLAN:  Discussed the following assessment and plan:  Skin lesion  Swelling of left hip joint  -we discussed possible serious and likely etiologies, options for evaluation and workup, limitations of telemedicine visit vs in person visit, treatment, treatment risks and precautions. Pt caregiver prefers to treat via telemedicine empirically rather then risking or undertaking an in person visit at this moment. Advised really needs in-person evaluation to determine etiology of the hip issue and degree of skin injury and treatment - discussed possibility of trauma, bruising, broken bone, infection, hematoma, bleeding disorder vs other. Discussed options including UCC, ER - declined. Also offers in person office visit. She declines taking patient out and prefers to wait for evaluation with palliative care in the home tomorrow. Discussed dressing in the interim.Patient caregivier agrees to seek prompt in person care if worsening, new symptoms arise, or if is not improving with treatment.   I discussed the assessment and treatment plan with the patient. The patient was provided an opportunity to ask  questions and all were answered. The patient agreed with the plan and demonstrated an understanding of the instructions.   The patient was advised to call back or seek an in-person evaluation if the symptoms worsen or if the condition fails to improve as anticipated.   Lucretia Kern, DO

## 2019-06-26 DEATH — deceased

## 2020-06-25 DEATH — deceased
